# Patient Record
Sex: Female | Born: 1975 | Race: Black or African American | Hispanic: No | Marital: Single | Smoking: Never smoker
Health system: Southern US, Community
[De-identification: ages and names within clinical notes are randomized; demographics above are authoritative.]

## PROBLEM LIST (undated history)

## (undated) ENCOUNTER — Inpatient Hospital Stay (HOSPITAL_COMMUNITY): Payer: Self-pay

## (undated) DIAGNOSIS — D649 Anemia, unspecified: Secondary | ICD-10-CM

## (undated) DIAGNOSIS — R0600 Dyspnea, unspecified: Secondary | ICD-10-CM

## (undated) DIAGNOSIS — Z789 Other specified health status: Secondary | ICD-10-CM

## (undated) DIAGNOSIS — Z923 Personal history of irradiation: Secondary | ICD-10-CM

## (undated) DIAGNOSIS — Z803 Family history of malignant neoplasm of breast: Secondary | ICD-10-CM

## (undated) DIAGNOSIS — G629 Polyneuropathy, unspecified: Secondary | ICD-10-CM

## (undated) DIAGNOSIS — I10 Essential (primary) hypertension: Secondary | ICD-10-CM

## (undated) DIAGNOSIS — C801 Malignant (primary) neoplasm, unspecified: Secondary | ICD-10-CM

## (undated) HISTORY — DX: Personal history of irradiation: Z92.3

## (undated) HISTORY — DX: Family history of malignant neoplasm of breast: Z80.3

## (undated) HISTORY — PX: CHOLECYSTECTOMY: SHX55

---

## 1999-03-19 ENCOUNTER — Emergency Department (HOSPITAL_COMMUNITY): Admission: EM | Admit: 1999-03-19 | Discharge: 1999-03-19 | Payer: Self-pay | Admitting: Emergency Medicine

## 1999-04-15 ENCOUNTER — Encounter: Admission: RE | Admit: 1999-04-15 | Discharge: 1999-07-14 | Payer: Self-pay | Admitting: Family Medicine

## 1999-07-02 ENCOUNTER — Encounter: Payer: Self-pay | Admitting: Obstetrics & Gynecology

## 1999-07-02 ENCOUNTER — Ambulatory Visit (HOSPITAL_COMMUNITY): Admission: RE | Admit: 1999-07-02 | Discharge: 1999-07-02 | Payer: Self-pay | Admitting: Obstetrics & Gynecology

## 1999-09-18 ENCOUNTER — Ambulatory Visit (HOSPITAL_COMMUNITY): Admission: RE | Admit: 1999-09-18 | Discharge: 1999-09-18 | Payer: Self-pay | Admitting: Obstetrics and Gynecology

## 1999-09-18 ENCOUNTER — Encounter: Payer: Self-pay | Admitting: Obstetrics and Gynecology

## 1999-11-08 ENCOUNTER — Inpatient Hospital Stay (HOSPITAL_COMMUNITY): Admission: AD | Admit: 1999-11-08 | Discharge: 1999-11-08 | Payer: Self-pay | Admitting: Obstetrics and Gynecology

## 1999-11-18 ENCOUNTER — Inpatient Hospital Stay (HOSPITAL_COMMUNITY): Admission: AD | Admit: 1999-11-18 | Discharge: 1999-11-18 | Payer: Self-pay | Admitting: Obstetrics and Gynecology

## 1999-11-22 ENCOUNTER — Inpatient Hospital Stay (HOSPITAL_COMMUNITY): Admission: AD | Admit: 1999-11-22 | Discharge: 1999-11-24 | Payer: Self-pay | Admitting: Obstetrics and Gynecology

## 2000-07-19 ENCOUNTER — Emergency Department (HOSPITAL_COMMUNITY): Admission: EM | Admit: 2000-07-19 | Discharge: 2000-07-20 | Payer: Self-pay | Admitting: *Deleted

## 2000-07-20 ENCOUNTER — Encounter: Payer: Self-pay | Admitting: Emergency Medicine

## 2002-02-28 ENCOUNTER — Emergency Department (HOSPITAL_COMMUNITY): Admission: EM | Admit: 2002-02-28 | Discharge: 2002-02-28 | Payer: Self-pay | Admitting: *Deleted

## 2002-06-17 ENCOUNTER — Emergency Department (HOSPITAL_COMMUNITY): Admission: EM | Admit: 2002-06-17 | Discharge: 2002-06-18 | Payer: Self-pay | Admitting: Emergency Medicine

## 2002-06-17 ENCOUNTER — Encounter: Payer: Self-pay | Admitting: Emergency Medicine

## 2002-11-08 ENCOUNTER — Encounter: Admission: RE | Admit: 2002-11-08 | Discharge: 2002-11-08 | Payer: Self-pay | Admitting: Family Medicine

## 2002-11-08 ENCOUNTER — Encounter: Payer: Self-pay | Admitting: Family Medicine

## 2003-09-02 ENCOUNTER — Emergency Department (HOSPITAL_COMMUNITY): Admission: EM | Admit: 2003-09-02 | Discharge: 2003-09-02 | Payer: Self-pay | Admitting: Emergency Medicine

## 2003-09-05 ENCOUNTER — Emergency Department (HOSPITAL_COMMUNITY): Admission: EM | Admit: 2003-09-05 | Discharge: 2003-09-05 | Payer: Self-pay | Admitting: Emergency Medicine

## 2003-12-05 ENCOUNTER — Emergency Department (HOSPITAL_COMMUNITY): Admission: EM | Admit: 2003-12-05 | Discharge: 2003-12-06 | Payer: Self-pay | Admitting: Emergency Medicine

## 2003-12-08 ENCOUNTER — Emergency Department (HOSPITAL_COMMUNITY): Admission: EM | Admit: 2003-12-08 | Discharge: 2003-12-08 | Payer: Self-pay | Admitting: Emergency Medicine

## 2004-03-19 ENCOUNTER — Emergency Department (HOSPITAL_COMMUNITY): Admission: EM | Admit: 2004-03-19 | Discharge: 2004-03-19 | Payer: Self-pay | Admitting: Family Medicine

## 2004-03-19 ENCOUNTER — Inpatient Hospital Stay (HOSPITAL_COMMUNITY): Admission: AD | Admit: 2004-03-19 | Discharge: 2004-03-19 | Payer: Self-pay | Admitting: *Deleted

## 2004-06-14 ENCOUNTER — Inpatient Hospital Stay (HOSPITAL_COMMUNITY): Admission: EM | Admit: 2004-06-14 | Discharge: 2004-06-16 | Payer: Self-pay | Admitting: *Deleted

## 2004-06-15 ENCOUNTER — Ambulatory Visit: Payer: Self-pay | Admitting: Psychiatry

## 2004-06-16 ENCOUNTER — Emergency Department (HOSPITAL_COMMUNITY): Admission: EM | Admit: 2004-06-16 | Discharge: 2004-06-16 | Payer: Self-pay | Admitting: Emergency Medicine

## 2004-11-05 ENCOUNTER — Emergency Department (HOSPITAL_COMMUNITY): Admission: EM | Admit: 2004-11-05 | Discharge: 2004-11-06 | Payer: Self-pay | Admitting: *Deleted

## 2005-06-27 ENCOUNTER — Emergency Department (HOSPITAL_COMMUNITY): Admission: EM | Admit: 2005-06-27 | Discharge: 2005-06-28 | Payer: Self-pay | Admitting: Emergency Medicine

## 2005-07-13 ENCOUNTER — Emergency Department (HOSPITAL_COMMUNITY): Admission: EM | Admit: 2005-07-13 | Discharge: 2005-07-14 | Payer: Self-pay | Admitting: Emergency Medicine

## 2005-10-01 ENCOUNTER — Other Ambulatory Visit: Admission: RE | Admit: 2005-10-01 | Discharge: 2005-10-01 | Payer: Self-pay | Admitting: Obstetrics and Gynecology

## 2005-11-30 ENCOUNTER — Inpatient Hospital Stay (HOSPITAL_COMMUNITY): Admission: AD | Admit: 2005-11-30 | Discharge: 2005-11-30 | Payer: Self-pay | Admitting: Obstetrics and Gynecology

## 2006-05-17 ENCOUNTER — Emergency Department (HOSPITAL_COMMUNITY): Admission: EM | Admit: 2006-05-17 | Discharge: 2006-05-17 | Payer: Self-pay | Admitting: Emergency Medicine

## 2006-07-12 ENCOUNTER — Inpatient Hospital Stay (HOSPITAL_COMMUNITY): Admission: EM | Admit: 2006-07-12 | Discharge: 2006-07-17 | Payer: Self-pay | Admitting: Emergency Medicine

## 2006-07-14 ENCOUNTER — Encounter (INDEPENDENT_AMBULATORY_CARE_PROVIDER_SITE_OTHER): Payer: Self-pay | Admitting: Specialist

## 2006-08-10 ENCOUNTER — Emergency Department (HOSPITAL_COMMUNITY): Admission: EM | Admit: 2006-08-10 | Discharge: 2006-08-11 | Payer: Self-pay | Admitting: Emergency Medicine

## 2006-09-17 ENCOUNTER — Ambulatory Visit (HOSPITAL_COMMUNITY): Admission: RE | Admit: 2006-09-17 | Discharge: 2006-09-17 | Payer: Self-pay | Admitting: Gastroenterology

## 2007-05-23 ENCOUNTER — Encounter: Payer: Self-pay | Admitting: Emergency Medicine

## 2007-05-24 ENCOUNTER — Observation Stay (HOSPITAL_COMMUNITY): Admission: AD | Admit: 2007-05-24 | Discharge: 2007-05-24 | Payer: Self-pay | Admitting: Obstetrics and Gynecology

## 2007-06-16 ENCOUNTER — Inpatient Hospital Stay (HOSPITAL_COMMUNITY): Admission: AD | Admit: 2007-06-16 | Discharge: 2007-06-16 | Payer: Self-pay | Admitting: Obstetrics and Gynecology

## 2007-07-20 ENCOUNTER — Inpatient Hospital Stay (HOSPITAL_COMMUNITY): Admission: AD | Admit: 2007-07-20 | Discharge: 2007-07-20 | Payer: Self-pay | Admitting: Obstetrics and Gynecology

## 2007-08-08 ENCOUNTER — Inpatient Hospital Stay (HOSPITAL_COMMUNITY): Admission: AD | Admit: 2007-08-08 | Discharge: 2007-08-09 | Payer: Self-pay | Admitting: Obstetrics and Gynecology

## 2007-08-15 ENCOUNTER — Inpatient Hospital Stay (HOSPITAL_COMMUNITY): Admission: AD | Admit: 2007-08-15 | Discharge: 2007-08-16 | Payer: Self-pay | Admitting: Obstetrics and Gynecology

## 2008-09-12 ENCOUNTER — Emergency Department (HOSPITAL_COMMUNITY): Admission: EM | Admit: 2008-09-12 | Discharge: 2008-09-12 | Payer: Self-pay | Admitting: Emergency Medicine

## 2009-04-10 ENCOUNTER — Emergency Department (HOSPITAL_COMMUNITY): Admission: EM | Admit: 2009-04-10 | Discharge: 2009-04-11 | Payer: Self-pay | Admitting: Emergency Medicine

## 2009-11-07 ENCOUNTER — Inpatient Hospital Stay (HOSPITAL_COMMUNITY): Admission: AD | Admit: 2009-11-07 | Discharge: 2009-11-08 | Payer: Self-pay | Admitting: Obstetrics and Gynecology

## 2009-11-16 ENCOUNTER — Inpatient Hospital Stay (HOSPITAL_COMMUNITY): Admission: AD | Admit: 2009-11-16 | Discharge: 2009-11-16 | Payer: Self-pay | Admitting: Pediatrics

## 2009-12-01 ENCOUNTER — Inpatient Hospital Stay (HOSPITAL_COMMUNITY): Admission: AD | Admit: 2009-12-01 | Discharge: 2009-12-02 | Payer: Self-pay | Admitting: Obstetrics and Gynecology

## 2010-04-30 ENCOUNTER — Emergency Department (HOSPITAL_COMMUNITY)
Admission: EM | Admit: 2010-04-30 | Discharge: 2010-05-01 | Payer: Self-pay | Source: Home / Self Care | Admitting: Emergency Medicine

## 2010-06-20 ENCOUNTER — Inpatient Hospital Stay (HOSPITAL_COMMUNITY): Admission: AD | Admit: 2010-06-20 | Discharge: 2009-08-21 | Payer: Self-pay | Admitting: Obstetrics and Gynecology

## 2010-09-30 LAB — CBC
HCT: 33.8 % — ABNORMAL LOW (ref 36.0–46.0)
Hemoglobin: 11.4 g/dL — ABNORMAL LOW (ref 12.0–15.0)
MCHC: 33.9 g/dL (ref 30.0–36.0)
MCV: 91.7 fL (ref 78.0–100.0)
MCV: 92.5 fL (ref 78.0–100.0)
Platelets: 258 10*3/uL (ref 150–400)
RDW: 14.8 % (ref 11.5–15.5)
RDW: 15.1 % (ref 11.5–15.5)
WBC: 9.9 10*3/uL (ref 4.0–10.5)

## 2010-10-01 LAB — HERPES SIMPLEX VIRUS CULTURE

## 2010-10-01 LAB — WET PREP, GENITAL

## 2010-10-01 LAB — GC/CHLAMYDIA PROBE AMP, GENITAL: Chlamydia, DNA Probe: NEGATIVE

## 2010-10-18 LAB — URINE MICROSCOPIC-ADD ON

## 2010-10-18 LAB — LIPASE, BLOOD: Lipase: 30 U/L (ref 11–59)

## 2010-10-18 LAB — DIFFERENTIAL
Basophils Absolute: 0 10*3/uL (ref 0.0–0.1)
Eosinophils Relative: 1 % (ref 0–5)
Lymphocytes Relative: 45 % (ref 12–46)
Neutrophils Relative %: 48 % (ref 43–77)

## 2010-10-18 LAB — COMPREHENSIVE METABOLIC PANEL
AST: 19 U/L (ref 0–37)
Albumin: 3.3 g/dL — ABNORMAL LOW (ref 3.5–5.2)
CO2: 27 mEq/L (ref 19–32)
Chloride: 109 mEq/L (ref 96–112)
GFR calc Af Amer: >60 mL/min (ref >60)
Glucose, Bld: 98 mg/dL (ref 70–99)
Potassium: 3.8 mEq/L (ref 3.5–5.1)

## 2010-10-18 LAB — URINALYSIS, ROUTINE W REFLEX MICROSCOPIC
Bilirubin Urine: NEGATIVE
Ketones, ur: NEGATIVE mg/dL
Nitrite: NEGATIVE
Specific Gravity, Urine: 1.027 (ref 1.005–1.030)
pH: 5.5 (ref 5.0–8.0)

## 2010-10-18 LAB — CBC
HCT: 36.6 % (ref 36.0–46.0)
Hemoglobin: 12 g/dL (ref 12.0–15.0)
RDW: 13.2 % (ref 11.5–15.5)
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
## 2010-11-26 NOTE — Discharge Summary (Signed)
NAMEPARILEE, HALLY                 ACCOUNT NO.:  [id number]   MEDICAL RECORD NO.:  [id number]          PATIENT TYPE:  INP   LOCATION:  9173                          [facility]   PHYSICIAN:  Hal Morales, M.D.DATE OF BIRTH:  [birth year]   DATE OF ADMISSION:  08/08/2007  DATE OF DISCHARGE:  08/09/2007                               DISCHARGE SUMMARY   ADMISSION DIAGNOSES:  1. Intrauterine pregnancy at 37 weeks.  2. Questionable early labor.  3. Negative group B strep.   DISCHARGE DIAGNOSES:  1. Prodromal labor.  2. Lack cervical change.   PROCEDURES:  1. Pitocin augmentation.  2. Epidural anesthesia.   HOSPITAL COURSE:  Ms. Lars Pinks is a 35 year old gravida 4, para 2-0-1-2 at  51 weeks who presented on the evening of August 08, 2007 with uterine  contractions every 3 minutes.  She had lots of vomiting positive fetal  movement.  She denied any headache, visual symptoms, epigastric pain.  Her pregnancy had been remarkable for:  1. Elevated BMI.  2. History of questionable glucose instability before pregnancy but      normal glucose testing with pregnancy.  3. Group B strep negative.   On admission, cervix was 2, 70% vertex and -2 station with contractions  every 2 minutes.  The patient was very uncomfortable, heart rate was  reactive.  Decision was made to admit for labor care.  The patient did  request an epidural, at this time this was placed.  Her cervix was 2-3,  70% vertex and -2 station.  Epidural was placed for comfort.  Subsequently placing epidural, uterine contractions began to diminished  somewhat in quality.  Throughout the night, the patient was followed  closely.  Pitocin augmentation was continued through the night.  She did  not progress past the 2-3 cm, 70% vertex and -2 station, and the fetal  vertex was not well enough applied to allow for artificial rupture.  The  morning of January 26, cervix remained the same with vertex now at -3.  Fetal heart rate  was reactive.  Decision was made to continue Pitocin  and to try to rupture membranes when vertex is better applied.  The rest  of the morning, Pitocin had been up to a max of 18 mA of Pitocin.  The  recommendation was made for Dr. Pennie Rushing to discontinue the Pitocin  epidural secondary no change in labor progress, no evidence of labor and  no evidence of progression of labor in light of 37-1/7 weeks situation.  The patient was disappointed with this, she did understand that her two  previous labors did not require this much time.  The Foley was  discontinued, the epidural was stopped and Pitocin was also stopped.  By  the late afternoon, the patient was having very seldom contractions.  Cervix was still was same at 2-1/2, 60-70% vertex, -2 station.  Uterine  contractions every 3-7 minutes with some irritability, but mild.  Fetal  heart rate was reassuring.  Decision was made to discontinue the Foley  and discharge the patient home.  She ended up leaving  approximately 6:00  p.m.  She had no voided spontaneously at that time.  However, the  patient did prefer to go home.  She was given instructions to call if  she had no spontaneous voids by this evening.  Again, support was given  to the patient for her lack of progress, and the decision to discharge  the patient was reviewed again with her in light of the risks of trying  to force labor when the cervix was obviously not responding, and the  patient was at 37-1/7 weeks.   DISCHARGE INSTRUCTIONS:  The patient is to continue to monitor for signs  and symptoms of increasing labor, rupture of membranes, vaginal bleeding  or decreased fetal movement.   DISCHARGE MEDICATIONS:  Flexeril 10 mg p.o. q.8 h p.r.n. back pain.  Patient also had Ambien already fused.  Discharge follow-up will occur  of course on p.r.n. basis but the patient also has an appointment  scheduled this Thursday in the office at noon.      Renaldo Reel Emilee Hero,  C.N.M.      Hal Morales, M.D.  Electronically Signed    VLL/MEDQ  D:  08/09/2007  T:  08/10/2007  Job:  [id number]
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
## 2010-11-26 NOTE — H&P (Signed)
Amber White                 ACCOUNT NO.:  [id number]   MEDICAL RECORD NO.:  [id number]          PATIENT TYPE:  INP   LOCATION:  9113                          [facility]  [facility]   PHYSICIAN:  Hal Morales, M.D.DATE OF BIRTH:  12/28/1975   DATE OF ADMISSION:  08/15/2007  DATE OF DISCHARGE:                              HISTORY & PHYSICAL   [name] is a 35 year old, single, black female, gravida 4, para 2-0-1-  2 at 37-6/7th's weeks who presents with regular uterine contractions  every 3 minutes.  She reports a small amount of bloody show.  No  leaking.  No headache.   Her pregnancy has been followed by the [facility]  Certified Nurse Midwife Service and has been remarkable for:  1. Obesity.  2. Mother with breast cancer.  3. Group B strep negative.   PRENATAL LABORATORY:  Were collected on February 02, 2007.  Hemoglobin 11.6,  hematocrit 35.7, platelets 374,000.  Blood type O positive.  Antibody  negative.  Sickle cell trait negative.  RPR nonreactive.  Rubella  immune.  Hepatitis B surface antigen negative.  HIV nonreactive.  Gonorrhea negative.  Chlamydia negative.  One hour Glucola on March 11, 2007, was 147.  Three-hour glucose tolerance test from April 19, 2007,  was within normal limits.  One hour Glucola from May 25, 2007 was  157.  RPR at that time was nonreactive.  Three-hour glucose tolerance  test from June 19, 2007, was within normal limits.  Culture of the  vaginal tract for Group B strep, Gonorrhea, and Chlamydia from July 14, 2007 were all negative.   HISTORY OF PRESENT PREGNANCY:  The patient presented for care at [facility] on February 02, 2007 at 10-2/7th's weeks' gestation.  The patient  had a normal first trimester screening.  She had an elevated early one  hour Glucola and a normal three-hour glucose tolerance test to follow.  Anatomy ultrasound at 19 weeks' gestation shows growth consistent with  previous dating,  confirming an EDC of August 30, 2007.  The patient  was evaluated at 25-1/2 weeks for pressure and pain.  She had a negative  fetal fibronectin, was treated for bacterial vaginosis, and she also had  a urinary tract infection that she was treated for at that visit.  The  patient had a repeat one hour Glucola at 27 weeks that was elevated as  well, followed by a normal three-hour glucose tolerance test.  The rest  of her prenatal care has been unremarkable.   OBSTETRICAL HISTORY:  She is gravida 4, para 2-0-1-2:  1. In January 1996, she had a vaginal delivery of a female infant      weighing 7 pounds and 1 ounce at 40 weeks' gestation after greater      than 24 hours of labor.  She had an epidural for anesthesia.  There      were no complications with that pregnancy or birth.  The infant's      name is Amber White.  2. In May 2001, she had a vaginal delivery of  a female infant weighing      6 pounds and 14 ounces at 38 weeks' gestation after 12 hours of      labor.  She had an epidural for anesthesia.  The infant's name was      Amber White.  There were no complications with that pregnancy or birth.  3. In 2006, she had a spontaneous AB.   PAST MEDICAL HISTORY:  1. She has an allergy to PENICILLIN.  2. She experienced menarche at age 74 with 28-day cycles lasting 3      days.  3. She has used Seasonale and Femcon in the past for contraception.  4. She reports having had the usual childhood illnesses.  5. The patient was diagnosed with type 2 diabetes and takes Metformin      every day until the pregnancy.   PAST SURGICAL HISTORY:  Cholecystectomy in January 2008.   FAMILY MEDICAL HISTORY:  Maternal grandmother with varicose veins.  Maternal grandmother with anemia.  The patient's first cousin on  dialysis.  The patient's mother with breast cancer, currently in  remission.   GENETIC HISTORY:  Remarkable for the patient's mother with cystic  fibrosis.  Maternal aunt with mental  retardation.  Father of the baby's  brother with sickle cell trait.   SOCIAL HISTORY:  The patient is separated.  Father of the baby's name is  Amber White.  The patient has some college education and is employed full  time as a Science writer.  Father of the baby is high school educated and  employed full time as a Estate agent.  They deny any alcohol,  tobacco, or illicit drug use with the pregnancy.   OBJECTIVE DATA:  VITAL SIGNS:  Stable.  She is afebrile.  HEENT:  Grossly within normal limits.  CHEST:  Clear to auscultation.  HEART:  Regular rate and rhythm.  ABDOMEN:  Gravid in contour with fundal height extending approximately  38-cm above the pubic symphysis.  Fetal heart rate is reactive and  reassuring.  Contractions are every 2-4 minutes, lasting 40 seconds.  PELVIC:  Cervix has progressed from 4 to 5-cm, 80% effaced, vertex, -3  and vertex was confirmed by bedside ultrasound.  EXTREMITIES:  Within normal limits.   ASSESSMENT:  1. Intrauterine pregnancy at 37-6/7th's weeks.  2. Early active labor.   PLAN:  1. Admit to birthing suite.  2. Routine CNM orders.  3. The patient plans epidural for labor.  4. Anticipate spontaneous vaginal birth.      Amber White, C.N.M.      Hal Morales, M.D.  Electronically Signed    KS/MEDQ  D:  08/15/2007  T:  08/15/2007  Job:  [id number]

## 2010-11-29 NOTE — Op Note (Signed)
NAMEANSELMA, White                 ACCOUNT NO.:  [id number]   MEDICAL RECORD NO.:  [id number]          PATIENT TYPE:  INP   LOCATION:  1606                         FACILITY:  [facility]   PHYSICIAN:  Amber White. Amber White, M.D.DATE OF BIRTH:  [birth year]   DATE OF PROCEDURE:  07/14/2006  DATE OF DISCHARGE:                               OPERATIVE REPORT   PREOPERATIVE DIAGNOSIS:  Chronic cholecystitis with probable passage of  common duct stone.   POSTOPERATIVE DIAGNOSIS:  Subacute cholecystitis with many common duct  stones.   PROCEDURE:  Laparoscopic cholecystectomy with cholangiogram, removal of  many stones from the common bile duct, but the patient will need an ERCP  tomorrow.   SURGEON:  Amber White. Amber White, M.D.   ASSISTANT:  Amber White. Amber White, M.D.   HISTORY:  Amber White is a 35 year old overweight female who presented  to the emergency room last evening and was seen by Amber White,  M.D. after she had an ultrasound that showed stones within her  gallbladder that was thought not to be acutely inflamed and her liver  function tests were mildly abnormal.  This morning she felt better.  Her  SGOT and SGPT which were approximately 170 and 140 range yesterday, were  mildly improved.  Her bilirubin that was elevated to 1.7 was done.  Amber  C. Madilyn White, M.D. saw her this morning and questioned whether she needed a  preoperative ERCP or whether to proceed with a laparoscopic  cholecystectomy.  I saw her and she was completely asymptomatic, was  hungry.  They obtained a CT this morning and this showed gallstones with  a distended gallbladder and no specific evidence of cholecystitis and  intra and extrahepatic ductal dilatation without a definite cause.  They  could not see any definite stones in the dilated common bile duct, but  they thought the pancreas looked remarkable.  I recommended we proceed  on to the OR for laparoscopic cholecystectomy.  The patient was in  agreement.  She was not febrile and her white count was normal.  She was  given 400 mg of Cipro preoperatively.  The patient is extremely awake  and has type 2 diabetes, but her sugar was not elevated when she  presented to the ER.   DESCRIPTION OF PROCEDURE:  The patient was positioned on the OR table.  She does have PAS stockings.  We did an EKG preoperatively that was  unremarkable and she was kind of hanging over the OR table even though  she was on the bariatric table because of her size.  The abdomen, after  induction of general anesthesia, was prepped with Betadine solution and  draped in a sterile manner.  A small incision was made below the  umbilicus with sharp dissection down through the subcutaneous tissue  identifying the fascia.  This was then picked up between two Kochers and  a small opening made through the anterior thought midline fascia to the  underlying rectus muscle.  It appears that we were to the slight right  of midline and then the posterior rectus fascia was picked  up between  two Hemostats and a small opening made into the peritoneal cavity.  A  pursestring suture was placed.  I then actually put a stitch on the  peritoneum so we could pull it up at the completion for the fascia  closure.  Hasson cannula was introduced.  Carbon dioxide was inserted,  and then the scope was inserted.  We could see the gallbladder.  It was  very tense, but not acutely inflamed.  The upper 10 mm trocar was placed  under direct vision at the appropriate midline position after  anesthetizing the fascia and Dr. Janee White, the assistant, placed the two  lateral 5 mm ports at their appropriate positions.  She does have an  enlarged liver, but it does not look fatty.  We then grasped the  gallbladder and retracted it upward.  She has adhesions around it  proximally and we kind of carefully dissected these and the gallbladder  was so distended, it was kind of a variation of a __________  syndrome  because of the pressure that it is placing on the bile duct.  We  carefully opened up the peritoneum, identified a little peritoneal fold  anterior, opened this, and then I could get around the cystic duct.  I  was impressed on how wide the cystic duct was and was kind of tempted to  go ahead and do the cholangiogram, wondering if it could possibly  somehow common bile duct.  We continued freeing up the peritoneum under  direct vision and then could then pull it out and could see a big window  behind it and we were sure of our anatomy.  I then put two clips where  this cystic duct gallbladder joins.  A small opening was made just  proximal.  Bile which appeared to be under pressure did come forth.  Cook catheter was introduced and held in place with a clip and then the  catheter was injected.  There were many stones in the common bile duct  intrahepatic radicals, etc., And it looks like we probably about an inch  length of cystic duct.  I did a second injection, kind of visualized it  and it had to be better, I could see definitely there was flow and dye  going into the common bile duct.  Then we removed the catheter.  I kind  of opened the little opening a little larger and we probably got 25  stones out of the cystic duct that were just popping out because of the  pressure.  After these were removed, either the __________ sucker or the  pooper scooper.  We then placed two clips across the cystic duct.  The  cystic duct was wide  enough that I thought an Endoloop was needed and a  Vicryl Endoloop was placed just under the two clips and then snugged  down.  We divided the cystic duct before the Endoloop was placed and  then could see the definite cystic artery.  Two clips were placed  proximally and one distally and divided and then using the hook  electrocautery removed the inflamed gallbladder from its bed.  Good hemostasis was obtained.  We placed the gallbladder within an  EndoCatch  bag, switched the camera to the upper 10 mm port and pulled the neck of  the bag into the Hasson cannula.  We then kind of opened the bag, pulled  up the neck of the gallbladder into the opening, opened the gallbladder  and I am sure there were more than 500 stones in the gallbladder of  various sizes from about 2-3 mm to some 5-6 mm.  After the stones were  kind of removed we could bring the EndoCatch bag out without opening the  fascia.  I then closed the fascia with two figure-of-eight sutures of 0  Vicryl in addition to the pursestring we had placed.  We then  anesthetized the fascia.  Next the gallbladder area was irrigated and  there was no bile spillage.  We could not see any other stones floating.  They were all cholesterol stones.  Then we removed all of the irrigating  fluid.  The 5 mm ports were removed under direct vision.  We then  released the carbon dioxide and removed the upper 10 mm trocar under  direct vision.  The patient will need an ERCP tomorrow and a  sphincterotomy to let the remaining stones pass and I will contact Dr.  Madilyn White who is the [facility] who admitted her.           ______________________________  Amber White. Amber White, M.D.     WJW/MEDQ  D:  07/14/2006  T:  07/15/2006  Job:  [id number]   cc:   Everardo All. Madilyn White, M.D.  Fax: [phone]
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
## 2010-11-29 NOTE — Discharge Summary (Signed)
[name], Amber White                 ACCOUNT NO.:  [id number]   MEDICAL RECORD NO.:  [id number]          PATIENT TYPE:  INP   LOCATION:  1606                         [facility]ACILITY:  [facility]   PHYSICIAN:  James L. Malon Kindle., M.D.DATE OF BIRTH:  1976-05-04   DATE OF ADMISSION:  07/12/2006  DATE OF DISCHARGE:                               DISCHARGE SUMMARY   ADMISSION DIAGNOSES:  1. Abdominal pain.  2. Elevated liver tests.   FINAL DIAGNOSIS:  Cholecystitis with common duct stones.   PROCEDURES:  1. Laparoscopic cholecystectomy with cholangiogram with the finding of      multiple common bile duct stones on January 1 by Dr. [name].  2. ERCP with endoscopic sphincterotomy and stent placement by Dr. Dorena Cookey.  Common duct stones were not able to extracted for unclear      reasons.   HISTORY:  The patient is a 35 year old African-American female who  presented with right upper quadrant pain and slightly elevated liver  tests, the symptoms had been going on for several days prior to her  presenting to the emergency room.  Her evaluation after being admitted  included an ultrasound showing cholelithiasis with ductal dilatation and  hepatic steatosis.  This led to abdominal CT showing cholelithiasis with  suggestion of choledocholithiasis.  The patient went onto laparoscopic  cholecystectomy by Dr. Consuello Bossier which revealed a gallbladder  with stones and multiple common duct stones on intraoperative  cholangiogram.  She subsequently underwent an ERCP with sphincterotomy  by Dr. Dorena Cookey the following day, a large sphincterotomy was placed  but for unclear reasons the stones could not be removed.  The patient  had a stent placed.   The following morning she was still a bit nauseated, we watched her one  more day and advanced her diet.  On the day of discharge she was  markedly improved, tolerating a diet with minimal symptoms and was  afebrile.   DISPOSITION:  The patient  is discharged home on her home medications of  metformin 500 mg daily, omeprazole 40 mg daily, Reglan 10 mg q.i.d.  She  will also be given Ursodiol 300 mg b.i.d. in hopes of dissolving her  gallstones, or softening them up for future removal, and will be given  Tramadol for pain.  She will see Dr. Dorena Cookey in the office in two  weeks, see Dr. Consuello Bossier in the office in two weeks.   Her condition is much improved.  She has been instructed to call  immediately for fever, pain, or any signs of cholangitis.           ______________________________  Llana Aliment. Malon Kindle., M.D.     Waldron Session  D:  07/17/2006  T:  07/17/2006  Job:  [id number]   cc:   Renaye Rakers, M.D.  Fax: [phone]   [name]. [name], M.D.  [address]  [place]  [place] [address]

## 2010-11-29 NOTE — H&P (Signed)
NAMEDANIE, Amber White                 ACCOUNT NO.:  [id number]   MEDICAL RECORD NO.:  [id number]          PATIENT TYPE:  INP   LOCATION:  1606                         [facility]ACILITY[facility]  [facility]   PHYSICIAN:  Shirley Friar, MDDATE OF BIRTH:  08-24-1975   DATE OF ADMISSION:  07/12/2006  DATE OF DISCHARGE:                              HISTORY & PHYSICAL   REQUESTING PHYSICIAN:  Trudi Ida. Denton Lank, M.D.   ADMISSION DIAGNOSIS:  Biliary colic, abdominal pain.   HISTORY OF PRESENT ILLNESS:  A 35 year old black female presented to the  ER with a 1 month history of intermittent epigastric abdominal pain that  radiates to her back and is described as dull.  Initially her pain was  diagnosed as reflux and she was given a proton pump inhibitor without  any benefit.  Last night and today, she had worsened abdominal pain  along with nausea, vomiting and chills and came in for further  evaluation.  She denies any fevers.  She does acknowledge that she was  diagnosed with diabetes 3 months ago and has been treated with metformin  for that.  She had an ultrasound done in the ER which showed gallstones,  sludge and mild distension of the gallbladder with intra and hepatic  biliary dilatation, but no obvious stone in the common bile duct. Her  total bilirubin level was 1.7, ALP 150, AST 177, ALT 140.  She had  normal amylase and lipase at 78 and 38  respectively.  She is afebrile  on presentation with a normal white blood count.  While in the emergency  department, she was treated with IV fluids, pain medicines and  antiemetics but she is being admitted due to recurrent abdominal pain  and vomiting.   PAST MEDICAL HISTORY:  1. Diabetes mellitus type 2.  2. Morbid obesity.  3. History of GERD.   MEDICINES:  1. Metformin 500 mg daily.  2. Omeprazole 40 mg daily.  3. Reglan 10 mg q.i.d.   ALLERGIES:  PENICILLIN causes hives.   FAMILY HISTORY:  Positive for gallstones in her mother.   SOCIAL HISTORY:   She denies cigarettes or alcohol.   REVIEW OF SYSTEMS:  Negative except as stated above.   PHYSICAL EXAM:  VITAL SIGNS:  Temperature 98.4, pulse 92, blood pressure  132/94, O2 sat 97% on room air.  GENERAL:  Alert in no acute distress.  HEENT:  Nonicteric sclerae.  CHEST:  Clear to auscultation anteriorly.  CARDIOVASCULAR:  Regular rate and rhythm.  ABDOMEN:  Epigastric tenderness and right upper quadrant tenderness with  guarding, no rebound, otherwise nontender, soft, nondistended, positive  bowel sounds.  EXTREMITIES:  No edema.   IMPRESSION:  A 35 year old black female presenting with a 1 month  history of intermittent epigastric abdominal pain and recent onset of  nausea, vomiting and chills.  Her history is concerning for common bile  duct stone, versus biliary colic, versus Mirizzi's syndrome. No definite  evidence of acute cholecystitis on her ultrasound.  Will admit the  patient for pain control, antiemetic therapy and IV hydration.  Will  watch her LFTs  closely to decide whether she needs to have ERCP versus  further noninvasive imaging.  If her liver function tests continue to  rise or if she develops fever or leukocytosis then will need to do an  ERCP.  Otherwise will probably need additional imaging by MRCP or CT  scanning to look for a common bile duct stone.  We will give her Zofran  as needed for her nausea and vomiting and morphine as needed for pain.      Shirley Friar, MD  Electronically Signed     VCS/MEDQ  D:  07/12/2006  T:  07/13/2006  Job:  [id number]   cc:   Renaye Rakers, M.D.  Fax: [phone]

## 2010-11-29 NOTE — H&P (Signed)
[name], [name]                 ACCOUNT NO.:  [id number]   MEDICAL RECORD NO.:  [id number]          PATIENT TYPE:  INP   LOCATION:  1606                         [facility]ACILITY:  [facility]   PHYSICIAN:  [name]. Weatherly, M.D.DATE OF BIRTH:  [birth year]   DATE OF ADMISSION:  07/12/2006  DATE OF DISCHARGE:                              HISTORY & PHYSICAL   CHIEF COMPLAINT:  Abdominal pain right upper quadrant.   HISTORY OF PRESENT ILLNESS:  Amber White is a 35 year old overweight  black female who was admitted through the emergency room on the 30th  with recurrent episodes of abdominal pain, epigastric right upper  quadrant.  At this time she had had pain radiating to the chest and  back.  I think she was seen approximately 10 o'clock by Dr. Doy Mince, who was on call for gastroenterology who was consulted.  The  patient had had an ultrasound that showed stones.  She is a type 2  diabetic and overweight, is on oral medications and her liver function  studies in the emergency room showed normal amylase and lipase, her SGOT  and SGPT of 177 and 140.  Her bilirubin was mildly elevated at 1.7.  Her  white count was 6200 and a hematocrit of 37.  Her glucose in the  emergency room was 97 and because of mildly elevated liver function  studies, the gastroenterologists were consulted.  She was admitted by  Dr. Bosie Clos and this morning Dr. Madilyn Fireman saw her and he, with repeat  liver function studies which are now improved and also the patient's  pain appears to have subsided, questioned whether she needed the ERCP or  whether she just needed a laparoscopic cholecystectomy and then  cholangiogram and then an ERCP if there would be abnormalities or stones  in the common bile duct.  He contacted me and I was definitely in  agreement with him that this is most likely a small stones; probably she  has passed a small common duct stone but I would do a cholangiogram at  surgery and not schedule for an  ERCP tomorrow in this improving  condition and improving liver function studies.  The patient is  definitely in agreement that she would like to go ahead and proceed with  surgery.  The risk of ERCP and etc. have been explained to her and  unless it is absolutely necessary, she would prefer not having that  procedure.   On physical exam now she is not acutely tender in the right upper  quadrant.  She is overweight.  She has been n.p.o.  She has been  afebrile the past 24 hours and arrangements will be made to add her to  the OR schedule for this afternoon and we will do a cholangiogram.  She  is presently not on antibiotics.  She is allergic to penicillin, that  causes hives, and we will give her on Cipro on call to the operating  room.  She had a CT this morning since there was some question whether  there was prominence in the head the pancreas.  I think this was ruled  out with the CAT scan of the abdomen and she has now been n.p.o. after  the oral contrast for three or four hours.  The patient understands that  we are planning to do a laparoscopic cholecystectomy, about the  incisions.  We will do a cholangiogram and we will add her onto the OR  schedule for this afternoon and since she is n.p.o.   IMPRESSION:  Chronic cholecystitis with stones, possible passage of a  small common duct stone, morbid obesity and type 2 diabetes.  She also  has a history of esophageal reflux.           ______________________________  [name]. [name], M.D.     WJW/MEDQ  D:  07/14/2006  T:  07/14/2006  Job:  [id number]

## 2010-11-29 NOTE — Op Note (Signed)
NAMESUMMERLYN, FICKEL                 ACCOUNT NO.:  [id number]   MEDICAL RECORD NO.:  [id number]          PATIENT TYPE:  AMB   LOCATION:  ENDO                         FACILITY:  [facility]   PHYSICIAN:  [name] Madilyn Fireman, M.D.    DATE OF BIRTH:  10-12-1975   DATE OF PROCEDURE:  09/17/2006  DATE OF DISCHARGE:                               OPERATIVE REPORT   PROCEDURE:  Endoscopic retrograde cholangiopancreatography with stent  removal, sphincterotomy, and stone extraction.   INDICATIONS FOR PROCEDURE:  The patient with known multiple common bile  duct stones after cholecystectomy.  ERCP done post cholecystectomy three  months ago was unsuccessful in extracting the stones due to multiple  stones and fairly large size.  Stent was placed.  Procedure is to remove  the stent and attempt definitive removal of the common bile duct stones.   PROCEDURE:  The patient was placed in the prone position and placed on  the pulse monitor with continuous low-flow oxygen delivered by nasal  cannula.  She was sedated with 190 mcg of IV Fentanyl and 19 mg IV  Versed.  The Pentax side-viewing endoscope was advanced blindly into the  oropharynx, esophagus, and stomach.  The stomach was somewhat J-shaped,  and it was difficult passing the scope down into the interim and through  the pylorus, but this was eventually accomplished.  The stomach appeared  grossly normal with a moderate amount of bile seen in the stomach.  The  papilla of Vater was located on the medial duodenal wall.  There is the  previously placed plastic stent extending from it.  This was grasped  with a snare and pulled up through the scope.  A cholangiogram was  performed which showed a few mid common bile duct stones with a distal  duct poorly opacifying.  Her sphincterotomy was significantly enlarged,  and the adjustable balloon catheter was advanced first only a short  distance into the duct and inflated dragging out two or three smooth  gold-colored stones.  Two or three more passed spontaneously at that  point.  The balloon catheter was reintroduced multiple times going  slightly higher each time and continuing to remove multiple stones.  On  approximately 15 passes, it was estimated that probably 20 to 25 stones  were eventually removed.  Occlusion of cholangiogram, better  opacification of the distal duct was obtained.  We continued to inflate  the balloon and drag it through the ampulla until no further stones were  seen to pass on three consecutive attempts.  There were still some  questionable filling defects at that point, but it was felt at this  point they were most likely air bubbles.  The scope was then withdrawn,  and the patient returned to the recovery room in stable condition.  She  tolerated the procedure well, and there were no immediate complications.   IMPRESSION:  Multiple common bile duct stones removed after  sphincterotomy.           ______________________________  Everardo All Madilyn Fireman, M.D.     JCH/MEDQ  D:  09/17/2006  T:  09/18/2006  Job:  [id number]   cc:   [name]. [name], M.D.  Renaye Rakers, M.D.

## 2010-11-29 NOTE — Op Note (Signed)
Amber White, Amber White                 ACCOUNT NO.:  [id number]   MEDICAL RECORD NO.:  [id number]          PATIENT TYPE:  INP   LOCATION:  1606                         FACILITY:  [facility]   PHYSICIAN:  [name] Madilyn Fireman, M.D.    DATE OF BIRTH:  June 11, 1976   DATE OF PROCEDURE:  07/15/2006  DATE OF DISCHARGE:                               OPERATIVE REPORT   PROCEDURE:  Endoscopic retrograde cholangiopancreatography with  sphincterotomy and stent placement.   INDICATIONS FOR PROCEDURE:  Biliary colic with laparoscopic  cholecystectomy showing multiple gallstones, cystic duct and common bile  duct stones.   DESCRIPTION OF PROCEDURE:  The patient was placed in the left lateral  decubitus position and placed on the pulse monitor with continuous low-  flow oxygen delivered by nasal cannula.  She was sedated with 150 mcg IV  fentanyl and 15 mg IV Versed.  The Olympus video side-viewing endoscope  was advanced blindly into the oropharynx, esophagus, and stomach.  The  stomach was grossly normal with the pylorus traversing and the papilla  Vater located on the medial duodenal wall.  It had a somewhat generous  appearance; and was selectively cannulated with the Wilson-Cook  sphincterotome.  A cholangiogram was obtained.  This showed moderately  dilated, inter-and-extra hepatic ducts with multiple stones apparently  in the cystic duct as well.   A very large sphincterotomy was made.  After that the graded 9-15 mm  balloon catheter was advanced to various levels in the duct and  inflated; there seemed to be an area of resistance somewhat near the  cystic duct takeoff where I could not pull the balloon through the  common bile duct, even when only inflated to 9 mm.  This was persistent  over multiple attempts; and, thus, I could not pull any stones down from  the common hepatic duct or above.  I was able to inflate the balloon to  about 10-12 mm distal to the cystic duct takeoff; and there appeared to  be stones distal to that area too; however, they would not pass with the  balloon; and the balloon would not come through the duct unless deflated  to about 6 mm.  This was tired on multiple occasions; and on several  occasions the wire fell out of the duct; and I could not regain access  with the balloon catheter alone, requiring repeated reintroduction of  the sphincterotome to regain access.  Finally after multiple  unsuccessful attempts to deliver stones, I elected to place a 5-cm, 10-  [place] plastic catheter; and this was done with good positioning under  fluoroscopy.  The scope was then withdrawn and the patient returned to  the recovery room in stable condition.  She tolerated the procedure  well; and there were no immediate complications.   IMPRESSION:  1. 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  2. There may be some component of common bile duct stricturing,      limiting the success of today's procedure.   PLAN:  We  will keep stent in place for 2-4 months, consider a trial of  Actigall and repeat ERCP with attempted definitive removal of the  stones.           ______________________________  Everardo All Madilyn Fireman, M.D.     [facility]/MEDQ  D:  07/15/2006  T:  07/15/2006  Job:  [id number]   cc:   [name]. [name], M.D.  [address]  [place]  [place] [address]

## 2011-01-28 ENCOUNTER — Emergency Department (HOSPITAL_COMMUNITY)
Admission: EM | Admit: 2011-01-28 | Discharge: 2011-01-28 | Disposition: A | Discharge status: Home / Self Care | Payer: No Typology Code available for payment source | Attending: Emergency Medicine | Admitting: Emergency Medicine

## 2011-01-28 DIAGNOSIS — S335XXA Sprain of ligaments of lumbar spine, initial encounter: Secondary | ICD-10-CM | POA: Insufficient documentation

## 2011-01-28 DIAGNOSIS — M545 Low back pain, unspecified: Secondary | ICD-10-CM | POA: Insufficient documentation

## 2011-01-31 ENCOUNTER — Inpatient Hospital Stay (INDEPENDENT_AMBULATORY_CARE_PROVIDER_SITE_OTHER)
Admission: RE | Admit: 2011-01-31 | Discharge: 2011-01-31 | Disposition: A | Discharge status: Home / Self Care | Payer: No Typology Code available for payment source | Source: Ambulatory Visit | Attending: Family Medicine | Admitting: Family Medicine

## 2011-01-31 DIAGNOSIS — S139XXA Sprain of joints and ligaments of unspecified parts of neck, initial encounter: Secondary | ICD-10-CM

## 2011-01-31 DIAGNOSIS — S335XXA Sprain of ligaments of lumbar spine, initial encounter: Secondary | ICD-10-CM

## 2011-02-09 ENCOUNTER — Inpatient Hospital Stay (INDEPENDENT_AMBULATORY_CARE_PROVIDER_SITE_OTHER)
Admission: RE | Admit: 2011-02-09 | Discharge: 2011-02-09 | Disposition: A | Discharge status: Home / Self Care | Payer: Self-pay | Source: Ambulatory Visit | Attending: Family Medicine | Admitting: Family Medicine

## 2011-02-09 DIAGNOSIS — L089 Local infection of the skin and subcutaneous tissue, unspecified: Secondary | ICD-10-CM

## 2011-02-09 DIAGNOSIS — L732 Hidradenitis suppurativa: Secondary | ICD-10-CM

## 2011-02-12 LAB — CULTURE, ROUTINE-ABSCESS

## 2011-02-18 ENCOUNTER — Encounter (HOSPITAL_COMMUNITY): Payer: Self-pay | Admitting: *Deleted

## 2011-02-18 ENCOUNTER — Inpatient Hospital Stay (HOSPITAL_COMMUNITY)
Admission: AD | Admit: 2011-02-18 | Discharge: 2011-02-18 | Disposition: A | Discharge status: Home / Self Care | Payer: Self-pay | Source: Ambulatory Visit | Attending: Family Medicine | Admitting: Family Medicine

## 2011-02-18 DIAGNOSIS — N390 Urinary tract infection, site not specified: Secondary | ICD-10-CM | POA: Insufficient documentation

## 2011-02-18 DIAGNOSIS — A599 Trichomoniasis, unspecified: Secondary | ICD-10-CM

## 2011-02-18 DIAGNOSIS — R300 Dysuria: Secondary | ICD-10-CM | POA: Insufficient documentation

## 2011-02-18 DIAGNOSIS — A5901 Trichomonal vulvovaginitis: Secondary | ICD-10-CM | POA: Insufficient documentation

## 2011-02-18 HISTORY — DX: Other specified health status: Z78.9

## 2011-02-18 LAB — URINE MICROSCOPIC-ADD ON

## 2011-02-18 LAB — URINALYSIS, ROUTINE W REFLEX MICROSCOPIC
Glucose, UA: NEGATIVE mg/dL
Hgb urine dipstick: NEGATIVE
Protein, ur: NEGATIVE mg/dL
Specific Gravity, Urine: 1.025 (ref 1.005–1.030)

## 2011-02-18 LAB — POCT PREGNANCY, URINE: Preg Test, Ur: NEGATIVE

## 2011-02-18 MED ORDER — SULFAMETHOXAZOLE-TRIMETHOPRIM 800-160 MG PO TABS: 1.0000 | ORAL_TABLET | Freq: Two times a day (BID) | ORAL | Status: AC

## 2011-02-18 MED ORDER — METRONIDAZOLE 500 MG PO TABS: 500.0000 mg | ORAL_TABLET | Freq: Two times a day (BID) | ORAL | Status: AC

## 2011-02-18 NOTE — ED Provider Notes (Signed)
History     CSN: [id number] Arrival date & time: 02/18/2011 12:05 AM  Chief Complaint  Patient presents with  . Dysuria   HPI Amber White is a 35 y.o. AA who presents to MAU with burning with urination that started 2 days ago. Last sexual intercourse 3 days ago. Current sex partner x 7 years. She is not pregnant.     Past Medical History  Diagnosis Date  . No pertinent past medical history     Past Surgical History  Procedure Date  . Cholecystectomy     No family history on file.  History  Substance Use Topics  . Smoking status: Never Smoker   . Smokeless tobacco: Not on file  . Alcohol Use: Yes     occassional     OB History    Grav Para Term Preterm Abortions TAB SAB Ect Mult Living   5 4   1 1    4       Review of Systems  Constitutional: Negative for fever and chills.  Respiratory: Negative.   Genitourinary: Positive for dysuria, frequency and vaginal discharge. Negative for vaginal bleeding and pelvic pain.  Musculoskeletal: Negative for back pain.    Physical Exam  BP 133/76  Pulse 95  Resp 18  Ht 5\' 4"  (1.626 m)  Wt 292 lb 6.4 oz (132.632 kg)  BMI 50.19 kg/m2  LMP 01/28/2011  Physical Exam  Nursing note and vitals reviewed. Constitutional: She is oriented to person, place, and time. She appears well-developed and well-nourished.  HENT:  Head: Normocephalic.  Eyes: EOM are normal.  Neck: Neck supple.  Pulmonary/Chest: Effort normal.  Abdominal: Soft. There is no tenderness.  Genitourinary: Cervix exhibits no motion tenderness. Vaginal discharge found.       Frothy yellow malodorous  discharge. No adnexal tenderness.  Musculoskeletal: Normal range of motion.  Neurological: She is alert and oriented to person, place, and time. No cranial nerve deficit.  Skin: Skin is warm and dry.    ED Course  Procedures  UA = Mod Le, 7-11 WBC's, and trichomonas Wet prep =  Assessment: Trichomonas                        UTI  Plan: Flagyl 500 mg.  Po bid x 7 days          Bactrim DS bid x 3 days          Follow up with [facility]          Return here as needed MDM       Kerrie Buffalo, NP 02/18/11 [phone]

## 2011-02-18 NOTE — Progress Notes (Signed)
Pt having pain and burning with urination x 2 days.  LMP 01/28/2011.

## 2011-02-20 NOTE — ED Provider Notes (Signed)
Chart reviewed and agree with management and plan.  

## 2011-04-03 LAB — URINALYSIS, ROUTINE W REFLEX MICROSCOPIC
Bilirubin Urine: NEGATIVE
Glucose, UA: NEGATIVE
Ketones, ur: NEGATIVE
pH: 6

## 2011-04-03 LAB — URINE MICROSCOPIC-ADD ON

## 2011-04-04 LAB — CBC
HCT: 35.4 — ABNORMAL LOW
Hemoglobin: 10.7 — ABNORMAL LOW
Hemoglobin: 11.9 — ABNORMAL LOW
MCHC: 33.6
MCV: 90.9
Platelets: 294
RBC: 3.82 — ABNORMAL LOW
RBC: 3.5 — ABNORMAL LOW
WBC: 8
WBC: 8.8

## 2011-04-04 LAB — RPR: RPR Ser Ql: NONREACTIVE

## 2011-04-21 LAB — URINALYSIS, ROUTINE W REFLEX MICROSCOPIC
Glucose, UA: NEGATIVE
Hgb urine dipstick: NEGATIVE
Protein, ur: NEGATIVE
pH: 5.5

## 2011-04-22 LAB — FETAL FIBRONECTIN: Fetal Fibronectin: NEGATIVE

## 2011-08-17 ENCOUNTER — Encounter (HOSPITAL_COMMUNITY): Payer: Self-pay | Admitting: *Deleted

## 2011-08-17 ENCOUNTER — Emergency Department (HOSPITAL_COMMUNITY): Payer: Medicaid Other

## 2011-08-17 ENCOUNTER — Emergency Department (HOSPITAL_COMMUNITY)
Admission: EM | Admit: 2011-08-17 | Discharge: 2011-08-17 | Disposition: A | Discharge status: Home / Self Care | Payer: Medicaid Other | Attending: Emergency Medicine | Admitting: Emergency Medicine

## 2011-08-17 DIAGNOSIS — M25561 Pain in right knee: Secondary | ICD-10-CM

## 2011-08-17 DIAGNOSIS — M25569 Pain in unspecified knee: Secondary | ICD-10-CM | POA: Insufficient documentation

## 2011-08-17 DIAGNOSIS — W1809XA Striking against other object with subsequent fall, initial encounter: Secondary | ICD-10-CM | POA: Insufficient documentation

## 2011-08-17 MED ORDER — IBUPROFEN 800 MG PO TABS
800.0000 mg | ORAL_TABLET | Freq: Once | ORAL | Status: AC
  Administered 2011-08-17: 800 mg via ORAL
  Filled 2011-08-17: qty 1

## 2011-08-17 MED ORDER — IBUPROFEN 800 MG PO TABS: 800.0000 mg | ORAL_TABLET | Freq: Three times a day (TID) | ORAL | Status: AC

## 2011-08-17 NOTE — ED Provider Notes (Signed)
History     CSN: [id number]  Arrival date & time 08/17/11  0901   First MD Initiated Contact with Patient 08/17/11 [phone]      Chief Complaint  Patient presents with  . Fall  . Knee Pain    Right    HPI The patient p/w R knee pain.  She actually notes two distinct events that have contributed to her pain.  She had a mechanical fall ~2wk ago onto the knee.  And, last night she slipped and struck the same knee against the running board of her truck.  She has maintained ambulatory capacity, but notes pain w motion and weight bearing.  The pain (sharp/ halting sensation) is worse w flexed knee and minimally better w Vicodin and rest.  No distal n/w/t or any other focal complaints.  Past Medical History  Diagnosis Date  . No pertinent past medical history     Past Surgical History  Procedure Date  . Cholecystectomy     History reviewed. No pertinent family history.  History  Substance Use Topics  . Smoking status: Never Smoker   . Smokeless tobacco: Never Used  . Alcohol Use: Yes     occassional     OB History    Grav Para Term Preterm Abortions TAB SAB Ect Mult Living   5 4   1 1    4       Review of Systems  All other systems reviewed and are negative.    Allergies  Penicillins  Home Medications   Current Outpatient Rx  Name Route Sig Dispense Refill  . DOXYCYCLINE HYCLATE 100 MG PO CAPS Oral Take 100 mg by mouth 2 (two) times daily.      . IBUPROFEN 800 MG PO TABS Oral Take 800 mg by mouth every 8 (eight) hours as needed. For pain     . MEDROXYPROGESTERONE ACETATE 150 MG/ML IM SUSP Intramuscular Inject 150 mg into the muscle every 3 (three) months.      . METHOCARBAMOL 750 MG PO TABS Oral Take 750 mg by mouth every 6 (six) hours as needed. Muscle spasm       BP 128/73  Pulse 87  Temp(Src) 98.5 F (36.9 C) (Oral)  Resp 18  Ht 5\' 4"  (1.626 m)  Wt 278 lb (126.1 kg)  BMI 47.72 kg/m2  SpO2 100%  LMP 08/13/2011  Physical Exam  Nursing note and vitals  reviewed. Constitutional: She is oriented to person, place, and time. She appears well-developed and well-nourished. No distress.  HENT:  Head: Normocephalic and atraumatic.  Eyes: Conjunctivae and EOM are normal.  Cardiovascular: Normal rate and regular rhythm.   Pulmonary/Chest: Effort normal and breath sounds normal. No stridor. No respiratory distress. She has no wheezes.  Musculoskeletal:       Legs: Neurological: She is alert and oriented to person, place, and time. No cranial nerve deficit. She exhibits normal muscle tone. Coordination normal.  Skin: Skin is warm and dry. She is not diaphoretic.  Psychiatric: She has a normal mood and affect.    ED Course  Procedures (including critical care time)  Labs Reviewed - No data to display No results found.   No diagnosis found.  X-rays reviewed by me, no acute findings, mild suprapatellar effusion  MDM  This generally well, if obese female now presents with right knee pain that began following 2 separate traumatic events.  On exam the patient has a stable knee with mild midline tenderness.  The patient's pain is  likely due to contusions, though there is some concern for sprain and/or meniscal injury given her description of a halting sensation with flexion.  These results were discussed with the patient.  She is discharged in stable condition to follow up with orthopedics        Gerhard Munch, MD 08/17/11 [phone]

## 2011-08-17 NOTE — ED Notes (Signed)
MD at bedside. 

## 2011-08-17 NOTE — ED Notes (Signed)
Pt from home c/o right knee pain after slipping getting into her truck last night injuring knee over the "running board".

## 2011-11-04 ENCOUNTER — Inpatient Hospital Stay (HOSPITAL_COMMUNITY)
Admission: AD | Admit: 2011-11-04 | Discharge: 2011-11-04 | Disposition: A | Discharge status: Home / Self Care | Payer: Medicaid Other | Source: Ambulatory Visit | Attending: Obstetrics and Gynecology | Admitting: Obstetrics and Gynecology

## 2011-11-04 ENCOUNTER — Encounter (HOSPITAL_COMMUNITY): Payer: Self-pay | Admitting: *Deleted

## 2011-11-04 DIAGNOSIS — A5901 Trichomonal vulvovaginitis: Secondary | ICD-10-CM | POA: Insufficient documentation

## 2011-11-04 DIAGNOSIS — L293 Anogenital pruritus, unspecified: Secondary | ICD-10-CM | POA: Insufficient documentation

## 2011-11-04 LAB — URINALYSIS, ROUTINE W REFLEX MICROSCOPIC
Nitrite: NEGATIVE
Specific Gravity, Urine: 1.02 (ref 1.005–1.030)
Urobilinogen, UA: 1 mg/dL (ref 0.0–1.0)

## 2011-11-04 LAB — URINE MICROSCOPIC-ADD ON

## 2011-11-04 MED ORDER — METRONIDAZOLE 500 MG PO TABS: 500.0000 mg | ORAL_TABLET | Freq: Two times a day (BID) | ORAL | Status: AC

## 2011-11-04 NOTE — MAU Note (Signed)
PT  GETS  DEPO SHOT- RX FROM [facility]-  LAST SHOT  WAS IN [place].  NOTHING ELSE FOR BIRTH CONTROL

## 2011-11-04 NOTE — MAU Provider Note (Signed)
History     CSN: [id number]  Arrival date & time 11/04/11  0106   None     No chief complaint on file.  HPI Amber White is a 36 y.o. female who presents to MAU for vaginal itching and discharge.The symptoms started one week ago and the patient used over the counter yeast cream that helped some but now the symptoms have returned. Denies abdominal pain. Denies UTI symptoms. Current sex partner x 8 years. Last pap smear last year and was normal. Last Depo Provera was in January. The history was provided by the patient.  Past Medical History  Diagnosis Date  . No pertinent past medical history     Past Surgical History  Procedure Date  . Cholecystectomy     History reviewed. No pertinent family history.  History  Substance Use Topics  . Smoking status: Never Smoker   . Smokeless tobacco: Never Used  . Alcohol Use: Yes     occassional     OB History    Grav Para Term Preterm Abortions TAB SAB Ect Mult Living   5 4   1 1    4       Review of Systems  Constitutional: Negative for fever, chills, diaphoresis and fatigue.  HENT: Negative for ear pain, congestion, sore throat, facial swelling, neck pain, neck stiffness, dental problem and sinus pressure.   Eyes: Negative for photophobia, pain and discharge.  Respiratory: Negative for cough, chest tightness and wheezing.   Cardiovascular: Negative.   Gastrointestinal: Negative for nausea, vomiting, abdominal pain, diarrhea, constipation and abdominal distention.  Genitourinary: Positive for vaginal discharge. Negative for dysuria, urgency, frequency, flank pain, vaginal bleeding and difficulty urinating.  Musculoskeletal: Negative for myalgias, back pain and gait problem.  Skin: Negative for color change and rash.  Neurological: Negative for dizziness, speech difficulty, weakness, light-headedness, numbness and headaches.  Psychiatric/Behavioral: Negative for confusion and agitation. The patient is not nervous/anxious.      Allergies  Penicillins  Home Medications  No current outpatient prescriptions on file.  BP 126/80  Pulse 92  Temp(Src) 98 F (36.7 C) (Oral)  Resp 20  Ht 5\' 4"  (1.626 m)  Wt 298 lb 6 oz (135.342 kg)  BMI 51.22 kg/m2  LMP 10/26/2011  Physical Exam  Nursing note and vitals reviewed. Constitutional: She is oriented to person, place, and time. She appears well-developed and well-nourished. No distress.  HENT:  Head: Normocephalic.  Eyes: EOM are normal.  Neck: Neck supple.  Cardiovascular: Normal rate.   Pulmonary/Chest: Effort normal.  Abdominal: Soft. There is no tenderness.       Obese   Genitourinary:       External genitalia without lesions. Frothy yellow discharge vaginal vault, cervix with erythema, no CMT, no adnexal tenderness. Unable to palpate uterus due to patient habitus.  Musculoskeletal: Normal range of motion.  Neurological: She is alert and oriented to person, place, and time. No cranial nerve deficit.  Skin: Skin is warm and dry.  Psychiatric: She has a normal mood and affect. Her behavior is normal. Judgment and thought content normal.   Assessment: Trichomonas vaginosis  Plan:  Flagyl Rx   Discussed need for partner treatment   [facility]   Return here as needed.  ED Course  Procedures   MDM

## 2011-11-04 NOTE — MAU Note (Signed)
PT SAYS  THOUGHT SHE HAD YEAST INFECTION- HAD ITCHING AND WHITE D/C - BOUGHT  MED AT [facility]- FINISHED  ON Friday.  THEN ON   Sunday- ITCHING AGAIN.    CCOB  DELIVERED BABIES.  HAS NOT BEEN BACK SINCE 6 WEEK CHECKUP- WHICH WAS 2 YEARS AGO.

## 2011-11-04 NOTE — Discharge Instructions (Signed)
Trichomoniasis  Trichomoniasis is an infection, caused by the Trichomonas organism, that affects both women and men. In women, the outer female genitalia and the vagina are affected. In men, the penis is mainly affected, but the prostate and other reproductive organs can also be involved. Trichomoniasis is a sexually transmitted disease (STD) and is most often passed to another person through sexual contact. The majority of people who get trichomoniasis do so from a sexual encounter and are also at risk for other STDs.  CAUSES    Sexual intercourse with an infected partner.   It can be present in swimming pools or hot tubs.  SYMPTOMS    Abnormal gray-green frothy vaginal discharge in women.   Vaginal itching and irritation in women.   Itching and irritation of the area outside the vagina in women.   Penile discharge with or without pain in males.   Inflammation of the urethra (urethritis), causing painful urination.   Bleeding after sexual intercourse.  RELATED COMPLICATIONS   Pelvic inflammatory disease.   Infection of the uterus (endometritis).   Infertility.   Tubal (ectopic) pregnancy.   It can be associated with other STDs, including gonorrhea and chlamydia, hepatitis B, and HIV.  COMPLICATIONS DURING PREGNANCY   Early (premature) delivery.   Premature rupture of the membranes (PROM).   Low birth weight.  DIAGNOSIS    Visualization of Trichomonas under the microscope from the vagina discharge.   Ph of the vagina greater than 4.5, tested with a test tape.   Trich Rapid Test.   Culture of the organism, but this is not usually needed.   It may be found on a Pap test.   Having a "strawberry cervix,"which means the cervix looks very red like a strawberry.  TREATMENT    You may be given medication to fight the infection. Inform your caregiver if you could be or are pregnant. Some medications used to treat the infection should not be taken during pregnancy.   Over-the-counter medications or  creams to decrease itching or irritation may be recommended.   Your sexual partner will need to be treated if infected.  HOME CARE INSTRUCTIONS    Take all medication prescribed by your caregiver.   Take over-the-counter medication for itching or irritation as directed by your caregiver.   Do not have sexual intercourse while you have the infection.   Do not douche or wear tampons.   Discuss your infection with your partner, as your partner may have acquired the infection from you. Or, your partner may have been the person who transmitted the infection to you.   Have your sex partner examined and treated if necessary.   Practice safe, informed, and protected sex.   See your caregiver for other STD testing.  SEEK MEDICAL CARE IF:    You still have symptoms after you finish the medication.   You have an oral temperature above 102 F (38.9 C).   You develop belly (abdominal) pain.   You have pain when you urinate.   You have bleeding after sexual intercourse.   You develop a rash.   The medication makes you sick or makes you throw up (vomit).  Document Released: 12/24/2000 Document Revised: 06/19/2011 Document Reviewed: 01/19/2009  Exit[employer] Patient Information 2012 [employer].

## 2011-11-04 NOTE — MAU Provider Note (Signed)
Agree with above note.  Ziad Maye 11/04/2011 6:49 AM

## 2011-11-05 LAB — GC/CHLAMYDIA PROBE AMP, GENITAL
Chlamydia, DNA Probe: NEGATIVE
GC Probe Amp, Genital: NEGATIVE

## 2012-04-26 ENCOUNTER — Encounter (HOSPITAL_COMMUNITY): Payer: Self-pay | Admitting: *Deleted

## 2012-04-26 ENCOUNTER — Inpatient Hospital Stay (HOSPITAL_COMMUNITY)
Admission: AD | Admit: 2012-04-26 | Discharge: 2012-04-26 | Disposition: A | Discharge status: Home / Self Care | Payer: Medicaid Other | Source: Ambulatory Visit | Attending: Obstetrics and Gynecology | Admitting: Obstetrics and Gynecology

## 2012-04-26 DIAGNOSIS — A5901 Trichomonal vulvovaginitis: Secondary | ICD-10-CM

## 2012-04-26 DIAGNOSIS — L293 Anogenital pruritus, unspecified: Secondary | ICD-10-CM | POA: Insufficient documentation

## 2012-04-26 DIAGNOSIS — N949 Unspecified condition associated with female genital organs and menstrual cycle: Secondary | ICD-10-CM | POA: Insufficient documentation

## 2012-04-26 LAB — WET PREP, GENITAL: Clue Cells Wet Prep HPF POC: NONE SEEN

## 2012-04-26 MED ORDER — METRONIDAZOLE 500 MG PO TABS: 500.0000 mg | ORAL_TABLET | Freq: Two times a day (BID) | ORAL | Status: DC

## 2012-04-26 NOTE — MAU Note (Signed)
Started vaginal itching Thursday night, into Friday am. Bought OTC monistat without relief and itching has worsened over the last several hours. Noticed clear discharge yesterday and worsening today.

## 2012-04-26 NOTE — MAU Provider Note (Signed)
History     CSN: [id number]  Arrival date and time: 04/26/12 2141   None     Chief Complaint  Patient presents with  . Vaginal Itching  . Vaginal Discharge   HPI Amber White is a 36 y.o. female who presents to MAU with vaginal discharge and itching. The discharge started 4 days ago. She describes the discharge as watery, blood tinged with bad odor. She used OTC yeast cream 4 days ago and symptoms got worse. Sexually active, unprotected sex with partner of 8 years. Has used Depo Provera for birth control but did not get her shot when due this month. Last sexual intercourse one week ago. Hx of trichomonas less than one year ago. Both patient and partner treated. The history was provided by the patient.  OB History    Grav Para Term Preterm Abortions TAB SAB Ect Mult Living   5 4   1 1    4       Past Medical History  Diagnosis Date  . No pertinent past medical history     Past Surgical History  Procedure Date  . Cholecystectomy     Family History  Problem Relation Age of Onset  . Cancer Mother   . Diabetes Maternal Uncle   . Diabetes Maternal Grandmother   . Heart disease Maternal Grandmother     History  Substance Use Topics  . Smoking status: Never Smoker   . Smokeless tobacco: Never Used  . Alcohol Use: Yes     occassional     Allergies:  Allergies  Allergen Reactions  . Penicillins Hives    Prescriptions prior to admission  Medication Sig Dispense Refill  . doxycycline (VIBRAMYCIN) 100 MG capsule Take 100 mg by mouth 2 (two) times daily.        Marland Kitchen ibuprofen (ADVIL,MOTRIN) 800 MG tablet Take 800 mg by mouth as needed. Used for joint pain.      . naproxen sodium (ANAPROX) 220 MG tablet Take 220 mg by mouth 2 (two) times daily with a meal. headache      . DISCONTD: medroxyPROGESTERone (DEPO-PROVERA) 150 MG/ML injection Inject 150 mg into the muscle every 3 (three) months. Last injection was given in January 2013.      Marland Kitchen DISCONTD: methocarbamol  (ROBAXIN) 750 MG tablet Take 750 mg by mouth every 6 (six) hours as needed. Muscle spasm         Review of Systems  Constitutional: Negative for fever, chills and weight loss.  HENT: Negative for ear pain, nosebleeds, congestion, sore throat and neck pain.   Eyes: Negative for blurred vision, double vision, photophobia and pain.  Respiratory: Negative for cough, shortness of breath and wheezing.   Cardiovascular: Negative for chest pain, palpitations and leg swelling.  Gastrointestinal: Negative for heartburn, nausea, vomiting, abdominal pain, diarrhea and constipation.  Genitourinary: Negative for dysuria, urgency and frequency.       Vaginal discharge  Musculoskeletal: Negative for myalgias and back pain.  Skin: Negative for itching and rash.  Neurological: Negative for dizziness, sensory change, speech change, seizures, weakness and headaches.  Endo/Heme/Allergies: Does not bruise/bleed easily.  Psychiatric/Behavioral: Negative for depression. The patient is not nervous/anxious.    Physical Exam   Blood pressure 137/76, pulse 114, temperature 98.7 F (37.1 C), temperature source Oral, resp. rate 20, last menstrual period 04/10/2012.  Physical Exam  Nursing note and vitals reviewed. Constitutional: She is oriented to person, place, and time. She appears well-developed and well-nourished. No distress.  HENT:  Head: Normocephalic and atraumatic.  Eyes: EOM are normal.  Neck: Neck supple.  Cardiovascular: Normal rate.        Tachycardia   Respiratory: Effort normal and breath sounds normal.  GI: Soft. There is no tenderness.  Genitourinary:       External genitalia small infected follicle, left mons. Non tender. Frothy blood tinged discharge vaginal vault. No CMT, no adnexal tenderness. Unable to palpate uterus due to patient habitus.  Musculoskeletal: Normal range of motion.  Neurological: She is alert and oriented to person, place, and time.  Skin: Skin is warm and dry.    Psychiatric: She has a normal mood and affect. Her behavior is normal. Judgment and thought content normal.   Results for orders placed during the hospital encounter of 04/26/12 (from the past 24 hour(s))  WET PREP, GENITAL     Status: Abnormal   Collection Time   04/26/12 10:35 PM      Component Value Range   Yeast Wet Prep HPF POC NONE SEEN  NONE SEEN   Trich, Wet Prep FEW (*) NONE SEEN   Clue Cells Wet Prep HPF POC NONE SEEN  NONE SEEN   WBC, Wet Prep HPF POC TOO NUMEROUS TO COUNT (*) NONE SEEN   Assessment: 36 y.o. female with vaginal discharge   Trichomonas vaginosis  Plan:  Flagyl   Discussed need for partner treatment   Follow up with [facility] Discussed with the patient and all questioned fully answered. She will return if any problems arise.   Medication List     As of 04/26/2012 11:11 PM    START taking these medications         metroNIDAZOLE 500 MG tablet   Commonly known as: FLAGYL   Take 1 tablet (500 mg total) by mouth 2 (two) times daily.      CONTINUE taking these medications         doxycycline 100 MG capsule   Commonly known as: VIBRAMYCIN      ibuprofen 800 MG tablet   Commonly known as: ADVIL,MOTRIN      naproxen sodium 220 MG tablet   Commonly known as: ANAPROX      STOP taking these medications         medroxyPROGESTERone 150 MG/ML injection   Commonly known as: DEPO-PROVERA      methocarbamol 750 MG tablet   Commonly known as: ROBAXIN          Where to get your medications    These are the prescriptions that you need to pick up.   You may get these medications from any pharmacy.         metroNIDAZOLE 500 MG tablet           MAU Course  Procedures  NEESE,HOPE, RN, FNP, [facility] 04/26/2012, 10:23 PM

## 2012-04-27 LAB — GC/CHLAMYDIA PROBE AMP, GENITAL: GC Probe Amp, Genital: NEGATIVE

## 2012-04-27 NOTE — MAU Provider Note (Signed)
Attestation of Attending Supervision of Advanced Practitioner (CNM/NP): Evaluation and management procedures were performed by the Advanced Practitioner under my supervision and collaboration.  I have reviewed the Advanced Practitioner's note and chart, and I agree with the management and plan.  Maitland Muhlbauer 04/27/2012 5:22 AM

## 2013-02-07 ENCOUNTER — Inpatient Hospital Stay (HOSPITAL_COMMUNITY)
Admission: AD | Admit: 2013-02-07 | Discharge: 2013-02-08 | Disposition: A | Discharge status: Home / Self Care | Payer: Self-pay | Source: Ambulatory Visit | Attending: Family Medicine | Admitting: Family Medicine

## 2013-02-07 DIAGNOSIS — N949 Unspecified condition associated with female genital organs and menstrual cycle: Secondary | ICD-10-CM | POA: Insufficient documentation

## 2013-02-07 DIAGNOSIS — A599 Trichomoniasis, unspecified: Secondary | ICD-10-CM

## 2013-02-07 DIAGNOSIS — L293 Anogenital pruritus, unspecified: Secondary | ICD-10-CM | POA: Insufficient documentation

## 2013-02-07 DIAGNOSIS — A5901 Trichomonal vulvovaginitis: Secondary | ICD-10-CM | POA: Insufficient documentation

## 2013-02-08 ENCOUNTER — Encounter (HOSPITAL_COMMUNITY): Payer: Self-pay | Admitting: *Deleted

## 2013-02-08 DIAGNOSIS — A599 Trichomoniasis, unspecified: Secondary | ICD-10-CM

## 2013-02-08 LAB — WET PREP, GENITAL: Clue Cells Wet Prep HPF POC: NONE SEEN

## 2013-02-08 LAB — URINALYSIS, ROUTINE W REFLEX MICROSCOPIC
Bilirubin Urine: NEGATIVE
Ketones, ur: 15 mg/dL — AB
Nitrite: NEGATIVE
pH: 6 (ref 5.0–8.0)

## 2013-02-08 LAB — URINE MICROSCOPIC-ADD ON

## 2013-02-08 LAB — GC/CHLAMYDIA PROBE AMP: GC Probe RNA: NEGATIVE

## 2013-02-08 MED ORDER — METRONIDAZOLE 500 MG PO TABS
2000.0000 mg | ORAL_TABLET | Freq: Once | ORAL | Status: AC
  Administered 2013-02-08: 2000 mg via ORAL
  Filled 2013-02-08: qty 4

## 2013-02-08 NOTE — MAU Provider Note (Signed)
History     CSN: [id number]  Arrival date and time: 02/07/13 2353   First Provider Initiated Contact with Patient 02/08/13 0025      Chief Complaint  Patient presents with  . Vaginal Itching  . Vaginal Discharge   HPI  Amber White is a 37 y.o. [phone] who is here today because she is certain that she has trichomoniasis. She has vaginal discharge with odor and she  "just knows that is what it is".   Past Medical History  Diagnosis Date  . No pertinent past medical history     Past Surgical History  Procedure Laterality Date  . Cholecystectomy      Family History  Problem Relation Age of Onset  . Cancer Mother   . Diabetes Maternal Uncle   . Diabetes Maternal Grandmother   . Heart disease Maternal Grandmother     History  Substance Use Topics  . Smoking status: Never Smoker   . Smokeless tobacco: Never Used  . Alcohol Use: Yes     Comment: occassional     Allergies:  Allergies  Allergen Reactions  . Penicillins Hives    Prescriptions prior to admission  Medication Sig Dispense Refill  . doxycycline (VIBRAMYCIN) 100 MG capsule Take 100 mg by mouth 2 (two) times daily.        Marland Kitchen ibuprofen (ADVIL,MOTRIN) 800 MG tablet Take 800 mg by mouth as needed. Used for joint pain.      . metroNIDAZOLE (FLAGYL) 500 MG tablet Take 1 tablet (500 mg total) by mouth 2 (two) times daily.  14 tablet  0  . naproxen sodium (ANAPROX) 220 MG tablet Take 220 mg by mouth 2 (two) times daily with a meal. headache        ROS Physical Exam   Blood pressure 122/73, pulse 96, temperature 99.8 F (37.7 C), temperature source Oral, resp. rate 18, height 5\' 4"  (1.626 m), weight 144.244 kg (318 lb), last menstrual period 01/19/2013.  Physical Exam  Nursing note and vitals reviewed. Constitutional: She is oriented to person, place, and time. She appears well-developed and well-nourished. No distress.  Cardiovascular: Normal rate.   Respiratory: Effort normal.  GI: Soft. There is no  tenderness.  Genitourinary:   External: no lesion Vagina: small amount of yellow discharge Cervix: pink, smooth, no CMT Uterus: NSSC Adnexa: NT   Neurological: She is alert and oriented to person, place, and time.  Skin: Skin is warm and dry.  Psychiatric: She has a normal mood and affect.    MAU Course  Procedures  Results for orders placed during the hospital encounter of 02/07/13 (from the past 24 hour(s))  URINALYSIS, ROUTINE W REFLEX MICROSCOPIC     Status: Abnormal   Collection Time    02/07/13 11:58 PM      Result Value Range   Color, Urine YELLOW  YELLOW   APPearance CLEAR  CLEAR   Specific Gravity, Urine >1.030 (*) 1.005 - 1.030   pH 6.0  5.0 - 8.0   Glucose, UA NEGATIVE  NEGATIVE mg/dL   Hgb urine dipstick NEGATIVE  NEGATIVE   Bilirubin Urine NEGATIVE  NEGATIVE   Ketones, ur 15 (*) NEGATIVE mg/dL   Protein, ur NEGATIVE  NEGATIVE mg/dL   Urobilinogen, UA 0.2  0.0 - 1.0 mg/dL   Nitrite NEGATIVE  NEGATIVE   Leukocytes, UA MODERATE (*) NEGATIVE  URINE MICROSCOPIC-ADD ON     Status: Abnormal   Collection Time    02/07/13 11:58 PM  Result Value Range   Squamous Epithelial / LPF FEW (*) RARE   WBC, UA 11-20  <3 WBC/hpf   Bacteria, UA FEW (*) RARE   Urine-Other MUCOUS PRESENT    POCT PREGNANCY, URINE     Status: None   Collection Time    02/08/13 12:06 AM      Result Value Range   Preg Test, Ur NEGATIVE  NEGATIVE  WET PREP, GENITAL     Status: Abnormal   Collection Time    02/08/13 12:37 AM      Result Value Range   Yeast Wet Prep HPF POC NONE SEEN  NONE SEEN   Trich, Wet Prep FEW (*) NONE SEEN   Clue Cells Wet Prep HPF POC NONE SEEN  NONE SEEN   WBC, Wet Prep HPF POC MODERATE (*) NONE SEEN     Assessment and Plan   1. Trichomonas    Treated here in MAU Discussed partner treatment FU as needed   Tawnya Crook 02/08/2013, 12:29 AM

## 2013-02-08 NOTE — MAU Provider Note (Signed)
Chart reviewed and agree with management and plan.  

## 2013-02-08 NOTE — MAU Note (Signed)
Patient states she knows she has Trichomoniasis. She has had it before and she is having the same symptoms.

## 2013-02-09 LAB — URINE CULTURE

## 2013-08-17 ENCOUNTER — Inpatient Hospital Stay (HOSPITAL_COMMUNITY)
Admission: AD | Admit: 2013-08-17 | Discharge: 2013-08-17 | Disposition: A | Discharge status: Home / Self Care | Payer: Medicaid Other | Source: Ambulatory Visit | Attending: Obstetrics and Gynecology | Admitting: Obstetrics and Gynecology

## 2013-08-17 ENCOUNTER — Encounter (HOSPITAL_COMMUNITY): Payer: Self-pay | Admitting: *Deleted

## 2013-08-17 DIAGNOSIS — L293 Anogenital pruritus, unspecified: Secondary | ICD-10-CM | POA: Insufficient documentation

## 2013-08-17 DIAGNOSIS — N898 Other specified noninflammatory disorders of vagina: Secondary | ICD-10-CM | POA: Insufficient documentation

## 2013-08-17 DIAGNOSIS — N9089 Other specified noninflammatory disorders of vulva and perineum: Secondary | ICD-10-CM

## 2013-08-17 DIAGNOSIS — N909 Noninflammatory disorder of vulva and perineum, unspecified: Secondary | ICD-10-CM | POA: Insufficient documentation

## 2013-08-17 LAB — URINALYSIS, ROUTINE W REFLEX MICROSCOPIC
Bilirubin Urine: NEGATIVE
GLUCOSE, UA: NEGATIVE mg/dL
Hgb urine dipstick: NEGATIVE
KETONES UR: NEGATIVE mg/dL
NITRITE: NEGATIVE
PROTEIN: NEGATIVE mg/dL
Specific Gravity, Urine: 1.01 (ref 1.005–1.030)
UROBILINOGEN UA: 1 mg/dL (ref 0.0–1.0)
pH: 7 (ref 5.0–8.0)

## 2013-08-17 LAB — WET PREP, GENITAL
Clue Cells Wet Prep HPF POC: NONE SEEN
Trich, Wet Prep: NONE SEEN
Yeast Wet Prep HPF POC: NONE SEEN

## 2013-08-17 LAB — URINE MICROSCOPIC-ADD ON

## 2013-08-17 LAB — POCT PREGNANCY, URINE: PREG TEST UR: NEGATIVE

## 2013-08-17 MED ORDER — NYSTATIN-TRIAMCINOLONE 100000-0.1 UNIT/GM-% EX OINT: 1.0000 "application " | TOPICAL_OINTMENT | Freq: Two times a day (BID) | CUTANEOUS | Status: DC

## 2013-08-17 NOTE — MAU Provider Note (Signed)
Attestation of Attending Supervision of Advanced Practitioner (CNM/NP): Evaluation and management procedures were performed by the Advanced Practitioner under my supervision and collaboration.  I have reviewed the Advanced Practitioner's note and chart, and I agree with the management and plan.  Sumayya Muha 08/17/2013 2:42 PM

## 2013-08-17 NOTE — Discharge Instructions (Signed)
Disorders of the Vulva It is important to know the outside (external) area of the female genitalia to properly examine yourself. The vulva or labia, sometimes also called the lips around the vagina, covers the pubic bone and surrounds the vagina. The larger or outer labia is called the labia majora. The inner or smaller labia is called the labia minora. The clitoris is at the top of the vulva. It is covered by a small area of tissue called the hood. Below the clitoris is the opening of the tube from the bladder, which is the urethral opening. Just below the urethral opening is the opening of the vagina. This area is called the vestibule. Inside the vestibule are bartholin and skene glands that lubricate the outside of the vagina during sexual intercourse. The perineum is the area that lies between the vagina and anus. You should examine your external genitalia once a month just as you do your self breast examination. SIGNS AND SYMPTOMS TO LOOK FOR DURING YOUR EXAMINATION:  Swelling.  Redness.  Bumps.  Blisters.  Black or white areas.  Itching.  Bleeding.  Burning. TYPES OF VULVAR PROBLEMS  Infections.  Yeast (fungus) is the most common infection that causes redness, swelling, itching and a thick white vaginal discharge. Women with diabetes, taking medicines that kill germs (antibiotics) or on birth control pills are at risk for yeast infection. This infection is treated with vaginal creams, suppositories and anti-itch cream for the vulva.  Genital warts (condyloma) are caused by the human papilloma virus. They are raised bumps that can itch, bleed, cause discomfort and sometimes appear in groups like cauliflower. This is a sexually transmitted disease (STD) caused by sexual contact. This can be treated with topical medication, freezing, electrocautery, laser or surgical removal.  Genital herpes is a virus that causes redness, swelling, blisters and ulcers that can cause itching and are  very painful. This is also a STD. There are medications to control the symptoms of herpes, but there is no cure. Once you have it, you keep getting it because the virus stays in your body.  Contact dermatitis. Contact dermatitis is an irritation of the vulva that can cause redness, swelling and itching. It can be caused by:  Perfumed toilet paper.  Deodorants.  Talcum powder.  Hygiene sprays.  Tampons.  Soaps and fabric softeners.  Wearing wet underwear and bathing suits too long.  Spermicide.  Condoms.  Diaphragms.  Poison ivy. Treatment will depend on eliminating the cause and treating the symptoms.  Vulvodynia.  Vulvodynia is "painful vulva." It includes burning, itching, irritation and a feeling of rawness or bruising of the vulva. Treating this problem depends on the cause and diagnosis. Treatment can take a long time.  Vulvar Dystrophy.  Vulvar Dystrophy is a disorder of the skin of the vulva. The skin can be too thick with hard raised patches or too thin skin showing wrinkles. Vulvar dystrophy can cause redness or white patches, itching and burning sensation. A biopsy may be needed to diagnose the problem. Treatment with creams or ointments depends on the diagnosis.  Vulvar Cancer.  Vulvar cancer is usually a form of squamus skin cancer. Other types of vulvar cancer like melenoma (a dark or black, irregular shaped mole that can bleed easily) and adenocarcinoma (red, itchy, scaly area that looks like eczema) are rare. Treatment is usually surgery to remove the cancerous area, the vulva and the lymph nodes in the groin. This can be done with or without radiation and chemotherapy. HOME   CARE INSTRUCTIONS   Look at your vulva and external genital area every month.  Follow and finish all treatment given to you by your caregiver.  Do not use perfumed or scented soaps, detergents, hygiene spray, talcum powder, tampons, douches or toilet paper.  Remove your tampon every 6  hours. Do not leave tampons in overnight.  Wear loose-fitting clothing.  Wear underwear that is cotton.  Avoid spermicidal creams or foams that irritate you. SEEK MEDICAL CARE IF:   You notice changes on your vulva such as redness, swelling, itching, color changes, bleeding, small bumps, blisters or any discomfort.  You develop an abnormal vaginal discharge.  You have painful sexual intercourse.  You notice swelling of the lymph nodes in your groin. Document Released: 12/17/2007 Document Revised: 09/22/2011 Document Reviewed: 09/30/2010 [employer] Patient Information 2014 [employer].  

## 2013-08-17 NOTE — MAU Provider Note (Signed)
History     CSN: [id number]  Arrival date and time: 08/17/13 7846   First Provider Initiated Contact with Patient 08/17/13 1020      Chief Complaint  Patient presents with  . Vaginal Itching   Vaginal Itching    Amber White is a 38 y.o. [phone] who presents today with vaginal odor and irritation. She states that her symptoms have been present for about 4 days. She denies any abnormal uterine bleeding. She denies any pain at this time.   Past Medical History  Diagnosis Date  . No pertinent past medical history     Past Surgical History  Procedure Laterality Date  . Cholecystectomy      Family History  Problem Relation Age of Onset  . Cancer Mother   . Diabetes Maternal Uncle   . Diabetes Maternal Grandmother   . Heart disease Maternal Grandmother     History  Substance Use Topics  . Smoking status: Never Smoker   . Smokeless tobacco: Never Used  . Alcohol Use: Yes     Comment: occassional     Allergies:  Allergies  Allergen Reactions  . Penicillins Hives    No prescriptions prior to admission    ROS Physical Exam   Blood pressure 131/82, pulse 83, temperature 98.7 F (37.1 C), resp. rate 18, last menstrual period 08/03/2013.  Physical Exam  Nursing note and vitals reviewed. Constitutional: She is oriented to person, place, and time. She appears well-developed and well-nourished. No distress.  Cardiovascular: Normal rate.   Respiratory: Effort normal.  GI: Soft. There is no tenderness.  Genitourinary:   External: no lesion Vagina: small amount of white discharge Cervix: pink, smooth, no CMT Uterus: NSSC Adnexa: NT   Neurological: She is alert and oriented to person, place, and time.  Skin: Skin is warm and dry.  Psychiatric: She has a normal mood and affect.    MAU Course  Procedures  Results for orders placed during the hospital encounter of 08/17/13 (from the past 24 hour(s))  URINALYSIS, ROUTINE W REFLEX MICROSCOPIC     Status:  Abnormal   Collection Time    08/17/13  9:25 AM      Result Value Range   Color, Urine YELLOW  YELLOW   APPearance HAZY (*) CLEAR   Specific Gravity, Urine 1.010  1.005 - 1.030   pH 7.0  5.0 - 8.0   Glucose, UA NEGATIVE  NEGATIVE mg/dL   Hgb urine dipstick NEGATIVE  NEGATIVE   Bilirubin Urine NEGATIVE  NEGATIVE   Ketones, ur NEGATIVE  NEGATIVE mg/dL   Protein, ur NEGATIVE  NEGATIVE mg/dL   Urobilinogen, UA 1.0  0.0 - 1.0 mg/dL   Nitrite NEGATIVE  NEGATIVE   Leukocytes, UA TRACE (*) NEGATIVE  URINE MICROSCOPIC-ADD ON     Status: Abnormal   Collection Time    08/17/13  9:25 AM      Result Value Range   Squamous Epithelial / LPF MANY (*) RARE   WBC, UA 0-2  <3 WBC/hpf   Bacteria, UA RARE  RARE  POCT PREGNANCY, URINE     Status: None   Collection Time    08/17/13  9:32 AM      Result Value Range   Preg Test, Ur NEGATIVE  NEGATIVE  WET PREP, GENITAL     Status: Abnormal   Collection Time    08/17/13 10:25 AM      Result Value Range   Yeast Wet Prep HPF POC NONE SEEN  NONE SEEN   Trich, Wet Prep NONE SEEN  NONE SEEN   Clue Cells Wet Prep HPF POC NONE SEEN  NONE SEEN   WBC, Wet Prep HPF POC MANY (*) NONE SEEN     Assessment and Plan   1. Vulvar irritation      Medication List         nystatin-triamcinolone ointment  Commonly known as:  MYCOLOG  Apply 1 application topically 2 (two) times daily.       Follow-up Information   Follow up with [facility]. (As needed)    Contact information:   [id number] [place] [address] [phone]      Mathis Bud 08/17/2013, 10:29 AM

## 2013-08-17 NOTE — MAU Note (Signed)
Pt presents with complaints of vaginal itching and a clear vaginal discharge that started on Sunday. Denies any vaginal bleeding

## 2013-08-18 LAB — GC/CHLAMYDIA PROBE AMP
CT Probe RNA: NEGATIVE
GC PROBE AMP APTIMA: NEGATIVE

## 2014-02-19 ENCOUNTER — Inpatient Hospital Stay (HOSPITAL_COMMUNITY): Payer: Medicaid Other

## 2014-02-19 ENCOUNTER — Inpatient Hospital Stay (HOSPITAL_COMMUNITY)
Admission: AD | Admit: 2014-02-19 | Discharge: 2014-02-19 | Disposition: A | Discharge status: Home / Self Care | Payer: Medicaid Other | Source: Ambulatory Visit | Attending: Obstetrics & Gynecology | Admitting: Obstetrics & Gynecology

## 2014-02-19 ENCOUNTER — Encounter (HOSPITAL_COMMUNITY): Payer: Self-pay | Admitting: *Deleted

## 2014-02-19 DIAGNOSIS — R51 Headache: Secondary | ICD-10-CM | POA: Insufficient documentation

## 2014-02-19 DIAGNOSIS — O99019 Anemia complicating pregnancy, unspecified trimester: Secondary | ICD-10-CM

## 2014-02-19 DIAGNOSIS — O219 Vomiting of pregnancy, unspecified: Secondary | ICD-10-CM

## 2014-02-19 DIAGNOSIS — D271 Benign neoplasm of left ovary: Secondary | ICD-10-CM

## 2014-02-19 DIAGNOSIS — Z3490 Encounter for supervision of normal pregnancy, unspecified, unspecified trimester: Secondary | ICD-10-CM

## 2014-02-19 DIAGNOSIS — O99891 Other specified diseases and conditions complicating pregnancy: Secondary | ICD-10-CM | POA: Insufficient documentation

## 2014-02-19 DIAGNOSIS — D649 Anemia, unspecified: Secondary | ICD-10-CM | POA: Insufficient documentation

## 2014-02-19 DIAGNOSIS — O9989 Other specified diseases and conditions complicating pregnancy, childbirth and the puerperium: Principal | ICD-10-CM

## 2014-02-19 LAB — URINALYSIS, ROUTINE W REFLEX MICROSCOPIC
Bilirubin Urine: NEGATIVE
Glucose, UA: NEGATIVE mg/dL
HGB URINE DIPSTICK: NEGATIVE
Ketones, ur: NEGATIVE mg/dL
Nitrite: NEGATIVE
Protein, ur: NEGATIVE mg/dL
SPECIFIC GRAVITY, URINE: 1.02 (ref 1.005–1.030)
UROBILINOGEN UA: 4 mg/dL — AB (ref 0.0–1.0)
pH: 6 (ref 5.0–8.0)

## 2014-02-19 LAB — COMPREHENSIVE METABOLIC PANEL
ALT: 14 U/L (ref 0–35)
AST: 15 U/L (ref 0–37)
Albumin: 3.3 g/dL — ABNORMAL LOW (ref 3.5–5.2)
Alkaline Phosphatase: 56 U/L (ref 39–117)
Anion gap: 12 (ref 5–15)
BUN: 7 mg/dL (ref 6–23)
CALCIUM: 9.1 mg/dL (ref 8.4–10.5)
CHLORIDE: 105 meq/L (ref 96–112)
CO2: 23 meq/L (ref 19–32)
Creatinine, Ser: 0.71 mg/dL (ref 0.50–1.10)
GFR calc Af Amer: >90 mL/min (ref >90)
GLUCOSE: 99 mg/dL (ref 70–99)
Potassium: 4.2 mEq/L (ref 3.7–5.3)
SODIUM: 140 meq/L (ref 137–147)
Total Bilirubin: 0.4 mg/dL (ref 0.3–1.2)
Total Protein: 7.6 g/dL (ref 6.0–8.3)

## 2014-02-19 LAB — WET PREP, GENITAL
Clue Cells Wet Prep HPF POC: NONE SEEN
Trich, Wet Prep: NONE SEEN
YEAST WET PREP: NONE SEEN

## 2014-02-19 LAB — CBC
HCT: 33.9 % — ABNORMAL LOW (ref 36.0–46.0)
HEMOGLOBIN: 11.3 g/dL — AB (ref 12.0–15.0)
MCH: 29.3 pg (ref 26.0–34.0)
MCHC: 33.3 g/dL (ref 30.0–36.0)
MCV: 87.8 fL (ref 78.0–100.0)
PLATELETS: 335 10*3/uL (ref 150–400)
RBC: 3.86 MIL/uL — AB (ref 3.87–5.11)
RDW: 14.3 % (ref 11.5–15.5)
WBC: 8.2 10*3/uL (ref 4.0–10.5)

## 2014-02-19 LAB — HIV ANTIBODY (ROUTINE TESTING W REFLEX): HIV 1&2 Ab, 4th Generation: NONREACTIVE

## 2014-02-19 LAB — ABO/RH: ABO/RH(D): O POS

## 2014-02-19 LAB — URINE MICROSCOPIC-ADD ON

## 2014-02-19 LAB — HCG, QUANTITATIVE, PREGNANCY: hCG, Beta Chain, Quant, S: 7443 m[IU]/mL — ABNORMAL HIGH (ref <5)

## 2014-02-19 LAB — POCT PREGNANCY, URINE: Preg Test, Ur: POSITIVE — AB

## 2014-02-19 MED ORDER — METOCLOPRAMIDE HCL 10 MG PO TABS
10.0000 mg | ORAL_TABLET | Freq: Once | ORAL | Status: AC
  Administered 2014-02-19: 10 mg via ORAL
  Filled 2014-02-19: qty 1

## 2014-02-19 MED ORDER — ACETAMINOPHEN 325 MG PO TABS
650.0000 mg | ORAL_TABLET | Freq: Once | ORAL | Status: AC
  Administered 2014-02-19: 650 mg via ORAL
  Filled 2014-02-19: qty 2

## 2014-02-19 MED ORDER — METOCLOPRAMIDE HCL 10 MG PO TABS: 10.0000 mg | ORAL_TABLET | Freq: Four times a day (QID) | ORAL | Status: DC

## 2014-02-19 NOTE — MAU Provider Note (Signed)
History     CSN: [id number]  Arrival date and time: 02/19/14 0945   First Provider Initiated Contact with Patient 02/19/14 1023      Chief Complaint  Patient presents with  . Headache  . Late Period   HPI Comments: Amber White 38 y.o. [id number] presents to MAU with headache and late menses. She has an unsure LMP  Around June 29th making her 5 weeks and 6 days. She states she "feels something moving in there". This is unplanned pregnancy. She denies any pain. She had her last child with [facility] four years ago. She denies hypertension and a review of her past blood pressures show no elevations.    Headache  Associated symptoms include nausea.      Past Medical History  Diagnosis Date  . No pertinent past medical history     Past Surgical History  Procedure Laterality Date  . Cholecystectomy      Family History  Problem Relation Age of Onset  . Cancer Mother   . Diabetes Maternal Uncle   . Diabetes Maternal Grandmother   . Heart disease Maternal Grandmother     History  Substance Use Topics  . Smoking status: Never Smoker   . Smokeless tobacco: Never Used  . Alcohol Use: Yes     Comment: occassional     Allergies:  Allergies  Allergen Reactions  . Penicillins Hives    Prescriptions prior to admission  Medication Sig Dispense Refill  . acetaminophen (TYLENOL) 500 MG tablet Take 1,000 mg by mouth every 6 (six) hours as needed.        Review of Systems  Constitutional: Negative.   Eyes: Negative.   Respiratory: Negative.   Cardiovascular: Negative.   Gastrointestinal: Positive for nausea.  Genitourinary: Negative.        Irregular vaginal menses  Musculoskeletal: Negative.   Skin: Negative.   Neurological: Negative.   Psychiatric/Behavioral: Negative.    Physical Exam   Blood pressure 146/89, pulse 100, temperature 98 F (36.7 C), temperature source Oral, resp. rate 18, height 5' 4.5" (1.638 m), weight 136.261 kg (300 lb 6.4 oz), last menstrual  period 08/03/2013.  Physical Exam  Constitutional: She is oriented to person, place, and time. She appears well-developed and well-nourished. No distress.  Morbid obesity  HENT:  Head: Normocephalic and atraumatic.  Eyes: Pupils are equal, round, and reactive to light.  Cardiovascular: Normal rate, regular rhythm and normal heart sounds.   Respiratory: Effort normal and breath sounds normal.  GI: Soft. Bowel sounds are normal.  Genitourinary:  Genital:External negative Vaginal:small amount white discharge Cervix:closed/ thick Bimanual:uterus does not feel enlarged but difficult due to size   Musculoskeletal: Normal range of motion.  Neurological: She is alert and oriented to person, place, and time.  Skin: Skin is warm and dry.  Psychiatric: She has a normal mood and affect. Her behavior is normal. Judgment and thought content normal.   Results for orders placed during the hospital encounter of 02/19/14 (from the past 24 hour(s))  URINALYSIS, ROUTINE W REFLEX MICROSCOPIC     Status: Abnormal   Collection Time    02/19/14 10:04 AM      Result Value Ref Range   Color, Urine YELLOW  YELLOW   APPearance CLEAR  CLEAR   Specific Gravity, Urine 1.020  1.005 - 1.030   pH 6.0  5.0 - 8.0   Glucose, UA NEGATIVE  NEGATIVE mg/dL   Hgb urine dipstick NEGATIVE  NEGATIVE   Bilirubin Urine NEGATIVE  NEGATIVE   Ketones, ur NEGATIVE  NEGATIVE mg/dL   Protein, ur NEGATIVE  NEGATIVE mg/dL   Urobilinogen, UA 4.0 (*) 0.0 - 1.0 mg/dL   Nitrite NEGATIVE  NEGATIVE   Leukocytes, UA TRACE (*) NEGATIVE  URINE MICROSCOPIC-ADD ON     Status: Abnormal   Collection Time    02/19/14 10:04 AM      Result Value Ref Range   Squamous Epithelial / LPF FEW (*) RARE   WBC, UA 3-6  <3 WBC/hpf   RBC / HPF 0-2  <3 RBC/hpf   Bacteria, UA MANY (*) RARE  POCT PREGNANCY, URINE     Status: Abnormal   Collection Time    02/19/14 10:11 AM      Result Value Ref Range   Preg Test, Ur POSITIVE (*) NEGATIVE  WET PREP,  GENITAL     Status: Abnormal   Collection Time    02/19/14 10:42 AM      Result Value Ref Range   Yeast Wet Prep HPF POC NONE SEEN  NONE SEEN   Trich, Wet Prep NONE SEEN  NONE SEEN   Clue Cells Wet Prep HPF POC NONE SEEN  NONE SEEN   WBC, Wet Prep HPF POC MODERATE (*) NONE SEEN  CBC     Status: Abnormal   Collection Time    02/19/14 11:00 AM      Result Value Ref Range   WBC 8.2  4.0 - 10.5 K/uL   RBC 3.86 (*) 3.87 - 5.11 MIL/uL   Hemoglobin 11.3 (*) 12.0 - 15.0 g/dL   HCT 33.9 (*) 36.0 - 46.0 %   MCV 87.8  78.0 - 100.0 fL   MCH 29.3  26.0 - 34.0 pg   MCHC 33.3  30.0 - 36.0 g/dL   RDW 14.3  11.5 - 15.5 %   Platelets 335  150 - 400 K/uL  COMPREHENSIVE METABOLIC PANEL     Status: Abnormal   Collection Time    02/19/14 11:00 AM      Result Value Ref Range   Sodium 140  137 - 147 mEq/L   Potassium 4.2  3.7 - 5.3 mEq/L   Chloride 105  96 - 112 mEq/L   CO2 23  19 - 32 mEq/L   Glucose, Bld 99  70 - 99 mg/dL   BUN 7  6 - 23 mg/dL   Creatinine, Ser 0.71  0.50 - 1.10 mg/dL   Calcium 9.1  8.4 - 10.5 mg/dL   Total Protein 7.6  6.0 - 8.3 g/dL   Albumin 3.3 (*) 3.5 - 5.2 g/dL   AST 15  0 - 37 U/L   ALT 14  0 - 35 U/L   Alkaline Phosphatase 56  39 - 117 U/L   Total Bilirubin 0.4  0.3 - 1.2 mg/dL   GFR calc non Af Amer >90  >90 mL/min   GFR calc Af Amer >90  >90 mL/min   Anion gap 12  5 - 15  HCG, QUANTITATIVE, PREGNANCY     Status: Abnormal   Collection Time    02/19/14 11:00 AM      Result Value Ref Range   hCG, Beta Chain, Quant, S 7443 (*) <5 mIU/mL  ABO/RH     Status: None   Collection Time    02/19/14 11:00 AM      Result Value Ref Range   ABO/RH(D) O POS     US Ob Comp Less 14 Wks  02/19/2014   CLINICAL  DATA:  Irregular cycles. Quantitative beta HCG is 7,443. Unknown LMP.  EXAM: OBSTETRIC <14 WK [place] AND TRANSVAGINAL OB US  TECHNIQUE: Both transabdominal and transvaginal ultrasound examinations were performed for complete evaluation of the gestation as well as the maternal  uterus, adnexal regions, and pelvic cul-de-sac. Transvaginal technique was performed to assess early pregnancy.  COMPARISON:  None.  FINDINGS: Intrauterine gestational sac: Visualized/normal in shape.  Yolk sac:  Present  Embryo:  Present  Cardiac Activity: Present  Heart Rate:  117 bpm  CRL:   3.7  mm   6 w 1 d                  [place] EDC: 10/14/2014  Maternal uterus/adnexae: No subchorionic hemorrhage identified. Right corpus luteum cyst is present. Echogenic structure in the left ovary is 0.8 x 1.1 x 0.6 cm, consistent with small after dermoid. No free pelvic fluid.  IMPRESSION: 1. Single living intrauterine embryo at 6 weeks 1 day by today's ultrasound. 2. Ultrasound EDC is 10/14/2014. 3. Small left ovarian dermoid 1.1 cm.   Electronically Signed   By: Shon Hale M.D.   On: 02/19/2014 11:59   US Ob Transvaginal  02/19/2014   CLINICAL DATA:  Irregular cycles. Quantitative beta HCG is 7,443. Unknown LMP.  EXAM: OBSTETRIC <14 WK [place] AND TRANSVAGINAL OB US  TECHNIQUE: Both transabdominal and transvaginal ultrasound examinations were performed for complete evaluation of the gestation as well as the maternal uterus, adnexal regions, and pelvic cul-de-sac. Transvaginal technique was performed to assess early pregnancy.  COMPARISON:  None.  FINDINGS: Intrauterine gestational sac: Visualized/normal in shape.  Yolk sac:  Present  Embryo:  Present  Cardiac Activity: Present  Heart Rate:  117 bpm  CRL:   3.7  mm   6 w 1 d                  [place] EDC: 10/14/2014  Maternal uterus/adnexae: No subchorionic hemorrhage identified. Right corpus luteum cyst is present. Echogenic structure in the left ovary is 0.8 x 1.1 x 0.6 cm, consistent with small after dermoid. No free pelvic fluid.  IMPRESSION: 1. Single living intrauterine embryo at 6 weeks 1 day by today's ultrasound. 2. Ultrasound EDC is 10/14/2014. 3. Small left ovarian dermoid 1.1 cm.   Electronically Signed   By: Shon Hale M.D.   On: 02/19/2014 11:59    MAU Course   Procedures  MDM Wet prep, GC, Chlamydia, CBC, UA, U/S, ABORh, Quant, HIV, CMET Tylenol and Reglan for headache and slight nausea Reviewed labs and ultrasound with patient Culture urine/ no sx UTI Assessment and Plan   A: Pregnancy Headache Nausea Anemia  P: Above testing done Advised to start PNV and prenatal care Pregnancy Verification Reglan 10 mg po q6 hrs Return to MAU as needed      Georgia Duff 02/19/2014, 10:50 AM

## 2014-02-19 NOTE — Discharge Instructions (Signed)
Anemia, Nonspecific Anemia is a condition in which the concentration of red blood cells or hemoglobin in the blood is below normal. Hemoglobin is a substance in red blood cells that carries oxygen to the tissues of the body. Anemia results in not enough oxygen reaching these tissues.  CAUSES  Common causes of anemia include:   Excessive bleeding. Bleeding may be internal or external. This includes excessive bleeding from periods (in women) or from the intestine.   Poor nutrition.   Chronic kidney, thyroid, and liver disease.  Bone marrow disorders that decrease red blood cell production.  Cancer and treatments for cancer.  HIV, AIDS, and their treatments.  Spleen problems that increase red blood cell destruction.  Blood disorders.  Excess destruction of red blood cells due to infection, medicines, and autoimmune disorders. SIGNS AND SYMPTOMS   Minor weakness.   Dizziness.   Headache.  Palpitations.   Shortness of breath, especially with exercise.   Paleness.  Cold sensitivity.  Indigestion.  Nausea.  Difficulty sleeping.  Difficulty concentrating. Symptoms may occur suddenly or they may develop slowly.  DIAGNOSIS  Additional blood tests are often needed. These help your health care provider determine the best treatment. Your health care provider will check your stool for blood and look for other causes of blood loss.  TREATMENT  Treatment varies depending on the cause of the anemia. Treatment can include:   Supplements of iron, vitamin W29, or folic acid.   Hormone medicines.   A blood transfusion. This may be needed if blood loss is severe.   Hospitalization. This may be needed if there is significant continual blood loss.   Dietary changes.  Spleen removal. HOME CARE INSTRUCTIONS Keep all follow-up appointments. It often takes many weeks to correct anemia, and having your health care provider check on your condition and your response to  treatment is very important. SEEK IMMEDIATE MEDICAL CARE IF:   You develop extreme weakness, shortness of breath, or chest pain.   You become dizzy or have trouble concentrating.  You develop heavy vaginal bleeding.   You develop a rash.   You have bloody or black, tarry stools.   You faint.   You vomit up blood.   You vomit repeatedly.   You have abdominal pain.  You have a fever or persistent symptoms for more than 2-3 days.   You have a fever and your symptoms suddenly get worse.   You are dehydrated.  MAKE SURE YOU:  Understand these instructions.  Will watch your condition.  Will get help right away if you are not doing well or get worse. Document Released: 08/07/2004 Document Revised: 03/02/2013 Document Reviewed: 12/24/2012 [facility] Patient Information 2015 [place], [place]. This information is not intended to replace advice given to you by your health care provider. Make sure you discuss any questions you have with your health care provider. Nausea and Vomiting Nausea is a sick feeling that often comes before throwing up (vomiting). Vomiting is a reflex where stomach contents come out of your mouth. Vomiting can cause severe loss of body fluids (dehydration). Children and elderly adults can become dehydrated quickly, especially if they also have diarrhea. Nausea and vomiting are symptoms of a condition or disease. It is important to find the cause of your symptoms. CAUSES   Direct irritation of the stomach lining. This irritation can result from increased acid production (gastroesophageal reflux disease), infection, food poisoning, taking certain medicines (such as nonsteroidal anti-inflammatory drugs), alcohol use, or tobacco use.  Signals from  the brain.These signals could be caused by a headache, heat exposure, an inner ear disturbance, increased pressure in the brain from injury, infection, a tumor, or a concussion, pain, emotional stimulus, or metabolic  problems.  An obstruction in the gastrointestinal tract (bowel obstruction).  Illnesses such as diabetes, hepatitis, gallbladder problems, appendicitis, kidney problems, cancer, sepsis, atypical symptoms of a heart attack, or eating disorders.  Medical treatments such as chemotherapy and radiation.  Receiving medicine that makes you sleep (general anesthetic) during surgery. DIAGNOSIS Your caregiver may ask for tests to be done if the problems do not improve after a few days. Tests may also be done if symptoms are severe or if the reason for the nausea and vomiting is not clear. Tests may include:  Urine tests.  Blood tests.  Stool tests.  Cultures (to look for evidence of infection).  X-rays or other imaging studies. Test results can help your caregiver make decisions about treatment or the need for additional tests. TREATMENT You need to stay well hydrated. Drink frequently but in small amounts.You may wish to drink water, sports drinks, clear broth, or eat frozen ice pops or gelatin dessert to help stay hydrated.When you eat, eating slowly may help prevent nausea.There are also some antinausea medicines that may help prevent nausea. HOME CARE INSTRUCTIONS   Take all medicine as directed by your caregiver.  If you do not have an appetite, do not force yourself to eat. However, you must continue to drink fluids.  If you have an appetite, eat a normal diet unless your caregiver tells you differently.  Eat a variety of complex carbohydrates (rice, wheat, potatoes, bread), lean meats, yogurt, fruits, and vegetables.  Avoid high-fat foods because they are more difficult to digest.  Drink enough water and fluids to keep your urine clear or pale yellow.  If you are dehydrated, ask your caregiver for specific rehydration instructions. Signs of dehydration may include:  Severe thirst.  Dry lips and mouth.  Dizziness.  Dark urine.  Decreasing urine frequency and  amount.  Confusion.  Rapid breathing or pulse. SEEK IMMEDIATE MEDICAL CARE IF:   You have blood or brown flecks (like coffee grounds) in your vomit.  You have black or bloody stools.  You have a severe headache or stiff neck.  You are confused.  You have severe abdominal pain.  You have chest pain or trouble breathing.  You do not urinate at least once every 8 hours.  You develop cold or clammy skin.  You continue to vomit for longer than 24 to 48 hours.  You have a fever. MAKE SURE YOU:   Understand these instructions.  Will watch your condition.  Will get help right away if you are not doing well or get worse. Document Released: 06/30/2005 Document Revised: 09/22/2011 Document Reviewed: 11/27/2010 [facility] Patient Information 2015 [place], [place]. This information is not intended to replace advice given to you by your health care provider. Make sure you discuss any questions you have with your health care provider. First Trimester of Pregnancy The first trimester of pregnancy is from week 1 until the end of week 12 (months 1 through 3). During this time, your baby will begin to develop inside you. At 6-8 weeks, the eyes and face are formed, and the heartbeat can be seen on ultrasound. At the end of 12 weeks, all the baby's organs are formed. Prenatal care is all the medical care you receive before the birth of your baby. Make sure you get good prenatal  care and follow all of your doctor's instructions. HOME CARE  Medicines  Take medicine only as told by your doctor. Some medicines are safe and some are not during pregnancy.  Take your prenatal vitamins as told by your doctor.  Take medicine that helps you poop (stool softener) as needed if your doctor says it is okay. Diet  Eat regular, healthy meals.  Your doctor will tell you the amount of weight gain that is right for you.  Avoid raw meat and uncooked cheese.  If you feel sick to your stomach (nauseous) or  throw up (vomit):  Eat 4 or 5 small meals a day instead of 3 large meals.  Try eating a few soda crackers.  Drink liquids between meals instead of during meals.  If you have a hard time pooping (constipation):  Eat high-fiber foods like fresh vegetables, fruit, and whole grains.  Drink enough fluids to keep your pee (urine) clear or pale yellow. Activity and Exercise  Exercise only as told by your doctor. Stop exercising if you have cramps or pain in your lower belly (abdomen) or low back.  Try to avoid standing for long periods of time. Move your legs often if you must stand in one place for a long time.  Avoid heavy lifting.  Wear low-heeled shoes. Sit and stand up straight.  You can have sex unless your doctor tells you not to. Relief of Pain or Discomfort  Wear a good support bra if your breasts are sore.  Take warm water baths (sitz baths) to soothe pain or discomfort caused by hemorrhoids. Use hemorrhoid cream if your doctor says it is okay.  Rest with your legs raised if you have leg cramps or low back pain.  Wear support hose if you have puffy, bulging veins (varicose veins) in your legs. Raise (elevate) your feet for 15 minutes, 3-4 times a day. Limit salt in your diet. Prenatal Care  Schedule your prenatal visits by the twelfth week of pregnancy.  Write down your questions. Take them to your prenatal visits.  Keep all your prenatal visits as told by your doctor. Safety  Wear your seat belt at all times when driving.  Make a list of emergency phone numbers. The list should include numbers for family, friends, the hospital, and police and fire departments. General Tips  Ask your doctor for a referral to a local prenatal class. Begin classes no later than at the start of month 6 of your pregnancy.  Ask for help if you need counseling or help with nutrition. Your doctor can give you advice or tell you where to go for help.  Do not use hot tubs, steam rooms, or  saunas.  Do not douche or use tampons or scented sanitary pads.  Do not cross your legs for long periods of time.  Avoid litter boxes and soil used by cats.  Avoid all smoking, herbs, and alcohol. Avoid drugs not approved by your doctor.  Visit your dentist. At home, brush your teeth with a soft toothbrush. Be gentle when you floss. GET HELP IF:  You are dizzy.  You have mild cramps or pressure in your lower belly.  You have a nagging pain in your belly area.  You continue to feel sick to your stomach, throw up, or have watery poop (diarrhea).  You have a bad smelling fluid coming from your vagina.  You have pain with peeing (urination).  You have increased puffiness (swelling) in your face, hands, legs, or  ankles. GET HELP RIGHT AWAY IF:   You have a fever.  You are leaking fluid from your vagina.  You have spotting or bleeding from your vagina.  You have very bad belly cramping or pain.  You gain or lose weight rapidly.  You throw up blood. It may look like coffee grounds.  You are around people who have [place] measles, fifth disease, or chickenpox.  You have a very bad headache.  You have shortness of breath.  You have any kind of trauma, such as from a fall or a car accident. Document Released: 12/17/2007 Document Revised: 11/14/2013 Document Reviewed: 05/10/2013 [facility] Patient Information 2015 [place], [place]. This information is not intended to replace advice given to you by your health care provider. Make sure you discuss any questions you have with your health care provider.

## 2014-02-20 LAB — CULTURE, OB URINE
Colony Count: 5000
Special Requests: NORMAL

## 2014-02-20 NOTE — MAU Provider Note (Signed)
Attestation of Attending Supervision of Advanced Practitioner (CNM/NP): Evaluation and management procedures were performed by the Advanced Practitioner under my supervision and collaboration. I have reviewed the Advanced Practitioner's note and chart, and I agree with the management and plan.  Carmina Walle H. 6:29 AM

## 2014-02-21 LAB — GC/CHLAMYDIA PROBE AMP
CT PROBE, AMP APTIMA: NEGATIVE
GC Probe RNA: NEGATIVE

## 2014-03-15 ENCOUNTER — Emergency Department (HOSPITAL_COMMUNITY)
Admission: EM | Admit: 2014-03-15 | Discharge: 2014-03-15 | Disposition: A | Discharge status: Home / Self Care | Payer: Medicaid Other | Attending: Emergency Medicine | Admitting: Emergency Medicine

## 2014-03-15 ENCOUNTER — Encounter (HOSPITAL_COMMUNITY): Payer: Self-pay | Admitting: Emergency Medicine

## 2014-03-15 DIAGNOSIS — R21 Rash and other nonspecific skin eruption: Secondary | ICD-10-CM | POA: Insufficient documentation

## 2014-03-15 DIAGNOSIS — Z880 Allergy status to penicillin: Secondary | ICD-10-CM | POA: Insufficient documentation

## 2014-03-15 MED ORDER — PERMETHRIN 5 % EX CREA: TOPICAL_CREAM | CUTANEOUS | Status: DC

## 2014-03-15 MED ORDER — TRIAMCINOLONE ACETONIDE 0.1 % EX CREA: 1.0000 "application " | TOPICAL_CREAM | Freq: Two times a day (BID) | CUTANEOUS | Status: DC

## 2014-03-15 NOTE — ED Notes (Signed)
PA at bedside.

## 2014-03-15 NOTE — ED Notes (Signed)
PT ambulated with baseline gait; VSS; A&Ox3; no signs of distress; respirations even and unlabored; skin warm and dry; no questions upon discharge.  

## 2014-03-15 NOTE — Discharge Instructions (Signed)

## 2014-03-15 NOTE — ED Notes (Signed)
Pt in c/o rash since yesterday, first noted to inside of arms and today noted on torso and thighs, no distress

## 2014-03-15 NOTE — ED Provider Notes (Signed)
CSN: [id number]     Arrival date & time 03/15/14  2102 History  This chart was scribed for Charlann Lange, PA-C working with Francine Graven, DO by Randa Evens, ED Scribe. This patient was seen in room TR08C/TR08C and the patient's care was started at 9:15 PM.    Chief Complaint  Patient presents with  . Rash   Patient is a 38 y.o. female presenting with rash. The history is provided by the patient. No language interpreter was used.  Rash Associated symptoms: no fever    HPI Comments: Amber White is a 38 y.o. female who presents to the Emergency Department complaining of gradually worsening rash onset 3 days prior. She states the rash is on her arms, thighs and torso. She describes the rash as itchy. She states she has been applying hydrocortisone cream with no relief. She state she has been taking benadryl with no relief. She denies fever. She denies any recent contacts with anyone with similar symptoms.    Past Medical History  Diagnosis Date  . No pertinent past medical history    Past Surgical History  Procedure Laterality Date  . Cholecystectomy     Family History  Problem Relation Age of Onset  . Cancer Mother   . Diabetes Maternal Uncle   . Diabetes Maternal Grandmother   . Heart disease Maternal Grandmother    History  Substance Use Topics  . Smoking status: Never Smoker   . Smokeless tobacco: Never Used  . Alcohol Use: Yes     Comment: occassional    OB History   Grav Para Term Preterm Abortions TAB SAB Ect Mult Living   6 4   1 1    4      Review of Systems  Constitutional: Negative for fever.  Skin: Positive for rash.   Allergies  Penicillins  Home Medications   Prior to Admission medications   Medication Sig Start Date End Date Taking? Authorizing Provider  acetaminophen (TYLENOL) 500 MG tablet Take 1,000 mg by mouth every 6 (six) hours as needed.    Historical Provider, MD  metoCLOPramide (REGLAN) 10 MG tablet Take 1 tablet (10 mg total) by mouth  every 6 (six) hours. 02/19/14   Olegario Messier, NP   Triage Vitals: BP 129/74  Pulse 83  Temp(Src) 98.5 F (36.9 C) (Oral)  Resp 18  SpO2 98%  LMP 08/03/2013  Physical Exam  Nursing note and vitals reviewed. Constitutional: She is oriented to person, place, and time. She appears well-developed and well-nourished. No distress.  HENT:  Head: Normocephalic and atraumatic.  Eyes: Conjunctivae and EOM are normal.  Neck: Neck supple. No tracheal deviation present.  Cardiovascular: Normal rate.   Pulmonary/Chest: Effort normal. No respiratory distress.  Musculoskeletal: Normal range of motion.  Neurological: She is alert and oriented to person, place, and time.  Skin: Skin is warm and dry. Rash noted. Rash is maculopapular.  maculopapular that is sparse and located on anticubital upper extremities anterior chest and lower extremities bilateral, no blistering, lesions are pinpoint size and slightly red.  Psychiatric: She has a normal mood and affect. Her behavior is normal.    ED Course  Procedures (including critical care time) DIAGNOSTIC STUDIES: Oxygen Saturation is 98% on RA, normal by my interpretation.    Labs Review Labs Reviewed - No data to display  Imaging Review No results found.   EKG Interpretation None      MDM   Final diagnoses:  None   1. Nonspecific  rash  She has used topical steroids, benadryl without change in symptoms. Differential includes scabies. Rx for Elimte provided but patient cannot afford. Will Rx stronger steroid cream for symptomatic treatment and dermatology referral.    I personally performed the services described in this documentation, which was scribed in my presence. The recorded information has been reviewed and is accurate.       Dewaine Oats, PA-C 03/19/14 [phone]

## 2014-03-19 NOTE — ED Provider Notes (Signed)
Medical screening examination/treatment/procedure(s) were performed by non-physician practitioner and as supervising physician I was immediately available for consultation/collaboration.   EKG Interpretation None        Francine Graven, DO 03/19/14 1625

## 2014-04-09 ENCOUNTER — Inpatient Hospital Stay (HOSPITAL_COMMUNITY)
Admission: AD | Admit: 2014-04-09 | Discharge: 2014-04-09 | Disposition: A | Discharge status: Home / Self Care | Payer: Medicaid Other | Source: Ambulatory Visit | Attending: Obstetrics and Gynecology | Admitting: Obstetrics and Gynecology

## 2014-04-09 ENCOUNTER — Encounter (HOSPITAL_COMMUNITY): Payer: Self-pay

## 2014-04-09 DIAGNOSIS — Z880 Allergy status to penicillin: Secondary | ICD-10-CM | POA: Insufficient documentation

## 2014-04-09 DIAGNOSIS — B3731 Acute candidiasis of vulva and vagina: Secondary | ICD-10-CM | POA: Insufficient documentation

## 2014-04-09 DIAGNOSIS — B373 Candidiasis of vulva and vagina: Secondary | ICD-10-CM

## 2014-04-09 LAB — URINALYSIS, ROUTINE W REFLEX MICROSCOPIC
BILIRUBIN URINE: NEGATIVE
Glucose, UA: NEGATIVE mg/dL
Ketones, ur: 15 mg/dL — AB
NITRITE: NEGATIVE
PH: 5.5 (ref 5.0–8.0)
PROTEIN: NEGATIVE mg/dL
Specific Gravity, Urine: 1.025 (ref 1.005–1.030)
Urobilinogen, UA: 0.2 mg/dL (ref 0.0–1.0)

## 2014-04-09 LAB — WET PREP, GENITAL: TRICH WET PREP: NONE SEEN

## 2014-04-09 LAB — URINE MICROSCOPIC-ADD ON

## 2014-04-09 NOTE — MAU Provider Note (Signed)
History     CSN: [id number]  Arrival date and time: 04/09/14 2224   First Provider Initiated Contact with Patient 04/09/14 2307      Chief Complaint  Patient presents with  . Vaginal Discharge  . Vaginal Itching   Vaginal Discharge The patient's primary symptoms include a vaginal discharge.  Vaginal Itching The patient's primary symptoms include a vaginal discharge.   Amber White is a 38 y.o. [id number] who presents today with vaginal discharge. She states that the discharge started on Friday. She states that it is thick, and very irritating.   Past Medical History  Diagnosis Date  . No pertinent past medical history     Past Surgical History  Procedure Laterality Date  . Cholecystectomy      Family History  Problem Relation Age of Onset  . Cancer Mother   . Diabetes Maternal Uncle   . Diabetes Maternal Grandmother   . Heart disease Maternal Grandmother     History  Substance Use Topics  . Smoking status: Never Smoker   . Smokeless tobacco: Never Used  . Alcohol Use: Yes     Comment: occassional     Allergies:  Allergies  Allergen Reactions  . Penicillins Hives    Prescriptions prior to admission  Medication Sig Dispense Refill  . ciprofloxacin (CIPRO) 500 MG tablet Take 500 mg by mouth 2 (two) times daily.      Marland Kitchen triamcinolone cream (KENALOG) 0.1 % Apply 1 application topically 2 (two) times daily.  30 g  0  . acetaminophen (TYLENOL) 500 MG tablet Take 1,000 mg by mouth every 6 (six) hours as needed.      . metoCLOPramide (REGLAN) 10 MG tablet Take 1 tablet (10 mg total) by mouth every 6 (six) hours.  30 tablet  0  . permethrin (ELIMITE) 5 % cream Apply from the neck down at night and wash off in the morning. May repeat in one week if symptoms persist.  60 g  0    Review of Systems  Genitourinary: Positive for vaginal discharge.   Physical Exam   Blood pressure 116/78, pulse 89, temperature 98.5 F (36.9 C), temperature source Oral, resp. rate 18,  height 5\' 4"  (1.626 m), weight 134.718 kg (297 lb), last menstrual period 03/28/2014, unknown if currently breastfeeding.  Physical Exam  Nursing note and vitals reviewed. Constitutional: She is oriented to person, place, and time. She appears well-developed and well-nourished. No distress.  Cardiovascular: Normal rate.   Respiratory: Effort normal.  GI: Soft. There is no tenderness. There is no rebound.  Genitourinary:   External: no lesion Vagina: large amount of thick, white, adherent discharge Cervix: pink, smooth, no CMT Uterus: NSSC Adnexa: NT   Neurological: She is alert and oriented to person, place, and time.  Skin: Skin is warm and dry.  Psychiatric: She has a normal mood and affect.    MAU Course  Procedures  Results for orders placed during the hospital encounter of 04/09/14 (from the past 24 hour(s))  URINALYSIS, ROUTINE W REFLEX MICROSCOPIC     Status: Abnormal   Collection Time    04/09/14 10:30 PM      Result Value Ref Range   Color, Urine YELLOW  YELLOW   APPearance CLOUDY (*) CLEAR   Specific Gravity, Urine 1.025  1.005 - 1.030   pH 5.5  5.0 - 8.0   Glucose, UA NEGATIVE  NEGATIVE mg/dL   Hgb urine dipstick MODERATE (*) NEGATIVE   Bilirubin Urine NEGATIVE  NEGATIVE   Ketones, ur 15 (*) NEGATIVE mg/dL   Protein, ur NEGATIVE  NEGATIVE mg/dL   Urobilinogen, UA 0.2  0.0 - 1.0 mg/dL   Nitrite NEGATIVE  NEGATIVE   Leukocytes, UA MODERATE (*) NEGATIVE  URINE MICROSCOPIC-ADD ON     Status: Abnormal   Collection Time    04/09/14 10:30 PM      Result Value Ref Range   Squamous Epithelial / LPF FEW (*) RARE   WBC, UA 11-20  <3 WBC/hpf   RBC / HPF 7-10  <3 RBC/hpf   Bacteria, UA FEW (*) RARE   Urine-Other YEAST    WET PREP, GENITAL     Status: Abnormal   Collection Time    04/09/14 11:10 PM      Result Value Ref Range   Yeast Wet Prep HPF POC MODERATE (*) NONE SEEN   Trich, Wet Prep NONE SEEN  NONE SEEN   Clue Cells Wet Prep HPF POC FEW (*) NONE SEEN    WBC, Wet Prep HPF POC MODERATE (*) NONE SEEN    Assessment and Plan   1. Yeast infection involving the vagina and surrounding area    7 ay OTC yeast infection treatment Return to MAU as needed  Follow-up Information   Follow up with [facility]. (As needed)    Specialty:  Internal Medicine   Contact information:   [place] [address] [phone]        Mathis Bud 04/09/2014, 11:14 PM

## 2014-04-09 NOTE — Discharge Instructions (Signed)
USE 7 DAY OVER THE COUNTER YEAST INFECTION TREATMENT. MONISTAT 7   Candidal Vulvovaginitis Candidal vulvovaginitis is an infection of the vagina and vulva. The vulva is the skin around the opening of the vagina. This may cause itching and discomfort in and around the vagina.  HOME CARE  Only take medicine as told by your doctor.  Do not have sex (intercourse) until the infection is healed or as told by your doctor.  Practice safe sex.  Tell your sex partner about your infection.  Do not douche or use tampons.  Wear cotton underwear. Do not wear tight pants or panty hose.  Eat yogurt. This may help treat and prevent yeast infections. GET HELP RIGHT AWAY IF:   You have a fever.  Your problems get worse during treatment or do not get better in 3 days.  You have discomfort, irritation, or itching in your vagina or vulva area.  You have pain after sex.  You start to get belly (abdominal) pain. MAKE SURE YOU:  Understand these instructions.  Will watch your condition.  Will get help right away if you are not doing well or get worse. Document Released: 09/26/2008 Document Revised: 07/05/2013 Document Reviewed: 09/26/2008 [facility] Patient Information 2015 [place], [place]. This information is not intended to replace advice given to you by your health care provider. Make sure you discuss any questions you have with your health care provider.

## 2014-04-09 NOTE — MAU Note (Signed)
Pt c/o greenish discharge that she noticed on Friday. States it itches really bad and has a slight odor. Has some pain with urination and frequency. Denies fever or chills.

## 2014-04-10 NOTE — MAU Provider Note (Signed)
Attestation of Attending Supervision of Advanced Practitioner: Evaluation and management procedures were performed by the PA/NP/CNM/OB Fellow under my supervision/collaboration. Chart reviewed and agree with management and plan.  Alizea Pell V 04/10/2014 6:00 PM

## 2014-05-15 ENCOUNTER — Encounter (HOSPITAL_COMMUNITY): Payer: Self-pay

## 2017-12-09 ENCOUNTER — Emergency Department (HOSPITAL_BASED_OUTPATIENT_CLINIC_OR_DEPARTMENT_OTHER)
Admission: EM | Admit: 2017-12-09 | Discharge: 2017-12-10 | Disposition: A | Discharge status: Home / Self Care | Payer: Self-pay | Attending: Emergency Medicine | Admitting: Emergency Medicine

## 2017-12-09 ENCOUNTER — Encounter (HOSPITAL_BASED_OUTPATIENT_CLINIC_OR_DEPARTMENT_OTHER): Payer: Self-pay | Admitting: Emergency Medicine

## 2017-12-09 ENCOUNTER — Other Ambulatory Visit: Payer: Self-pay

## 2017-12-09 DIAGNOSIS — N898 Other specified noninflammatory disorders of vagina: Secondary | ICD-10-CM | POA: Insufficient documentation

## 2017-12-09 DIAGNOSIS — N760 Acute vaginitis: Secondary | ICD-10-CM | POA: Insufficient documentation

## 2017-12-09 DIAGNOSIS — B9689 Other specified bacterial agents as the cause of diseases classified elsewhere: Secondary | ICD-10-CM | POA: Insufficient documentation

## 2017-12-09 LAB — URINALYSIS, ROUTINE W REFLEX MICROSCOPIC
Bilirubin Urine: NEGATIVE
Glucose, UA: NEGATIVE mg/dL
Hgb urine dipstick: NEGATIVE
Ketones, ur: NEGATIVE mg/dL
Leukocytes, UA: NEGATIVE
NITRITE: NEGATIVE
PH: 5.5 (ref 5.0–8.0)
Protein, ur: NEGATIVE mg/dL

## 2017-12-09 LAB — PREGNANCY, URINE: Preg Test, Ur: NEGATIVE

## 2017-12-09 NOTE — ED Triage Notes (Signed)
Pt reports vaginal itching over the weekend and used Monistat OTC and now has heavy discharge.

## 2017-12-09 NOTE — ED Notes (Signed)
Denies abd pain.

## 2017-12-10 LAB — WET PREP, GENITAL
SPERM: NONE SEEN
TRICH WET PREP: NONE SEEN
Yeast Wet Prep HPF POC: NONE SEEN

## 2017-12-10 LAB — GC/CHLAMYDIA PROBE AMP (~~LOC~~) NOT AT ARMC
Chlamydia: NEGATIVE
Neisseria Gonorrhea: NEGATIVE

## 2017-12-10 MED ORDER — METRONIDAZOLE 500 MG PO TABS
500.0000 mg | ORAL_TABLET | Freq: Once | ORAL | Status: AC
  Administered 2017-12-10: 500 mg via ORAL
  Filled 2017-12-10: qty 1

## 2017-12-10 MED ORDER — METRONIDAZOLE 500 MG PO TABS: 500.0000 mg | ORAL_TABLET | Freq: Two times a day (BID) | ORAL | 0 refills | Status: DC

## 2017-12-10 MED ORDER — FLUCONAZOLE 50 MG PO TABS
150.0000 mg | ORAL_TABLET | Freq: Once | ORAL | Status: AC
  Administered 2017-12-10: 150 mg via ORAL
  Filled 2017-12-10: qty 1

## 2017-12-10 NOTE — ED Provider Notes (Signed)
[facility] Provider Note: Amber Spurling, MD, FACEP  CSN: [id number] MRN: [id number] ARRIVAL: 12/09/17 at [place]: [place]  Vaginal Discharge   HISTORY OF PRESENT ILLNESS  12/10/17 12:14 AM Amber White is a 42 y.o. female with a one-week history of vaginal itching and a white discharge.  She states the itching is severe.  She is tried Monistat 1 dose over-the-counter without relief.  She is not having vaginal bleeding.  She is not having abdominal pain.    Past Medical History:  Diagnosis Date  . No pertinent past medical history     Past Surgical History:  Procedure Laterality Date  . CHOLECYSTECTOMY      Family History  Problem Relation Age of Onset  . Cancer Mother   . Diabetes Maternal Uncle   . Diabetes Maternal Grandmother   . Heart disease Maternal Grandmother     Social History   Tobacco Use  . Smoking status: Never Smoker  . Smokeless tobacco: Never Used  Substance Use Topics  . Alcohol use: Yes    Comment: occassional   . Drug use: No    Prior to Admission medications   Medication Sig Start Date End Date Taking? Authorizing Provider  acetaminophen (TYLENOL) 500 MG tablet Take 1,000 mg by mouth every 6 (six) hours as needed.    [provider]  ciprofloxacin (CIPRO) 500 MG tablet Take 500 mg by mouth 2 (two) times daily.    [provider]  metoCLOPramide (REGLAN) 10 MG tablet Take 1 tablet (10 mg total) by mouth every 6 (six) hours. 02/19/14   Barefoot, Connye Burkitt, NP  permethrin (ELIMITE) 5 % cream Apply from the neck down at night and wash off in the morning. May repeat in one week if symptoms persist. 03/15/14   Charlann Lange, PA-C  triamcinolone cream (KENALOG) 0.1 % Apply 1 application topically 2 (two) times daily. 03/15/14   Charlann Lange, PA-C    Allergies Penicillins   REVIEW OF SYSTEMS  Negative except as noted here or in the History of Present Illness.   PHYSICAL EXAMINATION  Initial  Vital Signs Blood pressure 127/88, pulse 90, temperature 98 F (36.7 C), temperature source Oral, resp. rate 16, height 5\' 4"  (1.626 m), weight 131.5 kg (290 lb), last menstrual period 11/29/2017, SpO2 100 %, unknown if currently breastfeeding.  Examination General: Well-developed, well-nourished female in no acute distress; appearance consistent with age of record HENT: normocephalic; atraumatic Eyes: pupils equal, round and reactive to light; extraocular muscles intact Neck: supple Heart: regular rate and rhythm Lungs: clear to auscultation bilaterally Abdomen: soft; nondistended; nontender; bowel sounds present GU: No CVA tenderness; white vaginal discharge; no vaginal bleeding; no cervical motion tenderness; no adnexal tenderness Extremities: No deformity; full range of motion; pulses normal Neurologic: Awake, alert and oriented; motor function intact in all extremities and symmetric; no facial droop Skin: Warm and dry Psychiatric: Normal mood and affect   RESULTS  Summary of this visit's results, reviewed by myself:   EKG Interpretation  Date/Time:    Ventricular Rate:    PR Interval:    QRS Duration:   QT Interval:    QTC Calculation:   R Axis:     Text Interpretation:        Laboratory Studies: Results for orders placed or performed during the hospital encounter of 12/09/17 (from the past 24 hour(s))  Urinalysis, Routine w reflex microscopic     Status: Abnormal   Collection Time:  12/09/17 11:12 PM  Result Value Ref Range   Color, Urine YELLOW YELLOW   APPearance CLEAR CLEAR   Specific Gravity, Urine >1.030 (H) 1.005 - 1.030   pH 5.5 5.0 - 8.0   Glucose, UA NEGATIVE NEGATIVE mg/dL   Hgb urine dipstick NEGATIVE NEGATIVE   Bilirubin Urine NEGATIVE NEGATIVE   Ketones, ur NEGATIVE NEGATIVE mg/dL   Protein, ur NEGATIVE NEGATIVE mg/dL   Nitrite NEGATIVE NEGATIVE   Leukocytes, UA NEGATIVE NEGATIVE  Pregnancy, urine     Status: None   Collection Time: 12/09/17 11:12  PM  Result Value Ref Range   Preg Test, Ur NEGATIVE NEGATIVE  Wet prep, genital     Status: Abnormal   Collection Time: 12/10/17 12:15 AM  Result Value Ref Range   Yeast Wet Prep HPF POC NONE SEEN NONE SEEN   Trich, Wet Prep NONE SEEN NONE SEEN   Clue Cells Wet Prep HPF POC PRESENT (A) NONE SEEN   WBC, Wet Prep HPF POC MODERATE (A) NONE SEEN   Sperm NONE SEEN    Imaging Studies: No results found.  ED COURSE and MDM  Nursing notes and initial vitals signs, including pulse oximetry, reviewed.  Vitals:   12/09/17 2310  BP: 127/88  Pulse: 90  Resp: 16  Temp: 98 F (36.7 C)  TempSrc: Oral  SpO2: 100%  Weight: 131.5 kg (290 lb)  Height: 5\' 4"  (1.626 m)   12:38 AM Patient symptoms are more consistent with yeast infection.  We will treat with Diflucan.  She has clue cells so we will also treat for bacterial vaginosis.  PROCEDURES    ED DIAGNOSES     ICD-10-CM   1. Vaginal itching N89.8   2. Bacterial vaginosis N76.0    B96.89        Dorie Ohms, Jenny Reichmann, MD 12/10/17 [phone]

## 2018-03-13 ENCOUNTER — Other Ambulatory Visit: Payer: Self-pay

## 2018-03-13 ENCOUNTER — Encounter (HOSPITAL_BASED_OUTPATIENT_CLINIC_OR_DEPARTMENT_OTHER): Payer: Self-pay | Admitting: *Deleted

## 2018-03-13 ENCOUNTER — Emergency Department (HOSPITAL_BASED_OUTPATIENT_CLINIC_OR_DEPARTMENT_OTHER)
Admission: EM | Admit: 2018-03-13 | Discharge: 2018-03-13 | Disposition: A | Discharge status: Home / Self Care | Payer: Self-pay | Attending: Emergency Medicine | Admitting: Emergency Medicine

## 2018-03-13 DIAGNOSIS — L739 Follicular disorder, unspecified: Secondary | ICD-10-CM | POA: Insufficient documentation

## 2018-03-13 MED ORDER — DOXYCYCLINE HYCLATE 100 MG PO CAPS
100.0000 mg | ORAL_CAPSULE | Freq: Two times a day (BID) | ORAL | 0 refills | Status: DC
Start: 2018-03-13 — End: 2019-02-25

## 2018-03-13 NOTE — Discharge Instructions (Signed)
Take the antibiotics as prescribed, you can also try applying over the counter antibacterial ointment, warm soaks to help keep the area clean, follow up with a primary care doctor next week if it is not improving/resolved

## 2018-03-13 NOTE — ED Triage Notes (Signed)
Pt c/o rash to buttocks x 3 days 

## 2018-03-13 NOTE — ED Provider Notes (Signed)
[facility] EMERGENCY DEPARTMENT Provider Note   CSN: [id number] Arrival date & time: 03/13/18  3536     History   Chief Complaint Chief Complaint  Patient presents with  . Rash    HPI Amber White is a 42 y.o. female.  HPI Patient presents to the emergency room for evaluation of a painful rash on her buttocks.  Patient states she noticed it about 3 days ago.  The rash is on the lower aspect primarily on the left buttock.  Patient states she is not really able to see it but it is sore and tender to the touch.  She noticed some blood on her underwear this morning so she came into be evaluated.  Patient states she does have a history of getting painful bumps in her axillary region and this feels somewhat similar.  She denies any fevers or chills.  No vomiting or diarrhea.  No numbness or weakness. Past Medical History:  Diagnosis Date  . No pertinent past medical history     Patient Active Problem List   Diagnosis Date Noted  . Pregnancy 02/19/2014  . Morbid obesity ([place]) 02/19/2014  . Dermoid cyst of left ovary 02/19/2014    Past Surgical History:  Procedure Laterality Date  . CHOLECYSTECTOMY       OB History    Gravida  6   Para  4   Term      Preterm      AB  1   Living  4     SAB      TAB  1   Ectopic      Multiple      Live Births               Home Medications    Prior to Admission medications   Medication Sig Start Date End Date Taking? Authorizing Provider  doxycycline (VIBRAMYCIN) 100 MG capsule Take 1 capsule (100 mg total) by mouth 2 (two) times daily. 03/13/18   Dorie Rank, MD    Family History Family History  Problem Relation Age of Onset  . Cancer Mother   . Diabetes Maternal Uncle   . Diabetes Maternal Grandmother   . Heart disease Maternal Grandmother     Social History Social History   Tobacco Use  . Smoking status: Never Smoker  . Smokeless tobacco: Never Used  Substance Use Topics  . Alcohol use: Yes     Comment: occassional   . Drug use: No     Allergies   Penicillins   Review of Systems Review of Systems  All other systems reviewed and are negative.    Physical Exam Updated Vital Signs BP 131/72   Pulse 88   Temp 98.3 F (36.8 C)   Resp 16   Ht 1.626 m (5\' 4" )   Wt 125.6 kg   LMP 02/26/2018   SpO2 100%   BMI 47.55 kg/m   Physical Exam  Constitutional: She appears well-developed and well-nourished. No distress.  HENT:  Head: Normocephalic and atraumatic.  Right Ear: External ear normal.  Left Ear: External ear normal.  Eyes: Conjunctivae are normal. Right eye exhibits no discharge. Left eye exhibits no discharge. No scleral icterus.  Neck: Neck supple. No tracheal deviation present.  Cardiovascular: Normal rate.  Pulmonary/Chest: Effort normal. No stridor. No respiratory distress.  Abdominal: She exhibits no distension.  Musculoskeletal: She exhibits no edema.  Neurological: She is alert. Cranial nerve deficit: no gross deficits.  Skin: Skin is  warm and dry. Rash noted.  Inferior aspect of left buttock in the skin fold there is a small less than 1 cm area of inflamed skin follicle and superficial ulceration of the skin, evidence of prior scarring in several follicles  Psychiatric: She has a normal mood and affect.  Nursing note and vitals reviewed.    ED Treatments / Results    Procedures Procedures (including critical care time)  Medications Ordered in ED Medications - No data to display   Initial Impression / Assessment and Plan / ED Course  I have reviewed the triage vital signs and the nursing notes.  Pertinent labs & imaging results that were available during my care of the patient were reviewed by me and considered in my medical decision making (see chart for details).     Symptoms are consistent with a small folliculitis.  Recommended warm compresses and topical antibiotics.  I will give her a short course of doxycycline.  Final Clinical  Impressions(s) / ED Diagnoses   Final diagnoses:  Folliculitis    ED Discharge Orders         Ordered    doxycycline (VIBRAMYCIN) 100 MG capsule  2 times daily     03/13/18 9977           Dorie Rank, MD 03/13/18 [phone]

## 2018-03-13 NOTE — ED Notes (Signed)
ED Provider at bedside. 

## 2018-07-30 ENCOUNTER — Other Ambulatory Visit: Payer: Self-pay

## 2018-07-30 ENCOUNTER — Encounter (HOSPITAL_BASED_OUTPATIENT_CLINIC_OR_DEPARTMENT_OTHER): Payer: Self-pay

## 2018-07-30 ENCOUNTER — Emergency Department (HOSPITAL_BASED_OUTPATIENT_CLINIC_OR_DEPARTMENT_OTHER)
Admission: EM | Admit: 2018-07-30 | Discharge: 2018-07-30 | Disposition: A | Discharge status: Home / Self Care | Payer: 59 | Attending: Emergency Medicine | Admitting: Emergency Medicine

## 2018-07-30 DIAGNOSIS — J3489 Other specified disorders of nose and nasal sinuses: Secondary | ICD-10-CM | POA: Diagnosis not present

## 2018-07-30 DIAGNOSIS — H5789 Other specified disorders of eye and adnexa: Secondary | ICD-10-CM | POA: Diagnosis present

## 2018-07-30 DIAGNOSIS — R0981 Nasal congestion: Secondary | ICD-10-CM | POA: Diagnosis not present

## 2018-07-30 DIAGNOSIS — R067 Sneezing: Secondary | ICD-10-CM

## 2018-07-30 DIAGNOSIS — H109 Unspecified conjunctivitis: Secondary | ICD-10-CM | POA: Insufficient documentation

## 2018-07-30 MED ORDER — FLUORESCEIN SODIUM 1 MG OP STRP
1.0000 | ORAL_STRIP | Freq: Once | OPHTHALMIC | Status: AC
  Administered 2018-07-30: 1 via OPHTHALMIC
  Filled 2018-07-30: qty 1

## 2018-07-30 MED ORDER — POLYMYXIN B-TRIMETHOPRIM 10000-0.1 UNIT/ML-% OP SOLN: 2.0000 [drp] | Freq: Four times a day (QID) | OPHTHALMIC | 0 refills | Status: AC

## 2018-07-30 MED ORDER — CETIRIZINE-PSEUDOEPHEDRINE ER 5-120 MG PO TB12: 1.0000 | ORAL_TABLET | Freq: Two times a day (BID) | ORAL | 0 refills | Status: DC

## 2018-07-30 NOTE — Discharge Instructions (Signed)
Use 1 to 2 drops of Polytrim every 6 hours for 7 days.  Take Zyrtec 1-2 times daily for nasal congestion and sneezing.  Please follow-up with the ophthalmologist if your eye symptoms are not improving by Monday.  Please return the emergency department you develop any new or worsening symptoms.

## 2018-07-30 NOTE — ED Triage Notes (Signed)
C/o redness, irritation to right eye x today-NAD-steady gait

## 2018-07-30 NOTE — ED Notes (Signed)
Signature pad in room not working. Pt provided verbal understanding of discharge instructions with teach back.  

## 2018-07-31 NOTE — ED Provider Notes (Signed)
[facility] EMERGENCY DEPARTMENT Provider Note   CSN: [id number] Arrival date & time: 07/30/18  2125     History   Chief Complaint Chief Complaint  Patient presents with  . Conjunctivitis    HPI Amber White is a 43 y.o. female who presents with a 1 day history of right eye redness, nasal congestion and rhinorrhea.  Patient reports she woke up with the eye draining clear fluid.  She denies any vision changes.  She has had some irritation, but no significant pain.  She denies wearing contacts.  She does wear glasses, but does not have them with her.  She denies any trauma to the eye.  She woke up with it red like this.  Later in the day, she developed the URI symptoms with associated sneezing.  She did not take any medications over-the-counter for symptoms.  HPI  Past Medical History:  Diagnosis Date  . No pertinent past medical history     Patient Active Problem List   Diagnosis Date Noted  . Pregnancy 02/19/2014  . Morbid obesity ([place]) 02/19/2014  . Dermoid cyst of left ovary 02/19/2014    Past Surgical History:  Procedure Laterality Date  . CHOLECYSTECTOMY       OB History    Gravida  6   Para  4   Term      Preterm      AB  1   Living  4     SAB      TAB  1   Ectopic      Multiple      Live Births               Home Medications    Prior to Admission medications   Medication Sig Start Date End Date Taking? Authorizing Provider  cetirizine-pseudoephedrine (ZYRTEC-D) 5-120 MG tablet Take 1 tablet by mouth 2 (two) times daily. 07/30/18   Caiden Monsivais, Bea Graff, PA-C  doxycycline (VIBRAMYCIN) 100 MG capsule Take 1 capsule (100 mg total) by mouth 2 (two) times daily. 03/13/18   Dorie Rank, MD  trimethoprim-polymyxin b (POLYTRIM) ophthalmic solution Place 2 drops into the right eye every 6 (six) hours for 7 days. 07/30/18 08/06/18  Frederica Kuster, PA-C    Family History Family History  Problem Relation Age of Onset  . Cancer Mother   .  Diabetes Maternal Uncle   . Diabetes Maternal Grandmother   . Heart disease Maternal Grandmother     Social History Social History   Tobacco Use  . Smoking status: Never Smoker  . Smokeless tobacco: Never Used  Substance Use Topics  . Alcohol use: Yes    Comment: rare  . Drug use: No     Allergies   Penicillins   Review of Systems Review of Systems  Constitutional: Negative for chills and fever.  HENT: Positive for congestion, rhinorrhea and sneezing.   Eyes: Positive for discharge and redness. Negative for photophobia and visual disturbance.  Respiratory: Negative for cough.      Physical Exam Updated Vital Signs BP 124/86 (BP Location: Left Arm)   Pulse 80   Temp 98.3 F (36.8 C) (Oral)   Resp 20   Ht 5\' 4"  (1.626 m)   Wt (!) 136.5 kg   LMP 07/14/2018   SpO2 100%   BMI 51.65 kg/m   Physical Exam Vitals signs and nursing note reviewed.  Constitutional:      General: She is not in acute distress.    Appearance:  She is well-developed. She is not diaphoretic.  HENT:     Head: Normocephalic and atraumatic.     Right Ear: Tympanic membrane normal.     Left Ear: Tympanic membrane normal.     Mouth/Throat:     Pharynx: No oropharyngeal exudate.  Eyes:     General: No scleral icterus.       Right eye: No discharge.        Left eye: No discharge.     Conjunctiva/sclera: Conjunctivae normal.     Pupils: Pupils are equal, round, and reactive to light.     Comments: Right eye with mild conjunctival injection, clear drainage, no photophobia or consensual photophobia, EOMs intact bilaterally, no uptake with fluorescein staining over the cornea  Visual Acuity (patient forgot glasses)  Right Eye Distance: 20/50(uncorrected) Left Eye Distance: 20/50(uncorrected) Bilateral Distance: 20/40(uncorrected)     Neck:     Musculoskeletal: Normal range of motion and neck supple.     Thyroid: No thyromegaly.  Cardiovascular:     Rate and Rhythm: Normal rate and regular  rhythm.     Heart sounds: Normal heart sounds. No murmur. No friction rub. No gallop.   Pulmonary:     Effort: Pulmonary effort is normal. No respiratory distress.     Breath sounds: Normal breath sounds. No stridor. No wheezing or rales.  Abdominal:     Palpations: Abdomen is soft.  Lymphadenopathy:     Cervical: No cervical adenopathy.  Skin:    General: Skin is warm and dry.     Coloration: Skin is not pale.     Findings: No rash.  Neurological:     Mental Status: She is alert.     Coordination: Coordination normal.      ED Treatments / Results  Labs (all labs ordered are listed, but only abnormal results are displayed) Labs Reviewed - No data to display  EKG None  Radiology No results found.  Procedures Procedures (including critical care time)  Medications Ordered in ED Medications  fluorescein ophthalmic strip 1 strip (1 strip Right Eye Given by Other 07/30/18 2220)     Initial Impression / Assessment and Plan / ED Course  I have reviewed the triage vital signs and the nursing notes.  Pertinent labs & imaging results that were available during my care of the patient were reviewed by me and considered in my medical decision making (see chart for details).     Patient with probable viral conjunctivitis considering the URI symptoms, however considering unilaterality, will cover with Polytrim.  Will also treat with Zyrtec-D.  Patient to follow-up to ophthalmology if symptoms do not resolving, however no signs of corneal abrasion or uveitis or iritis at this time.  Will discharge home with return precautions.  Patient understands and agrees with plan.  Patient vital stable throughout ED course and discharged in satisfactory condition.  Final Clinical Impressions(s) / ED Diagnoses   Final diagnoses:  Conjunctivitis of right eye, unspecified conjunctivitis type  Nasal congestion  Sneezing    ED Discharge Orders         Ordered    trimethoprim-polymyxin b  (POLYTRIM) ophthalmic solution  Every 6 hours     07/30/18 2230    cetirizine-pseudoephedrine (ZYRTEC-D) 5-120 MG tablet  2 times daily     07/30/18 2230           Frederica Kuster, [place] 07/31/18 1547    Maudie Flakes, MD 07/31/18 2311

## 2019-02-25 ENCOUNTER — Encounter: Payer: Self-pay | Admitting: Emergency Medicine

## 2019-02-25 ENCOUNTER — Other Ambulatory Visit: Payer: Self-pay

## 2019-02-25 ENCOUNTER — Ambulatory Visit
Admission: EM | Admit: 2019-02-25 | Discharge: 2019-02-25 | Disposition: A | Discharge status: Home / Self Care | Payer: 59 | Attending: Emergency Medicine | Admitting: Emergency Medicine

## 2019-02-25 DIAGNOSIS — Z20828 Contact with and (suspected) exposure to other viral communicable diseases: Secondary | ICD-10-CM

## 2019-02-25 DIAGNOSIS — Z20822 Contact with and (suspected) exposure to covid-19: Secondary | ICD-10-CM

## 2019-02-25 DIAGNOSIS — R030 Elevated blood-pressure reading, without diagnosis of hypertension: Secondary | ICD-10-CM

## 2019-02-25 NOTE — Discharge Instructions (Addendum)
Your COVID test is pending: You are okay to go to work as you are afebrile and asymptomatic, that you need to go home and isolate should he develop any symptoms, fever.  Should you develop symptoms, you may take OTC Tylenol for fever and myalgias.  It is important to drink plenty of water throughout the day to stay hydrated. If you test positive and later develop severe fever, cough, or shortness of breath, it is recommended that you go to the ER for further evaluation.

## 2019-02-25 NOTE — ED Triage Notes (Signed)
Pt presents to [facility] for assessment after a exposure on Tuesday to coworker who tested postive for COVID.  Denies any symptoms at this time.

## 2019-02-25 NOTE — ED Provider Notes (Signed)
[facility]    CSN: [id number] Arrival date & time: 02/25/19  1150     History   Chief Complaint Chief Complaint  Patient presents with  . COVID Exposure    HPI Amber White is a 43 y.o. female with history of morbid obesity presenting for cover testing.  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  Patient states her employer is requiring all employees who worked closely with this person to be tested.  Patient is adamant that she has not had any elevated temperatures, fevers, symptoms.    Past Medical History:  Diagnosis Date  . No pertinent past medical history     Patient Active Problem List   Diagnosis Date Noted  . Pregnancy 02/19/2014  . Morbid obesity ([place]) 02/19/2014  . Dermoid cyst of left ovary 02/19/2014    Past Surgical History:  Procedure Laterality Date  . CHOLECYSTECTOMY      OB History    Gravida  6   Para  4   Term      Preterm      AB  1   Living  4     SAB      TAB  1   Ectopic      Multiple      Live Births               Home Medications    Prior to Admission medications   Medication Sig Start Date End Date Taking? Authorizing Provider  cetirizine-pseudoephedrine (ZYRTEC-D) 5-120 MG tablet Take 1 tablet by mouth 2 (two) times daily. 07/30/18   Frederica Kuster, PA-C    Family History Family History  Problem Relation Age of Onset  . Cancer Mother   . Diabetes Maternal Uncle   . Diabetes Maternal Grandmother   . Heart disease Maternal Grandmother     Social History Social History   Tobacco Use  . Smoking status: Never Smoker  . Smokeless tobacco: Never Used  Substance Use Topics  . Alcohol use: Yes    Comment: rare  . Drug use: No     Allergies   Penicillins   Review of Systems Review of Systems  Constitutional: Negative for fatigue and fever.  HENT: Negative for ear  pain, sinus pain, sore throat and voice change.   Eyes: Negative for pain, redness and visual disturbance.  Respiratory: Negative for cough and shortness of breath.   Cardiovascular: Negative for chest pain and palpitations.  Gastrointestinal: Negative for abdominal pain, diarrhea and vomiting.  Musculoskeletal: Negative for arthralgias and myalgias.  Skin: Negative for rash and wound.  Neurological: Negative for syncope and headaches.     Physical Exam Triage Vital Signs ED Triage Vitals  Enc Vitals Group     BP      Pulse      Resp      Temp      Temp src      SpO2      Weight      Height      Head Circumference      Peak Flow      Pain Score      Pain Loc      Pain Edu?      Excl. in [place]?    No data found.  Updated Vital Signs BP 131/84 (BP Location: Left Arm)   Pulse 87  Temp 98.4 F (36.9 C) (Oral)   Resp 16   SpO2 98%   Visual Acuity Right Eye Distance:   Left Eye Distance:   Bilateral Distance:    Right Eye Near:   Left Eye Near:    Bilateral Near:     Physical Exam Constitutional:      General: She is not in acute distress.    Appearance: She is obese.  HENT:     Head: Normocephalic and atraumatic.  Eyes:     General: No scleral icterus.    Pupils: Pupils are equal, round, and reactive to light.  Cardiovascular:     Rate and Rhythm: Normal rate.  Pulmonary:     Effort: Pulmonary effort is normal.  Skin:    Coloration: Skin is not jaundiced or pale.  Neurological:     Mental Status: She is alert and oriented to person, place, and time.      UC Treatments / Results  Labs (all labs ordered are listed, but only abnormal results are displayed) Labs Reviewed  NOVEL CORONAVIRUS, NAA    EKG   Radiology No results found.  Procedures Procedures (including critical care time)  Medications Ordered in UC Medications - No data to display  Initial Impression / Assessment and Plan / UC Course  I have reviewed the triage vital signs and  the nursing notes.  Pertinent labs & imaging results that were available during my care of the patient were reviewed by me and considered in my medical decision making (see chart for details).     1.  Exposure to COVID-19 virus Cover test pending, work note provided: Patient able to go back as she is afebrile, asymptomatic with the caveat that she needs to immediately leave should she develop symptoms, or test positive.  Patient has facemask, appropriate PPE at work.  2.  Elevated blood pressure without diagnosis of hypertension Patient to follow-up with PCP regarding this: Asymptomatic in office today, not currently taking antihypertensives. Final Clinical Impressions(s) / UC Diagnoses   Final diagnoses:  Exposure to Covid-19 Virus  Elevated blood pressure reading without diagnosis of hypertension     Discharge Instructions     Your COVID test is pending: You are okay to go to work as you are afebrile and asymptomatic, that you need to go home and isolate should he develop any symptoms, fever.  Should you develop symptoms, you may take OTC Tylenol for fever and myalgias.  It is important to drink plenty of water throughout the day to stay hydrated. If you test positive and later develop severe fever, cough, or shortness of breath, it is recommended that you go to the ER for further evaluation.    ED Prescriptions    None     Controlled Substance Prescriptions [place] Controlled Substance Registry consulted? Not Applicable   Quincy Sheehan, [place] 02/25/19 1221

## 2019-02-25 NOTE — ED Notes (Signed)
Patient able to ambulate independently  

## 2019-02-26 LAB — NOVEL CORONAVIRUS, NAA: SARS-CoV-2, NAA: NOT DETECTED

## 2019-07-21 ENCOUNTER — Other Ambulatory Visit: Payer: Self-pay

## 2019-07-21 ENCOUNTER — Ambulatory Visit
Admission: EM | Admit: 2019-07-21 | Discharge: 2019-07-21 | Disposition: A | Discharge status: Home / Self Care | Payer: 59 | Attending: Emergency Medicine | Admitting: Emergency Medicine

## 2019-07-21 ENCOUNTER — Encounter: Payer: Self-pay | Admitting: Emergency Medicine

## 2019-07-21 DIAGNOSIS — Z20822 Contact with and (suspected) exposure to covid-19: Secondary | ICD-10-CM | POA: Diagnosis not present

## 2019-07-21 DIAGNOSIS — R0981 Nasal congestion: Secondary | ICD-10-CM

## 2019-07-21 MED ORDER — BENZONATATE 100 MG PO CAPS: 100.0000 mg | ORAL_CAPSULE | Freq: Three times a day (TID) | ORAL | 0 refills | Status: DC

## 2019-07-21 NOTE — ED Provider Notes (Signed)
[facility]    CSN: [id number] Arrival date & time: 07/21/19  0934      History   Chief Complaint Chief Complaint  Patient presents with  . URI    HPI Amber White is a 44 y.o. female w/ h/o morbid obesity  Presenting for Covid testing: Exposure: multiple coworkers have tested positive over the last week Date of exposure: routine/weekly, last suspected contact was last Friday Any fever, symptoms since exposure: mild, dry cough and nasal congestion/pressure x Monday.  Tried dayquil w/o significant relief.  Delsum helped w/ cough.   Past Medical History:  Diagnosis Date  . No pertinent past medical history     Patient Active Problem List   Diagnosis Date Noted  . Pregnancy 02/19/2014  . Morbid obesity ([place]) 02/19/2014  . Dermoid cyst of left ovary 02/19/2014    Past Surgical History:  Procedure Laterality Date  . CHOLECYSTECTOMY      OB History    Gravida  6   Para  4   Term      Preterm      AB  1   Living  4     SAB      TAB  1   Ectopic      Multiple      Live Births               Home Medications    Prior to Admission medications   Medication Sig Start Date End Date Taking? Authorizing Provider  benzonatate (TESSALON) 100 MG capsule Take 1 capsule (100 mg total) by mouth every 8 (eight) hours. 07/21/19   Hall-Potvin, [place], PA-C    Family History Family History  Problem Relation Age of Onset  . Cancer Mother   . Diabetes Maternal Uncle   . Diabetes Maternal Grandmother   . Heart disease Maternal Grandmother     Social History Social History   Tobacco Use  . Smoking status: Never Smoker  . Smokeless tobacco: Never Used  Substance Use Topics  . Alcohol use: Yes    Comment: rare  . Drug use: No     Allergies   Penicillins   Review of Systems Review of Systems  Constitutional: Negative for activity change, appetite change, fatigue and fever.  HENT: Positive for congestion, rhinorrhea and sinus  pressure. Negative for dental problem, ear pain, facial swelling, hearing loss, postnasal drip, sinus pain, sore throat, trouble swallowing and voice change.   Eyes: Negative for photophobia, pain and visual disturbance.  Respiratory: Positive for cough. Negative for apnea, choking, chest tightness, shortness of breath, wheezing and stridor.   Cardiovascular: Negative for chest pain and palpitations.  Gastrointestinal: Negative for abdominal pain, blood in stool, diarrhea, nausea and vomiting.  Musculoskeletal: Negative for arthralgias and myalgias.  Neurological: Negative for dizziness and headaches.     Physical Exam Triage Vital Signs ED Triage Vitals  Enc Vitals Group     BP      Pulse      Resp      Temp      Temp src      SpO2      Weight      Height      Head Circumference      Peak Flow      Pain Score      Pain Loc      Pain Edu?      Excl. in [place]?    No data found.  Updated Vital  Signs BP 134/85 (BP Location: Left Arm)   Pulse 87   Temp 97.6 F (36.4 C) (Temporal)   Resp 16   LMP 06/24/2019   SpO2 100%   Visual Acuity Right Eye Distance:   Left Eye Distance:   Bilateral Distance:    Right Eye Near:   Left Eye Near:    Bilateral Near:     Physical Exam Constitutional:      General: She is not in acute distress.    Appearance: She is obese. She is not ill-appearing.  HENT:     Head: Normocephalic and atraumatic.     Right Ear: Tympanic membrane, ear canal and external ear normal.     Left Ear: Tympanic membrane, ear canal and external ear normal.     Nose: Nose normal.     Comments: Bilateral turbinate edema    Mouth/Throat:     Mouth: Mucous membranes are moist.     Pharynx: Oropharynx is clear. No oropharyngeal exudate or posterior oropharyngeal erythema.  Eyes:     General: No scleral icterus.    Conjunctiva/sclera: Conjunctivae normal.     Pupils: Pupils are equal, round, and reactive to light.  Cardiovascular:     Rate and Rhythm: Normal  rate and regular rhythm.  Pulmonary:     Effort: Pulmonary effort is normal. No respiratory distress.     Breath sounds: No wheezing.  Musculoskeletal:     Cervical back: Neck supple. No tenderness.  Lymphadenopathy:     Cervical: No cervical adenopathy.  Skin:    Capillary Refill: Capillary refill takes less than 2 seconds.     Coloration: Skin is not jaundiced or pale.  Neurological:     Mental Status: She is alert and oriented to person, place, and time.      UC Treatments / Results  Labs (all labs ordered are listed, but only abnormal results are displayed) Labs Reviewed  NOVEL CORONAVIRUS, NAA    EKG   Radiology No results found.  Procedures Procedures (including critical care time)  Medications Ordered in UC Medications - No data to display  Initial Impression / Assessment and Plan / UC Course  I have reviewed the triage vital signs and the nursing notes.  Pertinent labs & imaging results that were available during my care of the patient were reviewed by me and considered in my medical decision making (see chart for details).     Patient afebrile, nontoxic, with SpO2 100%.  Covid PCR pending.  Patient to quarantine until results are back.  We will continue supportive management, sent benzonatate should patient develop worsening cough and like to trial this medication.  Return precautions discussed, patient verbalized understanding and is agreeable to plan. Final Clinical Impressions(s) / UC Diagnoses   Final diagnoses:  Nasal congestion  Exposure to COVID-19 virus     Discharge Instructions     Flonase, allergy pill daily, cough   Your COVID test is pending - it is important to quarantine / isolate at home until your results are back. If you test positive and would like further evaluation for persistent or worsening symptoms, you may schedule an E-visit or virtual (video) visit throughout the [facility] app or website.  PLEASE NOTE: If you  develop severe chest pain or shortness of breath please go to the ER or call 9-1-1 for further evaluation --> DO NOT schedule electronic or virtual visits for this. Please call our office for further guidance / recommendations as needed.  ED Prescriptions    Medication Sig Dispense Auth. Provider   benzonatate (TESSALON) 100 MG capsule Take 1 capsule (100 mg total) by mouth every 8 (eight) hours. 21 capsule Hall-Potvin, [place], PA-C     PDMP not reviewed this encounter.   Hall-Potvin, [place], [place] 07/21/19 1056

## 2019-07-21 NOTE — Discharge Instructions (Addendum)
Flonase, allergy pill daily, cough   Your COVID test is pending - it is important to quarantine / isolate at home until your results are back. If you test positive and would like further evaluation for persistent or worsening symptoms, you may schedule an E-visit or virtual (video) visit throughout the [facility] app or website.  PLEASE NOTE: If you develop severe chest pain or shortness of breath please go to the ER or call 9-1-1 for further evaluation --> DO NOT schedule electronic or virtual visits for this. Please call our office for further guidance / recommendations as needed.

## 2019-07-21 NOTE — ED Triage Notes (Signed)
Pt presents to [facility] for nasal congestion, mild cough since Monday.  Several coworkers have tested positive for [place], and her job wants her to be tested.

## 2019-07-23 LAB — NOVEL CORONAVIRUS, NAA: SARS-CoV-2, NAA: DETECTED — AB

## 2019-07-25 ENCOUNTER — Encounter (HOSPITAL_COMMUNITY): Payer: Self-pay

## 2019-07-25 ENCOUNTER — Telehealth (HOSPITAL_COMMUNITY): Payer: Self-pay | Admitting: Emergency Medicine

## 2019-07-25 NOTE — Telephone Encounter (Signed)
Your test for COVID-19 was positive, meaning that you were infected with the novel coronavirus and could give the germ to others.  Please continue isolation at home for at least 10 days since the start of your symptoms. If you do not have symptoms, please isolate at home for 10 days from the day you were tested. Once you complete your 10 day quarantine, you may return to normal activities as long as you've not had a fever for over 24 hours(without taking fever reducing medicine) and your symptoms are improving. Please continue good preventive care measures, including:  frequent hand-washing, avoid touching your face, cover coughs/sneezes, stay out of crowds and keep a 6 foot distance from others.  Go to the nearest hospital emergency room if fever/cough/breathlessness are severe or illness seems like a threat to life.  Attempted to reach patient. No answer at this time. Phone rang continuously  If you have any questions, you may call me at [phone]

## 2019-09-13 ENCOUNTER — Ambulatory Visit
Admission: EM | Admit: 2019-09-13 | Discharge: 2019-09-13 | Disposition: A | Discharge status: Home / Self Care | Payer: 59 | Attending: Physician Assistant | Admitting: Physician Assistant

## 2019-09-13 DIAGNOSIS — H6983 Other specified disorders of Eustachian tube, bilateral: Secondary | ICD-10-CM

## 2019-09-13 DIAGNOSIS — J3489 Other specified disorders of nose and nasal sinuses: Secondary | ICD-10-CM

## 2019-09-13 DIAGNOSIS — Z8616 Personal history of COVID-19: Secondary | ICD-10-CM | POA: Diagnosis not present

## 2019-09-13 MED ORDER — AZELASTINE HCL 0.1 % NA SOLN: 2.0000 | Freq: Two times a day (BID) | NASAL | 0 refills | Status: DC

## 2019-09-13 MED ORDER — FLUTICASONE PROPIONATE 50 MCG/ACT NA SUSP: 2.0000 | Freq: Every day | NASAL | 0 refills | Status: DC

## 2019-09-13 MED ORDER — PREDNISONE 50 MG PO TABS: 50.0000 mg | ORAL_TABLET | Freq: Every day | ORAL | 0 refills | Status: DC

## 2019-09-13 NOTE — Discharge Instructions (Signed)
Start prednisone as directed. Flonase, azelastine nasal spray as directed for 2 weeks. Can also start allergy medicine such as zyrtec. Keep hydrated, urine should be clear to pale yellow in color. Follow up with PCP if symptoms not improving.

## 2019-09-13 NOTE — ED Provider Notes (Signed)
[facility]    CSN: [id number] Arrival date & time: 09/13/19  [id number]      History   Chief Complaint Chief Complaint  Patient presents with  . Headache    HPI Amber White is a 44 y.o. female.   44 year old female comes in for 1 week history of headaches around both eyes. Feels "nagging" to the frontal sinus and pressure behind the eyes. Denies head injury, loss of consciousness. Denies photophobia, phonophobi.  Denies cough, congestion, sore throat. Denies fever, chills, body aches. Denies abdominal pain, nausea, vomiting, diarrhea. Denies shortness of breath, loss of taste/smell. COVID positive 07/21/2019, symptoms completely resolved prior to current symptom onset. excedrin with temporary relief.      Past Medical History:  Diagnosis Date  . No pertinent past medical history     Patient Active Problem List   Diagnosis Date Noted  . Pregnancy 02/19/2014  . Morbid obesity ([place]) 02/19/2014  . Dermoid cyst of left ovary 02/19/2014    Past Surgical History:  Procedure Laterality Date  . CHOLECYSTECTOMY      OB History    Gravida  6   Para  4   Term      Preterm      AB  1   Living  4     SAB      TAB  1   Ectopic      Multiple      Live Births               Home Medications    Prior to Admission medications   Medication Sig Start Date End Date Taking? Authorizing Provider  azelastine (ASTELIN) 0.1 % nasal spray Place 2 sprays into both nostrils 2 (two) times daily. 09/13/19   Tasia Catchings, Amy V, PA-C  fluticasone (FLONASE) 50 MCG/ACT nasal spray Place 2 sprays into both nostrils daily. 09/13/19   Ok Edwards, PA-C  predniSONE (DELTASONE) 50 MG tablet Take 1 tablet (50 mg total) by mouth daily with breakfast. 09/13/19   Ok Edwards, PA-C    Family History Family History  Problem Relation Age of Onset  . Cancer Mother   . Diabetes Maternal Uncle   . Diabetes Maternal Grandmother   . Heart disease Maternal Grandmother     Social History Social  History   Tobacco Use  . Smoking status: Never Smoker  . Smokeless tobacco: Never Used  Substance Use Topics  . Alcohol use: Yes    Comment: rare  . Drug use: No     Allergies   Penicillins   Review of Systems Review of Systems  Reason unable to perform ROS: See HPI as above.     Physical Exam Triage Vital Signs ED Triage Vitals [09/13/19 0848]  Enc Vitals Group     BP      Pulse Rate 88     Resp      Temp 98.3 F (36.8 C)     Temp Source Oral     SpO2      Weight      Height      Head Circumference      Peak Flow      Pain Score      Pain Loc      Pain Edu?      Excl. in [place]?    No data found.  Updated Vital Signs BP (!) 130/92 (BP Location: Left Arm)   Pulse 88   Temp 98.3 F (  36.8 C) (Oral)   Resp 18   LMP 08/28/2019   SpO2 99%   Breastfeeding No   Visual Acuity Right Eye Distance:   Left Eye Distance:   Bilateral Distance:    Right Eye Near:   Left Eye Near:    Bilateral Near:     Physical Exam Constitutional:      General: She is not in acute distress.    Appearance: Normal appearance. She is not ill-appearing, toxic-appearing or diaphoretic.  HENT:     Head: Normocephalic and atraumatic.     Right Ear: Ear canal and external ear normal. Tympanic membrane is not erythematous or bulging.     Left Ear: Ear canal and external ear normal. Tympanic membrane is not erythematous or bulging.     Ears:     Comments: Bilateral TM opaque, mid ear effusion.     Nose:     Right Sinus: Maxillary sinus tenderness and frontal sinus tenderness present.     Left Sinus: Maxillary sinus tenderness and frontal sinus tenderness present.     Mouth/Throat:     Mouth: Mucous membranes are moist.     Pharynx: Oropharynx is clear. Uvula midline.  Cardiovascular:     Rate and Rhythm: Normal rate and regular rhythm.     Heart sounds: Normal heart sounds. No murmur. No friction rub. No gallop.   Pulmonary:     Effort: Pulmonary effort is normal. No accessory  muscle usage, prolonged expiration, respiratory distress or retractions.     Comments: Lungs clear to auscultation without adventitious lung sounds. Musculoskeletal:     Cervical back: Normal range of motion and neck supple.  Neurological:     General: No focal deficit present.     Mental Status: She is alert and oriented to person, place, and time.      UC Treatments / Results  Labs (all labs ordered are listed, but only abnormal results are displayed) Labs Reviewed - No data to display  EKG   Radiology No results found.  Procedures Procedures (including critical care time)  Medications Ordered in UC Medications - No data to display  Initial Impression / Assessment and Plan / UC Course  I have reviewed the triage vital signs and the nursing notes.  Pertinent labs & imaging results that were available during my care of the patient were reviewed by me and considered in my medical decision making (see chart for details).    Prednisone as directed. Other symptomatic treatment discussed. Push fluids. Return precautions given.  Final Clinical Impressions(s) / UC Diagnoses   Final diagnoses:  Sinus pressure  Dysfunction of both eustachian tubes   ED Prescriptions    Medication Sig Dispense Auth. Provider   predniSONE (DELTASONE) 50 MG tablet Take 1 tablet (50 mg total) by mouth daily with breakfast. 5 tablet Yu, Amy V, PA-C   fluticasone (FLONASE) 50 MCG/ACT nasal spray Place 2 sprays into both nostrils daily. 1 g Yu, Amy V, PA-C   azelastine (ASTELIN) 0.1 % nasal spray Place 2 sprays into both nostrils 2 (two) times daily. 30 mL Ok Edwards, PA-C     PDMP not reviewed this encounter.   Ok Edwards, PA-C 09/13/19 [phone]

## 2019-09-13 NOTE — ED Triage Notes (Signed)
Pt c/o headaches around both eyes x1wk. Denies any other sx's. States had covid last month with just cold sx's

## 2020-01-20 ENCOUNTER — Ambulatory Visit: Payer: Self-pay

## 2020-08-07 ENCOUNTER — Other Ambulatory Visit: Payer: Self-pay | Admitting: Internal Medicine

## 2020-08-07 DIAGNOSIS — Z0000 Encounter for general adult medical examination without abnormal findings: Secondary | ICD-10-CM

## 2020-09-20 ENCOUNTER — Other Ambulatory Visit: Payer: Self-pay

## 2020-09-20 ENCOUNTER — Other Ambulatory Visit: Payer: Self-pay | Admitting: Internal Medicine

## 2020-09-20 ENCOUNTER — Ambulatory Visit
Admission: RE | Admit: 2020-09-20 | Discharge: 2020-09-20 | Disposition: A | Discharge status: Home / Self Care | Payer: No Typology Code available for payment source | Source: Ambulatory Visit | Attending: Internal Medicine | Admitting: Internal Medicine

## 2020-09-20 DIAGNOSIS — Z0000 Encounter for general adult medical examination without abnormal findings: Secondary | ICD-10-CM

## 2020-09-20 DIAGNOSIS — N6001 Solitary cyst of right breast: Secondary | ICD-10-CM

## 2020-10-25 ENCOUNTER — Other Ambulatory Visit: Payer: Self-pay | Admitting: Internal Medicine

## 2020-10-25 ENCOUNTER — Ambulatory Visit
Admission: RE | Admit: 2020-10-25 | Discharge: 2020-10-25 | Disposition: A | Discharge status: Home / Self Care | Payer: No Typology Code available for payment source | Source: Ambulatory Visit | Attending: Internal Medicine | Admitting: Internal Medicine

## 2020-10-25 ENCOUNTER — Other Ambulatory Visit: Payer: Self-pay

## 2020-10-25 DIAGNOSIS — N6001 Solitary cyst of right breast: Secondary | ICD-10-CM

## 2020-10-25 DIAGNOSIS — R928 Other abnormal and inconclusive findings on diagnostic imaging of breast: Secondary | ICD-10-CM

## 2020-10-31 ENCOUNTER — Ambulatory Visit
Admission: RE | Admit: 2020-10-31 | Discharge: 2020-10-31 | Disposition: A | Discharge status: Home / Self Care | Payer: No Typology Code available for payment source | Source: Ambulatory Visit | Attending: Internal Medicine | Admitting: Internal Medicine

## 2020-10-31 ENCOUNTER — Other Ambulatory Visit: Payer: Self-pay

## 2020-10-31 DIAGNOSIS — N6001 Solitary cyst of right breast: Secondary | ICD-10-CM

## 2020-11-01 ENCOUNTER — Encounter: Payer: Self-pay | Admitting: *Deleted

## 2020-11-02 ENCOUNTER — Other Ambulatory Visit: Payer: Self-pay | Admitting: Internal Medicine

## 2020-11-02 ENCOUNTER — Telehealth: Payer: Self-pay | Admitting: Hematology and Oncology

## 2020-11-02 DIAGNOSIS — C50911 Malignant neoplasm of unspecified site of right female breast: Secondary | ICD-10-CM

## 2020-11-02 NOTE — Telephone Encounter (Signed)
Spoke to patient to confirm afternoon [facility] appointment for 4/27, packet emailed to patient

## 2020-11-05 ENCOUNTER — Other Ambulatory Visit: Payer: No Typology Code available for payment source

## 2020-11-06 ENCOUNTER — Other Ambulatory Visit: Payer: Self-pay | Admitting: *Deleted

## 2020-11-06 DIAGNOSIS — C50411 Malignant neoplasm of upper-outer quadrant of right female breast: Secondary | ICD-10-CM

## 2020-11-06 DIAGNOSIS — Z171 Estrogen receptor negative status [ER-]: Secondary | ICD-10-CM | POA: Insufficient documentation

## 2020-11-06 NOTE — Progress Notes (Signed)
[place] CONSULT NOTE  Patient Care Team: Patient, No Pcp Per (Inactive) as PCP - General (General Practice) Rockwell Germany, RN as Oncology Nurse Navigator Mauro Kaufmann, RN as Oncology Nurse Navigator Donnie Mesa, MD as Consulting Physician (General Surgery) Nicholas Lose, MD as Consulting Physician (Hematology and Oncology) Gery Pray, MD as Consulting Physician (Radiation Oncology)  CHIEF COMPLAINTS/PURPOSE OF CONSULTATION:  Newly diagnosed breast cancer  HISTORY OF PRESENTING ILLNESS:  Amber White 45 y.o. female is here because of recent diagnosis of invasive ductal carcinoma of the right breast. Patient palpated a right breast mass for 1 month. Diagnostic mammogram and [place] on 10/25/20 showed an abnormality and calcifications in the right breast spanning 8.2cm with associated skin thickening, right axillary adenopathy, and no left breast malignancy. 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 She presents to the clinic today for initial evaluation and discussion of treatment options.   I reviewed her records extensively and collaborated the history with the patient.  SUMMARY OF ONCOLOGIC HISTORY: Oncology History  Malignant neoplasm of upper-outer quadrant of right breast in female, estrogen receptor negative ([place])  10/31/2020 Initial Diagnosis   Patient palpated a right breast mass x9mo Diagnostic mammogram and [place] abnormality and calcifications in the right breast, 8.2cm, with skin thickening and right axillary adenopathy. 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   11/07/2020 Cancer Staging   Staging form: Breast, AJCC 8th Edition - Clinical: Stage IIIC (cT4, cN1, cM0, G3, ER-, PR-, HER2: Equivocal) - Signed by GNicholas Lose MD on  11/07/2020 Histologic grading system: 3 grade system     MEDICAL HISTORY:  Past Medical History:  Diagnosis Date  . Family history of breast cancer   . No pertinent past medical history     SURGICAL HISTORY: Past Surgical History:  Procedure Laterality Date  . CHOLECYSTECTOMY      SOCIAL HISTORY: Social History   Socioeconomic History  . Marital status: Single    Spouse name: Not on file  . Number of children: Not on file  . Years of education: Not on file  . Highest education level: Not on file  Occupational History  . Not on file  Tobacco Use  . Smoking status: Never Smoker  . Smokeless tobacco: Never Used  Vaping Use  . Vaping Use: Never used  Substance and Sexual Activity  . Alcohol use: Yes    Comment: rare  . Drug use: No  . Sexual activity: Yes    Birth control/protection: None  Other Topics Concern  . Not on file  Social History Narrative  . Not on file   Social Determinants of Health   Financial Resource Strain: Not on file  Food Insecurity: Not on file  Transportation Needs: Not on file  Physical Activity: Not on file  Stress: Not on file  Social Connections: Not on file  Intimate Partner Violence: Not on file    FAMILY HISTORY: Family History  Problem Relation Age of Onset  . Breast cancer Mother   . Diabetes Maternal Uncle   . Diabetes Maternal Grandmother   . Heart disease Maternal Grandmother     ALLERGIES:  is allergic to penicillins.  MEDICATIONS:  No current outpatient medications on file.   No current facility-administered medications for this visit.  REVIEW OF SYSTEMS:     Breast: Palpable right breast mass All other systems were reviewed with the patient and are negative.  PHYSICAL EXAMINATION: ECOG PERFORMANCE STATUS: 1 - Symptomatic but completely ambulatory  Vitals:   11/07/20 1255  BP: (!) 133/94  Pulse: (!) 103  Resp: 18  Temp: 97.8 F (36.6 C)  SpO2: 100%   Filed Weights   11/07/20 1255  Weight: 281  lb 12.8 oz (127.8 kg)    GENERAL:alert, no distress and comfortable SKIN: skin color, texture, turgor are normal, no rashes or significant lesions EYES: normal, conjunctiva are pink and non-injected, sclera clear OROPHARYNX:no exudate, no erythema and lips, buccal mucosa, and tongue normal  NECK: supple, thyroid normal size, non-tender, without nodularity LYMPH:  no palpable lymphadenopathy in the cervical, axillary or inguinal LUNGS: clear to auscultation and percussion with normal breathing effort HEART: regular rate & rhythm and no murmurs and no lower extremity edema ABDOMEN:abdomen soft, non-tender and normal bowel sounds Musculoskeletal:no cyanosis of digits and no clubbing  PSYCH: alert & oriented x 3 with fluent speech NEURO: no focal motor/sensory deficits BREAST: Large palpable mass in the right breast with palpable axillary lymphadenopathy and cutaneous nodules suggestive of skin infiltration by breast cancer no ulceration (exam performed in the presence of a chaperone)   LABORATORY DATA:  I have reviewed the data as listed Lab Results  Component Value Date   WBC 5.9 11/07/2020   HGB 11.1 (L) 11/07/2020   HCT 33.8 (L) 11/07/2020   MCV 90.9 11/07/2020   PLT 331 11/07/2020   Lab Results  Component Value Date   NA 141 11/07/2020   K 3.9 11/07/2020   CL 107 11/07/2020   CO2 24 11/07/2020    RADIOGRAPHIC STUDIES: I have personally reviewed the radiological reports and agreed with the findings in the report.  ASSESSMENT AND PLAN:  Malignant neoplasm of upper-outer quadrant of right breast in female, estrogen receptor negative ([place]) 10/31/20: Patient palpated a right breast mass x25mo Diagnostic mammogram and [place] an abnormality and calcifications in the right breast, 8.2cm, with skin thickening and right axillary adenopathy. 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  Pathology and radiology counseling: Discussed with the patient, the details of pathology including the type of breast cancer,the clinical staging, the significance of ER, PR and HER-2/neu receptors and the implications for treatment. After reviewing the pathology in detail, we proceeded to discuss the different treatment options between surgery, radiation, chemotherapy, antiestrogen therapies.  Recommendation based on multidisciplinary tumor board: 1. Neoadjuvant chemotherapy with Adriamycin and Cytoxan Keytruda 4 followed by Taxol weekly 12 with carboplatin and Keytruda every 3 weeks x4 followed by 1 year of torted Keytruda maintenance 2. Followed by mastectomy and axillary lymph node dissection  3. Followed by adjuvant radiation therapy  Chemotherapy Counseling: I discussed the risks and benefits of chemotherapy including the risks of nausea/ vomiting, risk of infection from low WBC count, fatigue due to chemo or anemia, bruising or bleeding due to low platelets, mouth sores, loss/ change in taste and decreased appetite. Liver and kidney function will be monitored through out chemotherapy as abnormalities in liver and kidney function may be a side effect of treatment.  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 Risk of permanent bone marrow dysfunction due to chemo were also discussed.  We also discussed the risks and benefits  of immunotherapy.  Plan: 1. Port placement  2. Echocardiogram 3. Chemotherapy class 4. Breast MRI 5. CT chest abdomen pelvis and bone scan for staging Genetic counseling will also be arranged  URCC 16070: Treatment of refractory nausea.  After first cycle of chemo if patient experience chemo induced nausea and vomiting the randomized from cycle 2 to Aloxi plus Dex plus olanzapine or placebo plus Compazine or placebo plus placebo prior to chemo and take home medications for day 2 today for of Dex plus  olanzapine or placebo and Compazine or placebo every 8 hours.  If patient does not have nausea after cycle 1, then the trial is complete.  Return to clinic  to start chemotherapy.    All questions were answered. The patient knows to call the clinic with any problems, questions or concerns.   Rulon Eisenmenger, MD, MPH 11/07/2020    I, Molly Dorshimer, am acting as scribe for Nicholas Lose, MD.  I have reviewed the above documentation for accuracy and completeness, and I agree with the above.

## 2020-11-07 ENCOUNTER — Ambulatory Visit: Payer: No Typology Code available for payment source | Admitting: Licensed Clinical Social Worker

## 2020-11-07 ENCOUNTER — Ambulatory Visit: Payer: Self-pay | Admitting: Surgery

## 2020-11-07 ENCOUNTER — Encounter: Payer: Self-pay | Admitting: *Deleted

## 2020-11-07 ENCOUNTER — Other Ambulatory Visit: Payer: Self-pay | Admitting: Pharmacist

## 2020-11-07 ENCOUNTER — Ambulatory Visit
Admission: RE | Admit: 2020-11-07 | Discharge: 2020-11-07 | Disposition: A | Discharge status: Home / Self Care | Payer: No Typology Code available for payment source | Source: Ambulatory Visit | Attending: Radiation Oncology | Admitting: Radiation Oncology

## 2020-11-07 ENCOUNTER — Inpatient Hospital Stay (HOSPITAL_BASED_OUTPATIENT_CLINIC_OR_DEPARTMENT_OTHER): Payer: No Typology Code available for payment source | Admitting: Hematology and Oncology

## 2020-11-07 ENCOUNTER — Encounter: Payer: Self-pay | Admitting: Physical Therapy

## 2020-11-07 ENCOUNTER — Inpatient Hospital Stay: Payer: No Typology Code available for payment source

## 2020-11-07 ENCOUNTER — Other Ambulatory Visit: Payer: Self-pay | Admitting: *Deleted

## 2020-11-07 ENCOUNTER — Other Ambulatory Visit: Payer: Self-pay

## 2020-11-07 ENCOUNTER — Encounter: Payer: Self-pay | Admitting: Licensed Clinical Social Worker

## 2020-11-07 ENCOUNTER — Ambulatory Visit: Payer: No Typology Code available for payment source | Attending: Surgery | Admitting: Physical Therapy

## 2020-11-07 VITALS — BP 133/94 | HR 103 | Temp 97.8°F | Resp 18 | Ht 64.0 in | Wt 281.8 lb

## 2020-11-07 DIAGNOSIS — C50411 Malignant neoplasm of upper-outer quadrant of right female breast: Secondary | ICD-10-CM

## 2020-11-07 DIAGNOSIS — Z171 Estrogen receptor negative status [ER-]: Secondary | ICD-10-CM

## 2020-11-07 DIAGNOSIS — Z803 Family history of malignant neoplasm of breast: Secondary | ICD-10-CM | POA: Insufficient documentation

## 2020-11-07 DIAGNOSIS — R293 Abnormal posture: Secondary | ICD-10-CM | POA: Insufficient documentation

## 2020-11-07 DIAGNOSIS — C773 Secondary and unspecified malignant neoplasm of axilla and upper limb lymph nodes: Secondary | ICD-10-CM

## 2020-11-07 LAB — CMP (CANCER CENTER ONLY)
ALT: 14 U/L (ref 0–44)
AST: 17 U/L (ref 15–41)
Albumin: 3.5 g/dL (ref 3.5–5.0)
Alkaline Phosphatase: 55 U/L (ref 38–126)
Anion gap: 10 (ref 5–15)
BUN: 9 mg/dL (ref 6–20)
CO2: 24 mmol/L (ref 22–32)
Calcium: 8.9 mg/dL (ref 8.9–10.3)
Chloride: 107 mmol/L (ref 98–111)
Creatinine: 0.73 mg/dL (ref 0.44–1.00)
GFR, Estimated: >60 mL/min (ref >60)
Glucose, Bld: 82 mg/dL (ref 70–99)
Potassium: 3.9 mmol/L (ref 3.5–5.1)
Sodium: 141 mmol/L (ref 135–145)
Total Bilirubin: 0.3 mg/dL (ref 0.3–1.2)
Total Protein: 7.7 g/dL (ref 6.5–8.1)

## 2020-11-07 LAB — CBC WITH DIFFERENTIAL (CANCER CENTER ONLY)
Abs Immature Granulocytes: 0.01 10*3/uL (ref 0.00–0.07)
Basophils Absolute: 0 10*3/uL (ref 0.0–0.1)
Basophils Relative: 0 %
Eosinophils Absolute: 0.1 10*3/uL (ref 0.0–0.5)
Eosinophils Relative: 2 %
HCT: 33.8 % — ABNORMAL LOW (ref 36.0–46.0)
Hemoglobin: 11.1 g/dL — ABNORMAL LOW (ref 12.0–15.0)
Immature Granulocytes: 0 %
Lymphocytes Relative: 39 %
Lymphs Abs: 2.3 10*3/uL (ref 0.7–4.0)
MCH: 29.8 pg (ref 26.0–34.0)
MCHC: 32.8 g/dL (ref 30.0–36.0)
MCV: 90.9 fL (ref 80.0–100.0)
Monocytes Absolute: 0.5 10*3/uL (ref 0.1–1.0)
Monocytes Relative: 8 %
Neutro Abs: 3 10*3/uL (ref 1.7–7.7)
Neutrophils Relative %: 51 %
Platelet Count: 331 10*3/uL (ref 150–400)
RBC: 3.72 MIL/uL — ABNORMAL LOW (ref 3.87–5.11)
RDW: 13.7 % (ref 11.5–15.5)
WBC Count: 5.9 10*3/uL (ref 4.0–10.5)
nRBC: 0 % (ref 0.0–0.2)

## 2020-11-07 LAB — GENETIC SCREENING ORDER

## 2020-11-07 MED ORDER — PROCHLORPERAZINE MALEATE 10 MG PO TABS: 10.0000 mg | ORAL_TABLET | Freq: Four times a day (QID) | ORAL | 1 refills | Status: DC | PRN

## 2020-11-07 MED ORDER — LIDOCAINE-PRILOCAINE 2.5-2.5 % EX CREA: TOPICAL_CREAM | CUTANEOUS | 3 refills | Status: DC

## 2020-11-07 MED ORDER — DEXAMETHASONE 4 MG PO TABS: ORAL_TABLET | ORAL | 0 refills | Status: DC

## 2020-11-07 MED ORDER — ONDANSETRON HCL 8 MG PO TABS: 8.0000 mg | ORAL_TABLET | Freq: Two times a day (BID) | ORAL | 1 refills | Status: DC | PRN

## 2020-11-07 NOTE — Assessment & Plan Note (Addendum)
10/31/20: Patient palpated a right breast mass x36mo Diagnostic mammogram and [place] an abnormality and calcifications in the right breast, 8.2cm, with skin thickening and right axillary adenopathy. 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  Pathology and radiology counseling: Discussed with the patient, the details of pathology including the type of breast cancer,the clinical staging, the significance of ER, PR and HER-2/neu receptors and the implications for treatment. After reviewing the pathology in detail, we proceeded to discuss the different treatment options between surgery, radiation, chemotherapy, antiestrogen therapies.  Recommendation based on multidisciplinary tumor board: 1. Neoadjuvant chemotherapy with Adriamycin and Cytoxan Keytruda 4 followed by Taxol weekly 12 with carboplatin and Keytruda every 3 weeks x4 followed by 1 year of torted Keytruda maintenance 2. Followed by mastectomy and axillary lymph node dissection  3. Followed by adjuvant radiation therapy  Chemotherapy Counseling: I discussed the risks and benefits of chemotherapy including the risks of nausea/ vomiting, risk of infection from low WBC count, fatigue due to chemo or anemia, bruising or bleeding due to low platelets, mouth sores, loss/ change in taste and decreased appetite. Liver and kidney function will be monitored through out chemotherapy as abnormalities in liver and kidney function may be a side effect of treatment.  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 Risk of permanent bone marrow dysfunction due to chemo were also discussed.  We also discussed the risks and benefits of immunotherapy.  Plan: 1. Port placement  2. Echocardiogram 3. Chemotherapy class 4. Breast MRI 5. CT chest abdomen pelvis and bone scan for staging Genetic counseling will also  be arranged  URCC 16070: Treatment of refractory nausea.  After first cycle of chemo if patient experience chemo induced nausea and vomiting the randomized from cycle 2 to Aloxi plus Dex plus olanzapine or placebo plus Compazine or placebo plus placebo prior to chemo and take home medications for day 2 today for of Dex plus olanzapine or placebo and Compazine or placebo every 8 hours.  If patient does not have nausea after cycle 1, then the trial is complete.  Return to clinic  to start chemotherapy.

## 2020-11-07 NOTE — H&P (Signed)
History of Present Illness Amber White. Amber Sommerfield MD; 11/07/2020 3:35 PM) The patient is a 45 year old female who presents with breast cancer. Breast MDC 11/07/20 [name]  PCP - Dr. London Pepper ?  This is a 45 year old female with a long history of axillary hidradenitis who presents with 2 months of enlarging firmness in the right upper outer quadrant of her breast with some associated skin thickening. She initially thought that this was part of her hidradenitis. She finally sought attention for the breast mass. Imaging showed an 8.2 cm mass in the right upper outer quadrant with overlying skin thickening. She had bulky right axillary lymphadenopathy. 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 One of the lymph nodes was also biopsied and revealed metastatic carcinoma.   Her mother had breast cancer about 20 years ago. She has a younger sister who is also here today.   Past Surgical History Conni Slipper, RN; 11/07/2020 8:04 AM) Gallbladder Surgery - Laparoscopic  Diagnostic Studies History Conni Slipper, RN; 11/07/2020 8:04 AM) Colonoscopy never  Allergies Rodman Key K. Caileb Rhue, MD; 11/07/2020 3:35 PM) Penicillins Hives.  Medication History Amber White. Tinamarie Przybylski, MD; 11/07/2020 3:35 PM) Medications Reconciled Azelastine HCl (0.1% Solution, Nasal) Active. Flonase Allergy Relief (50MCG/ACT Suspension, Nasal) Active.  Social History Conni Slipper, RN; 11/07/2020 8:04 AM) Alcohol use Occasional alcohol use. Caffeine use Coffee. No drug use Tobacco use Never smoker.  Family History Conni Slipper, RN; 11/07/2020 8:04 AM) Breast Cancer Mother. Diabetes Mellitus Family Members In General. Hypertension Mother.  Pregnancy / Birth History Conni Slipper, RN; 11/07/2020 8:04 AM) Age at menarche 5 years. Gravida 5 Maternal age 96-20 Para 4 Regular periods  Other Problems Conni Slipper, RN;  11/07/2020 8:04 AM) Cholelithiasis     Review of Systems Conni Slipper RN; 11/07/2020 8:04 AM) General Not Present- Appetite Loss, Chills, Fatigue, Fever, Night Sweats, Weight Gain and Weight Loss. Skin Not Present- Change in Wart/Mole, Dryness, Hives, Jaundice, New Lesions, Non-Healing Wounds, Rash and Ulcer. HEENT Not Present- Earache, Hearing Loss, Hoarseness, Nose Bleed, Oral Ulcers, Ringing in the Ears, Seasonal Allergies, Sinus Pain, Sore Throat, Visual Disturbances, Wears glasses/contact lenses and Yellow Eyes. Respiratory Not Present- Bloody sputum, Chronic Cough, Difficulty Breathing, Snoring and Wheezing. Gastrointestinal Not Present- Abdominal Pain, Bloating, Bloody Stool, Change in Bowel Habits, Chronic diarrhea, Constipation, Difficulty Swallowing, Excessive gas, Gets full quickly at meals, Hemorrhoids, Indigestion, Nausea, Rectal Pain and Vomiting. Female Genitourinary Not Present- Frequency, Nocturia, Painful Urination, Pelvic Pain and Urgency. Musculoskeletal Not Present- Back Pain, Joint Pain, Joint Stiffness, Muscle Pain, Muscle Weakness and Swelling of Extremities. Neurological Not Present- Decreased Memory, Fainting, Headaches, Numbness, Seizures, Tingling, Tremor, Trouble walking and Weakness. Psychiatric Not Present- Anxiety, Bipolar, Change in Sleep Pattern, Depression, Fearful and Frequent crying. Endocrine Not Present- Cold Intolerance, Excessive Hunger, Hair Changes, Heat Intolerance, Hot flashes and New Diabetes.   Physical Exam Rodman Key K. Briceida Rasberry MD; 11/07/2020 3:37 PM)  The physical exam findings are as follows: Note:Constitutional: WDWN in NAD, conversant, no obvious deformities; resting comfortably Eyes: Pupils equal, round; sclera anicteric; moist conjunctiva; no lid lag HENT: Oral mucosa moist; good dentition Neck: No masses palpated, trachea midline; no thyromegaly Lungs: CTA bilaterally; normal respiratory effort Breasts: left breast shows no palpable  masses, no nipple changes or discharge; no axillary lymphadenopathy; axillary hidradenitis Right breast shows a large area of firmness with cutaneous nodularity in the upper outer quadrant; palpable lymph nodes, no nipple changes  or discharge CV: Regular rate and rhythm; no murmurs; extremities well-perfused with no edema Abd: +bowel sounds, obese, soft, non-tender, no palpable organomegaly; no palpable hernias Musc: Normal gait; no apparent clubbing or cyanosis in extremities Lymphatic: No palpable cervical or axillary lymphadenopathy Skin: Warm, dry; no sign of jaundice Psychiatric - alert and oriented x 4; calm mood and affect    Assessment & Plan Rodman Key K. 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 The surgical procedure has been discussed with the patient. Potential risks, benefits, alternative treatments, and expected outcomes have been explained. All of the patient's questions at this time have been answered. The likelihood of reaching the patient's treatment goal is good. The patient understand the proposed surgical procedure and wishes to proceed. Note:The patient has been referred for Genetics and MRI to determine extent of disease. I spent approximately 1 hour in discussion with the patient and her family; with other providers, and reviewing all of her studies.   Our plan is to begin neoadjuvant chemotherapy. Even if she has a good response to chemo, she will still likely need mastectomy and ALND considering the extent of the clinical axillary disease and the size of the original tumor with cutaneous involvement. We will place the port as soon as possible and then reevaluate her towards the end of her course of treatment.  Amber White. Georgette Dover, MD, [facility]  General/ Trauma Surgery   11/07/2020 3:41 PM

## 2020-11-07 NOTE — Progress Notes (Signed)
START ON PATHWAY REGIMEN - Breast     Cycles 1 through 4: A cycle is every 21 days:     Pembrolizumab      Paclitaxel      Carboplatin      Filgrastim-xxxx    Cycles 5 through 8: A cycle is every 21 days:     Pembrolizumab      Doxorubicin      Cyclophosphamide      Pegfilgrastim-xxxx   **Always confirm dose/schedule in your pharmacy ordering system**  Patient Characteristics: Preoperative or Nonsurgical Candidate (Clinical Staging), Neoadjuvant Therapy followed by Surgery, Invasive Disease, Chemotherapy, HER2 Negative/Unknown/Equivocal, ER Negative/Unknown, Platinum Therapy Indicated, High-Risk Disease Present Therapeutic Status: Preoperative or Nonsurgical Candidate (Clinical Staging) AJCC M Category: cM0 AJCC Grade: G3 Breast Surgical Plan: Neoadjuvant Therapy followed by Surgery ER Status: Negative (-) AJCC 8 Stage Grouping: IIIC HER2 Status: Negative (-) AJCC T Category: cT4 AJCC N Category: cN1 PR Status: Negative (-) Type of Therapy: Platinum Therapy Indicated Intent of Therapy: Curative Intent, Discussed with Patient

## 2020-11-07 NOTE — Therapy (Signed)
[place], [place], [address] Phone: [phone]   Fax:  [phone]  Physical Therapy Evaluation  Patient Details  Name: Amber White MRN: [id number] Date of Birth: November 05, 1975 Referring Provider (PT): Dr. Donnie Mesa   Encounter Date: 11/07/2020   PT End of Session - 11/07/20 1724    Visit Number 1    Number of Visits 2    Date for PT Re-Evaluation 05/09/21    PT Start Time 1440    PT Stop Time 1505    PT Time Calculation (min) 25 min    Activity Tolerance Patient tolerated treatment well    Behavior During Therapy [facility] for tasks assessed/performed           Past Medical History:  Diagnosis Date  . Family history of breast cancer   . No pertinent past medical history     Past Surgical History:  Procedure Laterality Date  . CHOLECYSTECTOMY      There were no vitals filed for this visit.    Subjective Assessment - 11/07/20 1700    Subjective Patient reports she is here today to be seen by her medical team for her newly diagnosed right breast cancer.    Patient is accompained by: Family member    Pertinent History Patient was diagnosed on 10/25/2020 with right grade III invasive ductal carcinoma breast cancer. It measures 8.2 cm total including calcification and is located in the upper outer quadrant. It is triple negative with a Ki67 of 60%. She has multiple abnormal appearing axillary lymph nodes and 1 biopsied was positive for carcinoma.    Patient Stated Goals Reduce lymphedema risk and learn post op shoulder ROM HEP    Currently in Pain? No/denies              [facility] PT Assessment - 11/07/20 0001      Assessment   Medical Diagnosis right breast cancer    Referring Provider (PT) Dr. Donnie Mesa    Onset Date/Surgical Date 10/25/20    Hand Dominance Right    Prior Therapy none      Precautions   Precautions Other (comment)    Precaution Comments active cancer       Restrictions   Weight Bearing Restrictions No      Balance Screen   Has the patient fallen in the past 6 months No    Has the patient had a decrease in activity level because of a fear of falling?  No    Is the patient reluctant to leave their home because of a fear of falling?  No      Home Environment   Living Environment Private residence    Living Arrangements Children   60 and 42 y.o. daughters   Available Help at Discharge Family      Prior Function   Level of Independence Independent    Vocation Full time employment    Vocation Requirements Works at [employer] in [place]    Leisure She walks 20 minutes each day walking her dog      Cognition   Overall Cognitive Status Within Functional Limits for tasks assessed      Functional Tests   Functional tests Sit to Stand      Sit to Stand   Comments 30 seconds sit to stand = 12 reps      Posture/Postural Control   Posture/Postural Control Postural limitations    Postural Limitations Rounded Shoulders;Forward head  ROM / Strength   AROM / PROM / Strength AROM;Strength      AROM   Overall AROM Comments Cervical AROM is WNL    AROM Assessment Site Shoulder    Right/Left Shoulder Right;Left    Right Shoulder Extension 58 Degrees    Right Shoulder Flexion 152 Degrees    Right Shoulder ABduction 163 Degrees    Right Shoulder Internal Rotation 47 Degrees    Right Shoulder External Rotation 72 Degrees    Left Shoulder Extension 59 Degrees    Left Shoulder Flexion 147 Degrees    Left Shoulder ABduction 166 Degrees    Left Shoulder Internal Rotation 53 Degrees    Left Shoulder External Rotation 88 Degrees      Strength   Overall Strength Within functional limits for tasks performed             LYMPHEDEMA/ONCOLOGY QUESTIONNAIRE - 11/07/20 0001      Type   Cancer Type Right breast cancer      Lymphedema Assessments   Lymphedema Assessments Upper extremities      Right Upper Extremity  Lymphedema   10 cm Proximal to Olecranon Process 42.8 cm    Olecranon Process 32 cm    10 cm Proximal to Ulnar Styloid Process 27.8 cm    Just Proximal to Ulnar Styloid Process 20.3 cm    Across Hand at [employer] 20.8 cm    At [place] of 2nd Digit 7.2 cm      Left Upper Extremity Lymphedema   10 cm Proximal to Olecranon Process 40.8 cm    Olecranon Process 31.2 cm    10 cm Proximal to Ulnar Styloid Process 25.9 cm    Just Proximal to Ulnar Styloid Process 20.3 cm    Across Hand at [employer] 20.2 cm    At [place] of 2nd Digit 6.8 cm           L-DEX FLOWSHEETS - 11/07/20 1700      L-DEX LYMPHEDEMA SCREENING   Measurement Type Unilateral    L-DEX MEASUREMENT EXTREMITY Upper Extremity    POSITION  Standing    DOMINANT SIDE Right    At Risk Side Right    BASELINE SCORE (UNILATERAL) 7.4           The patient was assessed using the L-Dex machine today to produce a lymphedema index baseline score. The patient will be reassessed on a regular basis (typically every 3 months) to obtain new L-Dex scores. If the score is > 6.5 points away from his/her baseline score indicating onset of subclinical lymphedema, it will be recommended to wear a compression garment for 4 weeks, 12 hours per day and then be reassessed. If the score continues to be > 6.5 points from baseline at reassessment, we will initiate lymphedema treatment. Assessing in this manner has a 95% rate of preventing clinically significant lymphedema.      Katina Dung - 11/07/20 0001    Open a tight or new jar No difficulty    Do heavy household chores (wash walls, wash floors) No difficulty    Carry a shopping bag or briefcase No difficulty    Wash your back No difficulty    Use a knife to cut food No difficulty    Recreational activities in which you take some force or impact through your arm, shoulder, or hand (golf, hammering, tennis) No difficulty    During the past week, to what extent has your arm, shoulder or  hand problem interfered with your normal social activities with family, friends, neighbors, or groups? Not at all    During the past week, to what extent has your arm, shoulder or hand problem limited your work or other regular daily activities Not at all    Arm, shoulder, or hand pain. None    Tingling (pins and needles) in your arm, shoulder, or hand None    Difficulty Sleeping No difficulty    DASH Score 0 %            Objective measurements completed on examination: See above findings.        Patient was instructed today in a home exercise program today for post op shoulder range of motion. These included active assist shoulder flexion in sitting, scapular retraction, wall walking with shoulder abduction, and hands behind head external rotation.  She was encouraged to do these twice a day, holding 3 seconds and repeating 5 times when permitted by her physician.           PT Education - 11/07/20 1721    Education Details Lymphedema risk reduction and post op shoulder ROM HEP    Person(s) Educated Patient    Methods Explanation;Demonstration;Handout    Comprehension Returned demonstration;Verbalized understanding               PT Long Term Goals - 11/07/20 1728      PT LONG TERM GOAL #1   Title Patient will demonstrate she has regained full shoulder ROM and function post operatively compared to baselines.    Time 6    Period Months    Status New    Target Date 05/09/21           Breast Clinic Goals - 11/07/20 1728      Patient will be able to verbalize understanding of pertinent lymphedema risk reduction practices relevant to her diagnosis specifically related to skin care.   Time 1    Period Days    Status Achieved      Patient will be able to return demonstrate and/or verbalize understanding of the post-op home exercise program related to regaining shoulder range of motion.   Time 1    Period Days    Status Achieved      Patient will be able to  verbalize understanding of the importance of attending the postoperative After Breast Cancer Class for further lymphedema risk reduction education and therapeutic exercise.   Time 1    Period Days    Status Achieved                 Plan - 11/07/20 1725    Clinical Impression Statement Patient was diagnosed on 10/25/2020 with right grade III invasive ductal carcinoma breast cancer. It measures 8.2 cm total including calcification and is located in the upper outer quadrant. It is triple negative with a Ki67 of 60%. She has multiple abnormal appearing axillary lymph nodes and 1 biopsied was positive for carcinoma. Her multidisciplinary medical team met prior to her assessments to determine a recommended treatment plan. She is planning to have neoadjuvant chemotherapy followed by a lumpectomy or mastectomy and axillary lymph node dissection and radiation. She will benefit from a post op PT reassessment to determine needs and from L-Dex screens every 3 months for 2 years to detect subclinical lymphedema.    Stability/Clinical Decision Making Stable/Uncomplicated    Clinical Decision Making Low    Rehab Potential Excellent    PT Frequency --  Eval and 1 f/u visit   PT Treatment/Interventions ADLs/Self Care Home Management;Therapeutic exercise;Patient/family education    PT Next Visit Plan Will reassess 3-4 weeks post op    PT Home Exercise Plan Post op ROM HEP    Consulted and Agree with Plan of Care Patient;Family member/caregiver    Family Member Consulted Mother and sister           Patient will benefit from skilled therapeutic intervention in order to improve the following deficits and impairments:  Postural dysfunction,Decreased range of motion,Decreased knowledge of precautions,Impaired UE functional use,Pain  Visit Diagnosis: Malignant neoplasm of upper-outer quadrant of right breast in female, estrogen receptor negative ([place]) - Plan: PT plan of care cert/re-cert  Abnormal posture  - Plan: PT plan of care cert/re-cert   Patient will follow up at outpatient cancer rehab 3-4 weeks following surgery.  If the patient requires physical therapy at that time, a specific plan will be dictated and sent to the referring physician for approval. The patient was educated today on appropriate basic range of motion exercises to begin post operatively and the importance of attending the After Breast Cancer class following surgery.  Patient was educated today on lymphedema risk reduction practices as it pertains to recommendations that will benefit the patient immediately following surgery.  She verbalized good understanding.      Problem List Patient Active Problem List   Diagnosis Date Noted  . Family history of breast cancer   . Malignant neoplasm of upper-outer quadrant of right breast in female, estrogen receptor negative ([place]) 11/06/2020  . Pregnancy 02/19/2014  . Morbid obesity ([place]) 02/19/2014  . Dermoid cyst of left ovary 02/19/2014   Annia Friendly, PT 11/07/20 5:32 PM  [place], [place], [address] Phone: [phone]   Fax:  [phone]  Name: Drue Camera MRN: [id number] Date of Birth: March 26, 1976

## 2020-11-07 NOTE — H&P (View-Only) (Signed)
History of Present Illness Amber White. Amber Golonka MD; 11/07/2020 3:35 PM) The patient is a 45 year old female who presents with breast cancer. Breast MDC 11/07/20 Amber White  PCP - Dr. London Pepper ?  This is a 45 year old female with a long history of axillary hidradenitis who presents with 2 months of enlarging firmness in the right upper outer quadrant of her breast with some associated skin thickening. She initially thought that this was part of her hidradenitis. She finally sought attention for the breast mass. Imaging showed an 8.2 cm mass in the right upper outer quadrant with overlying skin thickening. She had bulky right axillary lymphadenopathy. 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 One of the lymph nodes was also biopsied and revealed metastatic carcinoma.   Her mother had breast cancer about 20 years ago. She has a younger sister who is also here today.   Past Surgical History Conni Slipper, RN; 11/07/2020 8:04 AM) Gallbladder Surgery - Laparoscopic  Diagnostic Studies History Conni Slipper, RN; 11/07/2020 8:04 AM) Colonoscopy never  Allergies Rodman Key K. Robert Sperl, MD; 11/07/2020 3:35 PM) Penicillins Hives.  Medication History Amber White. Madaline Lefeber, MD; 11/07/2020 3:35 PM) Medications Reconciled Azelastine HCl (0.1% Solution, Nasal) Active. Flonase Allergy Relief (50MCG/ACT Suspension, Nasal) Active.  Social History Conni Slipper, RN; 11/07/2020 8:04 AM) Alcohol use Occasional alcohol use. Caffeine use Coffee. No drug use Tobacco use Never smoker.  Family History Conni Slipper, RN; 11/07/2020 8:04 AM) Breast Cancer Mother. Diabetes Mellitus Family Members In General. Hypertension Mother.  Pregnancy / Birth History Conni Slipper, RN; 11/07/2020 8:04 AM) Age at menarche 5 years. Gravida 5 Maternal age 96-20 Para 4 Regular periods  Other Problems Conni Slipper, RN;  11/07/2020 8:04 AM) Cholelithiasis     Review of Systems Conni Slipper RN; 11/07/2020 8:04 AM) General Not Present- Appetite Loss, Chills, Fatigue, Fever, Night Sweats, Weight Gain and Weight Loss. Skin Not Present- Change in Wart/Mole, Dryness, Hives, Jaundice, New Lesions, Non-Healing Wounds, Rash and Ulcer. HEENT Not Present- Earache, Hearing Loss, Hoarseness, Nose Bleed, Oral Ulcers, Ringing in the Ears, Seasonal Allergies, Sinus Pain, Sore Throat, Visual Disturbances, Wears glasses/contact lenses and Yellow Eyes. Respiratory Not Present- Bloody sputum, Chronic Cough, Difficulty Breathing, Snoring and Wheezing. Gastrointestinal Not Present- Abdominal Pain, Bloating, Bloody Stool, Change in Bowel Habits, Chronic diarrhea, Constipation, Difficulty Swallowing, Excessive gas, Gets full quickly at meals, Hemorrhoids, Indigestion, Nausea, Rectal Pain and Vomiting. Female Genitourinary Not Present- Frequency, Nocturia, Painful Urination, Pelvic Pain and Urgency. Musculoskeletal Not Present- Back Pain, Joint Pain, Joint Stiffness, Muscle Pain, Muscle Weakness and Swelling of Extremities. Neurological Not Present- Decreased Memory, Fainting, Headaches, Numbness, Seizures, Tingling, Tremor, Trouble walking and Weakness. Psychiatric Not Present- Anxiety, Bipolar, Change in Sleep Pattern, Depression, Fearful and Frequent crying. Endocrine Not Present- Cold Intolerance, Excessive Hunger, Hair Changes, Heat Intolerance, Hot flashes and New Diabetes.   Physical Exam Rodman Key K. Hodari Chuba MD; 11/07/2020 3:37 PM)  The physical exam findings are as follows: Note:Constitutional: WDWN in NAD, conversant, no obvious deformities; resting comfortably Eyes: Pupils equal, round; sclera anicteric; moist conjunctiva; no lid lag HENT: Oral mucosa moist; good dentition Neck: No masses palpated, trachea midline; no thyromegaly Lungs: CTA bilaterally; normal respiratory effort Breasts: left breast shows no palpable  masses, no nipple changes or discharge; no axillary lymphadenopathy; axillary hidradenitis Right breast shows a large area of firmness with cutaneous nodularity in the upper outer quadrant; palpable lymph nodes, no nipple changes  or discharge CV: Regular rate and rhythm; no murmurs; extremities well-perfused with no edema Abd: +bowel sounds, obese, soft, non-tender, no palpable organomegaly; no palpable hernias Musc: Normal gait; no apparent clubbing or cyanosis in extremities Lymphatic: No palpable cervical or axillary lymphadenopathy Skin: Warm, dry; no sign of jaundice Psychiatric - alert and oriented x 4; calm mood and affect    Assessment & Plan Rodman Key K. 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 The surgical procedure has been discussed with the patient. Potential risks, benefits, alternative treatments, and expected outcomes have been explained. All of the patient's questions at this time have been answered. The likelihood of reaching the patient's treatment goal is good. The patient understand the proposed surgical procedure and wishes to proceed. Note:The patient has been referred for Genetics and MRI to determine extent of disease. I spent approximately 1 hour in discussion with the patient and her family; with other providers, and reviewing all of her studies.   Our plan is to begin neoadjuvant chemotherapy. Even if she has a good response to chemo, she will still likely need mastectomy and ALND considering the extent of the clinical axillary disease and the size of the original tumor with cutaneous involvement. We will place the port as soon as possible and then reevaluate her towards the end of her course of treatment.  Amber White. Georgette Dover, MD, [facility]  General/ Trauma Surgery   11/07/2020 3:41 PM

## 2020-11-07 NOTE — Progress Notes (Signed)
Radiation Oncology         [phone] ________________________________  Multidisciplinary Breast Oncology Clinic [facility]) Initial Outpatient Consultation  Name: Amber White MRN: [id number]  Date: 11/07/2020  DOB: 1975/09/21  HY:[id number], No Pcp Per (Inactive)  Donnie Mesa, MD   REFERRING PHYSICIAN: Donnie Mesa, MD  DIAGNOSIS: The encounter diagnosis was Malignant neoplasm of upper-outer quadrant of right breast in female, estrogen receptor negative ([place]).  Stage T4, N1 Right Breast UOQ, Invasive Ductal Carcinoma, ER- / PR- / Her2-, Grade 3    ICD-10-CM   1. Malignant neoplasm of upper-outer quadrant of right breast in female, estrogen receptor negative ([place])  C50.411    Z17.1     HISTORY OF PRESENT ILLNESS::Amber White is a 45 y.o. female who is presenting to the office today for evaluation of her newly diagnosed breast cancer. She is accompanied by her sister and mother. She is doing well overall.   She presented with a palpable right breast lump for approximately 1 month. She underwent bilateral diagnostic mammography with tomography and bilateral breast ultrasonography at [facility] on 10/25/20 showing: at the palpable site of concern in the right breast there is highly suspicious extensive masslike abnormality and associated calcifications overall measuring up to 8.2 cm; there is associated skin thickening and physical exam findings concerning for inflammatory breast cancer; bulky right axillary adenopathy; no evidence of left breast malignancy or left axillary adenopathy.  Biopsy on 10/31/20 showed: invasive ductal carcinoma, grade 3; DCIS, high grade with necrosis. The biopsied lymph node was positive for metastatic carcinoma. Prognostic indicators significant for: estrogen receptor, 0% negative and progesterone receptor, 0% negative. Proliferation marker Ki67 at 60%. HER2 negative by FISH.  The patient was referred today for presentation in the multidisciplinary  conference.  Radiology studies and pathology slides were presented there for review and discussion of treatment options.  A consensus was discussed regarding potential next steps.  PREVIOUS RADIATION THERAPY: No  PAST MEDICAL HISTORY:  Past Medical History:  Diagnosis Date  . Family history of breast cancer   . No pertinent past medical history     PAST SURGICAL HISTORY: Past Surgical History:  Procedure Laterality Date  . CHOLECYSTECTOMY      FAMILY HISTORY:  Family History  Problem Relation Age of Onset  . Breast cancer Mother   . Diabetes Maternal Uncle   . Diabetes Maternal Grandmother   . Heart disease Maternal Grandmother     SOCIAL HISTORY:  Social History   Socioeconomic History  . Marital status: Single    Spouse name: Not on file  . Number of children: Not on file  . Years of education: Not on file  . Highest education level: Not on file  Occupational History  . Not on file  Tobacco Use  . Smoking status: Never Smoker  . Smokeless tobacco: Never Used  Vaping Use  . Vaping Use: Never used  Substance and Sexual Activity  . Alcohol use: Yes    Comment: rare  . Drug use: No  . Sexual activity: Yes    Birth control/protection: None  Other Topics Concern  . Not on file  Social History Narrative  . Not on file   Social Determinants of Health   Financial Resource Strain: Not on file  Food Insecurity: Not on file  Transportation Needs: Not on file  Physical Activity: Not on file  Stress: Not on file  Social Connections: Not on file    ALLERGIES:  Allergies  Allergen Reactions  .  Penicillins Hives    MEDICATIONS:  Current Outpatient Medications  Medication Sig Dispense Refill  . dexamethasone (DECADRON) 4 MG tablet Take 1 tablet day before Adriamycin and Cytoxan and 1 tablet day after with food 10 tablet 0  . lidocaine-prilocaine (EMLA) cream Apply to affected area once 30 g 3  . ondansetron (ZOFRAN) 8 MG tablet Take 1 tablet (8 mg total) by  mouth 2 (two) times daily as needed. Start on the third day after carboplatin and AC chemotherapy. 30 tablet 1  . prochlorperazine (COMPAZINE) 10 MG tablet Take 1 tablet (10 mg total) by mouth every 6 (six) hours as needed (Nausea or vomiting). 30 tablet 1   No current facility-administered medications for this encounter.    REVIEW OF SYSTEMS: A 10+ POINT REVIEW OF SYSTEMS WAS OBTAINED including neurology, dermatology, psychiatry, cardiac, respiratory, lymph, extremities, GI, GU, musculoskeletal, constitutional, reproductive, HEENT. On the provided questionnaire, she reports wearing glasses and palpable breast lump. She denies any other symptoms.   PHYSICAL EXAM: Vitals with BMI 11/07/2020  Height '5\' 4"'   Weight 281 lbs 13 oz  BMI 19.50  Systolic 932  Diastolic 94  Pulse 671  Lungs are clear to auscultation bilaterally. Heart has regular rate and rhythm. No palpable cervical, supraclavicular, or left axillary adenopathy. Abdomen soft, non-tender, normal bowel sounds. Breast: left breast with no palpable mass, nipple discharge, or bleeding; she has a scar in the lower inner quadrant from prior cyst removal and has developed a keloid in this area. Right breast with skin involvement with erythema and nodularity immediately over a large palpable mass which occupies most of the lateral aspect of the breast, estimated to be 8 x 8 cm in size; erythema and possible skin involvement in the lower-inner quadrant; no nipple discharge or bleeding. Patient has several palpable lymph nodes in the right axilla.  KPS = 90  100 - Normal; no complaints; no evidence of disease. 90   - Able to carry on normal activity; minor signs or symptoms of disease. 80   - Normal activity with effort; some signs or symptoms of disease. 8   - Cares for self; unable to carry on normal activity or to do active work. 60   - Requires occasional assistance, but is able to care for most of his personal needs. 50   - Requires  considerable assistance and frequent medical care. 37   - Disabled; requires special care and assistance. 14   - Severely disabled; hospital admission is indicated although death not imminent. 65   - Very sick; hospital admission necessary; active supportive treatment necessary. 10   - Moribund; fatal processes progressing rapidly. 0     - Dead  Karnofsky DA, Abelmann [place], Craver LS and Burchenal [facility] [phone]) The use of the nitrogen mustards in the palliative treatment of carcinoma: with particular reference to bronchogenic carcinoma Cancer 1 634-56  LABORATORY DATA:  Lab Results  Component Value Date   WBC 5.9 11/07/2020   HGB 11.1 (L) 11/07/2020   HCT 33.8 (L) 11/07/2020   MCV 90.9 11/07/2020   PLT 331 11/07/2020   Lab Results  Component Value Date   NA 141 11/07/2020   K 3.9 11/07/2020   CL 107 11/07/2020   CO2 24 11/07/2020   Lab Results  Component Value Date   ALT 14 11/07/2020   AST 17 11/07/2020   ALKPHOS 55 11/07/2020   BILITOT 0.3 11/07/2020    PULMONARY FUNCTION TEST:   Recent Review Flowsheet Data  There is no flowsheet data to display.     RADIOGRAPHY: US BREAST LTD UNI LEFT INC AXILLA  Result Date: 10/25/2020 CLINICAL DATA:  45 year old female presenting with a new lump in the right breast for approximately 1 month. EXAM: DIGITAL DIAGNOSTIC BILATERAL MAMMOGRAM WITH TOMOSYNTHESIS AND CAD; ULTRASOUND LEFT BREAST LIMITED; ULTRASOUND RIGHT BREAST LIMITED TECHNIQUE: Bilateral digital diagnostic mammography and breast tomosynthesis was performed. The images were evaluated with computer-aided detection.; Targeted ultrasound examination of the left breast was performed; Targeted ultrasound examination of the right breast was performed COMPARISON:  None. ACR Breast Density Category c: The breast tissue is heterogeneously dense, which may obscure small masses. FINDINGS: Mammogram: Right breast: A skin BB marks the palpable site of concern reported by the patient in the upper  outer right breast. Spot compression tomosynthesis views of this area performed in addition to standard views. There is a large ill-defined masslike area throughout the upper-outer quadrant of the right breast which may span up to 8.2 cm. There are X so seated extensive suspicious pleomorphic calcifications spanning at least 6 cm. There is overlying skin thickening. There are multiple abnormal bulky right axillary lymph nodes. Left breast: No suspicious mass, distortion, or microcalcifications are identified to suggest the presence of malignancy. On physical exam of the right breast I feel a large mass extending throughout the upper-outer quadrant of the right breast. There is associated skin thickening with red discoloration. In the bilateral axillary regions there are skin lesions consistent with hidradenitis. Ultrasound: Targeted ultrasound performed throughout the upper-outer quadrant of the right breast at the palpable area demonstrates a large ill-defined hypoechoic area measuring at least 6.4 x 2.8 x 4.5 cm though difficult to measure due to ill-defined margins. There is internal vascularity. There is overlying skin thickening. Several skin lesions are identified in the right axilla consistent with hidradenitis. Targeted ultrasound of the right axilla demonstrates numerous enlarged abnormal lymph nodes with cortical thickening measuring up to 1.3 cm. Targeted ultrasound of the left axilla and axillary tail demonstrates multiple normal lymph nodes. There are numerous skin lesions in the left axilla consistent with hidradenitis. IMPRESSION: 1. At the palpable site of concern in the right breast there is highly suspicious extensive masslike abnormality and associated calcifications overall measuring up to 8.2 cm. There is associated skin thickening and physical exam findings concerning for inflammatory breast cancer. 2.  Bulky right axillary adenopathy. 3. No evidence of left breast malignancy or left axillary  adenopathy. 4.  Hidradenitis suppurativa. RECOMMENDATION: 1. Ultrasound-guided core needle biopsy x2 of the right breast targeting the most inferior and superior aspects of the palpable mass in the upper outer right breast. 2. Ultrasound-guided core needle biopsy of a right axillary lymph node. 3.  Breast MRI following biopsy for extent of disease. I have discussed the findings and recommendations with the patient who agrees to proceed with biopsy. The patient will be scheduled for the biopsies prior to leaving our office today. BI-RADS CATEGORY  5: Highly suggestive of malignancy. Electronically Signed   By: Audie Pinto M.D.   On: 10/25/2020 12:55   US BREAST LTD UNI RIGHT INC AXILLA  Result Date: 10/25/2020 CLINICAL DATA:  45 year old female presenting with a new lump in the right breast for approximately 1 month. EXAM: DIGITAL DIAGNOSTIC BILATERAL MAMMOGRAM WITH TOMOSYNTHESIS AND CAD; ULTRASOUND LEFT BREAST LIMITED; ULTRASOUND RIGHT BREAST LIMITED TECHNIQUE: Bilateral digital diagnostic mammography and breast tomosynthesis was performed. The images were evaluated with computer-aided detection.; Targeted ultrasound examination of the left breast was  performed; Targeted ultrasound examination of the right breast was performed COMPARISON:  None. ACR Breast Density Category c: The breast tissue is heterogeneously dense, which may obscure small masses. FINDINGS: Mammogram: Right breast: A skin BB marks the palpable site of concern reported by the patient in the upper outer right breast. Spot compression tomosynthesis views of this area performed in addition to standard views. There is a large ill-defined masslike area throughout the upper-outer quadrant of the right breast which may span up to 8.2 cm. There are X so seated extensive suspicious pleomorphic calcifications spanning at least 6 cm. There is overlying skin thickening. There are multiple abnormal bulky right axillary lymph nodes. Left breast: No  suspicious mass, distortion, or microcalcifications are identified to suggest the presence of malignancy. On physical exam of the right breast I feel a large mass extending throughout the upper-outer quadrant of the right breast. There is associated skin thickening with red discoloration. In the bilateral axillary regions there are skin lesions consistent with hidradenitis. Ultrasound: Targeted ultrasound performed throughout the upper-outer quadrant of the right breast at the palpable area demonstrates a large ill-defined hypoechoic area measuring at least 6.4 x 2.8 x 4.5 cm though difficult to measure due to ill-defined margins. There is internal vascularity. There is overlying skin thickening. Several skin lesions are identified in the right axilla consistent with hidradenitis. Targeted ultrasound of the right axilla demonstrates numerous enlarged abnormal lymph nodes with cortical thickening measuring up to 1.3 cm. Targeted ultrasound of the left axilla and axillary tail demonstrates multiple normal lymph nodes. There are numerous skin lesions in the left axilla consistent with hidradenitis. IMPRESSION: 1. At the palpable site of concern in the right breast there is highly suspicious extensive masslike abnormality and associated calcifications overall measuring up to 8.2 cm. There is associated skin thickening and physical exam findings concerning for inflammatory breast cancer. 2.  Bulky right axillary adenopathy. 3. No evidence of left breast malignancy or left axillary adenopathy. 4.  Hidradenitis suppurativa. RECOMMENDATION: 1. Ultrasound-guided core needle biopsy x2 of the right breast targeting the most inferior and superior aspects of the palpable mass in the upper outer right breast. 2. Ultrasound-guided core needle biopsy of a right axillary lymph node. 3.  Breast MRI following biopsy for extent of disease. I have discussed the findings and recommendations with the patient who agrees to proceed with  biopsy. The patient will be scheduled for the biopsies prior to leaving our office today. BI-RADS CATEGORY  5: Highly suggestive of malignancy. Electronically Signed   By: Audie Pinto M.D.   On: 10/25/2020 12:55   MM DIAG BREAST TOMO BILATERAL  Result Date: 10/25/2020 CLINICAL DATA:  45 year old female presenting with a new lump in the right breast for approximately 1 month. EXAM: DIGITAL DIAGNOSTIC BILATERAL MAMMOGRAM WITH TOMOSYNTHESIS AND CAD; ULTRASOUND LEFT BREAST LIMITED; ULTRASOUND RIGHT BREAST LIMITED TECHNIQUE: Bilateral digital diagnostic mammography and breast tomosynthesis was performed. The images were evaluated with computer-aided detection.; Targeted ultrasound examination of the left breast was performed; Targeted ultrasound examination of the right breast was performed COMPARISON:  None. ACR Breast Density Category c: The breast tissue is heterogeneously dense, which may obscure small masses. FINDINGS: Mammogram: Right breast: A skin BB marks the palpable site of concern reported by the patient in the upper outer right breast. Spot compression tomosynthesis views of this area performed in addition to standard views. There is a large ill-defined masslike area throughout the upper-outer quadrant of the right breast which may span up to  8.2 cm. There are X so seated extensive suspicious pleomorphic calcifications spanning at least 6 cm. There is overlying skin thickening. There are multiple abnormal bulky right axillary lymph nodes. Left breast: No suspicious mass, distortion, or microcalcifications are identified to suggest the presence of malignancy. On physical exam of the right breast I feel a large mass extending throughout the upper-outer quadrant of the right breast. There is associated skin thickening with red discoloration. In the bilateral axillary regions there are skin lesions consistent with hidradenitis. Ultrasound: Targeted ultrasound performed throughout the upper-outer  quadrant of the right breast at the palpable area demonstrates a large ill-defined hypoechoic area measuring at least 6.4 x 2.8 x 4.5 cm though difficult to measure due to ill-defined margins. There is internal vascularity. There is overlying skin thickening. Several skin lesions are identified in the right axilla consistent with hidradenitis. Targeted ultrasound of the right axilla demonstrates numerous enlarged abnormal lymph nodes with cortical thickening measuring up to 1.3 cm. Targeted ultrasound of the left axilla and axillary tail demonstrates multiple normal lymph nodes. There are numerous skin lesions in the left axilla consistent with hidradenitis. IMPRESSION: 1. At the palpable site of concern in the right breast there is highly suspicious extensive masslike abnormality and associated calcifications overall measuring up to 8.2 cm. There is associated skin thickening and physical exam findings concerning for inflammatory breast cancer. 2.  Bulky right axillary adenopathy. 3. No evidence of left breast malignancy or left axillary adenopathy. 4.  Hidradenitis suppurativa. RECOMMENDATION: 1. Ultrasound-guided core needle biopsy x2 of the right breast targeting the most inferior and superior aspects of the palpable mass in the upper outer right breast. 2. Ultrasound-guided core needle biopsy of a right axillary lymph node. 3.  Breast MRI following biopsy for extent of disease. I have discussed the findings and recommendations with the patient who agrees to proceed with biopsy. The patient will be scheduled for the biopsies prior to leaving our office today. BI-RADS CATEGORY  5: Highly suggestive of malignancy. Electronically Signed   By: Audie Pinto M.D.   On: 10/25/2020 12:55   [place] AXILLARY NODE CORE BIOPSY RIGHT  Addendum Date: 11/01/2020   ADDENDUM REPORT: 11/01/2020 13:25 ADDENDUM: Pathology revealed GRADE III INVASIVE DUCTAL CARCINOMA, DUCTAL CARCINOMA IN SITU, HIGH GRADE WITH NECROSIS of the  RIGHT breast, 10 o'clock. This was found to be concordant by Dr. Peggye Fothergill. Pathology revealed GRADE III INVASIVE DUCTAL CARCINOMA, DUCTAL CARCINOMA IN SITU, HIGH GRADE WITH NECROSIS, SCANT FRAGMENT OF SKIN--NEGATIVE FOR CARCINOMA of the RIGHT breast, 12 o'clock. This was found to be concordant by Dr. Peggye Fothergill. 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 This was found to be concordant by Dr. Peggye Fothergill. Pathology results were discussed with the patient by telephone. The patient reported doing well after the biopsies with tenderness at the sites. Post biopsy instructions and care were reviewed and questions were answered. The patient was encouraged to call [facility] for any additional concerns. The patient was referred to [facility] on November 07, 2020. Recommendation: Breast MRI to define the extent of disease as it is unclear on mammography and ultrasound. Pathology results reported by Stacie Acres RN on 11/01/2020. Electronically Signed   By: Evangeline Dakin M.D.   On: 11/01/2020 13:25   Addendum Date: 11/01/2020   ADDENDUM REPORT: 11/01/2020 09:17 ADDENDUM: In the findings section, in the # 3) RIGHT  axillary lymph node paragraph, it should read that a RIGHT axilla was biopsied. Electronically Signed   By: Evangeline Dakin M.D.   On: 11/01/2020 09:17   Result Date: 11/01/2020 CLINICAL DATA:  45 year old who presented with a palpable RIGHT breast lump approximately 1 month, shown to have a large 6+ cm heterogeneous mass involving the UPPER OUTER QUADRANT of the RIGHT breast associated with suspicious calcifications and associated with skin thickening and erythema, raising the question of inflammatory breast cancer. She also has multiple enlarged RIGHT axillary lymph nodes (at least 13 identified on mammography). Biopsy of the INFERIOR and SUPERIOR  margins of the large RIGHT breast mass and biopsy of one of the abnormal RIGHT axillary lymph nodes is performed. EXAM: ULTRASOUND-GUIDED CORE NEEDLE BIOPSY OF THE RIGHT BREAST X 2 ULTRASOUND-GUIDED CORE NEEDLE BIOPSY OF A RIGHT AXILLARY NODE COMPARISON:  Previous exam(s). FINDINGS: I met with the patient and we discussed the procedure of ultrasound-guided biopsy, including benefits and alternatives. We discussed the high likelihood of a successful procedure. We discussed the risks of the procedure, including infection, bleeding, tissue injury, clip migration, and inadequate sampling. Informed written consent was given. The usual time-out protocol was performed immediately prior to the procedure. # 1) RIGHT breast mass, inferior, labeled 10 o'clock for pathology, lesion quadrant: UPPER OUTER QUADRANT. Initially, using sterile technique with chlorhexidine as skin antisepsis, 1% lidocaine and 1% lidocaine with epinephrine as local anesthetic, under direct ultrasound visualization, a 12 gauge Bard Marquee core needle device placed through an 11 gauge introducer needle was used to perform biopsy of the large vague mass in the UPPER OUTER QUADRANT at its 10 o'clock location using a lateral approach. At the conclusion of the procedure, a ribbon shaped tissue marker clip was deployed into the biopsy cavity. # 2) RIGHT breast mass, superior, labeled 12 o'clock for pathology, lesion quadrant: UPPER OUTER QUADRANT. Next, using sterile technique with chlorhexidine as skin antisepsis, 1% lidocaine and 1% lidocaine with epinephrine as local anesthetic, under direct ultrasound visualization, a 12 gauge Bard Marquee core needle device placed through an 11 gauge introducer needle was used perform biopsy of the large vague mass in the UPPER OUTER QUADRANT at its near 12 o'clock location using a lateral approach. At the conclusion of the procedure, a coil shaped tissue marker clip was deployed into the biopsy cavity. # 3) RIGHT  axillary lymph node: Using sterile technique with chlorhexidine as skin antisepsis, 1% lidocaine 1% lidocaine with epinephrine as local anesthetic, under direct ultrasound visualization, a 14 gauge Bard Marquee core needle device placed through a 13 gauge introducer needle was used perform biopsy of a pathologic low LEFT axillary lymph node demonstrating marked cortical thickening. At the conclusion of the procedure, a Tribell tissue marker clip was deployed into the biopsy cavity. Follow up 2 view mammogram was performed and dictated separately. IMPRESSION: Ultrasound guided biopsy of the peripheral margins of a large mass involving the UPPER OUTER QUADRANT of the RIGHT breast (x 2) and biopsy of a pathologic RIGHT axillary lymph node. No apparent complications. Electronically Signed: By: Evangeline Dakin M.D. On: 10/31/2020 14:24   MM CLIP PLACEMENT RIGHT  Result Date: 10/31/2020 CLINICAL DATA:  Confirmation of clip placement after ultrasound-guided core needle biopsies of the RIGHT breast (x 2) and a RIGHT axillary lymph node. EXAM: 2D AND 3D DIAGNOSTIC RIGHT MAMMOGRAM POST ULTRASOUND BIOPSY COMPARISON:  Previous exam(s). FINDINGS: Tomosynthesis and synthesized full field CC and MLO images were obtained following ultrasound guided biopsies of the RIGHT breast  x 2 and a RIGHT axillary lymph node. The ribbon shaped biopsy marking clip is appropriately positioned at the site of the biopsy in the [place] at the near 10 o'clock position. This is at the INFERIOR margin of the large mass. The coil shaped biopsy marking clip is appropriately positioned at the site of the biopsy in the [place] at the near 11 o'clock position. This is at the SUPERIOR margin of the large mass. The Tribell biopsy marker clip is appropriately positioned within the biopsied lymph node in the low axilla. The ribbon clip and coil clip are approximately 2.7 cm apart. The calcifications extend approximately 1.8 cm  anterior to coil clip and approximately 1.9 cm posterior to the ribbon clip on the CC image. Expected post biopsy changes are present at each site without evidence of hematoma IMPRESSION: 1. Appropriate positioning of the ribbon shaped biopsy marking clip at the site of biopsy in the UPPER OUTER QUADRANT of the RIGHT breast. The ribbon clip is at the INFERIOR margin of the large palpable mass. 2. Appropriate positioning of the coil shaped biopsy marking clip at the site of biopsy in the UPPER OUTER QUADRANT of the RIGHT breast. The coil clip is at the SUPERIOR margin of the large palpable mass. 3. Appropriate positioning of the Tribell clip within the biopsied lymph node in the low RIGHT axilla. 4. The ribbon clip and the coil clip approximately 2.7 cm apart. 5. Calcifications extend approximately 2 cm anterior to the coil clip and approximately 2 cm posterior to the ribbon clip on the CC image. 1. Final Assessment: Post Procedure Mammograms for Marker Placement Electronically Signed   By: Evangeline Dakin M.D.   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 This was found to be concordant by Dr. Peggye Fothergill. Pathology revealed GRADE III INVASIVE DUCTAL CARCINOMA, DUCTAL CARCINOMA IN SITU, HIGH GRADE WITH NECROSIS, SCANT FRAGMENT OF SKIN--NEGATIVE FOR CARCINOMA of the RIGHT breast, 12 o'clock. This was found to be concordant by Dr. Peggye Fothergill. 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 This was found to be concordant by Dr. Peggye Fothergill. Pathology results were discussed with the patient by telephone. The patient reported doing well after the biopsies with tenderness at the sites. Post biopsy instructions and care  were reviewed and questions were answered. The patient was encouraged to call [facility] for any additional concerns. The patient was referred to [facility] on November 07, 2020. Recommendation: Breast MRI to define the extent of disease as it is unclear on mammography and ultrasound. Pathology results reported by Stacie Acres RN on 11/01/2020. Electronically Signed   By: Evangeline Dakin M.D.   On: 11/01/2020 13:25   Addendum Date: 11/01/2020   ADDENDUM REPORT: 11/01/2020 09:17 ADDENDUM: In the findings section, in the # 3) RIGHT axillary lymph node paragraph, it should read that a RIGHT axilla was biopsied. Electronically Signed   By: Evangeline Dakin M.D.   On: 11/01/2020 09:17   Result Date: 11/01/2020 CLINICAL DATA:  45 year old who presented with a palpable RIGHT breast lump approximately 1 month, shown to have a large 6+ cm  heterogeneous mass involving the UPPER OUTER QUADRANT of the RIGHT breast associated with suspicious calcifications and associated with skin thickening and erythema, raising the question of inflammatory breast cancer. She also has multiple enlarged RIGHT axillary lymph nodes (at least 13 identified on mammography). Biopsy of the INFERIOR and SUPERIOR margins of the large RIGHT breast mass and biopsy of one of the abnormal RIGHT axillary lymph nodes is performed. EXAM: ULTRASOUND-GUIDED CORE NEEDLE BIOPSY OF THE RIGHT BREAST X 2 ULTRASOUND-GUIDED CORE NEEDLE BIOPSY OF A RIGHT AXILLARY NODE COMPARISON:  Previous exam(s). FINDINGS: I met with the patient and we discussed the procedure of ultrasound-guided biopsy, including benefits and alternatives. We discussed the high likelihood of a successful procedure. We discussed the risks of the procedure, including infection, bleeding, tissue injury, clip migration, and inadequate sampling. Informed written consent was given. The usual time-out  protocol was performed immediately prior to the procedure. # 1) RIGHT breast mass, inferior, labeled 10 o'clock for pathology, lesion quadrant: UPPER OUTER QUADRANT. Initially, using sterile technique with chlorhexidine as skin antisepsis, 1% lidocaine and 1% lidocaine with epinephrine as local anesthetic, under direct ultrasound visualization, a 12 gauge Bard Marquee core needle device placed through an 11 gauge introducer needle was used to perform biopsy of the large vague mass in the UPPER OUTER QUADRANT at its 10 o'clock location using a lateral approach. At the conclusion of the procedure, a ribbon shaped tissue marker clip was deployed into the biopsy cavity. # 2) RIGHT breast mass, superior, labeled 12 o'clock for pathology, lesion quadrant: UPPER OUTER QUADRANT. Next, using sterile technique with chlorhexidine as skin antisepsis, 1% lidocaine and 1% lidocaine with epinephrine as local anesthetic, under direct ultrasound visualization, a 12 gauge Bard Marquee core needle device placed through an 11 gauge introducer needle was used perform biopsy of the large vague mass in the UPPER OUTER QUADRANT at its near 12 o'clock location using a lateral approach. At the conclusion of the procedure, a coil shaped tissue marker clip was deployed into the biopsy cavity. # 3) RIGHT axillary lymph node: Using sterile technique with chlorhexidine as skin antisepsis, 1% lidocaine 1% lidocaine with epinephrine as local anesthetic, under direct ultrasound visualization, a 14 gauge Bard Marquee core needle device placed through a 13 gauge introducer needle was used perform biopsy of a pathologic low LEFT axillary lymph node demonstrating marked cortical thickening. At the conclusion of the procedure, a Tribell tissue marker clip was deployed into the biopsy cavity. Follow up 2 view mammogram was performed and dictated separately. IMPRESSION: Ultrasound guided biopsy of the peripheral margins of a large mass involving the UPPER  OUTER QUADRANT of the RIGHT breast (x 2) and biopsy of a pathologic RIGHT axillary lymph node. No apparent complications. Electronically Signed: By: Evangeline Dakin M.D. 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 This was found to be concordant by Dr. Peggye Fothergill. Pathology revealed GRADE III INVASIVE DUCTAL CARCINOMA, DUCTAL CARCINOMA IN SITU, HIGH GRADE WITH NECROSIS, SCANT FRAGMENT OF SKIN--NEGATIVE FOR CARCINOMA of the RIGHT breast, 12 o'clock. This was found to be concordant by Dr. Peggye Fothergill. 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 This was found to be concordant by Dr. Peggye Fothergill. Pathology results were  discussed with the patient by telephone. The patient reported doing well after the biopsies with tenderness at the sites. Post biopsy instructions and care were reviewed and questions were answered. The patient was encouraged to call [facility] for any additional concerns. The patient was referred to [facility] on November 07, 2020. Recommendation: Breast MRI to define the extent of disease as it is unclear on mammography and ultrasound. Pathology results reported by Stacie Acres RN on 11/01/2020. Electronically Signed   By: Evangeline Dakin M.D.   On: 11/01/2020 13:25   Addendum Date: 11/01/2020   ADDENDUM REPORT: 11/01/2020 09:17 ADDENDUM: In the findings section, in the # 3) RIGHT axillary lymph node paragraph, it should read that a RIGHT axilla was biopsied. Electronically Signed   By: Evangeline Dakin M.D.   On: 11/01/2020 09:17   Result Date: 11/01/2020 CLINICAL DATA:  45 year old who  presented with a palpable RIGHT breast lump approximately 1 month, shown to have a large 6+ cm heterogeneous mass involving the UPPER OUTER QUADRANT of the RIGHT breast associated with suspicious calcifications and associated with skin thickening and erythema, raising the question of inflammatory breast cancer. She also has multiple enlarged RIGHT axillary lymph nodes (at least 13 identified on mammography). Biopsy of the INFERIOR and SUPERIOR margins of the large RIGHT breast mass and biopsy of one of the abnormal RIGHT axillary lymph nodes is performed. EXAM: ULTRASOUND-GUIDED CORE NEEDLE BIOPSY OF THE RIGHT BREAST X 2 ULTRASOUND-GUIDED CORE NEEDLE BIOPSY OF A RIGHT AXILLARY NODE COMPARISON:  Previous exam(s). FINDINGS: I met with the patient and we discussed the procedure of ultrasound-guided biopsy, including benefits and alternatives. We discussed the high likelihood of a successful procedure. We discussed the risks of the procedure, including infection, bleeding, tissue injury, clip migration, and inadequate sampling. Informed written consent was given. The usual time-out protocol was performed immediately prior to the procedure. # 1) RIGHT breast mass, inferior, labeled 10 o'clock for pathology, lesion quadrant: UPPER OUTER QUADRANT. Initially, using sterile technique with chlorhexidine as skin antisepsis, 1% lidocaine and 1% lidocaine with epinephrine as local anesthetic, under direct ultrasound visualization, a 12 gauge Bard Marquee core needle device placed through an 11 gauge introducer needle was used to perform biopsy of the large vague mass in the UPPER OUTER QUADRANT at its 10 o'clock location using a lateral approach. At the conclusion of the procedure, a ribbon shaped tissue marker clip was deployed into the biopsy cavity. # 2) RIGHT breast mass, superior, labeled 12 o'clock for pathology, lesion quadrant: UPPER OUTER QUADRANT. Next, using sterile technique with chlorhexidine as skin antisepsis, 1%  lidocaine and 1% lidocaine with epinephrine as local anesthetic, under direct ultrasound visualization, a 12 gauge Bard Marquee core needle device placed through an 11 gauge introducer needle was used perform biopsy of the large vague mass in the UPPER OUTER QUADRANT at its near 12 o'clock location using a lateral approach. At the conclusion of the procedure, a coil shaped tissue marker clip was deployed into the biopsy cavity. # 3) RIGHT axillary lymph node: Using sterile technique with chlorhexidine as skin antisepsis, 1% lidocaine 1% lidocaine with epinephrine as local anesthetic, under direct ultrasound visualization, a 14 gauge Bard Marquee core needle device placed through a 13 gauge introducer needle was used perform biopsy of a pathologic low LEFT axillary lymph node demonstrating marked cortical thickening. At the conclusion of the procedure, a Tribell tissue marker clip was  deployed into the biopsy cavity. Follow up 2 view mammogram was performed and dictated separately. IMPRESSION: Ultrasound guided biopsy of the peripheral margins of a large mass involving the UPPER OUTER QUADRANT of the RIGHT breast (x 2) and biopsy of a pathologic RIGHT axillary lymph node. No apparent complications. Electronically Signed: By: Evangeline Dakin M.D. On: 10/31/2020 14:24      IMPRESSION: Stage T4, N1 Right Breast UOQ, Invasive Ductal Carcinoma, ER- / PR- / Her2-, Grade 3  Patient will likely require mastectomy given the clinical findings today. Her exam is very concerning for skin involvement over the large breast mass, as well as some of the LIQ of the breast. Patient would benefit from radiation therapy either as breast conserving treatment or postmastectomy given her clinical presentation with large tumor and multiple suspicious lymph nodes. We discussed the general course of radiation, potential side effects, and toxicities with radiation and the patient is interested in this approach.    PLAN:  1. MRI,  Genetics, CT/bone scan 2. Neoadjuvant chemotherapy 3. Surgery to be determined, with ALND 4. Adjuvant radiation therapy    ------------------------------------------------  Blair Promise, PhD, MD   This document serves as a record of services personally performed by Gery Pray, MD. It was created on his behalf by Wilburn Mylar, a trained medical scribe. The creation of this record is based on the scribe's personal observations and the provider's statements to them. This document has been checked and approved by the attending provider.

## 2020-11-07 NOTE — Research (Signed)
Trial:  [id number] - TREATMENT OF REFRACTORY NAUSEA Patient Amber White was identified by Dr. Lindi Adie as a potential candidate for the above listed study.  This Clinical Research Nurse met with Amber White, [id number], on 11/07/20 in a manner and location that ensures patient privacy to discuss participation in the above listed research study.  Patient is Accompanied by her mother and sister.  A copy of the informed consent document and separate HIPAA Authorization was provided to the patient.  Patient reads, speaks, and understands [place].  Patient was informed of the voluntary nature of participating in this research study.  Patient was provided with the business card of this Nurse and encouraged to contact the research team with any questions.  Approximately 5 minutes were spent with the patient reviewing the informed consent documents.  Patient was provided the option of taking informed consent documents home to review and was encouraged to review at their convenience with their support network, including other care providers. Patient took the consent documents home to review.  Patient agreed this research nurse could contact her next week to follow up on her interest in this study.  Thanked patient for her time today.  Foye Spurling, BSN, RN Clinical Research Nurse 11/07/2020 3:57 PM

## 2020-11-07 NOTE — Patient Instructions (Signed)

## 2020-11-07 NOTE — Progress Notes (Addendum)
REFERRING PROVIDER: Nicholas Lose, MD [place],  [place] [id number]  PRIMARY PROVIDER:  Patient, No Pcp Per (Inactive)  PRIMARY REASON FOR VISIT:  1. Malignant neoplasm of upper-outer quadrant of right breast in female, estrogen receptor negative ([place])   2. Family history of breast cancer    I connected with Amber White on 11/07/2020 at 3:10 PM  EDT by [facility] video conference and verified that I am speaking with the correct person using two identifiers.    Patient location: [place] Provider location: [place]:   Amber White, a 45 y.o. female, was seen for a [place] cancer genetics consultation at the request of Dr. Lindi Adie due to a personal and family history of cancer.  Amber White presents to clinic today to discuss the possibility of a hereditary predisposition to cancer, genetic testing, and to further clarify her future cancer risks, as well as potential cancer risks for family members.   In 2022, at the age of 81, Amber White was diagnosed with invasive ductal carcinoma with DCIS of the right breast, triple negative. The treatment plan currently includes neoadjuvant chemotherapy followed by surgery and adjuvant radiation. Amber White does not feel genetic test results would change her surgical plan.   CANCER HISTORY:  Oncology History  Malignant neoplasm of upper-outer quadrant of right breast in female, estrogen receptor negative ([place])  10/31/2020 Initial Diagnosis   Patient palpated a right breast mass x19mo. Diagnostic mammogram and US showed an abnormality and calcifications in the right breast, 8.2cm, with skin thickening and right axillary adenopathy. 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   11/07/2020 Cancer Staging   Staging form: Breast, AJCC 8th Edition - Clinical: Stage IIIC (cT4, cN1, cM0, G3, ER-, PR-, HER2:  Equivocal) - Signed by Nicholas Lose, MD on 11/07/2020 Histologic grading system: 3 grade system      RISK FACTORS:  Menarche was at age 68.  Ovaries intact: yes.  Hysterectomy: no.  Mammogram within the last year: yes.  Past Medical History:  Diagnosis Date  . Family history of breast cancer   . No pertinent past medical history     Past Surgical History:  Procedure Laterality Date  . CHOLECYSTECTOMY      Social History   Socioeconomic History  . Marital status: Single    Spouse name: Not on file  . Number of children: Not on file  . Years of education: Not on file  . Highest education level: Not on file  Occupational History  . Not on file  Tobacco Use  . Smoking status: Never Smoker  . Smokeless tobacco: Never Used  Vaping Use  . Vaping Use: Never used  Substance and Sexual Activity  . Alcohol use: Yes    Comment: rare  . Drug use: No  . Sexual activity: Yes    Birth control/protection: None  Other Topics Concern  . Not on file  Social History Narrative  . Not on file   Social Determinants of Health   Financial Resource Strain: Not on file  Food Insecurity: Not on file  Transportation Needs: Not on file  Physical Activity: Not on file  Stress: Not on file  Social Connections: Not on file     FAMILY HISTORY:  We obtained a detailed, 4-generation family history.  Significant diagnoses are listed below: Family History  Problem Relation Age  of Onset  . Breast cancer Mother   . Diabetes Maternal Uncle   . Diabetes Maternal Grandmother   . Heart disease Maternal Grandmother    Amber White has 4 daughters. She has 1 maternal half brother, 1 maternal half sister.  Amber White mother is living at 54 and had breast cancer at 58. Patient has 5 maternal uncles, 2 maternal aunts, no cancers. Maternal grandmother passed at 41, unknown age of death for grandfather.  Amber White has no information about her paternal side.   Amber White is unaware of previous  family history of genetic testing for hereditary cancer risks.  There is no reported Ashkenazi Jewish ancestry. There is no known consanguinity.    GENETIC COUNSELING ASSESSMENT: Ms. Mangen is a 45 y.o. female with a personal and family history of breast cancer which is somewhat suggestive of a hereditary cancer syndrome and predisposition to cancer. We, therefore, discussed and recommended the following at today's visit.   DISCUSSION: We discussed that approximately 5-10% of breast cancer is hereditary  Most cases of hereditary breast cancer are associated with BRCA1/BRCA2 genes, although there are other genes associated with hereditary breast cancer as well .  We discussed that testing is beneficial for several reasons including surgical decision-making for breast cancer, knowing about other cancer risks, identifying potential screening and risk-reduction options that may be appropriate, and to understand if other family members could be at risk for cancer and allow them to undergo genetic testing.   We reviewed the characteristics, features and inheritance patterns of hereditary cancer syndromes. We also discussed genetic testing, including the appropriate family members to test, the process of testing, insurance coverage and turn-around-time for results. We discussed the implications of a negative, positive and/or variant of uncertain significant result. We recommended Amber White pursue genetic testing for the [facility] CancerNext-Expanded+RNA gene panel.   The CancerNext-Expanded + RNAinsight gene panel offered by [employer] and includes sequencing and rearrangement analysis for the following 77 genes: IP, ALK, APC*, ATM*, AXIN2, BAP1, BARD1, BLM, BMPR1A, BRCA1*, BRCA2*, BRIP1*, CDC73, CDH1*,CDK4, CDKN1B, CDKN2A, CHEK2*, CTNNA1, DICER1, FANCC, FH, FLCN, GALNT12, KIF1B, LZTR1, MAX, MEN1, MET, MLH1*, MSH2*, MSH3, MSH6*, MUTYH*, NBN, NF1*, NF2, NTHL1, PALB2*, PHOX2B, PMS2*, POT1, PRKAR1A, PTCH1, PTEN*,  RAD51C*, RAD51D*,RB1, RECQL, RET, SDHA, SDHAF2, SDHB, SDHC, SDHD, SMAD4, SMARCA4, SMARCB1, SMARCE1, STK11, SUFU, TMEM127, TP53*,TSC1, TSC2, VHL and XRCC2 (sequencing and deletion/duplication); EGFR, EGLN1, HOXB13, KIT, MITF, PDGFRA, POLD1 and POLE (sequencing only); EPCAM and GREM1 (deletion/duplication only).  Based on Amber White personal and family history of cancer, she meets medical criteria for genetic testing. Despite that she meets criteria, she may still have an out of pocket cost. We discussed that if her out of pocket cost for testing is over $100, the laboratory will call and confirm whether she wants to proceed with testing.  If the out of pocket cost of testing is less than $100 she will be billed by the genetic testing laboratory.   PLAN: After considering the risks, benefits, and limitations, Amber White provided informed consent to pursue genetic testing and the blood sample was sent to [facility] for analysis of the CancerNext-Expanded+RNA panel.. Results should be available within approximately 2-3 weeks' time, at which point they will be disclosed by telephone to Amber White, as will any additional recommendations warranted by these results. Amber White will receive a summary of her genetic counseling visit and a copy of her results once available. This information will also be available in Epic.   Ms.  White's questions were answered to her satisfaction today. Our contact information was provided should additional questions or concerns arise. Thank you for the referral and allowing [place] to share in the care of your patient.   Faith Rogue, MS, [facility] Genetic Counselor [email or web address] Phone: [phone]  The patient was seen for a total of 15 minutes in face-to-face genetic counseling. Patient's mother and sister were also present. Dr. Grayland Ormond was available for discussion regarding this case.    _______________________________________________________________________ For Office Staff:  Number of people involved in session: 3 Was an Intern/ student involved with case: no

## 2020-11-08 NOTE — Progress Notes (Signed)
[place] Psychosocial Distress Screening Counseling Intern  Counseling intern was referred by distress screening protocol.  The patient scored a 1 on the Psychosocial Distress Thermometer which indicates mild distress. Counseling intern met with patient in exam room" to assess for distress and other psychosocial needs. The intern met with patient, her mother, and her sister. They all described that the patient was fine and doing strong. The patient did not want to share much at this time.   ONCBCN DISTRESS SCREENING 11/08/2020  Screening Type Initial Screening  Distress experienced in past week (1-10) 0  Referral to support programs Yes    Follow up needed: No.  Gaylyn Rong Counseling Intern

## 2020-11-11 ENCOUNTER — Ambulatory Visit
Admission: RE | Admit: 2020-11-11 | Discharge: 2020-11-11 | Disposition: A | Discharge status: Home / Self Care | Payer: No Typology Code available for payment source | Source: Ambulatory Visit | Attending: Internal Medicine | Admitting: Internal Medicine

## 2020-11-11 ENCOUNTER — Other Ambulatory Visit: Payer: Self-pay

## 2020-11-11 DIAGNOSIS — C50911 Malignant neoplasm of unspecified site of right female breast: Secondary | ICD-10-CM

## 2020-11-11 MED ORDER — GADOBENATE DIMEGLUMINE 529 MG/ML IV SOLN
20.0000 mL | Freq: Once | INTRAVENOUS | Status: AC | PRN
  Administered 2020-11-11: 20 mL via INTRAVENOUS

## 2020-11-12 ENCOUNTER — Encounter: Payer: Self-pay | Admitting: *Deleted

## 2020-11-12 ENCOUNTER — Telehealth: Payer: Self-pay | Admitting: *Deleted

## 2020-11-12 NOTE — Telephone Encounter (Signed)
Spoke with patient to follow up from [facility] 4/27 and assess navigation needs.  She was somewhat upset because she did not receive a phone call with her appointments for chemo as she is trying to work and figure out her schedule for that.  Expressed apologies and Informed her I would give her concerns to our practice administrator for follow up. Reviewed appointments with her and she verbalized understanding.  Encouraged her to call should she have any other questions or concerns.

## 2020-11-13 ENCOUNTER — Other Ambulatory Visit: Payer: Self-pay | Admitting: *Deleted

## 2020-11-13 ENCOUNTER — Telehealth: Payer: Self-pay | Admitting: Hematology and Oncology

## 2020-11-13 DIAGNOSIS — Z171 Estrogen receptor negative status [ER-]: Secondary | ICD-10-CM

## 2020-11-13 DIAGNOSIS — C50411 Malignant neoplasm of upper-outer quadrant of right female breast: Secondary | ICD-10-CM

## 2020-11-13 NOTE — Telephone Encounter (Signed)
Scheduled appts per 4/29 sch msg. Called pt to go over appts today. Pt said she had already spoke to [facility] about appointments. Pt did say she was upset the appointments were scheduled without her being called right away. I explained to her that with out very limited availability in infusion we have to schedule what slots are available. I told her we do try to work with everyone's schedule as much as possible. She still seemed very upset. I passed her information along to the Engineer, building services.

## 2020-11-14 ENCOUNTER — Ambulatory Visit (HOSPITAL_COMMUNITY)
Admission: RE | Admit: 2020-11-14 | Discharge: 2020-11-14 | Disposition: A | Discharge status: Home / Self Care | Payer: No Typology Code available for payment source | Source: Ambulatory Visit | Attending: Hematology and Oncology | Admitting: Hematology and Oncology

## 2020-11-14 ENCOUNTER — Other Ambulatory Visit: Payer: Self-pay

## 2020-11-14 ENCOUNTER — Encounter (HOSPITAL_COMMUNITY): Payer: Self-pay

## 2020-11-14 DIAGNOSIS — Z171 Estrogen receptor negative status [ER-]: Secondary | ICD-10-CM | POA: Diagnosis present

## 2020-11-14 DIAGNOSIS — C50411 Malignant neoplasm of upper-outer quadrant of right female breast: Secondary | ICD-10-CM | POA: Diagnosis present

## 2020-11-14 HISTORY — DX: Malignant (primary) neoplasm, unspecified: C80.1

## 2020-11-14 MED ORDER — TECHNETIUM TC 99M MEDRONATE IV KIT
22.0000 | PACK | Freq: Once | INTRAVENOUS | Status: AC | PRN
  Administered 2020-11-14: 22 via INTRAVENOUS

## 2020-11-14 MED ORDER — IOHEXOL 300 MG/ML  SOLN
100.0000 mL | Freq: Once | INTRAMUSCULAR | Status: AC | PRN
  Administered 2020-11-14: 100 mL via INTRAVENOUS

## 2020-11-14 MED ORDER — SODIUM CHLORIDE (PF) 0.9 % IJ SOLN
INTRAMUSCULAR | Status: AC
  Filled 2020-11-14: qty 50

## 2020-11-15 ENCOUNTER — Encounter (HOSPITAL_COMMUNITY): Payer: No Typology Code available for payment source

## 2020-11-15 ENCOUNTER — Encounter: Payer: Self-pay | Admitting: *Deleted

## 2020-11-15 NOTE — Progress Notes (Signed)
The following biosimilar Udenyca ([id number]) has been selected for use in this patient per patients insurance.  Henreitta Leber, PharmD 11/15/20 @ 1330

## 2020-11-16 ENCOUNTER — Encounter: Payer: Self-pay | Admitting: *Deleted

## 2020-11-16 ENCOUNTER — Telehealth: Payer: Self-pay | Admitting: *Deleted

## 2020-11-16 NOTE — Telephone Encounter (Addendum)
[facility] Nausea Study; Called patient to follow up on her interest in this study. There is no answer and no VM. Will try again later.  Foye Spurling, BSN, RN Clinical Research Nurse 11/16/2020 10:38 AM   Called again and no answer. Will try to reach patient on Monday.  Foye Spurling, BSN, RN Clinical Research Nurse 11/16/2020 4:28 PM

## 2020-11-19 ENCOUNTER — Encounter (HOSPITAL_BASED_OUTPATIENT_CLINIC_OR_DEPARTMENT_OTHER): Payer: Self-pay | Admitting: Surgery

## 2020-11-19 ENCOUNTER — Other Ambulatory Visit (HOSPITAL_COMMUNITY): Payer: No Typology Code available for payment source

## 2020-11-19 ENCOUNTER — Ambulatory Visit (HOSPITAL_COMMUNITY)
Admission: RE | Admit: 2020-11-19 | Discharge: 2020-11-19 | Disposition: A | Discharge status: Home / Self Care | Payer: No Typology Code available for payment source | Source: Ambulatory Visit | Attending: Hematology and Oncology | Admitting: Hematology and Oncology

## 2020-11-19 ENCOUNTER — Other Ambulatory Visit: Payer: Self-pay

## 2020-11-19 ENCOUNTER — Encounter: Payer: Self-pay | Admitting: *Deleted

## 2020-11-19 ENCOUNTER — Inpatient Hospital Stay: Payer: No Typology Code available for payment source | Attending: Hematology and Oncology

## 2020-11-19 ENCOUNTER — Encounter: Payer: Self-pay | Admitting: Hematology and Oncology

## 2020-11-19 DIAGNOSIS — C50411 Malignant neoplasm of upper-outer quadrant of right female breast: Secondary | ICD-10-CM | POA: Insufficient documentation

## 2020-11-19 DIAGNOSIS — Z171 Estrogen receptor negative status [ER-]: Secondary | ICD-10-CM | POA: Insufficient documentation

## 2020-11-19 DIAGNOSIS — Z5181 Encounter for therapeutic drug level monitoring: Secondary | ICD-10-CM | POA: Insufficient documentation

## 2020-11-19 DIAGNOSIS — Z79899 Other long term (current) drug therapy: Secondary | ICD-10-CM | POA: Insufficient documentation

## 2020-11-19 DIAGNOSIS — Z5112 Encounter for antineoplastic immunotherapy: Secondary | ICD-10-CM | POA: Insufficient documentation

## 2020-11-19 DIAGNOSIS — Z5189 Encounter for other specified aftercare: Secondary | ICD-10-CM | POA: Insufficient documentation

## 2020-11-19 DIAGNOSIS — Z0189 Encounter for other specified special examinations: Secondary | ICD-10-CM

## 2020-11-19 DIAGNOSIS — C773 Secondary and unspecified malignant neoplasm of axilla and upper limb lymph nodes: Secondary | ICD-10-CM | POA: Insufficient documentation

## 2020-11-19 DIAGNOSIS — Z5111 Encounter for antineoplastic chemotherapy: Secondary | ICD-10-CM | POA: Insufficient documentation

## 2020-11-19 LAB — ECHOCARDIOGRAM COMPLETE
AR max vel: 3.07 cm2
AV Area VTI: 2.83 cm2
AV Area mean vel: 2.8 cm2
AV Mean grad: 5 mmHg
AV Peak grad: 9.1 mmHg
Ao pk vel: 1.51 m/s
Area-P 1/2: 3.27 cm2
Calc EF: 55.8 %
Height: 64 in
S' Lateral: 2.5 cm
Single Plane A2C EF: 61.9 %
Single Plane A4C EF: 49.7 %
Weight: 4507.97 oz

## 2020-11-19 NOTE — Research (Signed)
Trial:  [id number] - TREATMENT OF REFRACTORY NAUSEA This Clinical Research Nurse met with patient after her chemotherapy education class to follow up on her interest in this study. Patient initially stated she wanted to enroll in the study but she did not have time today to review the consent forms with research nurse. Briefly discussed the study activities timeline to inform patient of study required activities to be completed prior to starting chemotherapy. These activities include reviewing/signing ICF, having a pregnancy test, completing baseline questionnaires and having research samples drawn (during her lab appointment prior to chemotherapy). Informed patient of possibility of receiving additional anti nausea drugs at cycle 2 if she experiences at least moderate amount of nausea after cycle 1. Informed patient of the double blinded nature of the study and possibility of taking placebo or study drug. Provided patient with another copy of the ICF and asked patient if research nurse could call her tomorrow to discuss further and she agreed. Also gave patient my business card and asked her to call with any questions she might have before I call. Patient verbalized understanding.  Thanked patient for her time today.  Foye Spurling, BSN, RN Clinical Research Nurse 11/19/2020 11:55 AM

## 2020-11-19 NOTE — Progress Notes (Signed)
*  PRELIMINARY RESULTS* Echocardiogram 2D Echocardiogram has been performed.  Luisa Hart RDCS 11/19/2020, 10:09 AM

## 2020-11-19 NOTE — Progress Notes (Signed)
The following biosimilar Ziextenzo ([id number]-bmez) has been selected for use in this patient is the insurance preferred.  They recalled the Udenyca and approved the Ziextenzo.  Henreitta Leber, PharmD 11/19/20 @ 1100

## 2020-11-19 NOTE — Progress Notes (Signed)
Pre op call done with pt. Pt has full day of testing and classes  scheduled today and tomorrow at [facility] [place].Marland Kitchen Chart reviewed with Dr Linna Caprice and pt will not need to come in for an anesthesia consult prior to the day of surgery.

## 2020-11-19 NOTE — Progress Notes (Signed)
Met with patient at registration to introduce myself as Arboriculturist and to offer available resources.  Discussed one-time $1000 Radio broadcast assistant to assist with personal expenses while going through treatment. Also, discussed available copay assistance for specific treatment drugs if she has not met her ded/OOP. She states she has not met OOP. Advised I can apply on her behalf if she would like, and she agreed.  She has my card for any additional financial questions or concerns.

## 2020-11-20 ENCOUNTER — Other Ambulatory Visit (HOSPITAL_COMMUNITY)
Admission: RE | Admit: 2020-11-20 | Discharge: 2020-11-20 | Disposition: A | Discharge status: Home / Self Care | Payer: No Typology Code available for payment source | Source: Ambulatory Visit | Attending: Surgery | Admitting: Surgery

## 2020-11-20 ENCOUNTER — Telehealth: Payer: Self-pay | Admitting: *Deleted

## 2020-11-20 ENCOUNTER — Encounter: Payer: Self-pay | Admitting: Hematology and Oncology

## 2020-11-20 DIAGNOSIS — Z01812 Encounter for preprocedural laboratory examination: Secondary | ICD-10-CM | POA: Insufficient documentation

## 2020-11-20 DIAGNOSIS — Z20822 Contact with and (suspected) exposure to covid-19: Secondary | ICD-10-CM | POA: Insufficient documentation

## 2020-11-20 LAB — SARS CORONAVIRUS 2 (TAT 6-24 HRS): SARS Coronavirus 2: NEGATIVE

## 2020-11-20 NOTE — Progress Notes (Signed)
Sent message reminding pt to go for covid test today. 

## 2020-11-20 NOTE — Progress Notes (Signed)
Patient called to inquire about [employer]. Advised what is needed to apply.She will bring on 11/22/20 to complete process.  She has my card for any additional financial questions or concerns.

## 2020-11-20 NOTE — Telephone Encounter (Signed)
[facility] Nausea Study; Called patient to follow up on this study. Offered to go through consent form with patient on the phone and patient declined. She stated she did have a chance to review the consent form and made the decision to decline. She does not feel comfortable taking medications if she will not know what she is taking and is also concerned about potential side effects.  Thanked patient for considering this study and taking the time to speak with research nurse. Dr. Lindi Adie notified.   DCP-001; Introduced this study to patient informing her of the purpose, possible risks and benefits. Informed patient research nurse could complete with patient during one of her chemotherapy appointments. Patient declined stating she would rather not participate.  Thanked patient again for taking her time to discuss with research nurse.   Foye Spurling, BSN, RN Clinical Research Nurse 11/20/2020 11:20 AM

## 2020-11-21 ENCOUNTER — Ambulatory Visit (HOSPITAL_COMMUNITY): Payer: No Typology Code available for payment source

## 2020-11-21 ENCOUNTER — Other Ambulatory Visit: Payer: No Typology Code available for payment source

## 2020-11-21 ENCOUNTER — Ambulatory Visit (HOSPITAL_BASED_OUTPATIENT_CLINIC_OR_DEPARTMENT_OTHER): Payer: No Typology Code available for payment source | Admitting: Certified Registered"

## 2020-11-21 ENCOUNTER — Ambulatory Visit (HOSPITAL_BASED_OUTPATIENT_CLINIC_OR_DEPARTMENT_OTHER)
Admission: RE | Admit: 2020-11-21 | Discharge: 2020-11-21 | Disposition: A | Discharge status: Home / Self Care | Payer: No Typology Code available for payment source | Source: Ambulatory Visit | Attending: Surgery | Admitting: Surgery

## 2020-11-21 ENCOUNTER — Encounter (HOSPITAL_BASED_OUTPATIENT_CLINIC_OR_DEPARTMENT_OTHER): Payer: Self-pay | Admitting: Surgery

## 2020-11-21 ENCOUNTER — Encounter (HOSPITAL_BASED_OUTPATIENT_CLINIC_OR_DEPARTMENT_OTHER)
Admission: RE | Disposition: A | Discharge status: Home / Self Care | Payer: Self-pay | Source: Ambulatory Visit | Attending: Surgery

## 2020-11-21 ENCOUNTER — Other Ambulatory Visit: Payer: Self-pay

## 2020-11-21 DIAGNOSIS — Z171 Estrogen receptor negative status [ER-]: Secondary | ICD-10-CM | POA: Insufficient documentation

## 2020-11-21 DIAGNOSIS — C50911 Malignant neoplasm of unspecified site of right female breast: Secondary | ICD-10-CM | POA: Diagnosis not present

## 2020-11-21 DIAGNOSIS — Z880 Allergy status to penicillin: Secondary | ICD-10-CM | POA: Insufficient documentation

## 2020-11-21 DIAGNOSIS — Z803 Family history of malignant neoplasm of breast: Secondary | ICD-10-CM | POA: Insufficient documentation

## 2020-11-21 DIAGNOSIS — Z419 Encounter for procedure for purposes other than remedying health state, unspecified: Secondary | ICD-10-CM

## 2020-11-21 DIAGNOSIS — Z452 Encounter for adjustment and management of vascular access device: Secondary | ICD-10-CM

## 2020-11-21 HISTORY — PX: PORTACATH PLACEMENT: SHX2246

## 2020-11-21 LAB — POCT PREGNANCY, URINE: Preg Test, Ur: NEGATIVE

## 2020-11-21 SURGERY — INSERTION, TUNNELED CENTRAL VENOUS DEVICE, WITH PORT
Anesthesia: General | Site: Breast

## 2020-11-21 MED ORDER — DEXAMETHASONE SODIUM PHOSPHATE 10 MG/ML IJ SOLN
INTRAMUSCULAR | Status: AC
  Filled 2020-11-21: qty 1

## 2020-11-21 MED ORDER — PROPOFOL 10 MG/ML IV BOLUS
INTRAVENOUS | Status: DC | PRN
  Administered 2020-11-21: 200 mg via INTRAVENOUS

## 2020-11-21 MED ORDER — SODIUM BICARBONATE 4.2 % IV SOLN
INTRAVENOUS | Status: AC
  Filled 2020-11-21: qty 10

## 2020-11-21 MED ORDER — MIDAZOLAM HCL 5 MG/5ML IJ SOLN
INTRAMUSCULAR | Status: DC | PRN
  Administered 2020-11-21: 2 mg via INTRAVENOUS

## 2020-11-21 MED ORDER — HEPARIN SOD (PORK) LOCK FLUSH 100 UNIT/ML IV SOLN
INTRAVENOUS | Status: AC
  Filled 2020-11-21: qty 5

## 2020-11-21 MED ORDER — LIDOCAINE 2% (20 MG/ML) 5 ML SYRINGE
INTRAMUSCULAR | Status: AC
  Filled 2020-11-21: qty 5

## 2020-11-21 MED ORDER — HEPARIN (PORCINE) IN NACL 1000-0.9 UT/500ML-% IV SOLN
INTRAVENOUS | Status: AC
  Filled 2020-11-21: qty 500

## 2020-11-21 MED ORDER — ONDANSETRON HCL 4 MG/2ML IJ SOLN
INTRAMUSCULAR | Status: AC
  Filled 2020-11-21: qty 2

## 2020-11-21 MED ORDER — PROMETHAZINE HCL 25 MG/ML IJ SOLN: 6.2500 mg | INTRAMUSCULAR | Status: DC | PRN

## 2020-11-21 MED ORDER — ONDANSETRON HCL 4 MG/2ML IJ SOLN
INTRAMUSCULAR | Status: DC | PRN
  Administered 2020-11-21: 4 mg via INTRAVENOUS

## 2020-11-21 MED ORDER — MIDAZOLAM HCL 2 MG/2ML IJ SOLN
INTRAMUSCULAR | Status: AC
  Filled 2020-11-21: qty 2

## 2020-11-21 MED ORDER — DEXAMETHASONE SODIUM PHOSPHATE 10 MG/ML IJ SOLN
INTRAMUSCULAR | Status: DC | PRN
  Administered 2020-11-21: 5 mg via INTRAVENOUS

## 2020-11-21 MED ORDER — HYDROMORPHONE HCL 1 MG/ML IJ SOLN: 0.2500 mg | INTRAMUSCULAR | Status: DC | PRN

## 2020-11-21 MED ORDER — LACTATED RINGERS IV SOLN: INTRAVENOUS | Status: DC

## 2020-11-21 MED ORDER — PROPOFOL 10 MG/ML IV BOLUS
INTRAVENOUS | Status: AC
  Filled 2020-11-21: qty 20

## 2020-11-21 MED ORDER — LIDOCAINE HCL (CARDIAC) PF 100 MG/5ML IV SOSY
PREFILLED_SYRINGE | INTRAVENOUS | Status: DC | PRN
  Administered 2020-11-21: 60 mg via INTRAVENOUS

## 2020-11-21 MED ORDER — HEPARIN SOD (PORK) LOCK FLUSH 100 UNIT/ML IV SOLN
INTRAVENOUS | Status: DC | PRN
  Administered 2020-11-21: 500 [IU] via INTRAVENOUS

## 2020-11-21 MED ORDER — CEFAZOLIN IN SODIUM CHLORIDE 3-0.9 GM/100ML-% IV SOLN
INTRAVENOUS | Status: AC
  Filled 2020-11-21: qty 100

## 2020-11-21 MED ORDER — HYDROCODONE-ACETAMINOPHEN 5-325 MG PO TABS
1.0000 | ORAL_TABLET | Freq: Four times a day (QID) | ORAL | 0 refills | Status: DC | PRN
Start: 2020-11-21 — End: 2020-12-20

## 2020-11-21 MED ORDER — CEFAZOLIN IN SODIUM CHLORIDE 3-0.9 GM/100ML-% IV SOLN
3.0000 g | INTRAVENOUS | Status: AC
  Administered 2020-11-21: 3 g via INTRAVENOUS

## 2020-11-21 MED ORDER — OXYCODONE HCL 5 MG/5ML PO SOLN: 5.0000 mg | Freq: Once | ORAL | Status: DC | PRN

## 2020-11-21 MED ORDER — PHENYLEPHRINE HCL (PRESSORS) 10 MG/ML IV SOLN
INTRAVENOUS | Status: DC | PRN
  Administered 2020-11-21 (×3): 80 ug via INTRAVENOUS
  Administered 2020-11-21: 120 ug via INTRAVENOUS

## 2020-11-21 MED ORDER — AMISULPRIDE (ANTIEMETIC) 5 MG/2ML IV SOLN: 10.0000 mg | Freq: Once | INTRAVENOUS | Status: DC | PRN

## 2020-11-21 MED ORDER — FENTANYL CITRATE (PF) 100 MCG/2ML IJ SOLN
INTRAMUSCULAR | Status: AC
  Filled 2020-11-21: qty 2

## 2020-11-21 MED ORDER — HEPARIN (PORCINE) IN NACL 2-0.9 UNITS/ML
INTRAMUSCULAR | Status: AC | PRN
  Administered 2020-11-21: 500 mL via INTRAVENOUS

## 2020-11-21 MED ORDER — BUPIVACAINE-EPINEPHRINE 0.25% -1:200000 IJ SOLN
INTRAMUSCULAR | Status: DC | PRN
  Administered 2020-11-21: 10 mL

## 2020-11-21 MED ORDER — ACETAMINOPHEN 500 MG PO TABS
ORAL_TABLET | ORAL | Status: AC
  Filled 2020-11-21: qty 2

## 2020-11-21 MED ORDER — FENTANYL CITRATE (PF) 100 MCG/2ML IJ SOLN
INTRAMUSCULAR | Status: DC | PRN
  Administered 2020-11-21 (×2): 25 ug via INTRAVENOUS

## 2020-11-21 MED ORDER — ACETAMINOPHEN 500 MG PO TABS
1000.0000 mg | ORAL_TABLET | ORAL | Status: AC
  Administered 2020-11-21: 1000 mg via ORAL

## 2020-11-21 MED ORDER — PHENYLEPHRINE 40 MCG/ML (10ML) SYRINGE FOR IV PUSH (FOR BLOOD PRESSURE SUPPORT)
PREFILLED_SYRINGE | INTRAVENOUS | Status: AC
  Filled 2020-11-21: qty 10

## 2020-11-21 MED ORDER — CHLORHEXIDINE GLUCONATE CLOTH 2 % EX PADS: 6.0000 | MEDICATED_PAD | Freq: Once | CUTANEOUS | Status: DC

## 2020-11-21 MED ORDER — MEPERIDINE HCL 25 MG/ML IJ SOLN: 6.2500 mg | INTRAMUSCULAR | Status: DC | PRN

## 2020-11-21 MED ORDER — OXYCODONE HCL 5 MG PO TABS: 5.0000 mg | ORAL_TABLET | Freq: Once | ORAL | Status: DC | PRN

## 2020-11-21 SURGICAL SUPPLY — 50 items
APL PRP STRL LF DISP 70% ISPRP (MISCELLANEOUS) ×1
APL SKNCLS STERI-STRIP NONHPOA (GAUZE/BANDAGES/DRESSINGS) ×1
BAG DECANTER FOR FLEXI CONT (MISCELLANEOUS) ×2 IMPLANT
BENZOIN TINCTURE PRP APPL 2/3 (GAUZE/BANDAGES/DRESSINGS) ×2 IMPLANT
BLADE SURG 11 STRL SS (BLADE) ×2 IMPLANT
BLADE SURG 15 STRL LF DISP TIS (BLADE) ×1 IMPLANT
BLADE SURG 15 STRL SS (BLADE) ×2
CANISTER SUCT 1200ML W/VALVE (MISCELLANEOUS) IMPLANT
CHLORAPREP W/TINT 26 (MISCELLANEOUS) ×2 IMPLANT
CLEANER CAUTERY TIP 5X5 PAD (MISCELLANEOUS) ×1 IMPLANT
COVER BACK TABLE 60X90IN (DRAPES) ×2 IMPLANT
COVER MAYO STAND STRL (DRAPES) ×2 IMPLANT
COVER PROBE 5X48 (MISCELLANEOUS)
COVER WAND RF STERILE (DRAPES) IMPLANT
DECANTER SPIKE VIAL GLASS SM (MISCELLANEOUS) ×2 IMPLANT
DRAPE C-ARM 42X72 X-RAY (DRAPES) ×2 IMPLANT
DRAPE LAPAROTOMY TRNSV 102X78 (DRAPES) ×2 IMPLANT
DRAPE UTILITY XL STRL (DRAPES) ×2 IMPLANT
DRSG TEGADERM 4X4.75 (GAUZE/BANDAGES/DRESSINGS) ×2 IMPLANT
ELECT REM PT RETURN 9FT ADLT (ELECTROSURGICAL) ×2
ELECTRODE REM PT RTRN 9FT ADLT (ELECTROSURGICAL) ×1 IMPLANT
GAUZE SPONGE 4X4 12PLY STRL LF (GAUZE/BANDAGES/DRESSINGS) IMPLANT
GLOVE SURG ENC MOIS LTX SZ7 (GLOVE) ×2 IMPLANT
GLOVE SURG UNDER POLY LF SZ7.5 (GLOVE) ×2 IMPLANT
GOWN STRL REUS W/ TWL LRG LVL3 (GOWN DISPOSABLE) ×1 IMPLANT
GOWN STRL REUS W/TWL LRG LVL3 (GOWN DISPOSABLE) ×2
IV KIT MINILOC 20X1 SAFETY (NEEDLE) IMPLANT
KIT CVR 48X5XPRB PLUP LF (MISCELLANEOUS) IMPLANT
KIT PORT POWER 8FR ISP CVUE (Port) ×1 IMPLANT
NDL HYPO 25X1 1.5 SAFETY (NEEDLE) ×1 IMPLANT
NDL SAFETY ECLIPSE 18X1.5 (NEEDLE) IMPLANT
NDL SPNL 22GX3.5 QUINCKE BK (NEEDLE) IMPLANT
NEEDLE HYPO 18GX1.5 SHARP (NEEDLE)
NEEDLE HYPO 25X1 1.5 SAFETY (NEEDLE) ×2 IMPLANT
NEEDLE SPNL 22GX3.5 QUINCKE BK (NEEDLE) IMPLANT
PACK BASIN DAY SURGERY FS (CUSTOM PROCEDURE TRAY) ×2 IMPLANT
PAD CLEANER CAUTERY TIP 5X5 (MISCELLANEOUS) ×1
PENCIL SMOKE EVACUATOR (MISCELLANEOUS) ×2 IMPLANT
SLEEVE SCD COMPRESS KNEE MED (STOCKING) ×2 IMPLANT
SPONGE GAUZE 2X2 8PLY STRL LF (GAUZE/BANDAGES/DRESSINGS) ×2 IMPLANT
STRIP CLOSURE SKIN 1/2X4 (GAUZE/BANDAGES/DRESSINGS) ×2 IMPLANT
SUT MON AB 4-0 PC3 18 (SUTURE) ×2 IMPLANT
SUT PROLENE 2 0 CT2 30 (SUTURE) ×2 IMPLANT
SUT VIC AB 3-0 SH 27 (SUTURE) ×2
SUT VIC AB 3-0 SH 27X BRD (SUTURE) ×1 IMPLANT
SYR 5ML LUER SLIP (SYRINGE) ×2 IMPLANT
SYR CONTROL 10ML LL (SYRINGE) ×2 IMPLANT
TOWEL GREEN STERILE FF (TOWEL DISPOSABLE) ×2 IMPLANT
TUBE CONNECTING 20X1/4 (TUBING) IMPLANT
YANKAUER SUCT BULB TIP NO VENT (SUCTIONS) IMPLANT

## 2020-11-21 NOTE — Interval H&P Note (Signed)
History and Physical Interval Note:  11/21/2020 9:35 AM  Amber White  has presented today for surgery, with the diagnosis of RIGHT BREAST CANCER.  The various methods of treatment have been discussed with the patient and family. After consideration of risks, benefits and other options for treatment, the patient has consented to  Procedure(s): INSERTION PORT-A-CATH (N/A) as a surgical intervention.  The patient's history has been reviewed, patient examined, no change in status, stable for surgery.  I have reviewed the patient's chart and labs.  Questions were answered to the patient's satisfaction.     Maia Petties

## 2020-11-21 NOTE — Anesthesia Procedure Notes (Signed)
Procedure Name: LMA Insertion Date/Time: 11/21/2020 10:37 AM Performed by: Lavonia Dana, CRNA Pre-anesthesia Checklist: Patient identified, Emergency Drugs available, Suction available and Patient being monitored Patient Re-evaluated:Patient Re-evaluated prior to induction Oxygen Delivery Method: Circle system utilized Preoxygenation: Pre-oxygenation with 100% oxygen Induction Type: IV induction Ventilation: Mask ventilation without difficulty LMA: LMA inserted LMA Size: 4.0 Number of attempts: 1 Airway Equipment and Method: Bite block Placement Confirmation: positive ETCO2 Tube secured with: Tape Dental Injury: Teeth and Oropharynx as per pre-operative assessment

## 2020-11-21 NOTE — Discharge Instructions (Signed)
PORT-A-CATH: POST OP INSTRUCTIONS  Always review your discharge instruction sheet given to you by the facility where your surgery was performed.   1. A prescription for pain medication may be given to you upon discharge. Take your pain medication as prescribed, if needed. If narcotic pain medicine is not needed, then you make take acetaminophen (Tylenol) or ibuprofen (Advil) as needed.  2. Take your usually prescribed medications unless otherwise directed. 3. If you need a refill on your pain medication, please contact our office. All narcotic pain medicine now requires a paper prescription.  Phoned in and fax refills are no longer allowed by law.  Prescriptions will not be filled after 5 pm or on weekends.  4. You should follow a light diet for the remainder of the day after your procedure. 5. Most patients will experience some mild swelling and/or bruising in the area of the incision. It may take several days to resolve. 6. It is common to experience some constipation if taking pain medication after surgery. Increasing fluid intake and taking a stool softener (such as Colace) will usually help or prevent this problem from occurring. A mild laxative (Milk of Magnesia or Miralax) should be taken according to package directions if there are no bowel movements after 48 hours.  7. Unless discharge instructions indicate otherwise, you may remove your bandages 48 hours after surgery, and you may shower at that time. You may have steri-strips (small white skin tapes) in place directly over the incision.  These strips should be left on the skin for 7-10 days.  If your surgeon used Dermabond (skin glue) on the incision, you may shower in 24 hours.  The glue will flake off over the next 2-3 weeks.  8. If your port is left accessed at the end of surgery (needle left in port), the dressing cannot get wet and should only by changed by a healthcare professional. When the port is no longer accessed (when the  needle has been removed), follow step 7.   9. ACTIVITIES:  Limit activity involving your arms for the next 72 hours. Do no strenuous exercise or activity for 1 week. You may drive when you are no longer taking prescription pain medication, you can comfortably wear a seatbelt, and you can maneuver your car. 10.You may need to see your doctor in the office for a follow-up appointment.  Please       check with your doctor.  11.When you receive a new Port-a-Cath, you will get a product guide and        ID card.  Please keep them in case you need them.  WHEN TO CALL YOUR DOCTOR [phone]): 1. Fever over 101.0 2. Chills 3. Continued bleeding from incision 4. Increased redness and tenderness at the site 5. Shortness of breath, difficulty breathing   The clinic staff is available to answer your questions during regular business hours. Please don't hesitate to call and ask to speak to one of the nurses or medical assistants for clinical concerns. If you have a medical emergency, go to the nearest emergency room or call 911.  A surgeon from [facility] is always on call at the hospital.     For further information, please visit [email or web address]     No Tylenol until 3:41 pm   Post Anesthesia Home Care Instructions  Activity: Get plenty of rest for the remainder of the day. A responsible individual must stay with you for 24 hours following the procedure.  For  the next 24 hours, DO NOT: -Drive a car -Paediatric nurse -Drink alcoholic beverages -Take any medication unless instructed by your physician -Make any legal decisions or sign important papers.  Meals: Start with liquid foods such as gelatin or soup. Progress to regular foods as tolerated. Avoid greasy, spicy, heavy foods. If nausea and/or vomiting occur, drink only clear liquids until the nausea and/or vomiting subsides. Call your physician if vomiting continues.  Special Instructions/Symptoms: Your  throat may feel dry or sore from the anesthesia or the breathing tube placed in your throat during surgery. If this causes discomfort, gargle with warm salt water. The discomfort should disappear within 24 hours.  If you had a scopolamine patch placed behind your ear for the management of post- operative nausea and/or vomiting:  1. The medication in the patch is effective for 72 hours, after which it should be removed.  Wrap patch in a tissue and discard in the trash. Wash hands thoroughly with soap and water. 2. You may remove the patch earlier than 72 hours if you experience unpleasant side effects which may include dry mouth, dizziness or visual disturbances. 3. Avoid touching the patch. Wash your hands with soap and water after contact with the patch.

## 2020-11-21 NOTE — Anesthesia Postprocedure Evaluation (Signed)
Anesthesia Post Note  Patient: Amber White  Procedure(s) Performed: INSERTION PORT-A-CATH (N/A Breast)     Patient location during evaluation: PACU Anesthesia Type: General Level of consciousness: awake and alert Pain management: pain level controlled Vital Signs Assessment: post-procedure vital signs reviewed and stable Respiratory status: spontaneous breathing, nonlabored ventilation and respiratory function stable Cardiovascular status: blood pressure returned to baseline and stable Postop Assessment: no apparent nausea or vomiting Anesthetic complications: no   No complications documented.  Last Vitals:  Vitals:   11/21/20 1200 11/21/20 1255  BP: 111/67 116/78  Pulse: 86 84  Resp: 15 20  Temp:  36.5 C  SpO2: 100% 98%    Last Pain:  Vitals:   11/21/20 1200  TempSrc:   PainSc: 0-No pain                 Lynda Rainwater

## 2020-11-21 NOTE — Progress Notes (Signed)
Patient Care Team: Hague, Rosalyn Charters, MD as PCP - General (Internal Medicine) Rockwell Germany, RN as Oncology Nurse Navigator Mauro Kaufmann, RN as Oncology Nurse Navigator Donnie Mesa, MD as Consulting Physician (General Surgery) Nicholas Lose, MD as Consulting Physician (Hematology and Oncology) Gery Pray, MD as Consulting Physician (Radiation Oncology)  DIAGNOSIS:    ICD-10-CM   1. Malignant neoplasm of upper-outer quadrant of right breast in female, estrogen receptor negative ([place])  C50.411    Z17.1     SUMMARY OF ONCOLOGIC HISTORY: Oncology History  Malignant neoplasm of upper-outer quadrant of right breast in female, estrogen receptor negative ([place])  10/31/2020 Initial Diagnosis   Patient palpated a right breast mass x8mo Diagnostic mammogram and UKoreashowed an abnormality and calcifications in the right breast, 8.2cm, with skin thickening and right axillary adenopathy. 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   11/07/2020 Cancer Staging   Staging form: Breast, AJCC 8th Edition - Clinical: Stage IIIC (cT4, cN1, cM0, G3, ER-, PR-, HER2: Equivocal) - Signed by GNicholas Lose MD on 11/07/2020 Histologic grading system: 3 grade system   02/14/2021 -  Chemotherapy    Patient is on Treatment Plan: BREAST PEMBROLIZUMAB + AC Q21D X 4 CYCLES FOLLOWED BY PEMBROLIZUMAB + CARBOPLATIN D1 + PACLITAXEL D1,8,15 Q21D X 4 CYCLES        CHIEF COMPLIANT: Cycle 1 Adriamycin and Cytoxan Keytruda  INTERVAL HISTORY: EAriany Kesselmanis a 45y.o. with above-mentioned history of right breast cancer currently on neoadjuvant chemotherapy with Adriamycin and Cytoxan Keytruda. Echo on 11/19/20 showed an ejection fraction of 70-75%. CT CAP on 11/14/20 showed the known right breast and axilla malignancies, and prominent left axillary lymph nodes suggested for UK[place] with no evidence of metastatic disease. Bone scan on  11/14/20 showed no evidence of bony metastatic disease. Her port was placed by Dr. TGeorgette Doveron 11/21/20. She presents to the clinic today for cycle 1.   ALLERGIES:  is allergic to penicillins.  MEDICATIONS:  Current Outpatient Medications  Medication Sig Dispense Refill  . dexamethasone (DECADRON) 4 MG tablet Take 1 tablet day before Adriamycin and Cytoxan and 1 tablet day after with food 10 tablet 0  . HYDROcodone-acetaminophen (NORCO/VICODIN) 5-325 MG tablet Take 1 tablet by mouth every 6 (six) hours as needed for moderate pain. 15 tablet 0  . lidocaine-prilocaine (EMLA) cream Apply to affected area once 30 g 3  . ondansetron (ZOFRAN) 8 MG tablet Take 1 tablet (8 mg total) by mouth 2 (two) times daily as needed. Start on the third day after carboplatin and AC chemotherapy. 30 tablet 1  . prochlorperazine (COMPAZINE) 10 MG tablet Take 1 tablet (10 mg total) by mouth every 6 (six) hours as needed (Nausea or vomiting). 30 tablet 1   No current facility-administered medications for this visit.    PHYSICAL EXAMINATION: ECOG PERFORMANCE STATUS: 1 - Symptomatic but completely ambulatory  Vitals:   11/22/20 1141  BP: (!) 144/79  Resp: 16  Temp: 97.7 F (36.5 C)   Filed Weights   11/22/20 1141  Weight: 278 lb 12.8 oz (126.5 kg)    LABORATORY DATA:  I have reviewed the data as listed CMP Latest Ref Rng & Units 11/07/2020 02/19/2014 12/01/2009  Glucose 70 - 99 mg/dL 82 99 93  BUN 6 - 20 mg/dL 9 7 -  Creatinine 0.44 - 1.00 mg/dL 0.73 0.71 -  Sodium 135 - 145  mmol/L 141 140 -  Potassium 3.5 - 5.1 mmol/L 3.9 4.2 -  Chloride 98 - 111 mmol/L 107 105 -  CO2 22 - 32 mmol/L 24 23 -  Calcium 8.9 - 10.3 mg/dL 8.9 9.1 -  Total Protein 6.5 - 8.1 g/dL 7.7 7.6 -  Total Bilirubin 0.3 - 1.2 mg/dL 0.3 0.4 -  Alkaline Phos 38 - 126 U/L 55 56 -  AST 15 - 41 U/L 17 15 -  ALT 0 - 44 U/L 14 14 -    Lab Results  Component Value Date   WBC 9.2 11/22/2020   HGB 10.8 (L) 11/22/2020   HCT 33.4 (L) 11/22/2020    MCV 92.5 11/22/2020   PLT 317 11/22/2020   NEUTROABS 5.9 11/22/2020    ASSESSMENT & PLAN:  Malignant neoplasm of upper-outer quadrant of right breast in female, estrogen receptor negative ([place]) 10/31/20: Patient palpated a right breast mass x47mo Diagnostic mammogram and UKoreashowed an abnormality and calcifications in the right breast, 8.2cm, with skin thickening and right axillary adenopathy. 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  Treatment plan: 1. Neoadjuvant chemotherapy with Adriamycin and Cytoxan Keytruda 4 followed by Taxol weekly 12 with carboplatin and Keytruda every 3 weeks x4 followed by 1 year of torted Keytruda maintenance 2. Followed by mastectomy and axillary lymph node dissection  3. Followed by adjuvant radiation therapy UR CC nausea study CT CAP 11/14/2020: Enlarged right axillary lymph nodes, prominent left axillary lymph node.  (Requires an ultrasound) no evidence of metastatic disease in the abdomen or pelvis Bone scan 11/16/2020: No evidence of bone metastases --------------------------------------------------------------------------------------------------------------------------------------------------------- Current treatment: Cycle 1 day 1 Adriamycin Cytoxan and Keytruda Antiemetics were reviewed Chemotherapy consent obtained Chemotherapy education completed Echocardiogram 11/19/2020: EF 70-75 %  Closely monitoring for chemotherapy toxicities. Return to clinic in one week for toxicity check  No orders of the defined types were placed in this encounter.  The patient has a good understanding of the overall plan. she agrees with it. she will call with any problems that may develop before the next visit here.  Total time spent: 30 mins including face to face time and time spent for planning, charting and coordination of care  VRulon Eisenmenger MD, MPH 11/22/2020  I,  Molly Dorshimer, am acting as scribe for Dr. VNicholas Lose  I have reviewed the above documentation for accuracy and completeness, and I agree with the above.

## 2020-11-21 NOTE — Transfer of Care (Signed)
Immediate Anesthesia Transfer of Care Note  Patient: Amber White  Procedure(s) Performed: INSERTION PORT-A-CATH (N/A Breast)  Patient Location: PACU  Anesthesia Type:General  Level of Consciousness: sedated  Airway & Oxygen Therapy: Patient Spontanous Breathing and Patient connected to face mask oxygen  Post-op Assessment: Report given to RN and Post -op Vital signs reviewed and stable  Post vital signs: Reviewed and stable  Last Vitals:  Vitals Value Taken Time  BP 107/64 11/21/20 1131  Temp    Pulse 90 11/21/20 1132  Resp 9 11/21/20 1132  SpO2 100 % 11/21/20 1132  Vitals shown include unvalidated device data.  Last Pain:  Vitals:   11/21/20 0925  TempSrc: Oral  PainSc: 0-No pain      Patients Stated Pain Goal: 6 (28/20/60 1561)  Complications: No complications documented.

## 2020-11-21 NOTE — Anesthesia Preprocedure Evaluation (Signed)
Anesthesia Evaluation  Patient identified by MRN, date of birth, ID band Patient awake    Reviewed: Allergy & Precautions, NPO status , Patient's Chart, lab work & pertinent test results  Airway Mallampati: II  TM Distance: >3 FB Neck ROM: Full    Dental no notable dental hx.    Pulmonary neg pulmonary ROS,    Pulmonary exam normal breath sounds clear to auscultation       Cardiovascular negative cardio ROS Normal cardiovascular exam Rhythm:Regular Rate:Normal     Neuro/Psych negative neurological ROS  negative psych ROS   GI/Hepatic negative GI ROS, Neg liver ROS,   Endo/Other  Morbid obesity  Renal/GU negative Renal ROS  negative genitourinary   Musculoskeletal negative musculoskeletal ROS (+)   Abdominal (+) + obese,   Peds negative pediatric ROS (+)  Hematology negative hematology ROS (+)   Anesthesia Other Findings Breast Cancer  Reproductive/Obstetrics negative OB ROS                             Anesthesia Physical Anesthesia Plan  ASA: III  Anesthesia Plan: General   Post-op Pain Management:    Induction: Intravenous  PONV Risk Score and Plan: 3 and Ondansetron, Dexamethasone, Midazolam and Treatment may vary due to age or medical condition  Airway Management Planned: LMA  Additional Equipment:   Intra-op Plan:   Post-operative Plan: Extubation in OR  Informed Consent: I have reviewed the patients History and Physical, chart, labs and discussed the procedure including the risks, benefits and alternatives for the proposed anesthesia with the patient or authorized representative who has indicated his/her understanding and acceptance.     Dental advisory given  Plan Discussed with: CRNA  Anesthesia Plan Comments:         Anesthesia Quick Evaluation

## 2020-11-21 NOTE — Op Note (Signed)
Preop diagnosis: Right breast cancer Postop diagnosis: Same Procedure performed: Ultrasound guided right internal jugular vein port placement Surgeon:Synai Prettyman K Lodema Parma Anesthesia: General via LMA Indications: This is a 45 year old female with a long history of axillary hidradenitis who presents with 2 months of enlarging firmness in the right upper outer quadrant of her breast with some associated skin thickening. She initially thought that this was part of her hidradenitis. She finally sought attention for the breast mass. Imaging showed an 8.2 cm mass in the right upper outer quadrant with overlying skin thickening. She had bulky right axillary lymphadenopathy. 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 One of the lymph nodes was also biopsied and revealed metastatic carcinoma.  She is scheduled to begin neoadjuvant chemotherapy tomorrow.   Description of procedure: The patient is brought to the operating room placed in the supine position on the operating table.  After an adequate level of general anesthesia was obtained, the patient right arm was tucked at her side.  Her right chest and neck were prepped with ChloraPrep and draped sterile fashion.  A timeout was taken to ensure the proper patient and proper procedure.  She was placed in Trendelenburg position.  We interrogated her neck with the ultrasound.  The jugular vein is easily identified.  Using ultrasound guidance we directly cannulated the internal jugular vein with good blood return.  The wire passed easily.  Fluoroscopy confirmed that the wire headed down the right side of the mediastinum.  The needle was removed.  We created a subcutaneous pocket below the right clavicle.  We first anesthetized with local anesthetic.  We created a subcutaneous tunnel from the subcutaneous pocket to the insertion site on the neck.  An 8 French [place] port was  assembled and was tunneled from the subcutaneous pocket to the insertion site.  The catheter was cut to the appropriate length using fluoroscopic guidance.  Using fluoroscopic guidance, we passed the dilator and breakaway sheath over the wire.  The wire and dilator were removed.  The catheter was then advanced through the sheath which was removed.  Fluoroscopy confirmed that there were no kinks along the length of the catheter.  We are able to aspirate blood easily through the port and were able to flush easily.  The port was secured with two interrupted 2-0 Prolene sutures.  3-0 Vicryl was used to close the subcutaneous tissue and 4-0 Monocryl was used to close the skin at both sites.  Benzoin and Steri-Strips were applied.  The port was accessed and was instilled with concentrated heparin solution.  An occlusive dressing was placed over the needle and IV tubing.  The patient was then extubated and brought to the recovery room in stable condition.  All sponge, instrument, and needle counts are correct.  Post-op chest x-ray is pending.   Amber White. Amber Dover, MD, [facility]  General/ Trauma Surgery   11/21/2020 11:31 AM

## 2020-11-22 ENCOUNTER — Encounter (HOSPITAL_BASED_OUTPATIENT_CLINIC_OR_DEPARTMENT_OTHER): Payer: Self-pay | Admitting: Surgery

## 2020-11-22 ENCOUNTER — Inpatient Hospital Stay (HOSPITAL_BASED_OUTPATIENT_CLINIC_OR_DEPARTMENT_OTHER): Payer: No Typology Code available for payment source | Admitting: Hematology and Oncology

## 2020-11-22 ENCOUNTER — Encounter: Payer: Self-pay | Admitting: *Deleted

## 2020-11-22 ENCOUNTER — Encounter: Payer: Self-pay | Admitting: Hematology and Oncology

## 2020-11-22 ENCOUNTER — Telehealth: Payer: Self-pay | Admitting: Hematology and Oncology

## 2020-11-22 ENCOUNTER — Inpatient Hospital Stay: Payer: No Typology Code available for payment source

## 2020-11-22 DIAGNOSIS — Z5189 Encounter for other specified aftercare: Secondary | ICD-10-CM | POA: Diagnosis not present

## 2020-11-22 DIAGNOSIS — Z95828 Presence of other vascular implants and grafts: Secondary | ICD-10-CM

## 2020-11-22 DIAGNOSIS — C50411 Malignant neoplasm of upper-outer quadrant of right female breast: Secondary | ICD-10-CM

## 2020-11-22 DIAGNOSIS — Z5111 Encounter for antineoplastic chemotherapy: Secondary | ICD-10-CM | POA: Diagnosis present

## 2020-11-22 DIAGNOSIS — C773 Secondary and unspecified malignant neoplasm of axilla and upper limb lymph nodes: Secondary | ICD-10-CM | POA: Diagnosis not present

## 2020-11-22 DIAGNOSIS — Z171 Estrogen receptor negative status [ER-]: Secondary | ICD-10-CM

## 2020-11-22 DIAGNOSIS — Z5112 Encounter for antineoplastic immunotherapy: Secondary | ICD-10-CM | POA: Diagnosis present

## 2020-11-22 DIAGNOSIS — Z79899 Other long term (current) drug therapy: Secondary | ICD-10-CM | POA: Diagnosis not present

## 2020-11-22 HISTORY — DX: Presence of other vascular implants and grafts: Z95.828

## 2020-11-22 LAB — COMPREHENSIVE METABOLIC PANEL
ALT: 11 U/L (ref 0–44)
AST: 13 U/L — ABNORMAL LOW (ref 15–41)
Albumin: 3.3 g/dL — ABNORMAL LOW (ref 3.5–5.0)
Alkaline Phosphatase: 67 U/L (ref 38–126)
Anion gap: 8 (ref 5–15)
BUN: 11 mg/dL (ref 6–20)
CO2: 23 mmol/L (ref 22–32)
Calcium: 9 mg/dL (ref 8.9–10.3)
Chloride: 110 mmol/L (ref 98–111)
Creatinine, Ser: 0.83 mg/dL (ref 0.44–1.00)
GFR, Estimated: >60 mL/min (ref >60)
Glucose, Bld: 103 mg/dL — ABNORMAL HIGH (ref 70–99)
Potassium: 3.3 mmol/L — ABNORMAL LOW (ref 3.5–5.1)
Sodium: 141 mmol/L (ref 135–145)
Total Bilirubin: 0.2 mg/dL — ABNORMAL LOW (ref 0.3–1.2)
Total Protein: 7.6 g/dL (ref 6.5–8.1)

## 2020-11-22 LAB — CBC WITH DIFFERENTIAL/PLATELET
Abs Immature Granulocytes: 0.03 10*3/uL (ref 0.00–0.07)
Basophils Absolute: 0 10*3/uL (ref 0.0–0.1)
Basophils Relative: 0 %
Eosinophils Absolute: 0 10*3/uL (ref 0.0–0.5)
Eosinophils Relative: 0 %
HCT: 33.4 % — ABNORMAL LOW (ref 36.0–46.0)
Hemoglobin: 10.8 g/dL — ABNORMAL LOW (ref 12.0–15.0)
Immature Granulocytes: 0 %
Lymphocytes Relative: 30 %
Lymphs Abs: 2.7 10*3/uL (ref 0.7–4.0)
MCH: 29.9 pg (ref 26.0–34.0)
MCHC: 32.3 g/dL (ref 30.0–36.0)
MCV: 92.5 fL (ref 80.0–100.0)
Monocytes Absolute: 0.6 10*3/uL (ref 0.1–1.0)
Monocytes Relative: 6 %
Neutro Abs: 5.9 10*3/uL (ref 1.7–7.7)
Neutrophils Relative %: 64 %
Platelets: 317 10*3/uL (ref 150–400)
RBC: 3.61 MIL/uL — ABNORMAL LOW (ref 3.87–5.11)
RDW: 13.6 % (ref 11.5–15.5)
WBC: 9.2 10*3/uL (ref 4.0–10.5)
nRBC: 0 % (ref 0.0–0.2)

## 2020-11-22 LAB — TSH: TSH: 0.417 u[IU]/mL (ref 0.308–3.960)

## 2020-11-22 MED ORDER — SODIUM CHLORIDE 0.9 % IV SOLN
10.0000 mg | Freq: Once | INTRAVENOUS | Status: AC
  Administered 2020-11-22: 10 mg via INTRAVENOUS
  Filled 2020-11-22: qty 10

## 2020-11-22 MED ORDER — SODIUM CHLORIDE 0.9% FLUSH
10.0000 mL | INTRAVENOUS | Status: DC | PRN
  Administered 2020-11-22: 10 mL
  Filled 2020-11-22: qty 10

## 2020-11-22 MED ORDER — DOXORUBICIN HCL CHEMO IV INJECTION 2 MG/ML
60.0000 mg/m2 | Freq: Once | INTRAVENOUS | Status: AC
  Administered 2020-11-22: 144 mg via INTRAVENOUS
  Filled 2020-11-22: qty 72

## 2020-11-22 MED ORDER — HEPARIN SOD (PORK) LOCK FLUSH 100 UNIT/ML IV SOLN
500.0000 [IU] | Freq: Once | INTRAVENOUS | Status: AC | PRN
  Administered 2020-11-22: 500 [IU]
  Filled 2020-11-22: qty 5

## 2020-11-22 MED ORDER — SODIUM CHLORIDE 0.9 % IV SOLN
600.0000 mg/m2 | Freq: Once | INTRAVENOUS | Status: AC
  Administered 2020-11-22: 1440 mg via INTRAVENOUS
  Filled 2020-11-22: qty 72

## 2020-11-22 MED ORDER — PALONOSETRON HCL INJECTION 0.25 MG/5ML
0.2500 mg | Freq: Once | INTRAVENOUS | Status: AC
  Administered 2020-11-22: 0.25 mg via INTRAVENOUS

## 2020-11-22 MED ORDER — SODIUM CHLORIDE 0.9 % IV SOLN
Freq: Once | INTRAVENOUS | Status: AC
Start: 2020-11-22 — End: 2020-11-22
  Filled 2020-11-22: qty 250

## 2020-11-22 MED ORDER — SODIUM CHLORIDE 0.9 % IV SOLN
150.0000 mg | Freq: Once | INTRAVENOUS | Status: AC
  Administered 2020-11-22: 150 mg via INTRAVENOUS
  Filled 2020-11-22: qty 150

## 2020-11-22 MED ORDER — PALONOSETRON HCL INJECTION 0.25 MG/5ML
INTRAVENOUS | Status: AC
  Filled 2020-11-22: qty 5

## 2020-11-22 MED ORDER — SODIUM CHLORIDE 0.9% FLUSH
10.0000 mL | Freq: Once | INTRAVENOUS | Status: AC
  Administered 2020-11-22: 10 mL
  Filled 2020-11-22: qty 10

## 2020-11-22 MED ORDER — SODIUM CHLORIDE 0.9 % IV SOLN
200.0000 mg | Freq: Once | INTRAVENOUS | Status: AC
  Administered 2020-11-22: 200 mg via INTRAVENOUS
  Filled 2020-11-22: qty 8

## 2020-11-22 NOTE — Addendum Note (Signed)
Addended by: Nicholas Lose on: 11/22/2020 01:08 PM   Modules accepted: Orders

## 2020-11-22 NOTE — Patient Instructions (Signed)
[facility]  Discharge Instructions: Thank you for choosing [facility] to provide your oncology and hematology care.   If you have a lab appointment with the [facility], please go directly to the [facility] and check in at the registration area.   Wear comfortable clothing and clothing appropriate for easy access to any Portacath or PICC line.   We strive to give you quality time with your provider. You may need to reschedule your appointment if you arrive late (15 or more minutes).  Arriving late affects you and other patients whose appointments are after yours.  Also, if you miss three or more appointments without notifying the office, you may be dismissed from the clinic at the provider's discretion.      For prescription refill requests, have your pharmacy contact our office and allow 72 hours for refills to be completed.    Today you received the following chemotherapy and/or immunotherapy agents: keytruda, doxorubicin, cyclophosphamide      To help prevent nausea and vomiting after your treatment, we encourage you to take your nausea medication as directed.  BELOW ARE SYMPTOMS THAT SHOULD BE REPORTED IMMEDIATELY: . *FEVER GREATER THAN 100.4 F (38 C) OR HIGHER . *CHILLS OR SWEATING . *NAUSEA AND VOMITING THAT IS NOT CONTROLLED WITH YOUR NAUSEA MEDICATION . *UNUSUAL SHORTNESS OF BREATH . *UNUSUAL BRUISING OR BLEEDING . *URINARY PROBLEMS (pain or burning when urinating, or frequent urination) . *BOWEL PROBLEMS (unusual diarrhea, constipation, pain near the anus) . TENDERNESS IN MOUTH AND THROAT WITH OR WITHOUT PRESENCE OF ULCERS (sore throat, sores in mouth, or a toothache) . UNUSUAL RASH, SWELLING OR PAIN  . UNUSUAL VAGINAL DISCHARGE OR ITCHING   Items with * indicate a potential emergency and should be followed up as soon as possible or go to the Emergency Department if any problems should occur.  Please show the CHEMOTHERAPY  ALERT CARD or IMMUNOTHERAPY ALERT CARD at check-in to the Emergency Department and triage nurse.  Should you have questions after your visit or need to cancel or reschedule your appointment, please contact [facility]  Dept: [phone]  and follow the prompts.  Office hours are 8:00 a.m. to 4:30 p.m. Monday - Friday. Please note that voicemails left after 4:00 p.m. may not be returned until the following business day.  We are closed weekends and major holidays. You have access to a nurse at all times for urgent questions. Please call the main number to the clinic Dept: [phone] and follow the prompts.   For any non-urgent questions, you may also contact your provider using MyChart. We now offer e-Visits for anyone 9 and older to request care online for non-urgent symptoms. For details visit [email or web address].   Also download the MyChart app! Go to the app store, search "MyChart", open the app, select [facility], and log in with your MyChart username and password.  Due to Covid, a mask is required upon entering the hospital/clinic. If you do not have a mask, one will be given to you upon arrival. For doctor visits, patients may have 1 support person aged 37 or older with them. For treatment visits, patients cannot have anyone with them due to current Covid guidelines and our immunocompromised population.   Pembrolizumab injection What is this medicine? PEMBROLIZUMAB (pem broe liz ue mab) is a monoclonal antibody. It is used to treat certain types of cancer. This medicine may be used for other purposes; ask your health care  provider or pharmacist if you have questions. COMMON BRAND NAME(S): Keytruda What should I tell my health care provider before I take this medicine? They need to know if you have any of these conditions:  autoimmune diseases like Crohn's disease, ulcerative colitis, or lupus  have had or planning to have an allogeneic stem cell  transplant (uses someone else's stem cells)  history of organ transplant  history of chest radiation  nervous system problems like myasthenia gravis or Guillain-Barre syndrome  an unusual or allergic reaction to pembrolizumab, other medicines, foods, dyes, or preservatives  pregnant or trying to get pregnant  breast-feeding How should I use this medicine? This medicine is for infusion into a vein. It is given by a health care professional in a hospital or clinic setting. A special MedGuide will be given to you before each treatment. Be sure to read this information carefully each time. Talk to your pediatrician regarding the use of this medicine in children. While this drug may be prescribed for children as young as 6 months for selected conditions, precautions do apply. Overdosage: If you think you have taken too much of this medicine contact a poison control center or emergency room at once. NOTE: This medicine is only for you. Do not share this medicine with others. What if I miss a dose? It is important not to miss your dose. Call your doctor or health care professional if you are unable to keep an appointment. What may interact with this medicine? Interactions have not been studied. This list may not describe all possible interactions. Give your health care provider a list of all the medicines, herbs, non-prescription drugs, or dietary supplements you use. Also tell them if you smoke, drink alcohol, or use illegal drugs. Some items may interact with your medicine. What should I watch for while using this medicine? Your condition will be monitored carefully while you are receiving this medicine. You may need blood work done while you are taking this medicine. Do not become pregnant while taking this medicine or for 4 months after stopping it. Women should inform their doctor if they wish to become pregnant or think they might be pregnant. There is a potential for serious side effects  to an unborn child. Talk to your health care professional or pharmacist for more information. Do not breast-feed an infant while taking this medicine or for 4 months after the last dose. What side effects may I notice from receiving this medicine? Side effects that you should report to your doctor or health care professional as soon as possible:  allergic reactions like skin rash, itching or hives, swelling of the face, lips, or tongue  bloody or black, tarry  breathing problems  changes in vision  chest pain  chills  confusion  constipation  cough  diarrhea  dizziness or feeling faint or lightheaded  fast or irregular heartbeat  fever  flushing  joint pain  low blood counts - this medicine may decrease the number of white blood cells, red blood cells and platelets. You may be at increased risk for infections and bleeding.  muscle pain  muscle weakness  pain, tingling, numbness in the hands or feet  persistent headache  redness, blistering, peeling or loosening of the skin, including inside the mouth  signs and symptoms of high blood sugar such as dizziness; dry mouth; dry skin; fruity breath; nausea; stomach pain; increased hunger or thirst; increased urination  signs and symptoms of kidney injury like trouble passing urine or change  in the amount of urine  signs and symptoms of liver injury like dark urine, light-colored stools, loss of appetite, nausea, right upper belly pain, yellowing of the eyes or skin  sweating  swollen lymph nodes  weight loss Side effects that usually do not require medical attention (report to your doctor or health care professional if they continue or are bothersome):  decreased appetite  hair loss  tiredness This list may not describe all possible side effects. Call your doctor for medical advice about side effects. You may report side effects to FDA at 1-800-FDA-1088. Where should I keep my medicine? This drug is given  in a hospital or clinic and will not be stored at home. NOTE: This sheet is a summary. It may not cover all possible information. If you have questions about this medicine, talk to your doctor, pharmacist, or health care provider.  2021 Elsevier/Gold Standard (2019-06-01 21:44:53)  Doxorubicin injection What is this medicine? DOXORUBICIN (dox oh ROO bi sin) is a chemotherapy drug. It is used to treat many kinds of cancer like leukemia, lymphoma, neuroblastoma, sarcoma, and Wilms' tumor. It is also used to treat bladder cancer, breast cancer, lung cancer, ovarian cancer, stomach cancer, and thyroid cancer. This medicine may be used for other purposes; ask your health care provider or pharmacist if you have questions. COMMON BRAND NAME(S): Adriamycin, Adriamycin PFS, Adriamycin RDF, Rubex What should I tell my health care provider before I take this medicine? They need to know if you have any of these conditions:  heart disease  history of low blood counts caused by a medicine  liver disease  recent or ongoing radiation therapy  an unusual or allergic reaction to doxorubicin, other chemotherapy agents, other medicines, foods, dyes, or preservatives  pregnant or trying to get pregnant  breast-feeding How should I use this medicine? This drug is given as an infusion into a vein. It is administered in a hospital or clinic by a specially trained health care professional. If you have pain, swelling, burning or any unusual feeling around the site of your injection, tell your health care professional right away. Talk to your pediatrician regarding the use of this medicine in children. Special care may be needed. Overdosage: If you think you have taken too much of this medicine contact a poison control center or emergency room at once. NOTE: This medicine is only for you. Do not share this medicine with others. What if I miss a dose? It is important not to miss your dose. Call your doctor or  health care professional if you are unable to keep an appointment. What may interact with this medicine? This medicine may interact with the following medications:  6-mercaptopurine  paclitaxel  phenytoin  St. John's Wort  trastuzumab  verapamil This list may not describe all possible interactions. Give your health care provider a list of all the medicines, herbs, non-prescription drugs, or dietary supplements you use. Also tell them if you smoke, drink alcohol, or use illegal drugs. Some items may interact with your medicine. What should I watch for while using this medicine? This drug may make you feel generally unwell. This is not uncommon, as chemotherapy can affect healthy cells as well as cancer cells. Report any side effects. Continue your course of treatment even though you feel ill unless your doctor tells you to stop. There is a maximum amount of this medicine you should receive throughout your life. The amount depends on the medical condition being treated and your overall health.  Your doctor will watch how much of this medicine you receive in your lifetime. Tell your doctor if you have taken this medicine before. You may need blood work done while you are taking this medicine. Your urine may turn red for a few days after your dose. This is not blood. If your urine is dark or brown, call your doctor. In some cases, you may be given additional medicines to help with side effects. Follow all directions for their use. Call your doctor or health care professional for advice if you get a fever, chills or sore throat, or other symptoms of a cold or flu. Do not treat yourself. This drug decreases your body's ability to fight infections. Try to avoid being around people who are sick. This medicine may increase your risk to bruise or bleed. Call your doctor or health care professional if you notice any unusual bleeding. Talk to your doctor about your risk of cancer. You may be more at risk  for certain types of cancers if you take this medicine. Do not become pregnant while taking this medicine or for 6 months after stopping it. Women should inform their doctor if they wish to become pregnant or think they might be pregnant. Men should not father a child while taking this medicine and for 6 months after stopping it. There is a potential for serious side effects to an unborn child. Talk to your health care professional or pharmacist for more information. Do not breast-feed an infant while taking this medicine. This medicine has caused ovarian failure in some women and reduced sperm counts in some men This medicine may interfere with the ability to have a child. Talk with your doctor or health care professional if you are concerned about your fertility. This medicine may cause a decrease in Co-Enzyme Q-10. You should make sure that you get enough Co-Enzyme Q-10 while you are taking this medicine. Discuss the foods you eat and the vitamins you take with your health care professional. What side effects may I notice from receiving this medicine? Side effects that you should report to your doctor or health care professional as soon as possible:  allergic reactions like skin rash, itching or hives, swelling of the face, lips, or tongue  breathing problems  chest pain  fast or irregular heartbeat  low blood counts - this medicine may decrease the number of white blood cells, red blood cells and platelets. You may be at increased risk for infections and bleeding.  pain, redness, or irritation at site where injected  signs of infection - fever or chills, cough, sore throat, pain or difficulty passing urine  signs of decreased platelets or bleeding - bruising, pinpoint red spots on the skin, black, tarry stools, blood in the urine  swelling of the ankles, feet, hands  tiredness  weakness Side effects that usually do not require medical attention (report to your doctor or health care  professional if they continue or are bothersome):  diarrhea  hair loss  mouth sores  nail discoloration or damage  nausea  red colored urine  vomiting This list may not describe all possible side effects. Call your doctor for medical advice about side effects. You may report side effects to FDA at 1-800-FDA-1088. Where should I keep my medicine? This drug is given in a hospital or clinic and will not be stored at home. NOTE: This sheet is a summary. It may not cover all possible information. If you have questions about this medicine, talk to  your doctor, pharmacist, or health care provider.  2021 Elsevier/Gold Standard (2017-02-11 11:01:26)  Cyclophosphamide Injection What is this medicine? CYCLOPHOSPHAMIDE (sye kloe FOSS fa mide) is a chemotherapy drug. It slows the growth of cancer cells. This medicine is used to treat many types of cancer like lymphoma, myeloma, leukemia, breast cancer, and ovarian cancer, to name a few. This medicine may be used for other purposes; ask your health care provider or pharmacist if you have questions. COMMON BRAND NAME(S): Cytoxan, Neosar What should I tell my health care provider before I take this medicine? They need to know if you have any of these conditions:  heart disease  history of irregular heartbeat  infection  kidney disease  liver disease  low blood counts, like white cells, platelets, or red blood cells  on hemodialysis  recent or ongoing radiation therapy  scarring or thickening of the lungs  trouble passing urine  an unusual or allergic reaction to cyclophosphamide, other medicines, foods, dyes, or preservatives  pregnant or trying to get pregnant  breast-feeding How should I use this medicine? This drug is usually given as an injection into a vein or muscle or by infusion into a vein. It is administered in a hospital or clinic by a specially trained health care professional. Talk to your pediatrician regarding  the use of this medicine in children. Special care may be needed. Overdosage: If you think you have taken too much of this medicine contact a poison control center or emergency room at once. NOTE: This medicine is only for you. Do not share this medicine with others. What if I miss a dose? It is important not to miss your dose. Call your doctor or health care professional if you are unable to keep an appointment. What may interact with this medicine?  amphotericin B  azathioprine  certain antivirals for HIV or hepatitis  certain medicines for blood pressure, heart disease, irregular heart beat  certain medicines that treat or prevent blood clots like warfarin  certain other medicines for cancer  cyclosporine  etanercept  indomethacin  medicines that relax muscles for surgery  medicines to increase blood counts  metronidazole This list may not describe all possible interactions. Give your health care provider a list of all the medicines, herbs, non-prescription drugs, or dietary supplements you use. Also tell them if you smoke, drink alcohol, or use illegal drugs. Some items may interact with your medicine. What should I watch for while using this medicine? Your condition will be monitored carefully while you are receiving this medicine. You may need blood work done while you are taking this medicine. Drink water or other fluids as directed. Urinate often, even at night. Some products may contain alcohol. Ask your health care professional if this medicine contains alcohol. Be sure to tell all health care professionals you are taking this medicine. Certain medicines, like metronidazole and disulfiram, can cause an unpleasant reaction when taken with alcohol. The reaction includes flushing, headache, nausea, vomiting, sweating, and increased thirst. The reaction can last from 30 minutes to several hours. Do not become pregnant while taking this medicine or for 1 year after stopping  it. Women should inform their health care professional if they wish to become pregnant or think they might be pregnant. Men should not father a child while taking this medicine and for 4 months after stopping it. There is potential for serious side effects to an unborn child. Talk to your health care professional for more information. Do not breast-feed an  infant while taking this medicine or for 1 week after stopping it. This medicine has caused ovarian failure in some women. This medicine may make it more difficult to get pregnant. Talk to your health care professional if you are concerned about your fertility. This medicine has caused decreased sperm counts in some men. This may make it more difficult to father a child. Talk to your health care professional if you are concerned about your fertility. Call your health care professional for advice if you get a fever, chills, or sore throat, or other symptoms of a cold or flu. Do not treat yourself. This medicine decreases your body's ability to fight infections. Try to avoid being around people who are sick. Avoid taking medicines that contain aspirin, acetaminophen, ibuprofen, naproxen, or ketoprofen unless instructed by your health care professional. These medicines may hide a fever. Talk to your health care professional about your risk of cancer. You may be more at risk for certain types of cancer if you take this medicine. If you are going to need surgery or other procedure, tell your health care professional that you are using this medicine. Be careful brushing or flossing your teeth or using a toothpick because you may get an infection or bleed more easily. If you have any dental work done, tell your dentist you are receiving this medicine. What side effects may I notice from receiving this medicine? Side effects that you should report to your doctor or health care professional as soon as possible:  allergic reactions like skin rash, itching or  hives, swelling of the face, lips, or tongue  breathing problems  nausea, vomiting  signs and symptoms of bleeding such as bloody or black, tarry stools; red or dark brown urine; spitting up blood or brown material that looks like coffee grounds; red spots on the skin; unusual bruising or bleeding from the eyes, gums, or nose  signs and symptoms of heart failure like fast, irregular heartbeat, sudden weight gain; swelling of the ankles, feet, hands  signs and symptoms of infection like fever; chills; cough; sore throat; pain or trouble passing urine  signs and symptoms of kidney injury like trouble passing urine or change in the amount of urine  signs and symptoms of liver injury like dark yellow or brown urine; general ill feeling or flu-like symptoms; light-colored stools; loss of appetite; nausea; right upper belly pain; unusually weak or tired; yellowing of the eyes or skin Side effects that usually do not require medical attention (report to your doctor or health care professional if they continue or are bothersome):  confusion  decreased hearing  diarrhea  facial flushing  hair loss  headache  loss of appetite  missed menstrual periods  signs and symptoms of low red blood cells or anemia such as unusually weak or tired; feeling faint or lightheaded; falls  skin discoloration This list may not describe all possible side effects. Call your doctor for medical advice about side effects. You may report side effects to FDA at 1-800-FDA-1088. Where should I keep my medicine? This drug is given in a hospital or clinic and will not be stored at home. NOTE: This sheet is a summary. It may not cover all possible information. If you have questions about this medicine, talk to your doctor, pharmacist, or health care provider.  2021 Elsevier/Gold Standard (2019-04-04 09:53:29)

## 2020-11-22 NOTE — Progress Notes (Signed)
Met with patient at registration to complete grant process.  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 Discussed in detail expenses and how they are covered. She has a copy of the approval letter and expense sheet along with the Outpatient pharmacy information. She received a gift card today from her grant.  She has my card for any additional financial questions or concerns.

## 2020-11-22 NOTE — Telephone Encounter (Signed)
Rescheduled upcoming appointments per patient's request. Patient is aware of changes. 

## 2020-11-22 NOTE — Assessment & Plan Note (Signed)
10/31/20: Patient palpated a right breast mass x64mo Diagnostic mammogram and [place] [place] abnormality and calcifications in the right breast, 8.2cm, with skin thickening and right axillary adenopathy. 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  Treatment plan: 1. Neoadjuvant chemotherapy with Adriamycin and Cytoxan Keytruda 4 followed by Taxol weekly 12 with carboplatin and Keytruda every 3 weeks x4 followed by 1 year of torted Keytruda maintenance 2. Followed by mastectomy and axillary lymph node dissection  3. Followed by adjuvant radiation therapy [facility] CC nausea study --------------------------------------------------------------------------------------------------------------------------------------------------------- Current treatment: Cycle 1 day 1 Adriamycin Cytoxan and Keytruda Antiemetics were reviewed Chemotherapy consent obtained Chemotherapy education completed Echocardiogram 11/19/2020: EF 70-75 %  Closely monitoring for chemotherapy toxicities. Return to clinic in one week for toxicity check

## 2020-11-23 ENCOUNTER — Other Ambulatory Visit: Payer: No Typology Code available for payment source

## 2020-11-23 ENCOUNTER — Telehealth: Payer: Self-pay | Admitting: *Deleted

## 2020-11-23 ENCOUNTER — Other Ambulatory Visit: Payer: Self-pay | Admitting: Surgery

## 2020-11-23 DIAGNOSIS — Z171 Estrogen receptor negative status [ER-]: Secondary | ICD-10-CM

## 2020-11-23 DIAGNOSIS — C50411 Malignant neoplasm of upper-outer quadrant of right female breast: Secondary | ICD-10-CM

## 2020-11-23 LAB — T4: T4, Total: 8.1 ug/dL (ref 4.5–12.0)

## 2020-11-23 NOTE — Telephone Encounter (Signed)
Received message fro [facility] that pt does not wish to have left axillary [place] and possible bx done at this time and will call back when she's ready to schedule.  Dr. Lindi Adie notified.

## 2020-11-24 ENCOUNTER — Other Ambulatory Visit: Payer: Self-pay

## 2020-11-24 ENCOUNTER — Inpatient Hospital Stay: Payer: No Typology Code available for payment source

## 2020-11-24 VITALS — BP 133/82 | HR 96 | Temp 97.2°F | Resp 22

## 2020-11-24 DIAGNOSIS — Z5112 Encounter for antineoplastic immunotherapy: Secondary | ICD-10-CM | POA: Diagnosis not present

## 2020-11-24 DIAGNOSIS — C50411 Malignant neoplasm of upper-outer quadrant of right female breast: Secondary | ICD-10-CM

## 2020-11-24 DIAGNOSIS — Z171 Estrogen receptor negative status [ER-]: Secondary | ICD-10-CM

## 2020-11-24 MED ORDER — PEGFILGRASTIM-BMEZ 6 MG/0.6ML ~~LOC~~ SOSY
6.0000 mg | PREFILLED_SYRINGE | Freq: Once | SUBCUTANEOUS | Status: AC
  Administered 2020-11-24: 6 mg via SUBCUTANEOUS

## 2020-11-24 NOTE — Patient Instructions (Signed)
Pegfilgrastim injection What is this medicine? PEGFILGRASTIM (PEG fil gra stim) is a long-acting granulocyte colony-stimulating factor that stimulates the growth of neutrophils, a type of white blood cell important in the body's fight against infection. It is used to reduce the incidence of fever and infection in patients with certain types of cancer who are receiving chemotherapy that affects the bone marrow, and to increase survival after being exposed to high doses of radiation. This medicine may be used for other purposes; ask your health care provider or pharmacist if you have questions. COMMON BRAND NAME(S): Fulphila, Neulasta, Nyvepria, UDENYCA, Ziextenzo What should I tell my health care provider before I take this medicine? They need to know if you have any of these conditions:  kidney disease  latex allergy  ongoing radiation therapy  sickle cell disease  skin reactions to acrylic adhesives (On-Body Injector only)  an unusual or allergic reaction to pegfilgrastim, filgrastim, other medicines, foods, dyes, or preservatives  pregnant or trying to get pregnant  breast-feeding How should I use this medicine? This medicine is for injection under the skin. If you get this medicine at home, you will be taught how to prepare and give the pre-filled syringe or how to use the On-body Injector. Refer to the patient Instructions for Use for detailed instructions. Use exactly as directed. Tell your healthcare provider immediately if you suspect that the On-body Injector may not have performed as intended or if you suspect the use of the On-body Injector resulted in a missed or partial dose. It is important that you put your used needles and syringes in a special sharps container. Do not put them in a trash can. If you do not have a sharps container, call your pharmacist or healthcare provider to get one. Talk to your pediatrician regarding the use of this medicine in children. While this drug  may be prescribed for selected conditions, precautions do apply. Overdosage: If you think you have taken too much of this medicine contact a poison control center or emergency room at once. NOTE: This medicine is only for you. Do not share this medicine with others. What if I miss a dose? It is important not to miss your dose. Call your doctor or health care professional if you miss your dose. If you miss a dose due to an On-body Injector failure or leakage, a new dose should be administered as soon as possible using a single prefilled syringe for manual use. What may interact with this medicine? Interactions have not been studied. This list may not describe all possible interactions. Give your health care provider a list of all the medicines, herbs, non-prescription drugs, or dietary supplements you use. Also tell them if you smoke, drink alcohol, or use illegal drugs. Some items may interact with your medicine. What should I watch for while using this medicine? Your condition will be monitored carefully while you are receiving this medicine. You may need blood work done while you are taking this medicine. Talk to your health care provider about your risk of cancer. You may be more at risk for certain types of cancer if you take this medicine. If you are going to need a MRI, CT scan, or other procedure, tell your doctor that you are using this medicine (On-Body Injector only). What side effects may I notice from receiving this medicine? Side effects that you should report to your doctor or health care professional as soon as possible:  allergic reactions (skin rash, itching or hives, swelling of   the face, lips, or tongue)  back pain  dizziness  fever  pain, redness, or irritation at site where injected  pinpoint red spots on the skin  red or dark-brown urine  shortness of breath or breathing problems  stomach or side pain, or pain at the shoulder  swelling  tiredness  trouble  passing urine or change in the amount of urine  unusual bruising or bleeding Side effects that usually do not require medical attention (report to your doctor or health care professional if they continue or are bothersome):  bone pain  muscle pain This list may not describe all possible side effects. Call your doctor for medical advice about side effects. You may report side effects to FDA at 1-800-FDA-1088. Where should I keep my medicine? Keep out of the reach of children. If you are using this medicine at home, you will be instructed on how to store it. Throw away any unused medicine after the expiration date on the label. NOTE: This sheet is a summary. It may not cover all possible information. If you have questions about this medicine, talk to your doctor, pharmacist, or health care provider.  2021 Elsevier/Gold Standard (2019-07-22 13:20:51)  

## 2020-11-26 ENCOUNTER — Encounter: Payer: Self-pay | Admitting: *Deleted

## 2020-11-26 ENCOUNTER — Telehealth: Payer: Self-pay | Admitting: *Deleted

## 2020-11-26 NOTE — Telephone Encounter (Signed)
Attempted to reach pt to discuss how she did with her treatment last week.  Message received that VM not set up yet.

## 2020-11-26 NOTE — Telephone Encounter (Signed)
-----   Message from Manuella Ghazi, RN sent at 11/22/2020  4:58 PM EDT ----- Regarding: Amber White- First time follow up Please follow up with patient. She received keytruda/AC for first time 11/22/20

## 2020-11-27 ENCOUNTER — Encounter (HOSPITAL_BASED_OUTPATIENT_CLINIC_OR_DEPARTMENT_OTHER): Payer: Self-pay | Admitting: Surgery

## 2020-11-28 ENCOUNTER — Encounter: Payer: Self-pay | Admitting: Licensed Clinical Social Worker

## 2020-11-28 ENCOUNTER — Telehealth: Payer: Self-pay | Admitting: Licensed Clinical Social Worker

## 2020-11-28 ENCOUNTER — Ambulatory Visit: Payer: Self-pay | Admitting: Licensed Clinical Social Worker

## 2020-11-28 DIAGNOSIS — Z1379 Encounter for other screening for genetic and chromosomal anomalies: Secondary | ICD-10-CM | POA: Insufficient documentation

## 2020-11-28 DIAGNOSIS — Z171 Estrogen receptor negative status [ER-]: Secondary | ICD-10-CM

## 2020-11-28 DIAGNOSIS — Z803 Family history of malignant neoplasm of breast: Secondary | ICD-10-CM

## 2020-11-28 DIAGNOSIS — C50411 Malignant neoplasm of upper-outer quadrant of right female breast: Secondary | ICD-10-CM

## 2020-11-28 NOTE — Progress Notes (Signed)
HPI:  Ms. Blackstock was previously seen in the [facility] due to a personal and family history of breast cancer and concerns regarding a hereditary predisposition to cancer. Please refer to our prior cancer genetics clinic note for more information regarding our discussion, assessment and recommendations, at the time. Ms. Cullen recent genetic test results were disclosed to her, as were recommendations warranted by these results. These results and recommendations are discussed in more detail below.  CANCER HISTORY:  Oncology History  Malignant neoplasm of upper-outer quadrant of right breast in female, estrogen receptor negative ([place])  10/31/2020 Initial Diagnosis   Patient palpated a right breast mass x42mo Diagnostic mammogram and UKoreashowed an abnormality and calcifications in the right breast, 8.2cm, with skin thickening and right axillary adenopathy. 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   11/07/2020 Cancer Staging   Staging form: Breast, AJCC 8th Edition - Clinical: Stage IIIC (cT4, cN1, cM0, G3, ER-, PR-, HER2: Equivocal) - Signed by GNicholas Lose MD on 11/07/2020 Histologic grading system: 3 grade system   02/14/2021 -  Chemotherapy    Patient is on Treatment Plan: BREAST PEMBROLIZUMAB + AC Q21D X 4 CYCLES FOLLOWED BY PEMBROLIZUMAB + CARBOPLATIN D1 + PACLITAXEL D1,8,15 Q21D X 4 CYCLES       Genetic Testing   Negative genetic testing. No pathogenic variants identified on the [facility]. The report date is 11/28/2020.  The CancerNext-Expanded + RNAinsight gene panel offered by [employer] includes sequencing and rearrangement analysis for the following 77 genes: IP, ALK, APC*, ATM*, AXIN2, BAP1, BARD1, BLM, BMPR1A, BRCA1*, BRCA2*, BRIP1*, CDC73, CDH1*,CDK4, CDKN1B, CDKN2A, CHEK2*, CTNNA1, DICER1, FANCC, FH, FLCN, GALNT12, KIF1B, LZTR1, MAX,  MEN1, MET, MLH1*, MSH2*, MSH3, MSH6*, MUTYH*, NBN, NF1*, NF2, NTHL1, PALB2*, PHOX2B, PMS2*, POT1, PRKAR1A, PTCH1, PTEN*, RAD51C*, RAD51D*,RB1, RECQL, RET, SDHA, SDHAF2, SDHB, SDHC, SDHD, SMAD4, SMARCA4, SMARCB1, SMARCE1, STK11, SUFU, TMEM127, TP53*,TSC1, TSC2, VHL and XRCC2 (sequencing and deletion/duplication); EGFR, EGLN1, HOXB13, KIT, MITF, PDGFRA, POLD1 and POLE (sequencing only); EPCAM and GREM1 (deletion/duplication only).     FAMILY HISTORY:  We obtained a detailed, 4-generation family history.  Significant diagnoses are listed below: Family History  Problem Relation Age of Onset  . Breast cancer Mother   . Diabetes Maternal Uncle   . Diabetes Maternal Grandmother   . Heart disease Maternal Grandmother      Ms. JHarahas 4 daughters. She has 1 maternal half brother, 1 maternal half sister.  Ms. JHatterymother is living at 65and had breast cancer at 89 Patient has 5 maternal uncles, 2 maternal aunts, no cancers. Maternal grandmother passed at 786 unknown age of death for grandfather.  Ms. JWysshas no information about her paternal side.   Ms. JFlaneryis unaware of previous family history of genetic testing for hereditary cancer risks.  There is no reported Ashkenazi Jewish ancestry. There is no known consanguinity.    GENETIC TEST RESULTS: Genetic testing reported out on 11/28/2020 through the [facility] CancerNext-Expanded+RNA cancer panel found no pathogenic mutations.   The CancerNext-Expanded + RNAinsight gene panel offered by [employer] includes sequencing and rearrangement analysis for the following 77 genes: IP, ALK, APC*, ATM*, AXIN2, BAP1, BARD1, BLM, BMPR1A, BRCA1*, BRCA2*, BRIP1*, CDC73, CDH1*,CDK4, CDKN1B, CDKN2A, CHEK2*, CTNNA1, DICER1, FANCC, FH, FLCN, GALNT12, KIF1B, LZTR1, MAX, MEN1, MET, MLH1*, MSH2*, MSH3, MSH6*, MUTYH*, NBN, NF1*, NF2, NTHL1, PALB2*, PHOX2B, PMS2*, POT1, PRKAR1A,  PTCH1, PTEN*, RAD51C*, RAD51D*,RB1, RECQL, RET, SDHA, SDHAF2, SDHB, SDHC,  SDHD, SMAD4, SMARCA4, SMARCB1, SMARCE1, STK11, SUFU, TMEM127, TP53*,TSC1, TSC2, VHL and XRCC2 (sequencing and deletion/duplication); EGFR, EGLN1, HOXB13, KIT, MITF, PDGFRA, POLD1 and POLE (sequencing only); EPCAM and GREM1 (deletion/duplication only).   The test report has been scanned into EPIC and is located under the Molecular Pathology section of the Results Review tab.  A portion of the result report is included below for reference.     We discussed with Ms. Masek that because current genetic testing is not perfect, it is possible there may be a gene mutation in one of these genes that current testing cannot detect, but that chance is small.  We also discussed, that there could be another gene that has not yet been discovered, or that we have not yet tested, that is responsible for the cancer diagnoses in the family. It is also possible there is a hereditary cause for the cancer in the family that Ms. Standard did not inherit and therefore was not identified in her testing.  Therefore, it is important to remain in touch with cancer genetics in the future so that we can continue to offer Ms. Jaskolski the most up to date genetic testing.   ADDITIONAL GENETIC TESTING: We discussed with Ms. Linam that her genetic testing was fairly extensive.  If there are genes identified to increase cancer risk that can be analyzed in the future, we would be happy to discuss and coordinate this testing at that time.    CANCER SCREENING RECOMMENDATIONS: Ms. Mcbeth test result is considered negative (normal).  This means that we have not identified a hereditary cause for her  personal and family history of cancer at this time. Most cancers happen by chance and this negative test suggests that her cancer may fall into this category.    While reassuring, this does not definitively rule out a hereditary predisposition to cancer. It is still possible that there could be genetic mutations that are undetectable by current  technology. There could be genetic mutations in genes that have not been tested or identified to increase cancer risk.  Therefore, it is recommended she continue to follow the cancer management and screening guidelines provided by her oncology and primary healthcare provider.   An individual's cancer risk and medical management are not determined by genetic test results alone. Overall cancer risk assessment incorporates additional factors, including personal medical history, family history, and any available genetic information that may result in a personalized plan for cancer prevention and surveillance.  RECOMMENDATIONS FOR FAMILY MEMBERS:  Relatives in this family might be at some increased risk of developing cancer, over the general population risk, simply due to the family history of cancer.  We recommended female relatives in this family have a yearly mammogram beginning at age 36, or 108 years younger than the earliest onset of cancer, an annual clinical breast exam, and perform monthly breast self-exams. Female relatives in this family should also have a gynecological exam as recommended by their primary provider.  All family members should be referred for colonoscopy starting at age 74.    It is also possible there is a hereditary cause for the cancer in Ms. Llorens family that she did not inherit and therefore was not identified in her.  Based on Ms. Antigua family history, we recommended her mother/maternal relatives have genetic counseling and testing. Ms. Magley will let us know if we can be of any assistance in coordinating genetic counseling and/or testing  for these family members.  FOLLOW-UP: Lastly, we discussed with Ms. Quashie that cancer genetics is a rapidly advancing field and it is possible that new genetic tests will be appropriate for her and/or her family members in the future. We encouraged her to remain in contact with cancer genetics on an annual basis so we can update her personal  and family histories and let her know of advances in cancer genetics that may benefit this family.   Our contact number was provided. Ms. Bjorklund questions were answered to her satisfaction, and she knows she is welcome to call us at anytime with additional questions or concerns.   Faith Rogue, MS, [facility] Genetic Counselor [email or web address] Phone: [phone]

## 2020-11-28 NOTE — Telephone Encounter (Signed)
Revealed negative genetic testing.   We discussed that we do not know why she has breast cancer or why there is cancer in the family. It could be due to a different gene that we are not testing, or something our current technology cannot pick up.  It will be important for her to keep in contact with genetics to learn if additional testing may be needed in the future.  

## 2020-11-29 ENCOUNTER — Other Ambulatory Visit: Payer: Self-pay | Admitting: *Deleted

## 2020-11-29 ENCOUNTER — Telehealth: Payer: Self-pay | Admitting: *Deleted

## 2020-11-29 MED ORDER — METHYLPREDNISOLONE 4 MG PO TBPK: ORAL_TABLET | ORAL | 0 refills | Status: DC

## 2020-11-29 NOTE — Assessment & Plan Note (Signed)
10/31/20:Patient palpated a right breast mass x16mo Diagnostic mammogram and UKoreashowed an abnormality and calcifications in the right breast, 8.2cm, with skin thickening and right axillary adenopathy. 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  Treatment plan: 1. Neoadjuvant chemotherapy with Adriamycin and CytoxanKeytruda4 followed by Taxol weekly 12 with carboplatinand Keytrudaevery 3 weeks x421flowed by 1 year of torted Keytruda maintenance 2. Followed bymastectomy and axillary lymph node dissection 3. Followed by adjuvant radiation therapy UR CC nausea study CT CAP 11/14/2020: Enlarged right axillary lymph nodes, prominent left axillary lymph node.  (Requires an ultrasound) no evidence of metastatic disease in the abdomen or pelvis Bone scan 11/16/2020: No evidence of bone metastases --------------------------------------------------------------------------------------------------------------------------------------------------------- Current treatment: Cycle 1 day 8 Adriamycin Cytoxan and Keytruda Echocardiogram 11/19/2020: EF 70-75 %  Toxicities: 1. Maculopapular rash: Started Medrol dose pack 2.   Closely monitoring for chemotherapy toxicities.

## 2020-11-29 NOTE — Progress Notes (Signed)
Patient Care Team: Hague, Rosalyn Charters, MD as PCP - General (Internal Medicine) Rockwell Germany, RN as Oncology Nurse Navigator Mauro Kaufmann, RN as Oncology Nurse Navigator Donnie Mesa, MD as Consulting Physician (General Surgery) Nicholas Lose, MD as Consulting Physician (Hematology and Oncology) Gery Pray, MD as Consulting Physician (Radiation Oncology)  DIAGNOSIS:    ICD-10-CM   1. Malignant neoplasm of upper-outer quadrant of right breast in female, estrogen receptor negative ([place])  C50.411    Z17.1     SUMMARY OF ONCOLOGIC HISTORY: Oncology History  Malignant neoplasm of upper-outer quadrant of right breast in female, estrogen receptor negative ([place])  10/31/2020 Initial Diagnosis   Patient palpated a right breast mass x45mo Diagnostic mammogram and UKoreashowed an abnormality and calcifications in the right breast, 8.2cm, with skin thickening and right axillary adenopathy. 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   11/07/2020 Cancer Staging   Staging form: Breast, AJCC 8th Edition - Clinical: Stage IIIC (cT4, cN1, cM0, G3, ER-, PR-, HER2: Equivocal) - Signed by GNicholas Lose MD on 11/07/2020 Histologic grading system: 3 grade system   02/14/2021 -  Chemotherapy    Patient is on Treatment Plan: BREAST PEMBROLIZUMAB + AC Q21D X 4 CYCLES FOLLOWED BY PEMBROLIZUMAB + CARBOPLATIN D1 + PACLITAXEL D1,8,15 Q21D X 4 CYCLES       Genetic Testing   Negative genetic testing. No pathogenic variants identified on the [facility]. The report date is 11/28/2020.  The CancerNext-Expanded + RNAinsight gene panel offered by [employer] includes sequencing and rearrangement analysis for the following 77 genes: IP, ALK, APC*, ATM*, AXIN2, BAP1, BARD1, BLM, BMPR1A, BRCA1*, BRCA2*, BRIP1*, CDC73, CDH1*,CDK4, CDKN1B, CDKN2A, CHEK2*, CTNNA1, DICER1, FANCC, FH, FLCN, GALNT12,  KIF1B, LZTR1, MAX, MEN1, MET, MLH1*, MSH2*, MSH3, MSH6*, MUTYH*, NBN, NF1*, NF2, NTHL1, PALB2*, PHOX2B, PMS2*, POT1, PRKAR1A, PTCH1, PTEN*, RAD51C*, RAD51D*,RB1, RECQL, RET, SDHA, SDHAF2, SDHB, SDHC, SDHD, SMAD4, SMARCA4, SMARCB1, SMARCE1, STK11, SUFU, TMEM127, TP53*,TSC1, TSC2, VHL and XRCC2 (sequencing and deletion/duplication); EGFR, EGLN1, HOXB13, KIT, MITF, PDGFRA, POLD1 and POLE (sequencing only); EPCAM and GREM1 (deletion/duplication only).     CHIEF COMPLIANT: Cycle 1 Day 8 Adriamycin and CytoxanKeytruda  INTERVAL HISTORY: EKrystelle Prashadis a 45y.o. with above-mentioned history of right breast cancer currently on neoadjuvant chemotherapy with Adriamycin and CytoxanKeytruda. She presents to the clinic today for a toxicity check following cycle 1.   After first cycle of chemotherapy she experienced a day 2-3 severe nausea which made her very uncomfortable.  Was also accompanied by intermittent diarrhea for which she took Imodium.  After that she felt better.  The rash on her left shoulder and chest started couple days ago and it was itching in nature.  Yesterday we will start her on Medrol Dosepak.  She tells me that it is already starting to get better.  ALLERGIES:  is allergic to penicillins.  MEDICATIONS:  Current Outpatient Medications  Medication Sig Dispense Refill  . fluconazole (DIFLUCAN) 100 MG tablet Take 1 tablet (100 mg total) by mouth daily. 14 tablet 1  . dexamethasone (DECADRON) 4 MG tablet Take 1 tablet day before Adriamycin and Cytoxan and 1 tablet day after with food 10 tablet 0  . HYDROcodone-acetaminophen (NORCO/VICODIN) 5-325 MG tablet Take 1 tablet by mouth every 6 (six) hours as needed for moderate pain. 15 tablet 0  . lidocaine-prilocaine (EMLA) cream Apply to affected area once 30 g 3  .  methylPREDNISolone (MEDROL DOSEPAK) 4 MG TBPK tablet Take as directed 21 tablet 0  . ondansetron (ZOFRAN) 8 MG tablet Take 1 tablet (8 mg total) by mouth 2 (two) times daily as  needed. Start on the third day after carboplatin and AC chemotherapy. 30 tablet 1  . prochlorperazine (COMPAZINE) 10 MG tablet Take 1 tablet (10 mg total) by mouth every 6 (six) hours as needed (Nausea or vomiting). 30 tablet 1   No current facility-administered medications for this visit.    PHYSICAL EXAMINATION: ECOG PERFORMANCE STATUS: 1 - Symptomatic but completely ambulatory      Vitals:   11/30/20 0840  BP: (!) 145/80  Pulse: 96  Resp: 18  Temp: (!) 97.3 F (36.3 C)  SpO2: 100%   Filed Weights   11/30/20 0840  Weight: 276 lb (125.2 kg)    LABORATORY DATA:  I have reviewed the data as listed CMP Latest Ref Rng & Units 11/22/2020 11/07/2020 02/19/2014  Glucose 70 - 99 mg/dL 103(H) 82 99  BUN 6 - 20 mg/dL _0 Creatinine 0.44 - 1.00 mg/dL 0.83 0.73 0.71  Sodium 135 - 145 mmol/L 141 141 140  Potassium 3.5 - 5.1 mmol/L 3.3(L) 3.9 4.2  Chloride 98 - 111 mmol/L 110 107 105  CO2 22 - 32 mmol/L _1 Calcium 8.9 - 10.3 mg/dL 9.0 8.9 9.1  Total Protein 6.5 - 8.1 g/dL 7.6 7.7 7.6  Total Bilirubin 0.3 - 1.2 mg/dL 0.2(L) 0.3 0.4  Alkaline Phos 38 - 126 U/L 67 55 56  AST 15 - 41 U/L 13(L) 17 15  ALT 0 - 44 U/L _2 Lab Results  Component Value Date   WBC 3.7 (L) 11/30/2020   HGB 9.8 (L) 11/30/2020   HCT 29.5 (L) 11/30/2020   MCV 91.0 11/30/2020   PLT 209 11/30/2020   NEUTROABS PENDING 11/30/2020    ASSESSMENT & PLAN:  Malignant neoplasm of upper-outer quadrant of right breast in female, estrogen receptor negative ([place]) 10/31/20:Patient palpated a right breast mass x44mo Diagnostic mammogram and UKoreashowed an abnormality and calcifications in the right breast, 8.2cm, with skin thickening and right axillary adenopathy. 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  Treatment plan: 1. Neoadjuvant chemotherapy with Adriamycin and CytoxanKeytruda4 followed by Taxol  weekly 12 with carboplatinand Keytrudaevery 3 weeks x435flowed by 1 year of torted Keytruda maintenance 2. Followed bymastectomy and axillary lymph node dissection 3. Followed by adjuvant radiation therapy UR CC nausea study CT CAP 11/14/2020: Enlarged right axillary lymph nodes, prominent left axillary lymph node.  (Requires an ultrasound) no evidence of metastatic disease in the abdomen or pelvis Bone scan 11/16/2020: No evidence of bone metastases --------------------------------------------------------------------------------------------------------------------------------------------------------- Current treatment: Cycle 1 day 8 Adriamycin Cytoxan and Keytruda Echocardiogram 11/19/2020: EF 70-75 %  Toxicities: 1. Maculopapular rash: Started Medrol dose pack 2. severe nausea lasted 2 days: I decrease the dosage of Adriamycin and Cytoxan for cycle 2 3.  Diarrhea lasted 2 days 4.  Thrush: Sent a prescription for Diflucan  Closely monitoring for chemotherapy toxicities.    No orders of the defined types were placed in this encounter.  The patient has a good understanding of the overall plan. she agrees with it. she will call with any problems that may develop before the next visit here.  Total time spent: 30 mins including face to face time and time spent for  planning, charting and coordination of care  Rulon Eisenmenger, MD, MPH 11/30/2020  I, Molly Dorshimer, am acting as scribe for Dr. Nicholas Lose.  I have reviewed the above documentation for accuracy and completeness, and I agree with the above.

## 2020-11-29 NOTE — Telephone Encounter (Signed)
Received VM from pt.  Attempt x1 to return call.  No answer, unable to LVM due to VM not being set up.

## 2020-11-29 NOTE — Progress Notes (Signed)
Received call from pt with complaint of rash on bilateral arms and chest x2 days.  Pt states rash consists of small papules and is very itchy.  Pt states she has tried OTC benadryl cream and hydrocortisone cream with no relief in symptoms.  Per MD pt needing to start Medrol dose pack.  Prescription sent to pharmacy on file and pt verbalized understanding of administration instructions.

## 2020-11-30 ENCOUNTER — Inpatient Hospital Stay: Payer: No Typology Code available for payment source

## 2020-11-30 ENCOUNTER — Encounter: Payer: Self-pay | Admitting: *Deleted

## 2020-11-30 ENCOUNTER — Other Ambulatory Visit: Payer: Self-pay

## 2020-11-30 ENCOUNTER — Inpatient Hospital Stay (HOSPITAL_BASED_OUTPATIENT_CLINIC_OR_DEPARTMENT_OTHER): Payer: No Typology Code available for payment source | Admitting: Hematology and Oncology

## 2020-11-30 DIAGNOSIS — Z5112 Encounter for antineoplastic immunotherapy: Secondary | ICD-10-CM | POA: Diagnosis not present

## 2020-11-30 DIAGNOSIS — Z171 Estrogen receptor negative status [ER-]: Secondary | ICD-10-CM | POA: Diagnosis not present

## 2020-11-30 DIAGNOSIS — C50411 Malignant neoplasm of upper-outer quadrant of right female breast: Secondary | ICD-10-CM | POA: Diagnosis not present

## 2020-11-30 DIAGNOSIS — Z95828 Presence of other vascular implants and grafts: Secondary | ICD-10-CM

## 2020-11-30 LAB — CBC WITH DIFFERENTIAL/PLATELET
Abs Immature Granulocytes: 0.05 10*3/uL (ref 0.00–0.07)
Basophils Absolute: 0 10*3/uL (ref 0.0–0.1)
Basophils Relative: 1 %
Eosinophils Absolute: 0.1 10*3/uL (ref 0.0–0.5)
Eosinophils Relative: 2 %
HCT: 29.5 % — ABNORMAL LOW (ref 36.0–46.0)
Hemoglobin: 9.8 g/dL — ABNORMAL LOW (ref 12.0–15.0)
Immature Granulocytes: 1 %
Lymphocytes Relative: 38 %
Lymphs Abs: 1.4 10*3/uL (ref 0.7–4.0)
MCH: 30.2 pg (ref 26.0–34.0)
MCHC: 33.2 g/dL (ref 30.0–36.0)
MCV: 91 fL (ref 80.0–100.0)
Monocytes Absolute: 0.2 10*3/uL (ref 0.1–1.0)
Monocytes Relative: 5 %
Neutro Abs: 1.9 10*3/uL (ref 1.7–7.7)
Neutrophils Relative %: 53 %
Platelets: 209 10*3/uL (ref 150–400)
RBC: 3.24 MIL/uL — ABNORMAL LOW (ref 3.87–5.11)
RDW: 12.7 % (ref 11.5–15.5)
WBC: 3.7 10*3/uL — ABNORMAL LOW (ref 4.0–10.5)
nRBC: 0 % (ref 0.0–0.2)

## 2020-11-30 LAB — COMPREHENSIVE METABOLIC PANEL
ALT: 13 U/L (ref 0–44)
AST: 15 U/L (ref 15–41)
Albumin: 3.1 g/dL — ABNORMAL LOW (ref 3.5–5.0)
Alkaline Phosphatase: 89 U/L (ref 38–126)
Anion gap: 8 (ref 5–15)
BUN: 10 mg/dL (ref 6–20)
CO2: 24 mmol/L (ref 22–32)
Calcium: 8.9 mg/dL (ref 8.9–10.3)
Chloride: 108 mmol/L (ref 98–111)
Creatinine, Ser: 0.7 mg/dL (ref 0.44–1.00)
GFR, Estimated: >60 mL/min (ref >60)
Glucose, Bld: 115 mg/dL — ABNORMAL HIGH (ref 70–99)
Potassium: 3.8 mmol/L (ref 3.5–5.1)
Sodium: 140 mmol/L (ref 135–145)
Total Bilirubin: <0.2 mg/dL — ABNORMAL LOW (ref 0.3–1.2)
Total Protein: 7 g/dL (ref 6.5–8.1)

## 2020-11-30 LAB — TSH: TSH: 0.401 u[IU]/mL (ref 0.308–3.960)

## 2020-11-30 MED ORDER — SODIUM CHLORIDE 0.9% FLUSH
10.0000 mL | Freq: Once | INTRAVENOUS | Status: AC
  Administered 2020-11-30: 10 mL
  Filled 2020-11-30: qty 10

## 2020-11-30 MED ORDER — FLUCONAZOLE 100 MG PO TABS: 100.0000 mg | ORAL_TABLET | Freq: Every day | ORAL | 1 refills | Status: DC

## 2020-11-30 MED ORDER — HEPARIN SOD (PORK) LOCK FLUSH 100 UNIT/ML IV SOLN
500.0000 [IU] | Freq: Once | INTRAVENOUS | Status: AC
  Administered 2020-11-30: 500 [IU]
  Filled 2020-11-30: qty 5

## 2020-12-01 LAB — T4: T4, Total: 8.2 ug/dL (ref 4.5–12.0)

## 2020-12-02 ENCOUNTER — Other Ambulatory Visit: Payer: No Typology Code available for payment source

## 2020-12-13 ENCOUNTER — Ambulatory Visit: Payer: No Typology Code available for payment source

## 2020-12-13 ENCOUNTER — Other Ambulatory Visit: Payer: No Typology Code available for payment source

## 2020-12-13 ENCOUNTER — Ambulatory Visit: Payer: No Typology Code available for payment source | Admitting: Hematology and Oncology

## 2020-12-15 ENCOUNTER — Ambulatory Visit: Payer: No Typology Code available for payment source

## 2020-12-19 NOTE — Progress Notes (Signed)
Patient Care Team: Amber White, Amber Charters, MD as PCP - General (Internal Medicine) Rockwell Germany, RN as Oncology Nurse Navigator Amber Kaufmann, RN as Oncology Nurse Navigator Amber Mesa, MD as Consulting Physician (General Surgery) Amber Lose, MD as Consulting Physician (Hematology and Oncology) Amber Pray, MD as Consulting Physician (Radiation Oncology)  DIAGNOSIS:    ICD-10-CM   1. Malignant neoplasm of upper-outer quadrant of right breast in female, estrogen receptor negative ([place])  C50.411    Z17.1       SUMMARY OF ONCOLOGIC HISTORY: Oncology History  Malignant neoplasm of upper-outer quadrant of right breast in female, estrogen receptor negative ([place])  10/31/2020 Initial Diagnosis   Patient palpated a right breast mass x21mo Diagnostic mammogram and UKoreashowed an abnormality and calcifications in the right breast, 8.2cm, with skin thickening and right axillary adenopathy. 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   11/07/2020 Cancer Staging   Staging form: Breast, AJCC 8th Edition - Clinical: Stage IIIC (cT4, cN1, cM0, G3, ER-, PR-, HER2: Equivocal) - Signed by GNicholas Lose MD on 11/07/2020  Histologic grading system: 3 grade system    02/21/2021 -  Chemotherapy    Patient is on Treatment Plan: BREAST PEMBROLIZUMAB + AC Q21D X 4 CYCLES FOLLOWED BY PEMBROLIZUMAB + CARBOPLATIN D1 + PACLITAXEL D1,8,15 Q21D X 4 CYCLES        Genetic Testing   Negative genetic testing. No pathogenic variants identified on the [facility] panel. The report date is 11/28/2020.  The CancerNext-Expanded + RNAinsight gene panel offered by [employer] includes sequencing and rearrangement analysis for the following 77 genes: IP, ALK, APC*, ATM*, AXIN2, BAP1, BARD1, BLM, BMPR1A, BRCA1*, BRCA2*, BRIP1*, CDC73, CDH1*,CDK4, CDKN1B, CDKN2A, CHEK2*, CTNNA1, DICER1, FANCC, FH, FLCN,  GALNT12, KIF1B, LZTR1, MAX, MEN1, MET, MLH1*, MSH2*, MSH3, MSH6*, MUTYH*, NBN, NF1*, NF2, NTHL1, PALB2*, PHOX2B, PMS2*, POT1, PRKAR1A, PTCH1, PTEN*, RAD51C*, RAD51D*,RB1, RECQL, RET, SDHA, SDHAF2, SDHB, SDHC, SDHD, SMAD4, SMARCA4, SMARCB1, SMARCE1, STK11, SUFU, TMEM127, TP53*,TSC1, TSC2, VHL and XRCC2 (sequencing and deletion/duplication); EGFR, EGLN1, HOXB13, KIT, MITF, PDGFRA, POLD1 and POLE (sequencing only); EPCAM and GREM1 (deletion/duplication only).     CHIEF COMPLIANT: Cycle 2 Day 1 Adriamycin and Cytoxan Keytruda  INTERVAL HISTORY: EDorri Ozturkis a 45y.o. with above-mentioned history of right breast cancer currently on neoadjuvant chemotherapy with Adriamycin and Cytoxan Keytruda. She presents to the clinic today for a toxicity check and cycle 2.   Most of the symptoms related to first cycle of chemotherapy have resolved.  TRitta Slotis resolved rash is resolved.  Energy levels are back.  Taste is slightly off.  Diarrhea and nausea have resolved completely.  ALLERGIES:  is allergic to penicillins.  MEDICATIONS:  Current Outpatient Medications  Medication Sig Dispense Refill   dexamethasone (DECADRON) 4 MG tablet Take 1 tablet day before Adriamycin and Cytoxan and 1 tablet day after with food 10 tablet 0   fluconazole (DIFLUCAN) 100 MG tablet Take 1 tablet (100 mg total) by mouth daily. 14 tablet 1   HYDROcodone-acetaminophen (NORCO/VICODIN) 5-325 MG tablet Take 1 tablet by mouth every 6 (six) hours as needed for moderate pain. 15 tablet 0   lidocaine-prilocaine (EMLA) cream Apply to affected area once 30 g 3   methylPREDNISolone (MEDROL DOSEPAK) 4 MG TBPK tablet Take as directed 21 tablet 0   ondansetron (ZOFRAN) 8 MG tablet Take 1 tablet (8 mg total) by mouth 2 (two)  times daily as needed. Start on the third day after carboplatin and AC chemotherapy. 30 tablet 1   prochlorperazine (COMPAZINE) 10 MG tablet Take 1 tablet (10 mg total) by mouth every 6 (six) hours as needed (Nausea or  vomiting). 30 tablet 1   No current facility-administered medications for this visit.    PHYSICAL EXAMINATION: ECOG PERFORMANCE STATUS: 1 - Symptomatic but completely ambulatory  Vitals:   12/20/20 1016  BP: (!) 141/79  Pulse: 96  Resp: 18  Temp: (!) 97.4 F (36.3 C)  SpO2: 99%   Filed Weights   12/20/20 1016  Weight: 277 lb 1.6 oz (125.7 kg)    LABORATORY DATA:  I have reviewed the data as listed CMP Latest Ref Rng & Units 11/30/2020 11/22/2020 11/07/2020  Glucose 70 - 99 mg/dL 115(H) 103(H) 82  BUN 6 - 20 mg/dL '10 11 9  ' Creatinine 0.44 - 1.00 mg/dL 0.70 0.83 0.73  Sodium 135 - 145 mmol/L 140 141 141  Potassium 3.5 - 5.1 mmol/L 3.8 3.3(L) 3.9  Chloride 98 - 111 mmol/L 108 110 107  CO2 22 - 32 mmol/L '24 23 24  ' Calcium 8.9 - 10.3 mg/dL 8.9 9.0 8.9  Total Protein 6.5 - 8.1 g/dL 7.0 7.6 7.7  Total Bilirubin 0.3 - 1.2 mg/dL <0.2(L) 0.2(L) 0.3  Alkaline Phos 38 - 126 U/L 89 67 55  AST 15 - 41 U/L 15 13(L) 17  ALT 0 - 44 U/L '13 11 14    ' Lab Results  Component Value Date   WBC 4.5 12/20/2020   HGB 10.2 (L) 12/20/2020   HCT 30.9 (L) 12/20/2020   MCV 91.7 12/20/2020   PLT 379 12/20/2020   NEUTROABS 2.3 12/20/2020    ASSESSMENT & PLAN:  Malignant neoplasm of upper-outer quadrant of right breast in female, estrogen receptor negative ([place]) 10/31/20: Patient palpated a right breast mass x30mo Diagnostic mammogram and UKoreashowed an abnormality and calcifications in the right breast, 8.2cm, with skin thickening and right axillary adenopathy. 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   Treatment plan: 1. Neoadjuvant chemotherapy with Adriamycin and Cytoxan Keytruda 4 followed by Taxol weekly 12 with carboplatin and Keytruda every 3 weeks x4 followed by 1 year of torted Keytruda maintenance 2. Followed by mastectomy and axillary lymph node dissection  3. Followed by adjuvant radiation  therapy UR CC nausea study CT CAP 11/14/2020: Enlarged right axillary lymph nodes, prominent left axillary lymph node.  (Requires an ultrasound) no evidence of metastatic disease in the abdomen or pelvis Bone scan 11/16/2020: No evidence of bone metastases --------------------------------------------------------------------------------------------------------------------------------------------------------- Current treatment: Cycle 2 Adriamycin Cytoxan and Keytruda Echocardiogram 11/19/2020: EF 70-75 %   Toxicities: 1. Maculopapular rash: Improved with Medrol Dosepak 2. severe nausea lasted 2 days: I decreased the dosage of Adriamycin and Cytoxan for cycle 2 3.  Diarrhea: Improved 4.  Thrush: Resolved with Diflucan   Contralateral axillary lymph node: Patient has ultrasound and a biopsy set up for 12/25/2020. Closely monitoring for chemotherapy toxicities. RTC in 2 weeks for cycle 3    No orders of the defined types were placed in this encounter.  The patient has a good understanding of the overall plan. she agrees with it. she will call with any problems that may develop before the next visit here.  Total time spent: 30 mins including face to face time and time spent for planning, charting and coordination of care  Rulon Eisenmenger, MD, MPH 12/20/2020  I, Molly Dorshimer, am acting as scribe for Dr. Nicholas White.  I have reviewed the above documentation for accuracy and completeness, and I agree with the above.

## 2020-12-19 NOTE — Assessment & Plan Note (Signed)
10/31/20:Patient palpated a right breast mass x70mo Diagnostic mammogram and UKoreashowed an abnormality and calcifications in the right breast, 8.2cm, with skin thickening and right axillary adenopathy. 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  Treatment plan: 1. Neoadjuvant chemotherapy with Adriamycin and CytoxanKeytruda4 followed by Taxol weekly 12 with carboplatinand Keytrudaevery 3 weeks x442flowed by 1 year of torted Keytruda maintenance 2. Followed bymastectomy and axillary lymph node dissection 3. Followed by adjuvant radiation therapy UR CC nausea study CT CAP 11/14/2020: Enlarged right axillary lymph nodes, prominent left axillary lymph node. (Requires an ultrasound) no evidence of metastatic disease in the abdomen or pelvis Bone scan 11/16/2020: No evidence of bone metastases --------------------------------------------------------------------------------------------------------------------------------------------------------- Current treatment:Cycle 2 Adriamycin Cytoxan and Keytruda Echocardiogram5/03/2021: EF70-75%  Toxicities: 1. Maculopapular rash: Started Medrol dose pack 2. severe nausea lasted 2 days: I decrease the dosage of Adriamycin and Cytoxan for cycle 2 3.  Diarrhea lasted 2 days 4.  Thrush: Sent a prescription for Diflucan  Closely monitoring for chemotherapy toxicities. RTC in 2 weeks for cycle 3

## 2020-12-20 ENCOUNTER — Other Ambulatory Visit: Payer: Self-pay

## 2020-12-20 ENCOUNTER — Ambulatory Visit: Payer: No Typology Code available for payment source

## 2020-12-20 ENCOUNTER — Inpatient Hospital Stay (HOSPITAL_BASED_OUTPATIENT_CLINIC_OR_DEPARTMENT_OTHER): Payer: No Typology Code available for payment source | Admitting: Hematology and Oncology

## 2020-12-20 ENCOUNTER — Inpatient Hospital Stay: Payer: No Typology Code available for payment source | Attending: Hematology and Oncology

## 2020-12-20 DIAGNOSIS — Z79899 Other long term (current) drug therapy: Secondary | ICD-10-CM | POA: Insufficient documentation

## 2020-12-20 DIAGNOSIS — Z171 Estrogen receptor negative status [ER-]: Secondary | ICD-10-CM

## 2020-12-20 DIAGNOSIS — Z95828 Presence of other vascular implants and grafts: Secondary | ICD-10-CM

## 2020-12-20 DIAGNOSIS — Z5111 Encounter for antineoplastic chemotherapy: Secondary | ICD-10-CM | POA: Insufficient documentation

## 2020-12-20 DIAGNOSIS — Z5189 Encounter for other specified aftercare: Secondary | ICD-10-CM | POA: Diagnosis not present

## 2020-12-20 DIAGNOSIS — C50411 Malignant neoplasm of upper-outer quadrant of right female breast: Secondary | ICD-10-CM | POA: Diagnosis not present

## 2020-12-20 DIAGNOSIS — C773 Secondary and unspecified malignant neoplasm of axilla and upper limb lymph nodes: Secondary | ICD-10-CM | POA: Diagnosis not present

## 2020-12-20 DIAGNOSIS — Z5112 Encounter for antineoplastic immunotherapy: Secondary | ICD-10-CM | POA: Diagnosis not present

## 2020-12-20 LAB — CBC WITH DIFFERENTIAL/PLATELET
Abs Immature Granulocytes: 0.01 10*3/uL (ref 0.00–0.07)
Basophils Absolute: 0 10*3/uL (ref 0.0–0.1)
Basophils Relative: 1 %
Eosinophils Absolute: 0.1 10*3/uL (ref 0.0–0.5)
Eosinophils Relative: 2 %
HCT: 30.9 % — ABNORMAL LOW (ref 36.0–46.0)
Hemoglobin: 10.2 g/dL — ABNORMAL LOW (ref 12.0–15.0)
Immature Granulocytes: 0 %
Lymphocytes Relative: 35 %
Lymphs Abs: 1.6 10*3/uL (ref 0.7–4.0)
MCH: 30.3 pg (ref 26.0–34.0)
MCHC: 33 g/dL (ref 30.0–36.0)
MCV: 91.7 fL (ref 80.0–100.0)
Monocytes Absolute: 0.6 10*3/uL (ref 0.1–1.0)
Monocytes Relative: 13 %
Neutro Abs: 2.3 10*3/uL (ref 1.7–7.7)
Neutrophils Relative %: 49 %
Platelets: 379 10*3/uL (ref 150–400)
RBC: 3.37 MIL/uL — ABNORMAL LOW (ref 3.87–5.11)
RDW: 14.2 % (ref 11.5–15.5)
WBC: 4.5 10*3/uL (ref 4.0–10.5)
nRBC: 0 % (ref 0.0–0.2)

## 2020-12-20 LAB — COMPREHENSIVE METABOLIC PANEL
ALT: 9 U/L (ref 0–44)
AST: 12 U/L — ABNORMAL LOW (ref 15–41)
Albumin: 3.1 g/dL — ABNORMAL LOW (ref 3.5–5.0)
Alkaline Phosphatase: 54 U/L (ref 38–126)
Anion gap: 5 (ref 5–15)
BUN: 11 mg/dL (ref 6–20)
CO2: 25 mmol/L (ref 22–32)
Calcium: 9 mg/dL (ref 8.9–10.3)
Chloride: 108 mmol/L (ref 98–111)
Creatinine, Ser: 0.7 mg/dL (ref 0.44–1.00)
GFR, Estimated: >60 mL/min (ref >60)
Glucose, Bld: 94 mg/dL (ref 70–99)
Potassium: 3.9 mmol/L (ref 3.5–5.1)
Sodium: 138 mmol/L (ref 135–145)
Total Bilirubin: 0.4 mg/dL (ref 0.3–1.2)
Total Protein: 7.1 g/dL (ref 6.5–8.1)

## 2020-12-20 LAB — TSH: TSH: 0.457 u[IU]/mL (ref 0.308–3.960)

## 2020-12-20 MED ORDER — PALONOSETRON HCL INJECTION 0.25 MG/5ML
INTRAVENOUS | Status: AC
  Filled 2020-12-20: qty 5

## 2020-12-20 MED ORDER — HEPARIN SOD (PORK) LOCK FLUSH 100 UNIT/ML IV SOLN
500.0000 [IU] | Freq: Once | INTRAVENOUS | Status: AC | PRN
  Administered 2020-12-20: 500 [IU]
  Filled 2020-12-20: qty 5

## 2020-12-20 MED ORDER — SODIUM CHLORIDE 0.9 % IV SOLN
150.0000 mg | Freq: Once | INTRAVENOUS | Status: AC
  Administered 2020-12-20: 150 mg via INTRAVENOUS
  Filled 2020-12-20: qty 150

## 2020-12-20 MED ORDER — SODIUM CHLORIDE 0.9% FLUSH
10.0000 mL | INTRAVENOUS | Status: DC | PRN
  Administered 2020-12-20: 10 mL
  Filled 2020-12-20: qty 10

## 2020-12-20 MED ORDER — SODIUM CHLORIDE 0.9 % IV SOLN
Freq: Once | INTRAVENOUS | Status: AC
Start: 2020-12-20 — End: 2020-12-20
  Filled 2020-12-20: qty 250

## 2020-12-20 MED ORDER — SODIUM CHLORIDE 0.9 % IV SOLN
200.0000 mg | Freq: Once | INTRAVENOUS | Status: AC
  Administered 2020-12-20: 200 mg via INTRAVENOUS
  Filled 2020-12-20: qty 8

## 2020-12-20 MED ORDER — CYCLOPHOSPHAMIDE CHEMO INJECTION 1 GM
500.0000 mg/m2 | Freq: Once | INTRAMUSCULAR | Status: AC
  Administered 2020-12-20: 1200 mg via INTRAVENOUS
  Filled 2020-12-20: qty 60

## 2020-12-20 MED ORDER — SODIUM CHLORIDE 0.9% FLUSH
10.0000 mL | Freq: Once | INTRAVENOUS | Status: AC
  Administered 2020-12-20: 10 mL
  Filled 2020-12-20: qty 10

## 2020-12-20 MED ORDER — DOXORUBICIN HCL CHEMO IV INJECTION 2 MG/ML
50.0000 mg/m2 | Freq: Once | INTRAVENOUS | Status: AC
  Administered 2020-12-20: 120 mg via INTRAVENOUS
  Filled 2020-12-20: qty 60

## 2020-12-20 MED ORDER — PALONOSETRON HCL INJECTION 0.25 MG/5ML
0.2500 mg | Freq: Once | INTRAVENOUS | Status: AC
  Administered 2020-12-20: 0.25 mg via INTRAVENOUS

## 2020-12-20 MED ORDER — DEXAMETHASONE SODIUM PHOSPHATE 100 MG/10ML IJ SOLN
10.0000 mg | Freq: Once | INTRAMUSCULAR | Status: AC
  Administered 2020-12-20: 10 mg via INTRAVENOUS
  Filled 2020-12-20: qty 10

## 2020-12-20 NOTE — Patient Instructions (Signed)
[facility] ONCOL[facility]  Discharge Instructions: Thank you for choosing [facility] to provide your oncology and hematology care.   If you have a lab appointment with the [facility], please go directly to the [facility] and check in at the registration area.   Wear comfortable clothing and clothing appropriate for easy access to any Portacath or PICC line.   We strive to give you quality time with your provider. You may need to reschedule your appointment if you arrive late (15 or more minutes).  Arriving late affects you and other patients whose appointments are after yours.  Also, if you miss three or more appointments without notifying the office, you may be dismissed from the clinic at the provider's discretion.      For prescription refill requests, have your pharmacy contact our office and allow 72 hours for refills to be completed.    Today you received the following chemotherapy and/or immunotherapy agents Keytruda, Adriamycin, Cytoxan      To help prevent nausea and vomiting after your treatment, we encourage you to take your nausea medication as directed.  BELOW ARE SYMPTOMS THAT SHOULD BE REPORTED IMMEDIATELY: *FEVER GREATER THAN 100.4 F (38 C) OR HIGHER *CHILLS OR SWEATING *NAUSEA AND VOMITING THAT IS NOT CONTROLLED WITH YOUR NAUSEA MEDICATION *UNUSUAL SHORTNESS OF BREATH *UNUSUAL BRUISING OR BLEEDING *URINARY PROBLEMS (pain or burning when urinating, or frequent urination) *BOWEL PROBLEMS (unusual diarrhea, constipation, pain near the anus) TENDERNESS IN MOUTH AND THROAT WITH OR WITHOUT PRESENCE OF ULCERS (sore throat, sores in mouth, or a toothache) UNUSUAL RASH, SWELLING OR PAIN  UNUSUAL VAGINAL DISCHARGE OR ITCHING   Items with * indicate a potential emergency and should be followed up as soon as possible or go to the Emergency Department if any problems should occur.  Please show the CHEMOTHERAPY ALERT CARD or IMMUNOTHERAPY ALERT  CARD at check-in to the Emergency Department and triage nurse.  Should you have questions after your visit or need to cancel or reschedule your appointment, please contact [facility]  Dept: [phone]  and follow the prompts.  Office hours are 8:00 a.m. to 4:30 p.m. Monday - Friday. Please note that voicemails left after 4:00 p.m. may not be returned until the following business day.  We are closed weekends and major holidays. You have access to a nurse at all times for urgent questions. Please call the main number to the clinic Dept: [phone] and follow the prompts.   For any non-urgent questions, you may also contact your provider using MyChart. We now offer e-Visits for anyone 27 and older to request care online for non-urgent symptoms. For details visit [email or web address].   Also download the MyChart app! Go to the app store, search "MyChart", open the app, select [facility], and log in with your MyChart username and password.  Due to Covid, a mask is required upon entering the hospital/clinic. If you do not have a mask, one will be given to you upon arrival. For doctor visits, patients may have 1 support person aged 33 or older with them. For treatment visits, patients cannot have anyone with them due to current Covid guidelines and our immunocompromised population.

## 2020-12-21 LAB — T4: T4, Total: 5.7 ug/dL (ref 4.5–12.0)

## 2020-12-22 ENCOUNTER — Inpatient Hospital Stay: Payer: No Typology Code available for payment source

## 2020-12-22 ENCOUNTER — Other Ambulatory Visit: Payer: Self-pay

## 2020-12-22 VITALS — BP 149/92 | HR 87 | Temp 97.2°F | Resp 18

## 2020-12-22 DIAGNOSIS — C50411 Malignant neoplasm of upper-outer quadrant of right female breast: Secondary | ICD-10-CM

## 2020-12-22 DIAGNOSIS — Z171 Estrogen receptor negative status [ER-]: Secondary | ICD-10-CM

## 2020-12-22 DIAGNOSIS — Z5112 Encounter for antineoplastic immunotherapy: Secondary | ICD-10-CM | POA: Diagnosis not present

## 2020-12-22 MED ORDER — PEGFILGRASTIM-BMEZ 6 MG/0.6ML ~~LOC~~ SOSY
6.0000 mg | PREFILLED_SYRINGE | Freq: Once | SUBCUTANEOUS | Status: AC
  Administered 2020-12-22: 6 mg via SUBCUTANEOUS

## 2020-12-24 ENCOUNTER — Inpatient Hospital Stay: Payer: No Typology Code available for payment source

## 2020-12-24 ENCOUNTER — Other Ambulatory Visit: Payer: Self-pay

## 2020-12-24 ENCOUNTER — Encounter: Payer: Self-pay | Admitting: Adult Health

## 2020-12-24 ENCOUNTER — Inpatient Hospital Stay (HOSPITAL_BASED_OUTPATIENT_CLINIC_OR_DEPARTMENT_OTHER): Payer: No Typology Code available for payment source | Admitting: Adult Health

## 2020-12-24 ENCOUNTER — Other Ambulatory Visit: Payer: Self-pay | Admitting: *Deleted

## 2020-12-24 VITALS — BP 128/82 | HR 96 | Temp 97.5°F | Resp 18 | Ht 64.0 in | Wt 297.3 lb

## 2020-12-24 VITALS — BP 109/77 | HR 94 | Resp 16

## 2020-12-24 DIAGNOSIS — Z171 Estrogen receptor negative status [ER-]: Secondary | ICD-10-CM

## 2020-12-24 DIAGNOSIS — C50411 Malignant neoplasm of upper-outer quadrant of right female breast: Secondary | ICD-10-CM

## 2020-12-24 DIAGNOSIS — Z95828 Presence of other vascular implants and grafts: Secondary | ICD-10-CM

## 2020-12-24 DIAGNOSIS — Z5112 Encounter for antineoplastic immunotherapy: Secondary | ICD-10-CM | POA: Diagnosis not present

## 2020-12-24 LAB — CBC WITH DIFFERENTIAL (CANCER CENTER ONLY)
Abs Immature Granulocytes: 0 10*3/uL (ref 0.00–0.07)
Band Neutrophils: 1 %
Basophils Absolute: 0 10*3/uL (ref 0.0–0.1)
Basophils Relative: 0 %
Eosinophils Absolute: 0.3 10*3/uL (ref 0.0–0.5)
Eosinophils Relative: 1 %
HCT: 32.8 % — ABNORMAL LOW (ref 36.0–46.0)
Hemoglobin: 10.8 g/dL — ABNORMAL LOW (ref 12.0–15.0)
Lymphocytes Relative: 3 %
Lymphs Abs: 0.8 10*3/uL (ref 0.7–4.0)
MCH: 30.3 pg (ref 26.0–34.0)
MCHC: 32.9 g/dL (ref 30.0–36.0)
MCV: 92.1 fL (ref 80.0–100.0)
Monocytes Absolute: 0.3 10*3/uL (ref 0.1–1.0)
Monocytes Relative: 1 %
Neutro Abs: 25.1 10*3/uL — ABNORMAL HIGH (ref 1.7–7.7)
Neutrophils Relative %: 94 %
Platelet Count: 352 10*3/uL (ref 150–400)
RBC: 3.56 MIL/uL — ABNORMAL LOW (ref 3.87–5.11)
RDW: 14.2 % (ref 11.5–15.5)
WBC Count: 26.4 10*3/uL — ABNORMAL HIGH (ref 4.0–10.5)
nRBC: 0 % (ref 0.0–0.2)

## 2020-12-24 LAB — CMP (CANCER CENTER ONLY)
ALT: 8 U/L (ref 0–44)
AST: 9 U/L — ABNORMAL LOW (ref 15–41)
Albumin: 3 g/dL — ABNORMAL LOW (ref 3.5–5.0)
Alkaline Phosphatase: 83 U/L (ref 38–126)
Anion gap: 8 (ref 5–15)
BUN: 13 mg/dL (ref 6–20)
CO2: 24 mmol/L (ref 22–32)
Calcium: 8.5 mg/dL — ABNORMAL LOW (ref 8.9–10.3)
Chloride: 108 mmol/L (ref 98–111)
Creatinine: 0.67 mg/dL (ref 0.44–1.00)
GFR, Estimated: >60 mL/min (ref >60)
Glucose, Bld: 103 mg/dL — ABNORMAL HIGH (ref 70–99)
Potassium: 4 mmol/L (ref 3.5–5.1)
Sodium: 140 mmol/L (ref 135–145)
Total Bilirubin: 0.4 mg/dL (ref 0.3–1.2)
Total Protein: 7.1 g/dL (ref 6.5–8.1)

## 2020-12-24 LAB — MAGNESIUM: Magnesium: 1.8 mg/dL (ref 1.7–2.4)

## 2020-12-24 MED ORDER — SODIUM CHLORIDE 0.9 % IV SOLN
INTRAVENOUS | Status: DC
Start: 2020-12-24 — End: 2020-12-24
  Filled 2020-12-24 (×2): qty 250

## 2020-12-24 MED ORDER — OLANZAPINE 5 MG PO TABS
5.0000 mg | ORAL_TABLET | Freq: Every day | ORAL | Status: AC
  Administered 2020-12-24: 5 mg via ORAL
  Filled 2020-12-24: qty 1

## 2020-12-24 MED ORDER — PROCHLORPERAZINE EDISYLATE 10 MG/2ML IJ SOLN
INTRAMUSCULAR | Status: AC
  Filled 2020-12-24: qty 2

## 2020-12-24 MED ORDER — FAMOTIDINE 20 MG IN NS 100 ML IVPB
INTRAVENOUS | Status: AC
  Filled 2020-12-24: qty 100

## 2020-12-24 MED ORDER — SODIUM CHLORIDE 0.9% FLUSH
10.0000 mL | Freq: Once | INTRAVENOUS | Status: AC
  Administered 2020-12-24: 10 mL
  Filled 2020-12-24: qty 10

## 2020-12-24 MED ORDER — SODIUM CHLORIDE 0.9% FLUSH
10.0000 mL | Freq: Once | INTRAVENOUS | Status: AC
Start: 2020-12-24 — End: 2020-12-24
  Administered 2020-12-24: 10 mL
  Filled 2020-12-24: qty 10

## 2020-12-24 MED ORDER — DEXAMETHASONE 4 MG PO TABS: 4.0000 mg | ORAL_TABLET | Freq: Two times a day (BID) | ORAL | 0 refills | Status: DC

## 2020-12-24 MED ORDER — HEPARIN SOD (PORK) LOCK FLUSH 100 UNIT/ML IV SOLN
500.0000 [IU] | Freq: Once | INTRAVENOUS | Status: AC
Start: 2020-12-24 — End: 2020-12-24
  Administered 2020-12-24: 500 [IU]
  Filled 2020-12-24: qty 5

## 2020-12-24 MED ORDER — OLANZAPINE 10 MG PO TABS: 5.0000 mg | ORAL_TABLET | Freq: Every day | ORAL | 0 refills | Status: DC

## 2020-12-24 MED ORDER — PROCHLORPERAZINE EDISYLATE 10 MG/2ML IJ SOLN
10.0000 mg | Freq: Once | INTRAMUSCULAR | Status: AC
  Administered 2020-12-24: 10 mg via INTRAVENOUS

## 2020-12-24 MED ORDER — FAMOTIDINE 20 MG IN NS 100 ML IVPB
20.0000 mg | Freq: Once | INTRAVENOUS | Status: AC
  Administered 2020-12-24: 20 mg via INTRAVENOUS

## 2020-12-24 MED ORDER — OMEPRAZOLE 40 MG PO CPDR: 40.0000 mg | DELAYED_RELEASE_CAPSULE | Freq: Every day | ORAL | 1 refills | Status: DC

## 2020-12-24 NOTE — Progress Notes (Signed)
Pt came into the office today with complaint of severe nausea and vomiting x 4 days not relieved by Zofran or Compazine.  Per MD pt needing to have labs and see Wilber Bihari, NP for further evaluation. High priority message sent to scheduling.

## 2020-12-24 NOTE — Progress Notes (Signed)
[facility] Follow up:    Amber Nasuti, MD [address] [place] [address]   DIAGNOSIS: Cancer Staging Malignant neoplasm of upper-outer quadrant of right breast in female, estrogen receptor negative ([place]) Staging form: Breast, AJCC 8th Edition - Clinical: Stage IIIC (cT4, cN1, cM0, G3, ER-, PR-, HER2: Equivocal) - Signed by Nicholas Lose, MD on 11/07/2020 Histologic grading system: 3 grade system   SUMMARY OF ONCOLOGIC HISTORY: Oncology History  Malignant neoplasm of upper-outer quadrant of right breast in female, estrogen receptor negative ([place])  10/31/2020 Initial Diagnosis   Patient palpated a right breast mass x47mo Diagnostic mammogram and UKoreashowed an abnormality and calcifications in the right breast, 8.2cm, with skin thickening and right axillary adenopathy. 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   11/07/2020 Cancer Staging   Staging form: Breast, AJCC 8th Edition - Clinical: Stage IIIC (cT4, cN1, cM0, G3, ER-, PR-, HER2: Equivocal) - Signed by GNicholas Lose MD on 11/07/2020  Histologic grading system: 3 grade system    11/22/2020 -  Chemotherapy   Patient is on Treatment Plan: BREAST PEMBROLIZUMAB + AC Q21D X 4 CYCLES FOLLOWED BY PEMBROLIZUMAB + CARBOPLATIN D1 + PACLITAXEL D1,8,15 Q21D X 4 CYCLES        Genetic Testing   Negative genetic testing. No pathogenic variants identified on the [facility] panel. The report date is 11/28/2020.  The CancerNext-Expanded + RNAinsight gene panel offered by [employer] includes sequencing and rearrangement analysis for the following 77 genes: IP, ALK, APC*, ATM*, AXIN2, BAP1, BARD1, BLM, BMPR1A, BRCA1*, BRCA2*, BRIP1*, CDC73, CDH1*,CDK4, CDKN1B, CDKN2A, CHEK2*, CTNNA1, DICER1, FANCC, FH, FLCN, GALNT12, KIF1B, LZTR1, MAX, MEN1, MET, MLH1*, MSH2*, MSH3, MSH6*, MUTYH*, NBN, NF1*, NF2,  NTHL1, PALB2*, PHOX2B, PMS2*, POT1, PRKAR1A, PTCH1, PTEN*, RAD51C*, RAD51D*,RB1, RECQL, RET, SDHA, SDHAF2, SDHB, SDHC, SDHD, SMAD4, SMARCA4, SMARCB1, SMARCE1, STK11, SUFU, TMEM127, TP53*,TSC1, TSC2, VHL and XRCC2 (sequencing and deletion/duplication); EGFR, EGLN1, HOXB13, KIT, MITF, PDGFRA, POLD1 and POLE (sequencing only); EPCAM and GREM1 (deletion/duplication only).     CURRENT THERAPY: Adriamycin/Cytoxan/keytruda on day 1 of a 21 day cycle  INTERVAL HISTORY: EHabiba Treloar89y.o. female returns for urgent evaluation after receiving her second cycle of chemotherapy on 12/20/2020 (last Thursday).  After cycle 1 of her treatment she experienced Thrush, a maculopapular rash, significant nausea x 2 days, and diarrhea.  She has been having uncontrolled vomiting today, and she is very fatigued, and today it is the worse it has ever been.    Amber White notes nausea began on Thursday and has persisted.  She has been taking her anti nausea medications as prescribed, and her nausea has persisted.  She is having regular bowel movements most recently this morning.  She notes today has been her worst day in regards to vomiting.  The last anti nausea medication that she took was Zofran and she took that this morning.     Patient Active Problem List   Diagnosis Date Noted   Genetic testing 11/28/2020   Port-A-Cath in place 11/22/2020   Family history of breast cancer    Malignant neoplasm of upper-outer quadrant of right breast in female, estrogen receptor negative ([place] 11/06/2020   Pregnancy 02/19/2014   Morbid obesity ([place] 02/19/2014   Dermoid cyst of left ovary 02/19/2014    is allergic to penicillins.  MEDICAL HISTORY: Past Medical History:  Diagnosis Date   breast ca dx'd  10/2020   right   Family history of breast cancer    No pertinent past medical history     SURGICAL HISTORY: Past Surgical History:  Procedure Laterality Date   CHOLECYSTECTOMY     PORTACATH PLACEMENT N/A 11/21/2020    Procedure: INSERTION PORT-A-CATH;  Surgeon: Donnie Mesa, MD;  Location: [facility];  Service: General;  Laterality: N/A;    SOCIAL HISTORY: Social History   Socioeconomic History   Marital status: Single    Spouse name: Not on file   Number of children: Not on file   Years of education: Not on file   Highest education level: Not on file  Occupational History   Not on file  Tobacco Use   Smoking status: Never   Smokeless tobacco: Never  Vaping Use   Vaping Use: Never used  Substance and Sexual Activity   Alcohol use: Yes    Comment: rare   Drug use: No   Sexual activity: Yes    Birth control/protection: None  Other Topics Concern   Not on file  Social History Narrative   Not on file   Social Determinants of Health   Financial Resource Strain: Not on file  Food Insecurity: Not on file  Transportation Needs: Not on file  Physical Activity: Not on file  Stress: Not on file  Social Connections: Not on file  Intimate Partner Violence: Not on file    FAMILY HISTORY: Family History  Problem Relation Age of Onset   Breast cancer Mother    Diabetes Maternal Uncle    Diabetes Maternal Grandmother    Heart disease Maternal Grandmother     Review of Systems  Constitutional:  Negative for appetite change, chills, fatigue, fever and unexpected weight change.  HENT:   Negative for hearing loss, lump/mass and trouble swallowing.   Eyes:  Negative for eye problems and icterus.  Respiratory:  Negative for chest tightness, cough and shortness of breath.   Cardiovascular:  Negative for chest pain, leg swelling and palpitations.  Gastrointestinal:  Positive for nausea and vomiting. Negative for abdominal distention, abdominal pain, blood in stool, constipation, diarrhea and rectal pain.  Endocrine: Negative for hot flashes.  Genitourinary:  Negative for difficulty urinating.   Musculoskeletal:  Negative for arthralgias.  Skin:  Negative for itching and rash.   Neurological:  Negative for dizziness, extremity weakness, headaches and numbness.  Hematological:  Negative for adenopathy. Does not bruise/bleed easily.  Psychiatric/Behavioral:  Negative for depression. The patient is not nervous/anxious.      PHYSICAL EXAMINATION  ECOG PERFORMANCE STATUS: 2 - Symptomatic, <50% confined to bed  Vitals:   12/24/20 1121  BP: 128/82  Pulse: 96  Resp: 18  Temp: (!) 97.5 F (36.4 C)  SpO2: 100%    Physical Exam Constitutional:      General: She is not in acute distress.    Appearance: Normal appearance. She is not toxic-appearing.  HENT:     Head: Normocephalic and atraumatic.  Eyes:     General: No scleral icterus. Cardiovascular:     Rate and Rhythm: Normal rate and regular rhythm.     Pulses: Normal pulses.     Heart sounds: Normal heart sounds.  Pulmonary:     Effort: Pulmonary effort is normal.     Breath sounds: Normal breath sounds.  Abdominal:     General: Abdomen is flat. Bowel sounds are normal. There is no distension.     Palpations: Abdomen is soft.  Tenderness: There is no abdominal tenderness.     Comments: Vomited x 2 during visit today   Musculoskeletal:        General: No swelling.     Cervical back: Neck supple.  Lymphadenopathy:     Cervical: No cervical adenopathy.  Skin:    General: Skin is warm and dry.     Findings: No rash.  Neurological:     General: No focal deficit present.     Mental Status: She is alert.  Psychiatric:        Mood and Affect: Mood normal.        Behavior: Behavior normal.    LABORATORY DATA:  CBC    Component Value Date/Time   WBC 26.4 (H) 12/24/2020 0950   WBC 4.5 12/20/2020 0959   RBC 3.56 (L) 12/24/2020 0950   HGB 10.8 (L) 12/24/2020 0950   HCT 32.8 (L) 12/24/2020 0950   PLT 352 12/24/2020 0950   MCV 92.1 12/24/2020 0950   MCH 30.3 12/24/2020 0950   MCHC 32.9 12/24/2020 0950   RDW 14.2 12/24/2020 0950   LYMPHSABS 0.8 12/24/2020 0950   MONOABS 0.3 12/24/2020 0950    EOSABS 0.3 12/24/2020 0950   BASOSABS 0.0 12/24/2020 0950    CMP     Component Value Date/Time   NA 140 12/24/2020 0950   K 4.0 12/24/2020 0950   CL 108 12/24/2020 0950   CO2 24 12/24/2020 0950   GLUCOSE 103 (H) 12/24/2020 0950   BUN 13 12/24/2020 0950   CREATININE 0.67 12/24/2020 0950   CALCIUM 8.5 (L) 12/24/2020 0950   PROT 7.1 12/24/2020 0950   ALBUMIN 3.0 (L) 12/24/2020 0950   AST 9 (L) 12/24/2020 0950   ALT 8 12/24/2020 0950   ALKPHOS 83 12/24/2020 0950   BILITOT 0.4 12/24/2020 0950   GFRNONAA >60 12/24/2020 0950   GFRAA >90 02/19/2014 1100       PENDING LABS:   RADIOGRAPHIC STUDIES:  No results found.   PATHOLOGY:     ASSESSMENT and THERAPY PLAN:   Malignant neoplasm of upper-outer quadrant of right breast in female, estrogen receptor negative ([place]) 10/31/20: Patient palpated a right breast mass x4mo Diagnostic mammogram and UKoreashowed an abnormality and calcifications in the right breast, 8.2cm, with skin thickening and right axillary adenopathy. 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   Treatment plan: 1. Neoadjuvant chemotherapy with Adriamycin and Cytoxan Keytruda 4 followed by Taxol weekly 12 with carboplatin and Keytruda every 3 weeks x4 followed by 1 year of torted Keytruda maintenance 2. Followed by mastectomy and axillary lymph node dissection  3. Followed by adjuvant radiation therapy UR CC nausea study CT CAP 11/14/2020: Enlarged right axillary lymph nodes, prominent left axillary lymph node.  (Requires an ultrasound) no evidence of metastatic disease in the abdomen or pelvis Bone scan 11/16/2020: No evidence of bone metastases --------------------------------------------------------------------------------------------------------------------------------------------------------- Current treatment: Cycle 2 Adriamycin Cytoxan and  Keytruda Echocardiogram 11/19/2020: EF 70-75 %   Toxicities: 1. Maculopapular rash after cycle 1: completed medrol dosepak, resolved 2. Intractable nausea and vomiting to current anti nausea regimen: IV fluids with Compazine, Zyprexa and Pepcid today.  She will start Omeprazole daily in the morning.  I added Zyprexa to subsequent treatment cycles and adjusted her post chemo oral dexamethasone dosing per the preferred treatment option anti emetic combination on the NCCN guidelines.  I reviewed these changes with ESrahin detail and  gave her information in her AVS about them.    She will proceed with the above interventions which will hopefully help.  If she starts feeling poorly again she will call us.     All questions were answered. The patient knows to call the clinic with any problems, questions or concerns. We can certainly see the patient much sooner if necessary.  I reviewed the above with Dr. Lindi Adie after visit completion who is in agreement with the current plan and will see the patient prior to her next appointment to discuss further.    Total encounter time: 30 minutes* in face to face visit time, communication and collaboration with  nursing, with Dr. Lindi Adie, order entry, and documentation of the encounter.   Wilber Bihari, NP 12/24/20 1:42 PM Medical Oncology and Hematology [facility] [place], [place] [address] Tel. [phone]    Fax. [phone]  .l[facility]enc

## 2020-12-24 NOTE — Assessment & Plan Note (Addendum)
10/31/20:Patient palpated a right breast mass x35mo Diagnostic mammogram and UKoreashowed an abnormality and calcifications in the right breast, 8.2cm, with skin thickening and right axillary adenopathy. 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  Treatment plan: 1. Neoadjuvant chemotherapy with Adriamycin and CytoxanKeytruda4 followed by Taxol weekly 12 with carboplatinand Keytrudaevery 3 weeks x483flowed by 1 year of torted Keytruda maintenance 2. Followed bymastectomy and axillary lymph node dissection 3. Followed by adjuvant radiation therapy UR CC nausea study CT CAP 11/14/2020: Enlarged right axillary lymph nodes, prominent left axillary lymph node. (Requires an ultrasound) no evidence of metastatic disease in the abdomen or pelvis Bone scan 11/16/2020: No evidence of bone metastases --------------------------------------------------------------------------------------------------------------------------------------------------------- Current treatment:Cycle 2 Adriamycin Cytoxan and Keytruda Echocardiogram5/03/2021: EF70-75%  Toxicities: 1. Maculopapular rash after cycle 1: completed medrol dosepak, resolved 2. Intractable nausea and vomiting to current anti nausea regimen: IV fluids with Compazine, Zyprexa and Pepcid today.  She will start Omeprazole daily in the morning.  I added Zyprexa to subsequent treatment cycles and adjusted her post chemo oral dexamethasone dosing per the preferred treatment option anti emetic combination on the NCCN guidelines.  I reviewed these changes with ErEstephanin detail and gave her information in her AVS about them.    She will proceed with the above interventions which will hopefully help.  If she starts feeling poorly again she will call [place]

## 2020-12-24 NOTE — Patient Instructions (Signed)

## 2020-12-25 ENCOUNTER — Other Ambulatory Visit: Payer: No Typology Code available for payment source

## 2020-12-26 ENCOUNTER — Telehealth: Payer: Self-pay | Admitting: Adult Health

## 2020-12-26 NOTE — Telephone Encounter (Signed)
Per 6/13 los, no new schedule changes

## 2021-01-03 ENCOUNTER — Ambulatory Visit: Payer: No Typology Code available for payment source | Admitting: Hematology and Oncology

## 2021-01-03 ENCOUNTER — Ambulatory Visit: Payer: No Typology Code available for payment source

## 2021-01-03 ENCOUNTER — Other Ambulatory Visit: Payer: No Typology Code available for payment source

## 2021-01-05 ENCOUNTER — Ambulatory Visit: Payer: No Typology Code available for payment source

## 2021-01-08 ENCOUNTER — Encounter: Payer: Self-pay | Admitting: Licensed Clinical Social Worker

## 2021-01-08 NOTE — Progress Notes (Signed)
[place] [employer]  [employer] representative received TC from patient asking about financial assistance options (has already been approved for the [employer]) due to missing work for treatment.  CSW provided information on breast cancer foundations and e-mailed to patient per her request. Also gave information on potential referral to [facility] to help with applying for disability if patient is interested.     [place], [place] Worker [employer]

## 2021-01-09 NOTE — Progress Notes (Signed)
Patient Care Team: Hague, Rosalyn Charters, MD as PCP - General (Internal Medicine) Rockwell Germany, RN as Oncology Nurse Navigator Mauro Kaufmann, RN as Oncology Nurse Navigator Donnie Mesa, MD as Consulting Physician (General Surgery) Nicholas Lose, MD as Consulting Physician (Hematology and Oncology) Gery Pray, MD as Consulting Physician (Radiation Oncology)  DIAGNOSIS:    ICD-10-CM   1. Malignant neoplasm of upper-outer quadrant of right breast in female, estrogen receptor negative ([place])  C50.411    Z17.1       SUMMARY OF ONCOLOGIC HISTORY: Oncology History  Malignant neoplasm of upper-outer quadrant of right breast in female, estrogen receptor negative ([place])  10/31/2020 Initial Diagnosis   Patient palpated a right breast mass x73mo Diagnostic mammogram and UKoreashowed an abnormality and calcifications in the right breast, 8.2cm, with skin thickening and right axillary adenopathy. 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   11/07/2020 Cancer Staging   Staging form: Breast, AJCC 8th Edition - Clinical: Stage IIIC (cT4, cN1, cM0, G3, ER-, PR-, HER2: Equivocal) - Signed by GNicholas Lose MD on 11/07/2020  Histologic grading system: 3 grade system    11/22/2020 -  Chemotherapy   Patient is on Treatment Plan: BREAST PEMBROLIZUMAB + AC Q21D X 4 CYCLES FOLLOWED BY PEMBROLIZUMAB + CARBOPLATIN D1 + PACLITAXEL D1,8,15 Q21D X 4 CYCLES        Genetic Testing   Negative genetic testing. No pathogenic variants identified on the [facility] panel. The report date is 11/28/2020.  The CancerNext-Expanded + RNAinsight gene panel offered by [employer] includes sequencing and rearrangement analysis for the following 77 genes: IP, ALK, APC*, ATM*, AXIN2, BAP1, BARD1, BLM, BMPR1A, BRCA1*, BRCA2*, BRIP1*, CDC73, CDH1*,CDK4, CDKN1B, CDKN2A, CHEK2*, CTNNA1, DICER1, FANCC, FH, FLCN,  GALNT12, KIF1B, LZTR1, MAX, MEN1, MET, MLH1*, MSH2*, MSH3, MSH6*, MUTYH*, NBN, NF1*, NF2, NTHL1, PALB2*, PHOX2B, PMS2*, POT1, PRKAR1A, PTCH1, PTEN*, RAD51C*, RAD51D*,RB1, RECQL, RET, SDHA, SDHAF2, SDHB, SDHC, SDHD, SMAD4, SMARCA4, SMARCB1, SMARCE1, STK11, SUFU, TMEM127, TP53*,TSC1, TSC2, VHL and XRCC2 (sequencing and deletion/duplication); EGFR, EGLN1, HOXB13, KIT, MITF, PDGFRA, POLD1 and POLE (sequencing only); EPCAM and GREM1 (deletion/duplication only).     CHIEF COMPLIANT: Cycle 3 Day 1 Adriamycin and Cytoxan Keytruda  INTERVAL HISTORY: ESita Mangenis a 45y.o. with above-mentioned history of right breast cancer currently on neoadjuvant chemotherapy with Adriamycin and Cytoxan Keytruda. She presents to the clinic today for a toxicity check and cycle 3.   She had profound nausea fatigue and rash after the last cycle of chemo.  Lasted for 45 days.  She was really struggling to eat or drink.  She had severe acid reflux for which she was started on Protonix but it still bothering her.  She has a lump in the throat feeling.  She has lost few pounds since her last treatment.  Over the last week she has felt significantly better so she has gained some weight back.  ALLERGIES:  is allergic to penicillins.  MEDICATIONS:  Current Outpatient Medications  Medication Sig Dispense Refill   dexamethasone (DECADRON) 4 MG tablet Take 1 tablet (4 mg total) by mouth 2 (two) times daily. Start the day after chemo and take twice a day for three days 10 tablet 0   fluconazole (DIFLUCAN) 100 MG tablet Take 1 tablet (100 mg total) by mouth daily. 14 tablet 1   lidocaine-prilocaine (EMLA) cream Apply to affected area once 30 g 3  OLANZapine (ZYPREXA) 10 MG tablet Take 0.5 tablets (5 mg total) by mouth at bedtime. Start the day after chemo and take for three days 30 tablet 0   omeprazole (PRILOSEC) 40 MG capsule Take 1 capsule (40 mg total) by mouth daily. 30 capsule 1   ondansetron (ZOFRAN) 8 MG tablet Take 1 tablet  (8 mg total) by mouth 2 (two) times daily as needed. Start on the third day after carboplatin and AC chemotherapy. 30 tablet 1   prochlorperazine (COMPAZINE) 10 MG tablet Take 1 tablet (10 mg total) by mouth every 6 (six) hours as needed (Nausea or vomiting). 30 tablet 1   No current facility-administered medications for this visit.    PHYSICAL EXAMINATION: ECOG PERFORMANCE STATUS: 1 - Symptomatic but completely ambulatory  Vitals:   01/10/21 0847  BP: 130/82  Pulse: 99  Resp: 18  Temp: 97.6 F (36.4 C)  SpO2: 100%   Filed Weights   01/10/21 0847  Weight: 273 lb 5 oz (124 kg)     LABORATORY DATA:  I have reviewed the data as listed CMP Latest Ref Rng & Units 12/24/2020 12/20/2020 11/30/2020  Glucose 70 - 99 mg/dL 103(H) 94 115(H)  BUN 6 - 20 mg/dL '13 11 10  ' Creatinine 0.44 - 1.00 mg/dL 0.67 0.70 0.70  Sodium 135 - 145 mmol/L 140 138 140  Potassium 3.5 - 5.1 mmol/L 4.0 3.9 3.8  Chloride 98 - 111 mmol/L 108 108 108  CO2 22 - 32 mmol/L '24 25 24  ' Calcium 8.9 - 10.3 mg/dL 8.5(L) 9.0 8.9  Total Protein 6.5 - 8.1 g/dL 7.1 7.1 7.0  Total Bilirubin 0.3 - 1.2 mg/dL 0.4 0.4 <0.2(L)  Alkaline Phos 38 - 126 U/L 83 54 89  AST 15 - 41 U/L 9(L) 12(L) 15  ALT 0 - 44 U/L '8 9 13    ' Lab Results  Component Value Date   WBC 8.1 01/10/2021   HGB 10.0 (L) 01/10/2021   HCT 30.0 (L) 01/10/2021   MCV 90.4 01/10/2021   PLT 463 (H) 01/10/2021   NEUTROABS 5.5 01/10/2021    ASSESSMENT & PLAN:  Malignant neoplasm of upper-outer quadrant of right breast in female, estrogen receptor negative ([place]) 10/31/20: Patient palpated a right breast mass x59mo Diagnostic mammogram and UKoreashowed an abnormality and calcifications in the right breast, 8.2cm, with skin thickening and right axillary adenopathy. 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   Treatment plan: 1. Neoadjuvant chemotherapy with Adriamycin and  Cytoxan Keytruda 4 followed by Taxol weekly 12 with carboplatin and Keytruda every 3 weeks x4 followed by 1 year of torted Keytruda maintenance 2. Followed by mastectomy and axillary lymph node dissection  3. Followed by adjuvant radiation therapy UR CC nausea study CT CAP 11/14/2020: Enlarged right axillary lymph nodes, prominent left axillary lymph node.  (Requires an ultrasound) no evidence of metastatic disease in the abdomen or pelvis Bone scan 11/16/2020: No evidence of bone metastases --------------------------------------------------------------------------------------------------------------------------------------------------------- Current treatment: Cycle 3 Adriamycin Cytoxan and Keytruda Echocardiogram 11/19/2020: EF 70-75 %   Toxicities: 1. Maculopapular rash: This happened after each treatment.  It responds to steroids.  I gave her 5 days of prednisone 20 mg. 2. Intractable nausea and vomiting to current anti nausea regimen: I decreased the dosage of chemotherapy today. 3.  Thrush: I sent another prescription for Diflucan. 4.  Severe fatigue  Return to clinic in 3 weeks for cycle 4  No orders of the defined types were placed in this encounter.  The patient has a good understanding of the overall plan. she agrees with it. she will call with any problems that may develop before the next visit here.  Total time spent: 30 mins including face to face time and time spent for planning, charting and coordination of care  Rulon Eisenmenger, MD, MPH 01/10/2021  I, Reinaldo Raddle, am acting as scribe for Dr. Nicholas Lose, MD.

## 2021-01-10 ENCOUNTER — Inpatient Hospital Stay: Payer: No Typology Code available for payment source

## 2021-01-10 ENCOUNTER — Inpatient Hospital Stay: Payer: No Typology Code available for payment source | Attending: Hematology and Oncology

## 2021-01-10 ENCOUNTER — Other Ambulatory Visit: Payer: Self-pay

## 2021-01-10 ENCOUNTER — Inpatient Hospital Stay (HOSPITAL_BASED_OUTPATIENT_CLINIC_OR_DEPARTMENT_OTHER): Payer: No Typology Code available for payment source | Admitting: Hematology and Oncology

## 2021-01-10 DIAGNOSIS — Z171 Estrogen receptor negative status [ER-]: Secondary | ICD-10-CM

## 2021-01-10 DIAGNOSIS — C50411 Malignant neoplasm of upper-outer quadrant of right female breast: Secondary | ICD-10-CM

## 2021-01-10 DIAGNOSIS — Z95828 Presence of other vascular implants and grafts: Secondary | ICD-10-CM

## 2021-01-10 DIAGNOSIS — Z5112 Encounter for antineoplastic immunotherapy: Secondary | ICD-10-CM | POA: Diagnosis not present

## 2021-01-10 LAB — CBC WITH DIFFERENTIAL/PLATELET
Abs Immature Granulocytes: 0.05 10*3/uL (ref 0.00–0.07)
Basophils Absolute: 0 10*3/uL (ref 0.0–0.1)
Basophils Relative: 0 %
Eosinophils Absolute: 0 10*3/uL (ref 0.0–0.5)
Eosinophils Relative: 0 %
HCT: 30 % — ABNORMAL LOW (ref 36.0–46.0)
Hemoglobin: 10 g/dL — ABNORMAL LOW (ref 12.0–15.0)
Immature Granulocytes: 1 %
Lymphocytes Relative: 23 %
Lymphs Abs: 1.9 10*3/uL (ref 0.7–4.0)
MCH: 30.1 pg (ref 26.0–34.0)
MCHC: 33.3 g/dL (ref 30.0–36.0)
MCV: 90.4 fL (ref 80.0–100.0)
Monocytes Absolute: 0.6 10*3/uL (ref 0.1–1.0)
Monocytes Relative: 8 %
Neutro Abs: 5.5 10*3/uL (ref 1.7–7.7)
Neutrophils Relative %: 68 %
Platelets: 463 10*3/uL — ABNORMAL HIGH (ref 150–400)
RBC: 3.32 MIL/uL — ABNORMAL LOW (ref 3.87–5.11)
RDW: 15 % (ref 11.5–15.5)
WBC: 8.1 10*3/uL (ref 4.0–10.5)
nRBC: 0 % (ref 0.0–0.2)

## 2021-01-10 LAB — COMPREHENSIVE METABOLIC PANEL
ALT: 7 U/L (ref 0–44)
AST: 10 U/L — ABNORMAL LOW (ref 15–41)
Albumin: 3.2 g/dL — ABNORMAL LOW (ref 3.5–5.0)
Alkaline Phosphatase: 66 U/L (ref 38–126)
Anion gap: 9 (ref 5–15)
BUN: 13 mg/dL (ref 6–20)
CO2: 21 mmol/L — ABNORMAL LOW (ref 22–32)
Calcium: 8.9 mg/dL (ref 8.9–10.3)
Chloride: 109 mmol/L (ref 98–111)
Creatinine, Ser: 0.74 mg/dL (ref 0.44–1.00)
GFR, Estimated: >60 mL/min (ref >60)
Glucose, Bld: 131 mg/dL — ABNORMAL HIGH (ref 70–99)
Potassium: 3.5 mmol/L (ref 3.5–5.1)
Sodium: 139 mmol/L (ref 135–145)
Total Bilirubin: <0.2 mg/dL — ABNORMAL LOW (ref 0.3–1.2)
Total Protein: 7.4 g/dL (ref 6.5–8.1)

## 2021-01-10 LAB — TSH: TSH: 0.322 u[IU]/mL (ref 0.308–3.960)

## 2021-01-10 MED ORDER — SODIUM CHLORIDE 0.9 % IV SOLN
200.0000 mg | Freq: Once | INTRAVENOUS | Status: AC
  Administered 2021-01-10: 200 mg via INTRAVENOUS
  Filled 2021-01-10: qty 8

## 2021-01-10 MED ORDER — SODIUM CHLORIDE 0.9 % IV SOLN
10.0000 mg | Freq: Once | INTRAVENOUS | Status: AC
  Administered 2021-01-10: 10 mg via INTRAVENOUS
  Filled 2021-01-10: qty 10

## 2021-01-10 MED ORDER — PALONOSETRON HCL INJECTION 0.25 MG/5ML
INTRAVENOUS | Status: AC
  Filled 2021-01-10: qty 5

## 2021-01-10 MED ORDER — HEPARIN SOD (PORK) LOCK FLUSH 100 UNIT/ML IV SOLN
500.0000 [IU] | Freq: Once | INTRAVENOUS | Status: AC | PRN
  Administered 2021-01-10: 500 [IU]
  Filled 2021-01-10: qty 5

## 2021-01-10 MED ORDER — SODIUM CHLORIDE 0.9 % IV SOLN
400.0000 mg/m2 | Freq: Once | INTRAVENOUS | Status: AC
  Administered 2021-01-10: 960 mg via INTRAVENOUS
  Filled 2021-01-10: qty 48

## 2021-01-10 MED ORDER — SODIUM CHLORIDE 0.9 % IV SOLN
Freq: Once | INTRAVENOUS | Status: AC
Start: 2021-01-10 — End: 2021-01-10
  Filled 2021-01-10: qty 250

## 2021-01-10 MED ORDER — SODIUM CHLORIDE 0.9 % IV SOLN
150.0000 mg | Freq: Once | INTRAVENOUS | Status: AC
  Administered 2021-01-10: 150 mg via INTRAVENOUS
  Filled 2021-01-10: qty 150

## 2021-01-10 MED ORDER — SODIUM CHLORIDE 0.9% FLUSH
10.0000 mL | Freq: Once | INTRAVENOUS | Status: AC
  Administered 2021-01-10: 10 mL
  Filled 2021-01-10: qty 10

## 2021-01-10 MED ORDER — OLANZAPINE 5 MG PO TABS
5.0000 mg | ORAL_TABLET | Freq: Once | ORAL | Status: AC
  Administered 2021-01-10: 5 mg via ORAL
  Filled 2021-01-10: qty 1

## 2021-01-10 MED ORDER — PREDNISONE 20 MG PO TABS: 20.0000 mg | ORAL_TABLET | Freq: Every day | ORAL | 0 refills | Status: DC

## 2021-01-10 MED ORDER — PALONOSETRON HCL INJECTION 0.25 MG/5ML
0.2500 mg | Freq: Once | INTRAVENOUS | Status: AC
Start: 2021-01-10 — End: 2021-01-10
  Administered 2021-01-10: 0.25 mg via INTRAVENOUS

## 2021-01-10 MED ORDER — DOXORUBICIN HCL CHEMO IV INJECTION 2 MG/ML
40.0000 mg/m2 | Freq: Once | INTRAVENOUS | Status: AC
  Administered 2021-01-10: 96 mg via INTRAVENOUS
  Filled 2021-01-10: qty 48

## 2021-01-10 MED ORDER — SODIUM CHLORIDE 0.9% FLUSH
10.0000 mL | INTRAVENOUS | Status: DC | PRN
  Administered 2021-01-10: 10 mL
  Filled 2021-01-10: qty 10

## 2021-01-10 NOTE — Patient Instructions (Signed)
[facility] ONCOLOG[facility]  Discharge Instructions: Thank you for choosing [facility] to provide your oncology and hematology care.   If you have a lab appointment with the [facility], please go directly to the [facility] and check in at the registration area.   Wear comfortable clothing and clothing appropriate for easy access to any Portacath or PICC line.   We strive to give you quality time with your provider. You may need to reschedule your appointment if you arrive late (15 or more minutes).  Arriving late affects you and other patients whose appointments are after yours.  Also, if you miss three or more appointments without notifying the office, you may be dismissed from the clinic at the provider's discretion.      For prescription refill requests, have your pharmacy contact our office and allow 72 hours for refills to be completed.    Today you received the following chemotherapy and/or immunotherapy agents: Keytruda, Adriamycin, Cytoxan    To help prevent nausea and vomiting after your treatment, we encourage you to take your nausea medication as directed.  BELOW ARE SYMPTOMS THAT SHOULD BE REPORTED IMMEDIATELY: *FEVER GREATER THAN 100.4 F (38 C) OR HIGHER *CHILLS OR SWEATING *NAUSEA AND VOMITING THAT IS NOT CONTROLLED WITH YOUR NAUSEA MEDICATION *UNUSUAL SHORTNESS OF BREATH *UNUSUAL BRUISING OR BLEEDING *URINARY PROBLEMS (pain or burning when urinating, or frequent urination) *BOWEL PROBLEMS (unusual diarrhea, constipation, pain near the anus) TENDERNESS IN MOUTH AND THROAT WITH OR WITHOUT PRESENCE OF ULCERS (sore throat, sores in mouth, or a toothache) UNUSUAL RASH, SWELLING OR PAIN  UNUSUAL VAGINAL DISCHARGE OR ITCHING   Items with * indicate a potential emergency and should be followed up as soon as possible or go to the Emergency Department if any problems should occur.  Please show the CHEMOTHERAPY ALERT CARD or IMMUNOTHERAPY ALERT  CARD at check-in to the Emergency Department and triage nurse.  Should you have questions after your visit or need to cancel or reschedule your appointment, please contact [facility]  Dept: [phone]  and follow the prompts.  Office hours are 8:00 a.m. to 4:30 p.m. Monday - Friday. Please note that voicemails left after 4:00 p.m. may not be returned until the following business day.  We are closed weekends and major holidays. You have access to a nurse at all times for urgent questions. Please call the main number to the clinic Dept: [phone] and follow the prompts.   For any non-urgent questions, you may also contact your provider using MyChart. We now offer e-Visits for anyone 74 and older to request care online for non-urgent symptoms. For details visit [email or web address].   Also download the MyChart app! Go to the app store, search "MyChart", open the app, select [facility], and log in with your MyChart username and password.  Due to Covid, a mask is required upon entering the hospital/clinic. If you do not have a mask, one will be given to you upon arrival. For doctor visits, patients may have 1 support person aged 26 or older with them. For treatment visits, patients cannot have anyone with them due to current Covid guidelines and our immunocompromised population.

## 2021-01-10 NOTE — Assessment & Plan Note (Signed)
10/31/20:Patient palpated a right breast mass x29mo Diagnostic mammogram and UKoreashowed an abnormality and calcifications in the right breast, 8.2cm, with skin thickening and right axillary adenopathy. 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  Treatment plan: 1. Neoadjuvant chemotherapy with Adriamycin and CytoxanKeytruda4 followed by Taxol weekly 12 with carboplatinand Keytrudaevery 3 weeks x431flowed by 1 year of torted Keytruda maintenance 2. Followed bymastectomy and axillary lymph node dissection 3. Followed by adjuvant radiation therapy UR CC nausea study CT CAP 11/14/2020: Enlarged right axillary lymph nodes, prominent left axillary lymph node. (Requires an ultrasound) no evidence of metastatic disease in the abdomen or pelvis Bone scan 11/16/2020: No evidence of bone metastases --------------------------------------------------------------------------------------------------------------------------------------------------------- Current treatment:Cycle 3Adriamycin Cytoxan and Keytruda Echocardiogram5/03/2021: EF70-75%  Toxicities: 1. Maculopapular rash after cycle 1: completed medrol dosepak, resolved 2. Intractable nausea and vomiting to current anti nausea regimen  Return to clinic in 3 weeks for cycle 2

## 2021-01-11 LAB — T4: T4, Total: 6.3 ug/dL (ref 4.5–12.0)

## 2021-01-12 ENCOUNTER — Inpatient Hospital Stay: Payer: No Typology Code available for payment source | Attending: Hematology and Oncology

## 2021-01-12 ENCOUNTER — Other Ambulatory Visit: Payer: Self-pay

## 2021-01-12 VITALS — BP 123/82 | HR 99 | Temp 98.7°F | Resp 20 | Ht 64.0 in

## 2021-01-12 DIAGNOSIS — Z5112 Encounter for antineoplastic immunotherapy: Secondary | ICD-10-CM | POA: Diagnosis present

## 2021-01-12 DIAGNOSIS — C50411 Malignant neoplasm of upper-outer quadrant of right female breast: Secondary | ICD-10-CM | POA: Diagnosis not present

## 2021-01-12 DIAGNOSIS — Z5189 Encounter for other specified aftercare: Secondary | ICD-10-CM | POA: Diagnosis not present

## 2021-01-12 DIAGNOSIS — Z171 Estrogen receptor negative status [ER-]: Secondary | ICD-10-CM | POA: Diagnosis not present

## 2021-01-12 DIAGNOSIS — Z79899 Other long term (current) drug therapy: Secondary | ICD-10-CM | POA: Diagnosis not present

## 2021-01-12 DIAGNOSIS — C773 Secondary and unspecified malignant neoplasm of axilla and upper limb lymph nodes: Secondary | ICD-10-CM | POA: Diagnosis not present

## 2021-01-12 DIAGNOSIS — Z5111 Encounter for antineoplastic chemotherapy: Secondary | ICD-10-CM | POA: Insufficient documentation

## 2021-01-12 MED ORDER — PEGFILGRASTIM-BMEZ 6 MG/0.6ML ~~LOC~~ SOSY
6.0000 mg | PREFILLED_SYRINGE | Freq: Once | SUBCUTANEOUS | Status: AC
  Administered 2021-01-12: 6 mg via SUBCUTANEOUS

## 2021-01-12 NOTE — Patient Instructions (Signed)

## 2021-01-17 ENCOUNTER — Other Ambulatory Visit: Payer: Self-pay | Admitting: Adult Health

## 2021-01-17 DIAGNOSIS — C50411 Malignant neoplasm of upper-outer quadrant of right female breast: Secondary | ICD-10-CM

## 2021-01-24 ENCOUNTER — Ambulatory Visit: Payer: No Typology Code available for payment source

## 2021-01-24 ENCOUNTER — Ambulatory Visit: Payer: No Typology Code available for payment source | Admitting: Hematology and Oncology

## 2021-01-24 ENCOUNTER — Other Ambulatory Visit: Payer: No Typology Code available for payment source

## 2021-01-26 ENCOUNTER — Ambulatory Visit: Payer: No Typology Code available for payment source

## 2021-01-30 NOTE — Progress Notes (Signed)
Patient Care Team: Hague, Rosalyn Charters, MD as PCP - General (Internal Medicine) Rockwell Germany, RN as Oncology Nurse Navigator Mauro Kaufmann, RN as Oncology Nurse Navigator Donnie Mesa, MD as Consulting Physician (General Surgery) Nicholas Lose, MD as Consulting Physician (Hematology and Oncology) Gery Pray, MD as Consulting Physician (Radiation Oncology)  DIAGNOSIS:    ICD-10-CM   1. Malignant neoplasm of upper-outer quadrant of right breast in female, estrogen receptor negative ([place])  C50.411    Z17.1       SUMMARY OF ONCOLOGIC HISTORY: Oncology History  Malignant neoplasm of upper-outer quadrant of right breast in female, estrogen receptor negative ([place])  10/31/2020 Initial Diagnosis   Patient palpated a right breast mass x13mo Diagnostic mammogram and UKoreashowed an abnormality and calcifications in the right breast, 8.2cm, with skin thickening and right axillary adenopathy. 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   11/07/2020 Cancer Staging   Staging form: Breast, AJCC 8th Edition - Clinical: Stage IIIC (cT4, cN1, cM0, G3, ER-, PR-, HER2: Equivocal) - Signed by GNicholas Lose MD on 11/07/2020  Histologic grading system: 3 grade system    11/22/2020 -  Chemotherapy   Patient is on Treatment Plan: BREAST PEMBROLIZUMAB + AC Q21D X 4 CYCLES FOLLOWED BY PEMBROLIZUMAB + CARBOPLATIN D1 + PACLITAXEL D1,8,15 Q21D X 4 CYCLES        Genetic Testing   Negative genetic testing. No pathogenic variants identified on the [facility] panel. The report date is 11/28/2020.  The CancerNext-Expanded + RNAinsight gene panel offered by [employer] includes sequencing and rearrangement analysis for the following 77 genes: IP, ALK, APC*, ATM*, AXIN2, BAP1, BARD1, BLM, BMPR1A, BRCA1*, BRCA2*, BRIP1*, CDC73, CDH1*,CDK4, CDKN1B, CDKN2A, CHEK2*, CTNNA1, DICER1, FANCC, FH, FLCN,  GALNT12, KIF1B, LZTR1, MAX, MEN1, MET, MLH1*, MSH2*, MSH3, MSH6*, MUTYH*, NBN, NF1*, NF2, NTHL1, PALB2*, PHOX2B, PMS2*, POT1, PRKAR1A, PTCH1, PTEN*, RAD51C*, RAD51D*,RB1, RECQL, RET, SDHA, SDHAF2, SDHB, SDHC, SDHD, SMAD4, SMARCA4, SMARCB1, SMARCE1, STK11, SUFU, TMEM127, TP53*,TSC1, TSC2, VHL and XRCC2 (sequencing and deletion/duplication); EGFR, EGLN1, HOXB13, KIT, MITF, PDGFRA, POLD1 and POLE (sequencing only); EPCAM and GREM1 (deletion/duplication only).     CHIEF COMPLIANT: Cycle 4  Adriamycin and Cytoxan Keytruda  INTERVAL HISTORY: Amber Mcbrienis a 45y.o. with above-mentioned history of right breast cancer currently on neoadjuvant chemotherapy with Adriamycin and Cytoxan Keytruda. She presents to the clinic today for a toxicity check and cycle 4.  Her major complaint is profound nausea and vomiting for 4 days after each chemotherapy.  This is in spite of lowering the dosage of her treatment and instituting Zyprexa to her nausea regimen.  Other than that she is complaining of fatigue and tiredness.  No other major side effects.  ALLERGIES:  is allergic to penicillins.  MEDICATIONS:  Current Outpatient Medications  Medication Sig Dispense Refill   dexamethasone (DECADRON) 4 MG tablet Take 1 tablet (4 mg total) by mouth 2 (two) times daily. Start the day after chemo and take twice a day for three days 10 tablet 0   fluconazole (DIFLUCAN) 100 MG tablet Take 1 tablet (100 mg total) by mouth daily. 14 tablet 1   lidocaine-prilocaine (EMLA) cream Apply to affected area once 30 g 3   OLANZapine (ZYPREXA) 10 MG tablet Take 0.5 tablets (5 mg total) by mouth at bedtime. Start the day after chemo and take for three days 30 tablet 0   omeprazole (PRILOSEC) 40  MG capsule TAKE 1 CAPSULE (40 MG TOTAL) BY MOUTH DAILY. 30 capsule 1   ondansetron (ZOFRAN) 8 MG tablet Take 1 tablet (8 mg total) by mouth 2 (two) times daily as needed. Start on the third day after carboplatin and AC chemotherapy. 30 tablet 1    predniSONE (DELTASONE) 20 MG tablet Take 1 tablet (20 mg total) by mouth daily with breakfast. 20 tablet 0   prochlorperazine (COMPAZINE) 10 MG tablet Take 1 tablet (10 mg total) by mouth every 6 (six) hours as needed (Nausea or vomiting). 30 tablet 1   No current facility-administered medications for this visit.    PHYSICAL EXAMINATION: ECOG PERFORMANCE STATUS: 1 - Symptomatic but completely ambulatory  Vitals:   01/31/21 0904  BP: 131/85  Pulse: 100  Resp: 18  Temp: 98 F (36.7 C)  SpO2: 100%   Filed Weights   01/31/21 0904  Weight: 276 lb 3.2 oz (125.3 kg)    LABORATORY DATA:  I have reviewed the data as listed CMP Latest Ref Rng & Units 01/10/2021 12/24/2020 12/20/2020  Glucose 70 - 99 mg/dL 131(H) 103(H) 94  BUN 6 - 20 mg/dL '13 13 11  ' Creatinine 0.44 - 1.00 mg/dL 0.74 0.67 0.70  Sodium 135 - 145 mmol/L 139 140 138  Potassium 3.5 - 5.1 mmol/L 3.5 4.0 3.9  Chloride 98 - 111 mmol/L 109 108 108  CO2 22 - 32 mmol/L 21(L) 24 25  Calcium 8.9 - 10.3 mg/dL 8.9 8.5(L) 9.0  Total Protein 6.5 - 8.1 g/dL 7.4 7.1 7.1  Total Bilirubin 0.3 - 1.2 mg/dL <0.2(L) 0.4 0.4  Alkaline Phos 38 - 126 U/L 66 83 54  AST 15 - 41 U/L 10(L) 9(L) 12(L)  ALT 0 - 44 U/L '7 8 9    ' Lab Results  Component Value Date   WBC 8.6 01/31/2021   HGB 10.2 (L) 01/31/2021   HCT 30.7 (L) 01/31/2021   MCV 91.1 01/31/2021   PLT 375 01/31/2021   NEUTROABS 5.6 01/31/2021    ASSESSMENT & PLAN:  Malignant neoplasm of upper-outer quadrant of right breast in female, estrogen receptor negative ([place]) 10/31/20: Patient palpated a right breast mass x89mo Diagnostic mammogram and UKoreashowed an abnormality and calcifications in the right breast, 8.2cm, with skin thickening and right axillary adenopathy. 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   Treatment plan: 1. Neoadjuvant chemotherapy with Adriamycin and Cytoxan Keytruda 4  followed by Taxol weekly 12 with carboplatin and Keytruda every 3 weeks x4 followed by 1 year of torted Keytruda maintenance 2. Followed by mastectomy and axillary lymph node dissection  3. Followed by adjuvant radiation therapy UR CC nausea study CT CAP 11/14/2020: Enlarged right axillary lymph nodes, prominent left axillary lymph node.  (Requires an ultrasound) no evidence of metastatic disease in the abdomen or pelvis Bone scan 11/16/2020: No evidence of bone metastases --------------------------------------------------------------------------------------------------------------------------------------------------------- Current treatment: Cycle 4 Adriamycin Cytoxan and Keytruda Echocardiogram 11/19/2020: EF 70-75 %   Toxicities: 1. Maculopapular rash: This happened after each treatment.  It responds to steroids.  I gave her 5 days of prednisone 20 mg. 2. Intractable nausea and vomiting to current anti nausea regimen: This is in spite of reducing the dosage of chemotherapy and adding Zyprexa to her nausea prevention regimen.. 3.  Severe fatigue 4.  Emotional distress: Patient is very frustrated about how poorly she feels after each chemo and gets very anxious coming into  the infusions.  She is looking forward to completing this type of treatment and coming back in 3 weeks to start the next phase in her chemotherapy regimen.   Return to clinic in 3 weeks for cycle 1 Taxol carboplatin Keytruda    No orders of the defined types were placed in this encounter.  The patient has a good understanding of the overall plan. she agrees with it. she will call with any problems that may develop before the next visit here.  Total time spent: 30 mins including face to face time and time spent for planning, charting and coordination of care  Rulon Eisenmenger, MD, MPH 01/31/2021  I, Thana Ates, am acting as scribe for Dr. Nicholas Lose.  I have reviewed the above documentation for accuracy and  completeness, and I agree with the above.

## 2021-01-31 ENCOUNTER — Other Ambulatory Visit: Payer: Self-pay

## 2021-01-31 ENCOUNTER — Encounter: Payer: Self-pay | Admitting: *Deleted

## 2021-01-31 ENCOUNTER — Inpatient Hospital Stay: Payer: No Typology Code available for payment source

## 2021-01-31 ENCOUNTER — Inpatient Hospital Stay (HOSPITAL_BASED_OUTPATIENT_CLINIC_OR_DEPARTMENT_OTHER): Payer: No Typology Code available for payment source | Admitting: Hematology and Oncology

## 2021-01-31 DIAGNOSIS — C50411 Malignant neoplasm of upper-outer quadrant of right female breast: Secondary | ICD-10-CM

## 2021-01-31 DIAGNOSIS — Z171 Estrogen receptor negative status [ER-]: Secondary | ICD-10-CM

## 2021-01-31 DIAGNOSIS — Z5112 Encounter for antineoplastic immunotherapy: Secondary | ICD-10-CM | POA: Diagnosis not present

## 2021-01-31 DIAGNOSIS — Z95828 Presence of other vascular implants and grafts: Secondary | ICD-10-CM

## 2021-01-31 LAB — CBC WITH DIFFERENTIAL/PLATELET
Abs Immature Granulocytes: 0.05 10*3/uL (ref 0.00–0.07)
Basophils Absolute: 0 10*3/uL (ref 0.0–0.1)
Basophils Relative: 0 %
Eosinophils Absolute: 0 10*3/uL (ref 0.0–0.5)
Eosinophils Relative: 0 %
HCT: 30.7 % — ABNORMAL LOW (ref 36.0–46.0)
Hemoglobin: 10.2 g/dL — ABNORMAL LOW (ref 12.0–15.0)
Immature Granulocytes: 1 %
Lymphocytes Relative: 24 %
Lymphs Abs: 2 10*3/uL (ref 0.7–4.0)
MCH: 30.3 pg (ref 26.0–34.0)
MCHC: 33.2 g/dL (ref 30.0–36.0)
MCV: 91.1 fL (ref 80.0–100.0)
Monocytes Absolute: 0.8 10*3/uL (ref 0.1–1.0)
Monocytes Relative: 10 %
Neutro Abs: 5.6 10*3/uL (ref 1.7–7.7)
Neutrophils Relative %: 65 %
Platelets: 375 10*3/uL (ref 150–400)
RBC: 3.37 MIL/uL — ABNORMAL LOW (ref 3.87–5.11)
RDW: 14.8 % (ref 11.5–15.5)
WBC: 8.6 10*3/uL (ref 4.0–10.5)
nRBC: 0 % (ref 0.0–0.2)

## 2021-01-31 LAB — CMP (CANCER CENTER ONLY)
ALT: 13 U/L (ref 0–44)
AST: 18 U/L (ref 15–41)
Albumin: 3.3 g/dL — ABNORMAL LOW (ref 3.5–5.0)
Alkaline Phosphatase: 71 U/L (ref 38–126)
Anion gap: 9 (ref 5–15)
BUN: 14 mg/dL (ref 6–20)
CO2: 22 mmol/L (ref 22–32)
Calcium: 9.2 mg/dL (ref 8.9–10.3)
Chloride: 108 mmol/L (ref 98–111)
Creatinine: 0.76 mg/dL (ref 0.44–1.00)
GFR, Estimated: >60 mL/min (ref >60)
Glucose, Bld: 121 mg/dL — ABNORMAL HIGH (ref 70–99)
Potassium: 4.1 mmol/L (ref 3.5–5.1)
Sodium: 139 mmol/L (ref 135–145)
Total Bilirubin: 0.4 mg/dL (ref 0.3–1.2)
Total Protein: 7.2 g/dL (ref 6.5–8.1)

## 2021-01-31 LAB — TSH: TSH: 0.32 u[IU]/mL (ref 0.308–3.960)

## 2021-01-31 MED ORDER — DOXORUBICIN HCL CHEMO IV INJECTION 2 MG/ML
40.0000 mg/m2 | Freq: Once | INTRAVENOUS | Status: AC
  Administered 2021-01-31: 96 mg via INTRAVENOUS
  Filled 2021-01-31: qty 48

## 2021-01-31 MED ORDER — PALONOSETRON HCL INJECTION 0.25 MG/5ML
INTRAVENOUS | Status: AC
  Filled 2021-01-31: qty 5

## 2021-01-31 MED ORDER — SODIUM CHLORIDE 0.9 % IV SOLN
400.0000 mg/m2 | Freq: Once | INTRAVENOUS | Status: AC
  Administered 2021-01-31: 960 mg via INTRAVENOUS
  Filled 2021-01-31: qty 48

## 2021-01-31 MED ORDER — SODIUM CHLORIDE 0.9% FLUSH
10.0000 mL | INTRAVENOUS | Status: DC | PRN
Start: 2021-01-31 — End: 2021-01-31
  Administered 2021-01-31: 10 mL
  Filled 2021-01-31: qty 10

## 2021-01-31 MED ORDER — OLANZAPINE 5 MG PO TABS
5.0000 mg | ORAL_TABLET | Freq: Once | ORAL | Status: AC
  Administered 2021-01-31: 5 mg via ORAL
  Filled 2021-01-31: qty 1

## 2021-01-31 MED ORDER — SODIUM CHLORIDE 0.9% FLUSH
10.0000 mL | Freq: Once | INTRAVENOUS | Status: AC
  Administered 2021-01-31: 10 mL
  Filled 2021-01-31: qty 10

## 2021-01-31 MED ORDER — SODIUM CHLORIDE 0.9 % IV SOLN
10.0000 mg | Freq: Once | INTRAVENOUS | Status: AC
  Administered 2021-01-31: 10 mg via INTRAVENOUS
  Filled 2021-01-31: qty 10

## 2021-01-31 MED ORDER — SODIUM CHLORIDE 0.9 % IV SOLN
Freq: Once | INTRAVENOUS | Status: AC
  Filled 2021-01-31: qty 250

## 2021-01-31 MED ORDER — SODIUM CHLORIDE 0.9 % IV SOLN
150.0000 mg | Freq: Once | INTRAVENOUS | Status: AC
  Administered 2021-01-31: 150 mg via INTRAVENOUS
  Filled 2021-01-31: qty 150

## 2021-01-31 MED ORDER — SODIUM CHLORIDE 0.9 % IV SOLN
200.0000 mg | Freq: Once | INTRAVENOUS | Status: AC
  Administered 2021-01-31: 200 mg via INTRAVENOUS
  Filled 2021-01-31: qty 8

## 2021-01-31 MED ORDER — HEPARIN SOD (PORK) LOCK FLUSH 100 UNIT/ML IV SOLN
500.0000 [IU] | Freq: Once | INTRAVENOUS | Status: AC | PRN
Start: 2021-01-31 — End: 2021-01-31
  Administered 2021-01-31: 500 [IU]
  Filled 2021-01-31: qty 5

## 2021-01-31 MED ORDER — PALONOSETRON HCL INJECTION 0.25 MG/5ML
0.2500 mg | Freq: Once | INTRAVENOUS | Status: AC
  Administered 2021-01-31: 0.25 mg via INTRAVENOUS

## 2021-01-31 NOTE — Assessment & Plan Note (Addendum)
10/31/20:Patient palpated a right breast mass x18mo Diagnostic mammogram and UKoreashowed an abnormality and calcifications in the right breast, 8.2cm, with skin thickening and right axillary adenopathy. 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  Treatment plan: 1. Neoadjuvant chemotherapy with Adriamycin and CytoxanKeytruda4 followed by Taxol weekly 12 with carboplatinand Keytrudaevery 3 weeks x473flowed by 1 year of torted Keytruda maintenance 2. Followed bymastectomy and axillary lymph node dissection 3. Followed by adjuvant radiation therapy UR CC nausea study CT CAP 11/14/2020: Enlarged right axillary lymph nodes, prominent left axillary lymph node. (Requires an ultrasound) no evidence of metastatic disease in the abdomen or pelvis Bone scan 11/16/2020: No evidence of bone metastases --------------------------------------------------------------------------------------------------------------------------------------------------------- Current treatment:Cycle 4Adriamycin Cytoxan and Keytruda Echocardiogram5/03/2021: EF70-75%  Toxicities: 1. Maculopapular rash: This happened after each treatment.  It responds to steroids.  I gave her 5 days of prednisone 20 mg. 2.Intractable nausea and vomiting to current anti nausea regimen: I decreased the dosage of chemotherapy today. 3.  Thrush: I sent another prescription for Diflucan. 4.  Severe fatigue  Return to clinic in 3 weeks for cycle 1 Taxol carboplatin KeBeryle Flock

## 2021-01-31 NOTE — Progress Notes (Signed)
Dr. Lindi Adie aware of patient's current prednisone prescription. Advises ok to proceed with pembrolizumab.

## 2021-01-31 NOTE — Patient Instructions (Signed)
Major ONCOLOGY   Discharge Instructions: Thank you for choosing [facility] to provide your oncology and hematology care.   If you have a lab appointment with the [facility], please go directly to the [facility] and check in at the registration area.   Wear comfortable clothing and clothing appropriate for easy access to any Portacath or PICC line.   We strive to give you quality time with your provider. You may need to reschedule your appointment if you arrive late (15 or more minutes).  Arriving late affects you and other patients whose appointments are after yours.  Also, if you miss three or more appointments without notifying the office, you may be dismissed from the clinic at the provider's discretion.      For prescription refill requests, have your pharmacy contact our office and allow 72 hours for refills to be completed.    Today you received the following chemotherapy and/or immunotherapy agents: pembrolizumab, doxorubicin, and cyclophosphamide.      To help prevent nausea and vomiting after your treatment, we encourage you to take your nausea medication as directed.  BELOW ARE SYMPTOMS THAT SHOULD BE REPORTED IMMEDIATELY: *FEVER GREATER THAN 100.4 F (38 C) OR HIGHER *CHILLS OR SWEATING *NAUSEA AND VOMITING THAT IS NOT CONTROLLED WITH YOUR NAUSEA MEDICATION *UNUSUAL SHORTNESS OF BREATH *UNUSUAL BRUISING OR BLEEDING *URINARY PROBLEMS (pain or burning when urinating, or frequent urination) *BOWEL PROBLEMS (unusual diarrhea, constipation, pain near the anus) TENDERNESS IN MOUTH AND THROAT WITH OR WITHOUT PRESENCE OF ULCERS (sore throat, sores in mouth, or a toothache) UNUSUAL RASH, SWELLING OR PAIN  UNUSUAL VAGINAL DISCHARGE OR ITCHING   Items with * indicate a potential emergency and should be followed up as soon as possible or go to the Emergency Department if any problems should occur.  Please show the CHEMOTHERAPY ALERT CARD  or IMMUNOTHERAPY ALERT CARD at check-in to the Emergency Department and triage nurse.  Should you have questions after your visit or need to cancel or reschedule your appointment, please contact [facility]  Dept: [phone]  and follow the prompts.  Office hours are 8:00 a.m. to 4:30 p.m. Monday - Friday. Please note that voicemails left after 4:00 p.m. may not be returned until the following business day.  We are closed weekends and major holidays. You have access to a nurse at all times for urgent questions. Please call the main number to the clinic Dept: [phone] and follow the prompts.   For any non-urgent questions, you may also contact your provider using MyChart. We now offer e-Visits for anyone 47 and older to request care online for non-urgent symptoms. For details visit [email or web address].   Also download the MyChart app! Go to the app store, search "MyChart", open the app, select [facility], and log in with your MyChart username and password.  Due to Covid, a mask is required upon entering the hospital/clinic. If you do not have a mask, one will be given to you upon arrival. For doctor visits, patients may have 1 support person aged 52 or older with them. For treatment visits, patients cannot have anyone with them due to current Covid guidelines and our immunocompromised population.

## 2021-02-01 LAB — T4: T4, Total: 7.6 ug/dL (ref 4.5–12.0)

## 2021-02-02 ENCOUNTER — Ambulatory Visit: Payer: Self-pay

## 2021-02-02 ENCOUNTER — Inpatient Hospital Stay: Payer: No Typology Code available for payment source

## 2021-02-02 NOTE — Progress Notes (Signed)
Patient had an appt for 02/02/2021 @ 1030. Patient did not show for appt. Multiple phone calls made to her phone listed in demographics. No answer, and no voicemail available. Spoke with patient's mother, Amber White, via phone. Amber White states she will contact Amber White. @ 1315, this RN called patient and spoke to her. She states she will be here in one hour. At 1440, patient had not arrived. Attempted to call patient, again. No answer, and no voicemail available. Spoke with patient's mother, again. She states she will contact patient and will call the infusion room once she has reached her. I educated patient's mother on the importance of this medication, and urged her to encourage patient to come in for injection. Amber White called the infusion room and states she is unable to reach patient. I informed Amber White that the [facility] is closing for the weekend; therefore, no one would be able to give her the injection today.

## 2021-02-04 ENCOUNTER — Inpatient Hospital Stay: Payer: No Typology Code available for payment source

## 2021-02-04 ENCOUNTER — Other Ambulatory Visit: Payer: Self-pay

## 2021-02-04 VITALS — BP 132/86 | HR 100 | Temp 98.5°F | Resp 20

## 2021-02-04 DIAGNOSIS — Z171 Estrogen receptor negative status [ER-]: Secondary | ICD-10-CM

## 2021-02-04 DIAGNOSIS — C50411 Malignant neoplasm of upper-outer quadrant of right female breast: Secondary | ICD-10-CM

## 2021-02-04 DIAGNOSIS — Z5112 Encounter for antineoplastic immunotherapy: Secondary | ICD-10-CM | POA: Diagnosis not present

## 2021-02-04 MED ORDER — PEGFILGRASTIM-BMEZ 6 MG/0.6ML ~~LOC~~ SOSY
6.0000 mg | PREFILLED_SYRINGE | Freq: Once | SUBCUTANEOUS | Status: AC
  Administered 2021-02-04: 6 mg via SUBCUTANEOUS

## 2021-02-04 MED ORDER — PEGFILGRASTIM-BMEZ 6 MG/0.6ML ~~LOC~~ SOSY
PREFILLED_SYRINGE | SUBCUTANEOUS | Status: AC
  Filled 2021-02-04: qty 0.6

## 2021-02-04 NOTE — Patient Instructions (Signed)

## 2021-02-07 ENCOUNTER — Encounter: Payer: Self-pay | Admitting: *Deleted

## 2021-02-07 ENCOUNTER — Encounter: Payer: Self-pay | Admitting: Hematology and Oncology

## 2021-02-13 ENCOUNTER — Other Ambulatory Visit: Payer: Self-pay | Admitting: Adult Health

## 2021-02-13 DIAGNOSIS — Z171 Estrogen receptor negative status [ER-]: Secondary | ICD-10-CM

## 2021-02-13 DIAGNOSIS — C50411 Malignant neoplasm of upper-outer quadrant of right female breast: Secondary | ICD-10-CM

## 2021-02-16 ENCOUNTER — Other Ambulatory Visit: Payer: Self-pay | Admitting: Adult Health

## 2021-02-16 DIAGNOSIS — C50411 Malignant neoplasm of upper-outer quadrant of right female breast: Secondary | ICD-10-CM

## 2021-02-16 DIAGNOSIS — Z171 Estrogen receptor negative status [ER-]: Secondary | ICD-10-CM

## 2021-02-18 ENCOUNTER — Encounter: Payer: Self-pay | Admitting: Hematology and Oncology

## 2021-02-20 NOTE — Progress Notes (Signed)
Patient Care Team: Hague, Rosalyn Charters, MD as PCP - General (Internal Medicine) Rockwell Germany, RN as Oncology Nurse Navigator Mauro Kaufmann, RN as Oncology Nurse Navigator Donnie Mesa, MD as Consulting Physician (General Surgery) Nicholas Lose, MD as Consulting Physician (Hematology and Oncology) Gery Pray, MD as Consulting Physician (Radiation Oncology)  DIAGNOSIS:    ICD-10-CM   1. Malignant neoplasm of upper-outer quadrant of right breast in female, estrogen receptor negative ([place])  C50.411    Z17.1       SUMMARY OF ONCOLOGIC HISTORY: Oncology History  Malignant neoplasm of upper-outer quadrant of right breast in female, estrogen receptor negative ([place])  10/31/2020 Initial Diagnosis   Patient palpated a right breast mass x12mo Diagnostic mammogram and UKoreashowed an abnormality and calcifications in the right breast, 8.2cm, with skin thickening and right axillary adenopathy. 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   11/07/2020 Cancer Staging   Staging form: Breast, AJCC 8th Edition - Clinical: Stage IIIC (cT4, cN1, cM0, G3, ER-, PR-, HER2: Equivocal) - Signed by GNicholas Lose MD on 11/07/2020 Histologic grading system: 3 grade system   11/22/2020 -  Chemotherapy   Patient is on Treatment Plan: BREAST PEMBROLIZUMAB + AC Q21D X 4 CYCLES FOLLOWED BY PEMBROLIZUMAB + CARBOPLATIN D1 + PACLITAXEL D1,8,15 Q21D X 4 CYCLES        Genetic Testing   Negative genetic testing. No pathogenic variants identified on the [facility] panel. The report date is 11/28/2020.  The CancerNext-Expanded + RNAinsight gene panel offered by [employer] includes sequencing and rearrangement analysis for the following 77 genes: IP, ALK, APC*, ATM*, AXIN2, BAP1, BARD1, BLM, BMPR1A, BRCA1*, BRCA2*, BRIP1*, CDC73, CDH1*,CDK4, CDKN1B, CDKN2A, CHEK2*, CTNNA1, DICER1, FANCC, FH, FLCN,  GALNT12, KIF1B, LZTR1, MAX, MEN1, MET, MLH1*, MSH2*, MSH3, MSH6*, MUTYH*, NBN, NF1*, NF2, NTHL1, PALB2*, PHOX2B, PMS2*, POT1, PRKAR1A, PTCH1, PTEN*, RAD51C*, RAD51D*,RB1, RECQL, RET, SDHA, SDHAF2, SDHB, SDHC, SDHD, SMAD4, SMARCA4, SMARCB1, SMARCE1, STK11, SUFU, TMEM127, TP53*,TSC1, TSC2, VHL and XRCC2 (sequencing and deletion/duplication); EGFR, EGLN1, HOXB13, KIT, MITF, PDGFRA, POLD1 and POLE (sequencing only); EPCAM and GREM1 (deletion/duplication only).     CHIEF COMPLIANT: Cycle 1 Taxol carboplatin and Keytruda  INTERVAL HISTORY: Amber Cassityis a 45y.o. with above-mentioned history of right breast cancer who completed 4 cycles of neoadjuvant chemotherapy with Adriamycin and Cytoxan Keytruda. She presents to the clinic today for a toxicity check and cycle 1 [place]  She did extremely well with the last cycle of chemotherapy.  She did not have nausea or vomiting.  Energy levels are also much better.  ALLERGIES:  is allergic to penicillins.  MEDICATIONS:  Current Outpatient Medications  Medication Sig Dispense Refill   dexamethasone (DECADRON) 4 MG tablet Take 1 tablet (4 mg total) by mouth 2 (two) times daily. Start the day after chemo and take twice a day for three days 10 tablet 0   fluconazole (DIFLUCAN) 100 MG tablet Take 1 tablet (100 mg total) by mouth daily. 14 tablet 1   lidocaine-prilocaine (EMLA) cream Apply to affected area once 30 g 3   OLANZapine (ZYPREXA) 10 MG tablet TAKE 0.5 TABLETS (5 MG TOTAL) BY MOUTH AT BEDTIME. START THE DAY AFTER CHEMO AND TAKE FOR THREE DAYS 45 tablet 1   omeprazole (PRILOSEC) 40 MG capsule TAKE 1 CAPSULE (40 MG TOTAL) BY MOUTH DAILY. 30 capsule 1   ondansetron (ZOFRAN) 8 MG tablet  Take 1 tablet (8 mg total) by mouth 2 (two) times daily as needed. Start on the third day after carboplatin and AC chemotherapy. 30 tablet 1   predniSONE (DELTASONE) 20 MG tablet Take 1 tablet (20 mg total) by mouth daily with breakfast. 20 tablet 0    prochlorperazine (COMPAZINE) 10 MG tablet Take 1 tablet (10 mg total) by mouth every 6 (six) hours as needed (Nausea or vomiting). 30 tablet 1   No current facility-administered medications for this visit.    PHYSICAL EXAMINATION: ECOG PERFORMANCE STATUS: 1 - Symptomatic but completely ambulatory  Vitals:   02/21/21 1204  BP: 136/77  Pulse: (!) 111  Resp: 18  Temp: (!) 97.2 F (36.2 C)  SpO2: 100%   Filed Weights   02/21/21 1204  Weight: 279 lb 3.2 oz (126.6 kg)    LABORATORY DATA:  I have reviewed the data as listed CMP Latest Ref Rng & Units 01/31/2021 01/10/2021 12/24/2020  Glucose 70 - 99 mg/dL 121(H) 131(H) 103(H)  BUN 6 - 20 mg/dL '14 13 13  ' Creatinine 0.44 - 1.00 mg/dL 0.76 0.74 0.67  Sodium 135 - 145 mmol/L 139 139 140  Potassium 3.5 - 5.1 mmol/L 4.1 3.5 4.0  Chloride 98 - 111 mmol/L 108 109 108  CO2 22 - 32 mmol/L 22 21(L) 24  Calcium 8.9 - 10.3 mg/dL 9.2 8.9 8.5(L)  Total Protein 6.5 - 8.1 g/dL 7.2 7.4 7.1  Total Bilirubin 0.3 - 1.2 mg/dL 0.4 <0.2(L) 0.4  Alkaline Phos 38 - 126 U/L 71 66 83  AST 15 - 41 U/L 18 10(L) 9(L)  ALT 0 - 44 U/L '13 7 8    ' Lab Results  Component Value Date   WBC 6.1 02/21/2021   HGB 10.4 (L) 02/21/2021   HCT 31.6 (L) 02/21/2021   MCV 92.4 02/21/2021   PLT 317 02/21/2021   NEUTROABS 3.1 02/21/2021    ASSESSMENT & PLAN:  Malignant neoplasm of upper-outer quadrant of right breast in female, estrogen receptor negative ([place]) 10/31/20: Patient palpated a right breast mass x66mo Diagnostic mammogram and UKoreashowed an abnormality and calcifications in the right breast, 8.2cm, with skin thickening and right axillary adenopathy. 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   Treatment plan: 1. Neoadjuvant chemotherapy with Adriamycin and Cytoxan Keytruda 4 followed by Taxol weekly 12 with carboplatin and Keytruda every 3 weeks x4 followed by 1 year  of torted Keytruda maintenance 2. Followed by mastectomy and axillary lymph node dissection  3. Followed by adjuvant radiation therapy UR CC nausea study CT CAP 11/14/2020: Enlarged right axillary lymph nodes, prominent left axillary lymph node.  (Requires an ultrasound) no evidence of metastatic disease in the abdomen or pelvis Bone scan 11/16/2020: No evidence of bone metastases --------------------------------------------------------------------------------------------------------------------------------------------------------- Current treatment: Completed 4 cycles of Adriamycin Cytoxan and Keytruda, today cycle 1 Taxol carboplatin and Keytruda Echocardiogram 11/19/2020: EF 70-75 %   Toxicities: 1. Maculopapular rash: Resolved with prednisone 20 mg. 2. Intractable nausea and vomiting.:  Remarkable improvement with the last cycle 3. Severe fatigue 4. Emotional distress:    We discussed the Taxol related adverse effects which include neuropathy as well as allergic reactions.  Return to clinic weekly for Taxol    No orders of the defined types were placed in this encounter.  The patient has a good understanding of the overall plan. she agrees with it. she will call with any problems that  may develop before the next visit here.  Total time spent: 30 mins including face to face time and time spent for planning, charting and coordination of care  Rulon Eisenmenger, MD, MPH 02/21/2021  I, Thana Ates, am acting as scribe for Dr. Nicholas Lose.  I have reviewed the above documentation for accuracy and completeness, and I agree with the above.

## 2021-02-21 ENCOUNTER — Encounter: Payer: Self-pay | Admitting: Licensed Clinical Social Worker

## 2021-02-21 ENCOUNTER — Other Ambulatory Visit: Payer: Self-pay

## 2021-02-21 ENCOUNTER — Inpatient Hospital Stay (HOSPITAL_BASED_OUTPATIENT_CLINIC_OR_DEPARTMENT_OTHER): Payer: No Typology Code available for payment source | Admitting: Hematology and Oncology

## 2021-02-21 ENCOUNTER — Inpatient Hospital Stay: Payer: No Typology Code available for payment source

## 2021-02-21 ENCOUNTER — Inpatient Hospital Stay: Payer: No Typology Code available for payment source | Attending: Hematology and Oncology

## 2021-02-21 VITALS — BP 119/88 | HR 93 | Resp 16

## 2021-02-21 DIAGNOSIS — Z5189 Encounter for other specified aftercare: Secondary | ICD-10-CM | POA: Diagnosis not present

## 2021-02-21 DIAGNOSIS — Z5112 Encounter for antineoplastic immunotherapy: Secondary | ICD-10-CM | POA: Insufficient documentation

## 2021-02-21 DIAGNOSIS — Z171 Estrogen receptor negative status [ER-]: Secondary | ICD-10-CM | POA: Insufficient documentation

## 2021-02-21 DIAGNOSIS — Z5111 Encounter for antineoplastic chemotherapy: Secondary | ICD-10-CM | POA: Insufficient documentation

## 2021-02-21 DIAGNOSIS — Z79899 Other long term (current) drug therapy: Secondary | ICD-10-CM | POA: Insufficient documentation

## 2021-02-21 DIAGNOSIS — C50411 Malignant neoplasm of upper-outer quadrant of right female breast: Secondary | ICD-10-CM | POA: Diagnosis not present

## 2021-02-21 DIAGNOSIS — C773 Secondary and unspecified malignant neoplasm of axilla and upper limb lymph nodes: Secondary | ICD-10-CM | POA: Insufficient documentation

## 2021-02-21 DIAGNOSIS — Z95828 Presence of other vascular implants and grafts: Secondary | ICD-10-CM

## 2021-02-21 LAB — CBC WITH DIFFERENTIAL/PLATELET
Abs Immature Granulocytes: 0.01 10*3/uL (ref 0.00–0.07)
Basophils Absolute: 0 10*3/uL (ref 0.0–0.1)
Basophils Relative: 1 %
Eosinophils Absolute: 0 10*3/uL (ref 0.0–0.5)
Eosinophils Relative: 0 %
HCT: 31.6 % — ABNORMAL LOW (ref 36.0–46.0)
Hemoglobin: 10.4 g/dL — ABNORMAL LOW (ref 12.0–15.0)
Immature Granulocytes: 0 %
Lymphocytes Relative: 35 %
Lymphs Abs: 2.1 10*3/uL (ref 0.7–4.0)
MCH: 30.4 pg (ref 26.0–34.0)
MCHC: 32.9 g/dL (ref 30.0–36.0)
MCV: 92.4 fL (ref 80.0–100.0)
Monocytes Absolute: 0.8 10*3/uL (ref 0.1–1.0)
Monocytes Relative: 13 %
Neutro Abs: 3.1 10*3/uL (ref 1.7–7.7)
Neutrophils Relative %: 51 %
Platelets: 317 10*3/uL (ref 150–400)
RBC: 3.42 MIL/uL — ABNORMAL LOW (ref 3.87–5.11)
RDW: 14.7 % (ref 11.5–15.5)
WBC: 6.1 10*3/uL (ref 4.0–10.5)
nRBC: 0 % (ref 0.0–0.2)

## 2021-02-21 LAB — COMPREHENSIVE METABOLIC PANEL
ALT: 12 U/L (ref 0–44)
AST: 12 U/L — ABNORMAL LOW (ref 15–41)
Albumin: 3.5 g/dL (ref 3.5–5.0)
Alkaline Phosphatase: 80 U/L (ref 38–126)
Anion gap: 9 (ref 5–15)
BUN: 17 mg/dL (ref 6–20)
CO2: 23 mmol/L (ref 22–32)
Calcium: 9.4 mg/dL (ref 8.9–10.3)
Chloride: 108 mmol/L (ref 98–111)
Creatinine, Ser: 0.88 mg/dL (ref 0.44–1.00)
GFR, Estimated: >60 mL/min (ref >60)
Glucose, Bld: 144 mg/dL — ABNORMAL HIGH (ref 70–99)
Potassium: 3.5 mmol/L (ref 3.5–5.1)
Sodium: 140 mmol/L (ref 135–145)
Total Bilirubin: 0.4 mg/dL (ref 0.3–1.2)
Total Protein: 7.9 g/dL (ref 6.5–8.1)

## 2021-02-21 LAB — TSH: TSH: 1.178 u[IU]/mL (ref 0.308–3.960)

## 2021-02-21 MED ORDER — SODIUM CHLORIDE 0.9 % IV SOLN
10.0000 mg | Freq: Once | INTRAVENOUS | Status: AC
  Administered 2021-02-21: 10 mg via INTRAVENOUS
  Filled 2021-02-21: qty 10

## 2021-02-21 MED ORDER — HEPARIN SOD (PORK) LOCK FLUSH 100 UNIT/ML IV SOLN
500.0000 [IU] | Freq: Once | INTRAVENOUS | Status: AC | PRN
  Administered 2021-02-21: 500 [IU]
  Filled 2021-02-21: qty 5

## 2021-02-21 MED ORDER — SODIUM CHLORIDE 0.9 % IV SOLN
700.0000 mg | Freq: Once | INTRAVENOUS | Status: AC
  Administered 2021-02-21: 700 mg via INTRAVENOUS
  Filled 2021-02-21: qty 70

## 2021-02-21 MED ORDER — METHYLPREDNISOLONE SODIUM SUCC 125 MG IJ SOLR
125.0000 mg | Freq: Once | INTRAMUSCULAR | Status: AC | PRN
  Administered 2021-02-21: 125 mg via INTRAVENOUS

## 2021-02-21 MED ORDER — DIPHENHYDRAMINE HCL 50 MG/ML IJ SOLN
25.0000 mg | Freq: Once | INTRAMUSCULAR | Status: AC
  Administered 2021-02-21: 25 mg via INTRAVENOUS
  Filled 2021-02-21: qty 1

## 2021-02-21 MED ORDER — FAMOTIDINE 20 MG IN NS 100 ML IVPB
20.0000 mg | Freq: Once | INTRAVENOUS | Status: AC | PRN
  Administered 2021-02-21: 20 mg via INTRAVENOUS

## 2021-02-21 MED ORDER — SODIUM CHLORIDE 0.9 % IV SOLN
200.0000 mg | Freq: Once | INTRAVENOUS | Status: AC
  Administered 2021-02-21: 200 mg via INTRAVENOUS
  Filled 2021-02-21: qty 8

## 2021-02-21 MED ORDER — DIPHENHYDRAMINE HCL 50 MG/ML IJ SOLN
50.0000 mg | Freq: Once | INTRAMUSCULAR | Status: AC | PRN
  Administered 2021-02-21: 25 mg via INTRAVENOUS

## 2021-02-21 MED ORDER — SODIUM CHLORIDE 0.9 % IV SOLN
80.0000 mg/m2 | Freq: Once | INTRAVENOUS | Status: AC
  Administered 2021-02-21: 192 mg via INTRAVENOUS
  Filled 2021-02-21: qty 32

## 2021-02-21 MED ORDER — FAMOTIDINE 20 MG IN NS 100 ML IVPB
20.0000 mg | Freq: Once | INTRAVENOUS | Status: AC
  Administered 2021-02-21: 20 mg via INTRAVENOUS
  Filled 2021-02-21: qty 100

## 2021-02-21 MED ORDER — SODIUM CHLORIDE 0.9% FLUSH
10.0000 mL | Freq: Once | INTRAVENOUS | Status: AC
  Administered 2021-02-21: 10 mL
  Filled 2021-02-21: qty 10

## 2021-02-21 MED ORDER — SODIUM CHLORIDE 0.9 % IV SOLN
Freq: Once | INTRAVENOUS | Status: AC
  Filled 2021-02-21: qty 250

## 2021-02-21 MED ORDER — SODIUM CHLORIDE 0.9% FLUSH
10.0000 mL | INTRAVENOUS | Status: DC | PRN
  Administered 2021-02-21: 10 mL
  Filled 2021-02-21: qty 10

## 2021-02-21 MED ORDER — FOSAPREPITANT DIMEGLUMINE INJECTION 150 MG
150.0000 mg | Freq: Once | INTRAVENOUS | Status: AC
  Administered 2021-02-21: 150 mg via INTRAVENOUS
  Filled 2021-02-21: qty 150

## 2021-02-21 MED ORDER — PALONOSETRON HCL INJECTION 0.25 MG/5ML
0.2500 mg | Freq: Once | INTRAVENOUS | Status: AC
  Administered 2021-02-21: 0.25 mg via INTRAVENOUS
  Filled 2021-02-21: qty 5

## 2021-02-21 NOTE — Progress Notes (Signed)
Per Dr. Lindi Adie, ok to treat with HR 111.

## 2021-02-21 NOTE — Patient Instructions (Signed)
[facility] MEDICAL ONCOLOGY ?  Discharge Instructions: ?Thank you for choosing [facility] to provide your oncology and hematology care.  ? ?If you have a lab appointment with the Cancer Center, please go directly to the Cancer Center and check in at the registration area. ?  ?Wear comfortable clothing and clothing appropriate for easy access to any Portacath or PICC line.  ? ?We strive to give you quality time with your provider. You may need to reschedule your appointment if you arrive late (15 or more minutes).  Arriving late affects you and other patients whose appointments are after yours.  Also, if you miss three or more appointments without notifying the office, you may be dismissed from the clinic at the provider?s discretion.    ?  ?For prescription refill requests, have your pharmacy contact our office and allow 72 hours for refills to be completed.   ? ?Today you received the following chemotherapy and/or immunotherapy agents: pembrolizumab, paclitaxel, and carboplatin    ?  ?To help prevent nausea and vomiting after your treatment, we encourage you to take your nausea medication as directed. ? ?BELOW ARE SYMPTOMS THAT SHOULD BE REPORTED IMMEDIATELY: ?*FEVER GREATER THAN 100.4 F (38 ?C) OR HIGHER ?*CHILLS OR SWEATING ?*NAUSEA AND VOMITING THAT IS NOT CONTROLLED WITH YOUR NAUSEA MEDICATION ?*UNUSUAL SHORTNESS OF BREATH ?*UNUSUAL BRUISING OR BLEEDING ?*URINARY PROBLEMS (pain or burning when urinating, or frequent urination) ?*BOWEL PROBLEMS (unusual diarrhea, constipation, pain near the anus) ?TENDERNESS IN MOUTH AND THROAT WITH OR WITHOUT PRESENCE OF ULCERS (sore throat, sores in mouth, or a toothache) ?UNUSUAL RASH, SWELLING OR PAIN  ?UNUSUAL VAGINAL DISCHARGE OR ITCHING  ? ?Items with * indicate a potential emergency and should be followed up as soon as possible or go to the Emergency Department if any problems should occur. ? ?Please show the CHEMOTHERAPY ALERT CARD or  IMMUNOTHERAPY ALERT CARD at check-in to the Emergency Department and triage nurse. ? ?Should you have questions after your visit or need to cancel or reschedule your appointment, please contact [facility] MEDICAL ONCOLOG[facility]  Dept: [phone]  and follow the prompts.  Office hours are 8:00 a.m. to 4:30 p.m. Monday - Friday. Please note that voicemails left after 4:00 p.m. may not be returned until the following business day.  We are closed weekends and major holidays. You have access to a nurse at all times for urgent questions. Please call the main number to the clinic Dept: [phone] and follow the prompts. ? ? ?For any non-urgent questions, you may also contact your provider using MyChart. We now offer e-Visits for anyone 18 and older to request care online for non-urgent symptoms. For details visit [email or web address]. ?  ?Also download the MyChart app! Go to the app store, search "MyChart", open the app, select [facility], and log in with your MyChart username and password. ? ?Due to Covid, a mask is required upon entering the hospital/clinic. If you do not have a mask, one will be given to you upon arrival. For doctor visits, patients may have 1 support person aged 18 or older with them. For treatment visits, patients cannot have anyone with them due to current Covid guidelines and our immunocompromised population.  ? ?

## 2021-02-21 NOTE — Progress Notes (Signed)
[facility] CSW Progress Note  Clinical Social Worker checked in briefly with patient in infusion. She is starting new chemo regimen today and will now be coming in weekly.  Pt forgot to bring cancer foundation applications but asked that CSW see her next week when she will bring them with her.  Follow-up: 8/18 in infusion   Christeen Douglas , LCSW

## 2021-02-21 NOTE — Progress Notes (Signed)
Hypersensitivity Reaction note  Date of event: 02/21/21 Time of event: 1510 Generic name of drug involved: paclitaxel Name of provider notified of the hypersensitivity reaction: Dr. Nicholas Lose Was agent that likely caused hypersensitivity reaction added to Allergies List within EMR? yes Chain of events including reaction signs/symptoms, treatment administered, and outcome (e.g., drug resumed; drug discontinued; sent to Emergency Department; etc.) Patient c/o chest burning and nausea shortly after initiation of paclitaxel. Hypersensitivity protocol initiated and medicated as documented in [facility]. Symptoms resolved to baseline. Paclitaxel infusion resumed and completed without further incident.  Amber Patience, RN 02/21/2021 4:01 PM

## 2021-02-21 NOTE — Assessment & Plan Note (Signed)
10/31/20:Patient palpated a right breast mass x60mo Diagnostic mammogram and UKoreashowed an abnormality and calcifications in the right breast, 8.2cm, with skin thickening and right axillary adenopathy. 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  Treatment plan: 1. Neoadjuvant chemotherapy with Adriamycin and CytoxanKeytruda4 followed by Taxol weekly 12 with carboplatinand Keytrudaevery 3 weeks x465flowed by 1 year of torted Keytruda maintenance 2. Followed bymastectomy and axillary lymph node dissection 3. Followed by adjuvant radiation therapy UR CC nausea study CT CAP 11/14/2020: Enlarged right axillary lymph nodes, prominent left axillary lymph node. (Requires an ultrasound) no evidence of metastatic disease in the abdomen or pelvis Bone scan 11/16/2020: No evidence of bone metastases --------------------------------------------------------------------------------------------------------------------------------------------------------- Current treatment:Completed 4 cycles ofAdriamycin Cytoxan and Keytruda, today cycle 1 Taxol carboplatin and Keytruda Echocardiogram5/03/2021: EF70-75%  Toxicities: 1. Maculopapular rash: This happened after each treatment. It responds to steroids. I gave her 5 days of prednisone 20 mg. 2.Intractable nausea and vomiting. 3.Severe fatigue 4. Emotional distress:   Return to clinic weekly for Taxol

## 2021-02-22 ENCOUNTER — Encounter: Payer: Self-pay | Admitting: Hematology and Oncology

## 2021-02-22 LAB — T4: T4, Total: 7.7 ug/dL (ref 4.5–12.0)

## 2021-02-25 ENCOUNTER — Telehealth: Payer: Self-pay | Admitting: Hematology and Oncology

## 2021-02-25 NOTE — Telephone Encounter (Signed)
Scheduled per 8/11 los. Pt will receive an updated appt calendar per next visit appt notes

## 2021-02-27 MED FILL — Dexamethasone Sodium Phosphate Inj 100 MG/10ML: INTRAMUSCULAR | Qty: 1 | Status: AC

## 2021-02-27 NOTE — Progress Notes (Signed)
Patient Care Team: Hague, Rosalyn Charters, MD as PCP - General (Internal Medicine) Rockwell Germany, RN as Oncology Nurse Navigator Mauro Kaufmann, RN as Oncology Nurse Navigator Donnie Mesa, MD as Consulting Physician (General Surgery) Nicholas Lose, MD as Consulting Physician (Hematology and Oncology) Gery Pray, MD as Consulting Physician (Radiation Oncology)  DIAGNOSIS:    ICD-10-CM   1. Malignant neoplasm of upper-outer quadrant of right breast in female, estrogen receptor negative ([place])  C50.411    Z17.1       SUMMARY OF ONCOLOGIC HISTORY: Oncology History  Malignant neoplasm of upper-outer quadrant of right breast in female, estrogen receptor negative ([place])  10/31/2020 Initial Diagnosis   Patient palpated a right breast mass x22mo Diagnostic mammogram and UKore[place]owed an abnormality and calcifications in the right breast, 8.2cm, with skin thickening and right axillary adenopathy. 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   11/07/2020 Cancer Staging   Staging form: Breast, AJCC 8th Edition - Clinical: Stage IIIC (cT4, cN1, cM0, G3, ER-, PR-, HER2: Equivocal) - Signed by GNicholas Lose MD on 11/07/2020 Histologic grading system: 3 grade system   11/22/2020 -  Chemotherapy   Patient is on Treatment Plan: BREAST PEMBROLIZUMAB + AC Q21D X 4 CYCLES FOLLOWED BY PEMBROLIZUMAB + CARBOPLATIN D1 + PACLITAXEL D1,8,15 Q21D X 4 CYCLES        Genetic Testing   Negative genetic testing. No pathogenic variants identified on the [facility]. The report date is 11/28/2020.  The CancerNext-Expanded + RNAinsight gene panel offered by [employer] includes sequencing and rearrangement analysis for the following 77 genes: IP, ALK, APC*, ATM*, AXIN2, BAP1, BARD1, BLM, BMPR1A, BRCA1*, BRCA2*, BRIP1*, CDC73, CDH1*,CDK4, CDKN1B, CDKN2A, CHEK2*, CTNNA1, DICER1, FANCC, FH, FLCN,  GALNT12, KIF1B, LZTR1, MAX, MEN1, MET, MLH1*, MSH2*, MSH3, MSH6*, MUTYH*, NBN, NF1*, NF2, NTHL1, PALB2*, PHOX2B, PMS2*, POT1, PRKAR1A, PTCH1, PTEN*, RAD51C*, RAD51D*,RB1, RECQL, RET, SDHA, SDHAF2, SDHB, SDHC, SDHD, SMAD4, SMARCA4, SMARCB1, SMARCE1, STK11, SUFU, TMEM127, TP53*,TSC1, TSC2, VHL and XRCC2 (sequencing and deletion/duplication); EGFR, EGLN1, HOXB13, KIT, MITF, PDGFRA, POLD1 and POLE (sequencing only); EPCAM and GREM1 (deletion/duplication only).     CHIEF COMPLIANT: Cycle 2 Taxol  INTERVAL HISTORY: Amber Rawlingis a 45y.o. with above-mentioned history of right breast cancer who completed 4 cycles of neoadjuvant chemotherapy with Adriamycin and Cytoxan Keytruda. She presents to the clinic today for a toxicity check and for cycle 2 of Taxol.  Last [place]  She has noticed profound hot flashes and vaginal dryness over the past week.  She tells me that it all came on suddenly.  She is feeling menopausal symptoms.  ALLERGIES:  is allergic to paclitaxel and penicillins.  MEDICATIONS:  Current Outpatient Medications  Medication Sig Dispense Refill   dexamethasone (DECADRON) 4 MG tablet Take 1 tablet (4 mg total) by mouth 2 (two) times daily. Start the day after chemo and take twice a day for three days 10 tablet 0   fluconazole (DIFLUCAN) 100 MG tablet Take 1 tablet (100 mg total) by mouth daily. 14 tablet 1   lidocaine-prilocaine (EMLA) cream Apply to affected area once 30 g 3   OLANZapine (ZYPREXA) 10 MG tablet TAKE 0.5 TABLETS (5 MG TOTAL) BY MOUTH AT BEDTIME. START THE DAY AFTER CHEMO AND TAKE FOR THREE DAYS 45 tablet 1   omeprazole (PRILOSEC) 40 MG capsule TAKE 1 CAPSULE (40 MG TOTAL) BY MOUTH  DAILY. 30 capsule 1   ondansetron (ZOFRAN) 8 MG tablet Take 1 tablet (8 mg total) by mouth 2 (two) times daily as needed. Start on the third day after carboplatin and AC chemotherapy. 30 tablet 1   predniSONE (DELTASONE) 20 MG tablet Take 1 tablet (20 mg total) by  mouth daily with breakfast. 20 tablet 0   prochlorperazine (COMPAZINE) 10 MG tablet Take 1 tablet (10 mg total) by mouth every 6 (six) hours as needed (Nausea or vomiting). 30 tablet 1   No current facility-administered medications for this visit.    PHYSICAL EXAMINATION: ECOG PERFORMANCE STATUS: 1 - Symptomatic but completely ambulatory  Vitals:   02/28/21 1354  BP: 122/84  Pulse: (!) 107  Resp: 18  Temp: (!) 97.2 F (36.2 C)  SpO2: 100%   Filed Weights   02/28/21 1354  Weight: 276 lb 8 oz (125.4 kg)    LABORATORY DATA:  I have reviewed the data as listed CMP Latest Ref Rng & Units 02/21/2021 01/31/2021 01/10/2021  Glucose 70 - 99 mg/dL 144(H) 121(H) 131(H)  BUN 6 - 20 mg/dL _0 Creatinine 0.44 - 1.00 mg/dL 0.88 0.76 0.74  Sodium 135 - 145 mmol/L 140 139 139  Potassium 3.5 - 5.1 mmol/L 3.5 4.1 3.5  Chloride 98 - 111 mmol/L 108 108 109  CO2 22 - 32 mmol/L 23 22 21(L)  Calcium 8.9 - 10.3 mg/dL 9.4 9.2 8.9  Total Protein 6.5 - 8.1 g/dL 7.9 7.2 7.4  Total Bilirubin 0.3 - 1.2 mg/dL 0.4 0.4 <0.2(L)  Alkaline Phos 38 - 126 U/L 80 71 66  AST 15 - 41 U/L 12(L) 18 10(L)  ALT 0 - 44 U/L _1 Lab Results  Component Value Date   WBC 4.6 02/28/2021   HGB 10.4 (L) 02/28/2021   HCT 31.4 (L) 02/28/2021   MCV 91.8 02/28/2021   PLT 344 02/28/2021   NEUTROABS 2.5 02/28/2021    ASSESSMENT & PLAN:  Malignant neoplasm of upper-outer quadrant of right breast in female, estrogen receptor negative ([place]) 10/31/20: Patient palpated a right breast mass x107mo Diagnostic mammogram and UKoreashowed an abnormality and calcifications in the right breast, 8.2cm, with skin thickening and right axillary adenopathy. 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   Treatment plan: 1. Neoadjuvant chemotherapy with Adriamycin and Cytoxan Keytruda 4 followed by Taxol weekly 12 with carboplatin and  Keytruda every 3 weeks x4 followed by 1 year of torted Keytruda maintenance 2. Followed by mastectomy and axillary lymph node dissection  3. Followed by adjuvant radiation therapy UR CC nausea study CT CAP 11/14/2020: Enlarged right axillary lymph nodes, prominent left axillary lymph node.  (Requires an ultrasound) no evidence of metastatic disease in the abdomen or pelvis Bone scan 11/16/2020: No evidence of bone metastases --------------------------------------------------------------------------------------------------------------------------------------------------------- Current treatment: Completed 4 cycles of Adriamycin Cytoxan and Keytruda, today cycle 2 Taxol carboplatin and Keytruda Echocardiogram 11/19/2020: EF 70-75 %   Toxicities: 1. Maculopapular rash: Resolved with prednisone 20 mg. 2. Intractable nausea and vomiting.:  Remarkable improvement with the last cycle 3. Severe fatigue 4. Emotional distress:  5.  Complaint of chest burning and nausea during infusion of Taxol.  Resolved with anaphylaxis treatment.  Was able to finish Taxol.   Seen.  Menopausal symptoms: I discussed with her to watch it and if it gets much worse we can consider giving Effexor. Return to  clinic weekly for Taxol and every other week for follow-up with me.    No orders of the defined types were placed in this encounter.  The patient has a good understanding of the overall plan. she agrees with it. she will call with any problems that may develop before the next visit here.  Total time spent: 30 mins including face to face time and time spent for planning, charting and coordination of care  Rulon Eisenmenger, MD, MPH 02/28/2021  I, Thana Ates, am acting as scribe for Dr. Nicholas Lose.  I have reviewed the above documentation for accuracy and completeness, and I agree with the above.

## 2021-02-28 ENCOUNTER — Inpatient Hospital Stay: Payer: No Typology Code available for payment source | Admitting: Licensed Clinical Social Worker

## 2021-02-28 ENCOUNTER — Other Ambulatory Visit: Payer: No Typology Code available for payment source

## 2021-02-28 ENCOUNTER — Other Ambulatory Visit: Payer: Self-pay

## 2021-02-28 ENCOUNTER — Inpatient Hospital Stay: Payer: No Typology Code available for payment source

## 2021-02-28 ENCOUNTER — Encounter: Payer: Self-pay | Admitting: *Deleted

## 2021-02-28 ENCOUNTER — Inpatient Hospital Stay (HOSPITAL_BASED_OUTPATIENT_CLINIC_OR_DEPARTMENT_OTHER): Payer: No Typology Code available for payment source | Admitting: Hematology and Oncology

## 2021-02-28 ENCOUNTER — Ambulatory Visit: Payer: No Typology Code available for payment source

## 2021-02-28 ENCOUNTER — Encounter: Payer: Self-pay | Admitting: Licensed Clinical Social Worker

## 2021-02-28 ENCOUNTER — Other Ambulatory Visit: Payer: Self-pay | Admitting: Hematology and Oncology

## 2021-02-28 VITALS — HR 98

## 2021-02-28 DIAGNOSIS — Z171 Estrogen receptor negative status [ER-]: Secondary | ICD-10-CM | POA: Diagnosis not present

## 2021-02-28 DIAGNOSIS — C50411 Malignant neoplasm of upper-outer quadrant of right female breast: Secondary | ICD-10-CM | POA: Diagnosis not present

## 2021-02-28 DIAGNOSIS — Z5112 Encounter for antineoplastic immunotherapy: Secondary | ICD-10-CM | POA: Diagnosis not present

## 2021-02-28 DIAGNOSIS — Z95828 Presence of other vascular implants and grafts: Secondary | ICD-10-CM

## 2021-02-28 LAB — CBC WITH DIFFERENTIAL/PLATELET
Abs Immature Granulocytes: 0.03 10*3/uL (ref 0.00–0.07)
Basophils Absolute: 0 10*3/uL (ref 0.0–0.1)
Basophils Relative: 0 %
Eosinophils Absolute: 0.2 10*3/uL (ref 0.0–0.5)
Eosinophils Relative: 4 %
HCT: 31.4 % — ABNORMAL LOW (ref 36.0–46.0)
Hemoglobin: 10.4 g/dL — ABNORMAL LOW (ref 12.0–15.0)
Immature Granulocytes: 1 %
Lymphocytes Relative: 34 %
Lymphs Abs: 1.6 10*3/uL (ref 0.7–4.0)
MCH: 30.4 pg (ref 26.0–34.0)
MCHC: 33.1 g/dL (ref 30.0–36.0)
MCV: 91.8 fL (ref 80.0–100.0)
Monocytes Absolute: 0.4 10*3/uL (ref 0.1–1.0)
Monocytes Relative: 8 %
Neutro Abs: 2.5 10*3/uL (ref 1.7–7.7)
Neutrophils Relative %: 53 %
Platelets: 344 10*3/uL (ref 150–400)
RBC: 3.42 MIL/uL — ABNORMAL LOW (ref 3.87–5.11)
RDW: 14.1 % (ref 11.5–15.5)
WBC: 4.6 10*3/uL (ref 4.0–10.5)
nRBC: 0 % (ref 0.0–0.2)

## 2021-02-28 LAB — COMPREHENSIVE METABOLIC PANEL
ALT: 13 U/L (ref 0–44)
AST: 14 U/L — ABNORMAL LOW (ref 15–41)
Albumin: 3.5 g/dL (ref 3.5–5.0)
Alkaline Phosphatase: 68 U/L (ref 38–126)
Anion gap: 8 (ref 5–15)
BUN: 12 mg/dL (ref 6–20)
CO2: 25 mmol/L (ref 22–32)
Calcium: 9.3 mg/dL (ref 8.9–10.3)
Chloride: 106 mmol/L (ref 98–111)
Creatinine, Ser: 0.85 mg/dL (ref 0.44–1.00)
GFR, Estimated: >60 mL/min (ref >60)
Glucose, Bld: 110 mg/dL — ABNORMAL HIGH (ref 70–99)
Potassium: 3.8 mmol/L (ref 3.5–5.1)
Sodium: 139 mmol/L (ref 135–145)
Total Bilirubin: 0.3 mg/dL (ref 0.3–1.2)
Total Protein: 7.7 g/dL (ref 6.5–8.1)

## 2021-02-28 LAB — TSH: TSH: 0.553 u[IU]/mL (ref 0.308–3.960)

## 2021-02-28 MED ORDER — SODIUM CHLORIDE 0.9% FLUSH
10.0000 mL | INTRAVENOUS | Status: DC | PRN
Start: 2021-02-28 — End: 2021-02-28
  Administered 2021-02-28: 10 mL

## 2021-02-28 MED ORDER — FAMOTIDINE 20 MG IN NS 100 ML IVPB
20.0000 mg | Freq: Once | INTRAVENOUS | Status: AC
  Administered 2021-02-28: 20 mg via INTRAVENOUS

## 2021-02-28 MED ORDER — DIPHENHYDRAMINE HCL 50 MG/ML IJ SOLN
50.0000 mg | Freq: Once | INTRAMUSCULAR | Status: AC
  Administered 2021-02-28: 50 mg via INTRAVENOUS

## 2021-02-28 MED ORDER — SODIUM CHLORIDE 0.9 % IV SOLN
80.0000 mg/m2 | Freq: Once | INTRAVENOUS | Status: AC
  Administered 2021-02-28: 192 mg via INTRAVENOUS
  Filled 2021-02-28: qty 32

## 2021-02-28 MED ORDER — SODIUM CHLORIDE 0.9 % IV SOLN: Freq: Once | INTRAVENOUS | Status: AC

## 2021-02-28 MED ORDER — DIPHENHYDRAMINE HCL 50 MG/ML IJ SOLN
INTRAMUSCULAR | Status: AC
  Filled 2021-02-28: qty 1

## 2021-02-28 MED ORDER — SODIUM CHLORIDE 0.9% FLUSH
10.0000 mL | Freq: Once | INTRAVENOUS | Status: AC
  Administered 2021-02-28: 10 mL

## 2021-02-28 MED ORDER — SODIUM CHLORIDE 0.9 % IV SOLN
10.0000 mg | Freq: Once | INTRAVENOUS | Status: AC
  Administered 2021-02-28: 10 mg via INTRAVENOUS
  Filled 2021-02-28: qty 10

## 2021-02-28 MED ORDER — HEPARIN SOD (PORK) LOCK FLUSH 100 UNIT/ML IV SOLN
500.0000 [IU] | Freq: Once | INTRAVENOUS | Status: AC | PRN
  Administered 2021-02-28: 500 [IU]

## 2021-02-28 MED ORDER — FAMOTIDINE 20 MG IN NS 100 ML IVPB
INTRAVENOUS | Status: AC
  Filled 2021-02-28: qty 100

## 2021-02-28 NOTE — Progress Notes (Signed)
[place] CSW Progress Note  Holiday representative met with patient to follow-up on applications for assistance. Patient brought in [facility] application. CSW reviewed what supporting documents are still needed. Pt will obtain and provide to CSW to submit to [employer].    Christeen Douglas LCSW

## 2021-02-28 NOTE — Progress Notes (Signed)
A user error has taken place: encounter opened in error, closed for administrative reasons.

## 2021-02-28 NOTE — Patient Instructions (Signed)
[facility]COL[facility]   Discharge Instructions: Thank you for choosing [facility] to provide your oncology and hematology care.   If you have a lab appointment with the Cancer Center, please go directly to the Cancer Center and check in at the registration area.   Wear comfortable clothing and clothing appropriate for easy access to any Portacath or PICC line.   We strive to give you quality time with your provider. You may need to reschedule your appointment if you arrive late (15 or more minutes).  Arriving late affects you and other patients whose appointments are after yours.  Also, if you miss three or more appointments without notifying the office, you may be dismissed from the clinic at the provider's discretion.      For prescription refill requests, have your pharmacy contact our office and allow 72 hours for refills to be completed.    Today you received the following chemotherapy and/or immunotherapy agents: paclitaxel.      To help prevent nausea and vomiting after your treatment, we encourage you to take your nausea medication as directed.  BELOW ARE SYMPTOMS THAT SHOULD BE REPORTED IMMEDIATELY: *FEVER GREATER THAN 100.4 F (38 C) OR HIGHER *CHILLS OR SWEATING *NAUSEA AND VOMITING THAT IS NOT CONTROLLED WITH YOUR NAUSEA MEDICATION *UNUSUAL SHORTNESS OF BREATH *UNUSUAL BRUISING OR BLEEDING *URINARY PROBLEMS (pain or burning when urinating, or frequent urination) *BOWEL PROBLEMS (unusual diarrhea, constipation, pain near the anus) TENDERNESS IN MOUTH AND THROAT WITH OR WITHOUT PRESENCE OF ULCERS (sore throat, sores in mouth, or a toothache) UNUSUAL RASH, SWELLING OR PAIN  UNUSUAL VAGINAL DISCHARGE OR ITCHING   Items with * indicate a potential emergency and should be followed up as soon as possible or go to the Emergency Department if any problems should occur.  Please show the CHEMOTHERAPY ALERT CARD or IMMUNOTHERAPY ALERT CARD at check-in  to the Emergency Department and triage nurse.  Should you have questions after your visit or need to cancel or reschedule your appointment, please contact [facility]  Dept: [phone]  and follow the prompts.  Office hours are 8:00 a.m. to 4:30 p.m. Monday - Friday. Please note that voicemails left after 4:00 p.m. may not be returned until the following business day.  We are closed weekends and major holidays. You have access to a nurse at all times for urgent questions. Please call the main number to the clinic Dept: [phone] and follow the prompts.   For any non-urgent questions, you may also contact your provider using MyChart. We now offer e-Visits for anyone 18 and older to request care online for non-urgent symptoms. For details visit [email or web address].   Also download the MyChart app! Go to the app store, search "MyChart", open the app, select [place], and log in with your MyChart username and password.  Due to Covid, a mask is required upon entering the hospital/clinic. If you do not have a mask, one will be given to you upon arrival. For doctor visits, patients may have 1 support person aged 18 or older with them. For treatment visits, patients cannot have anyone with them due to current Covid guidelines and our immunocompromised population.   

## 2021-02-28 NOTE — Assessment & Plan Note (Signed)
10/31/20:Patient palpated a right breast mass x58mo Diagnostic mammogram and UKoreashowed an abnormality and calcifications in the right breast, 8.2cm, with skin thickening and right axillary adenopathy. 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  Treatment plan: 1. Neoadjuvant chemotherapy with Adriamycin and CytoxanKeytruda4 followed by Taxol weekly 12 with carboplatinand Keytrudaevery 3 weeks x479flowed by 1 year of torted Keytruda maintenance 2. Followed bymastectomy and axillary lymph node dissection 3. Followed by adjuvant radiation therapy UR CC nausea study CT CAP 11/14/2020: Enlarged right axillary lymph nodes, prominent left axillary lymph node. (Requires an ultrasound) no evidence of metastatic disease in the abdomen or pelvis Bone scan 11/16/2020: No evidence of bone metastases --------------------------------------------------------------------------------------------------------------------------------------------------------- Current treatment:Completed 4 cycles ofAdriamycin Cytoxan and Keytruda, today cycle 2 Taxol carboplatin and Keytruda Echocardiogram5/03/2021: EF70-75%  Toxicities: 1. Maculopapular rash: Resolved with prednisone 20 mg. 2.Intractable nausea and vomiting.:  Remarkable improvement with the last cycle 3.Severe fatigue 4.Emotional distress:  5.  Complaint of chest burning and nausea during infusion of Taxol.  Resolved with anaphylaxis treatment.  Was able to finish Taxol.  Return to clinic weekly for Taxol and every other week for follow-up with me.

## 2021-03-01 ENCOUNTER — Encounter: Payer: Self-pay | Admitting: Licensed Clinical Social Worker

## 2021-03-01 LAB — T4: T4, Total: 8.5 ug/dL (ref 4.5–12.0)

## 2021-03-06 MED FILL — Dexamethasone Sodium Phosphate Inj 100 MG/10ML: INTRAMUSCULAR | Qty: 1 | Status: AC

## 2021-03-07 ENCOUNTER — Ambulatory Visit: Payer: No Typology Code available for payment source

## 2021-03-07 ENCOUNTER — Inpatient Hospital Stay: Payer: No Typology Code available for payment source

## 2021-03-07 ENCOUNTER — Other Ambulatory Visit: Payer: Self-pay

## 2021-03-07 VITALS — BP 143/85 | HR 116 | Temp 98.9°F | Resp 18 | Ht 64.0 in | Wt 278.0 lb

## 2021-03-07 DIAGNOSIS — C50411 Malignant neoplasm of upper-outer quadrant of right female breast: Secondary | ICD-10-CM

## 2021-03-07 DIAGNOSIS — Z5112 Encounter for antineoplastic immunotherapy: Secondary | ICD-10-CM | POA: Diagnosis not present

## 2021-03-07 DIAGNOSIS — Z171 Estrogen receptor negative status [ER-]: Secondary | ICD-10-CM

## 2021-03-07 DIAGNOSIS — Z95828 Presence of other vascular implants and grafts: Secondary | ICD-10-CM

## 2021-03-07 LAB — CBC WITH DIFFERENTIAL/PLATELET
Abs Immature Granulocytes: 0.03 10*3/uL (ref 0.00–0.07)
Basophils Absolute: 0 10*3/uL (ref 0.0–0.1)
Basophils Relative: 0 %
Eosinophils Absolute: 0 10*3/uL (ref 0.0–0.5)
Eosinophils Relative: 0 %
HCT: 30.7 % — ABNORMAL LOW (ref 36.0–46.0)
Hemoglobin: 10 g/dL — ABNORMAL LOW (ref 12.0–15.0)
Immature Granulocytes: 1 %
Lymphocytes Relative: 12 %
Lymphs Abs: 0.6 10*3/uL — ABNORMAL LOW (ref 0.7–4.0)
MCH: 30.2 pg (ref 26.0–34.0)
MCHC: 32.6 g/dL (ref 30.0–36.0)
MCV: 92.7 fL (ref 80.0–100.0)
Monocytes Absolute: 0.2 10*3/uL (ref 0.1–1.0)
Monocytes Relative: 3 %
Neutro Abs: 4.2 10*3/uL (ref 1.7–7.7)
Neutrophils Relative %: 84 %
Platelets: 272 10*3/uL (ref 150–400)
RBC: 3.31 MIL/uL — ABNORMAL LOW (ref 3.87–5.11)
RDW: 14.4 % (ref 11.5–15.5)
WBC: 5 10*3/uL (ref 4.0–10.5)
nRBC: 0 % (ref 0.0–0.2)

## 2021-03-07 LAB — COMPREHENSIVE METABOLIC PANEL
ALT: 13 U/L (ref 0–44)
AST: 10 U/L — ABNORMAL LOW (ref 15–41)
Albumin: 3.5 g/dL (ref 3.5–5.0)
Alkaline Phosphatase: 62 U/L (ref 38–126)
Anion gap: 7 (ref 5–15)
BUN: 12 mg/dL (ref 6–20)
CO2: 24 mmol/L (ref 22–32)
Calcium: 9.5 mg/dL (ref 8.9–10.3)
Chloride: 107 mmol/L (ref 98–111)
Creatinine, Ser: 0.78 mg/dL (ref 0.44–1.00)
GFR, Estimated: >60 mL/min (ref >60)
Glucose, Bld: 145 mg/dL — ABNORMAL HIGH (ref 70–99)
Potassium: 4.1 mmol/L (ref 3.5–5.1)
Sodium: 138 mmol/L (ref 135–145)
Total Bilirubin: 0.3 mg/dL (ref 0.3–1.2)
Total Protein: 7.7 g/dL (ref 6.5–8.1)

## 2021-03-07 LAB — TSH: TSH: 0.124 u[IU]/mL — ABNORMAL LOW (ref 0.308–3.960)

## 2021-03-07 MED ORDER — SODIUM CHLORIDE 0.9% FLUSH
10.0000 mL | INTRAVENOUS | Status: DC | PRN
  Administered 2021-03-07: 10 mL

## 2021-03-07 MED ORDER — HEPARIN SOD (PORK) LOCK FLUSH 100 UNIT/ML IV SOLN
500.0000 [IU] | Freq: Once | INTRAVENOUS | Status: AC | PRN
  Administered 2021-03-07: 500 [IU]

## 2021-03-07 MED ORDER — DIPHENHYDRAMINE HCL 50 MG/ML IJ SOLN
25.0000 mg | Freq: Once | INTRAMUSCULAR | Status: AC
  Administered 2021-03-07: 25 mg via INTRAVENOUS
  Filled 2021-03-07: qty 1

## 2021-03-07 MED ORDER — SODIUM CHLORIDE 0.9 % IV SOLN: Freq: Once | INTRAVENOUS | Status: AC

## 2021-03-07 MED ORDER — SODIUM CHLORIDE 0.9 % IV SOLN
10.0000 mg | Freq: Once | INTRAVENOUS | Status: AC
  Administered 2021-03-07: 10 mg via INTRAVENOUS
  Filled 2021-03-07: qty 10

## 2021-03-07 MED ORDER — FAMOTIDINE 20 MG IN NS 100 ML IVPB
20.0000 mg | Freq: Once | INTRAVENOUS | Status: AC
  Administered 2021-03-07: 20 mg via INTRAVENOUS
  Filled 2021-03-07: qty 100

## 2021-03-07 MED ORDER — SODIUM CHLORIDE 0.9 % IV SOLN
80.0000 mg/m2 | Freq: Once | INTRAVENOUS | Status: AC
  Administered 2021-03-07: 192 mg via INTRAVENOUS
  Filled 2021-03-07: qty 32

## 2021-03-07 MED ORDER — SODIUM CHLORIDE 0.9% FLUSH
10.0000 mL | Freq: Once | INTRAVENOUS | Status: AC
  Administered 2021-03-07: 10 mL

## 2021-03-07 NOTE — Progress Notes (Signed)
Per Dr. Lindi Adie - okay to treat with elevated heartrate.

## 2021-03-07 NOTE — Patient Instructions (Signed)
[facility] ONCOLOGY   Discharge Instructions: Thank you for choosing [facility] to provide your oncology and hematology care.   If you have a lab appointment with the [facility], please go directly to the [facility] and check in at the registration area.   Wear comfortable clothing and clothing appropriate for easy access to any Portacath or PICC line.   We strive to give you quality time with your provider. You may need to reschedule your appointment if you arrive late (15 or more minutes).  Arriving late affects you and other patients whose appointments are after yours.  Also, if you miss three or more appointments without notifying the office, you may be dismissed from the clinic at the provider's discretion.      For prescription refill requests, have your pharmacy contact our office and allow 72 hours for refills to be completed.    Today you received the following chemotherapy and/or immunotherapy agents: Paclitaxel (Taxol)      To help prevent nausea and vomiting after your treatment, we encourage you to take your nausea medication as directed.  BELOW ARE SYMPTOMS THAT SHOULD BE REPORTED IMMEDIATELY: *FEVER GREATER THAN 100.4 F (38 C) OR HIGHER *CHILLS OR SWEATING *NAUSEA AND VOMITING THAT IS NOT CONTROLLED WITH YOUR NAUSEA MEDICATION *UNUSUAL SHORTNESS OF BREATH *UNUSUAL BRUISING OR BLEEDING *URINARY PROBLEMS (pain or burning when urinating, or frequent urination) *BOWEL PROBLEMS (unusual diarrhea, constipation, pain near the anus) TENDERNESS IN MOUTH AND THROAT WITH OR WITHOUT PRESENCE OF ULCERS (sore throat, sores in mouth, or a toothache) UNUSUAL RASH, SWELLING OR PAIN  UNUSUAL VAGINAL DISCHARGE OR ITCHING   Items with * indicate a potential emergency and should be followed up as soon as possible or go to the Emergency Department if any problems should occur.  Please show the CHEMOTHERAPY ALERT CARD or IMMUNOTHERAPY ALERT CARD at  check-in to the Emergency Department and triage nurse.  Should you have questions after your visit or need to cancel or reschedule your appointment, please contact [facility]  Dept: [phone]  and follow the prompts.  Office hours are 8:00 a.m. to 4:30 p.m. Monday - Friday. Please note that voicemails left after 4:00 p.m. may not be returned until the following business day.  We are closed weekends and major holidays. You have access to a nurse at all times for urgent questions. Please call the main number to the clinic Dept: [phone] and follow the prompts.   For any non-urgent questions, you may also contact your provider using MyChart. We now offer e-Visits for anyone 27 and older to request care online for non-urgent symptoms. For details visit [email or web address].   Also download the MyChart app! Go to the app store, search "MyChart", open the app, select [facility], and log in with your MyChart username and password.  Due to Covid, a mask is required upon entering the hospital/clinic. If you do not have a mask, one will be given to you upon arrival. For doctor visits, patients may have 1 support person aged 46 or older with them. For treatment visits, patients cannot have anyone with them due to current Covid guidelines and our immunocompromised population.

## 2021-03-08 ENCOUNTER — Ambulatory Visit: Payer: No Typology Code available for payment source

## 2021-03-08 ENCOUNTER — Inpatient Hospital Stay: Payer: No Typology Code available for payment source

## 2021-03-08 VITALS — BP 141/105 | HR 109 | Temp 98.2°F | Resp 24

## 2021-03-08 DIAGNOSIS — Z171 Estrogen receptor negative status [ER-]: Secondary | ICD-10-CM

## 2021-03-08 DIAGNOSIS — Z5112 Encounter for antineoplastic immunotherapy: Secondary | ICD-10-CM | POA: Diagnosis not present

## 2021-03-08 DIAGNOSIS — C50411 Malignant neoplasm of upper-outer quadrant of right female breast: Secondary | ICD-10-CM

## 2021-03-08 LAB — T4: T4, Total: 8.9 ug/dL (ref 4.5–12.0)

## 2021-03-08 MED ORDER — FILGRASTIM-AAFI 480 MCG/0.8ML IJ SOSY: 480.0000 ug | PREFILLED_SYRINGE | Freq: Once | INTRAMUSCULAR | Status: AC

## 2021-03-08 MED ORDER — FILGRASTIM-AAFI 480 MCG/0.8ML IJ SOSY
PREFILLED_SYRINGE | INTRAMUSCULAR | Status: AC
  Administered 2021-03-08: 480 ug via SUBCUTANEOUS
  Filled 2021-03-08: qty 0.8

## 2021-03-08 NOTE — Patient Instructions (Signed)
Filgrastim, G-CSF injection What is this medication? FILGRASTIM, G-CSF (fil GRA stim) is a granulocyte colony-stimulating factor that stimulates the growth of neutrophils, a type of white blood cell (WBC) important in the body's fight against infection. It is used to reduce the incidence of fever and infection in patients with certain types of cancer who are receiving chemotherapy that affects the bone marrow, to stimulate blood cell production for removal of WBCs from the body prior to a bone marrow transplantation, to reduce the incidence of fever and infection in patients who have severe chronic neutropenia, and to improve survival outcomes following high-dose radiation exposure that is toxic to the bone marrow. This medicine may be used for other purposes; ask your health care provider or pharmacist if you have questions. COMMON BRAND NAME(S): Neupogen, Nivestym, Releuko, Zarxio What should I tell my care team before I take this medication? They need to know if you have any of these conditions: kidney disease latex allergy ongoing radiation therapy sickle cell disease an unusual or allergic reaction to filgrastim, pegfilgrastim, other medicines, foods, dyes, or preservatives pregnant or trying to get pregnant breast-feeding How should I use this medication? This medicine is for injection under the skin or infusion into a vein. As an infusion into a vein, it is usually given by a health care professional in a hospital or clinic setting. If you get this medicine at home, you will be taught how to prepare and give this medicine. Refer to the Instructions for Use that come with your medication packaging. Use exactly as directed. Take your medicine at regular intervals. Do not take your medicine more often than directed. It is important that you put your used needles and syringes in a special sharps container. Do not put them in a trash can. If you do not have a sharps container, call your pharmacist  or healthcare provider to get one. Talk to your pediatrician regarding the use of this medicine in children. While this drug may be prescribed for children as young as 7 months for selected conditions, precautions do apply. Overdosage: If you think you have taken too much of this medicine contact a poison control center or emergency room at once. NOTE: This medicine is only for you. Do not share this medicine with others. What if I miss a dose? It is important not to miss your dose. Call your doctor or health care professional if you miss a dose. What may interact with this medication? This medicine may interact with the following medications: medicines that may cause a release of neutrophils, such as lithium This list may not describe all possible interactions. Give your health care provider a list of all the medicines, herbs, non-prescription drugs, or dietary supplements you use. Also tell them if you smoke, drink alcohol, or use illegal drugs. Some items may interact with your medicine. What should I watch for while using this medication? Your condition will be monitored carefully while you are receiving this medicine. You may need blood work done while you are taking this medicine. Talk to your health care provider about your risk of cancer. You may be more at risk for certain types of cancer if you take this medicine. What side effects may I notice from receiving this medication? Side effects that you should report to your doctor or health care professional as soon as possible: allergic reactions like skin rash, itching or hives, swelling of the face, lips, or tongue back pain dizziness or feeling faint fever pain, redness, or   irritation at site where injected pinpoint red spots on the skin shortness of breath or breathing problems signs and symptoms of kidney injury like trouble passing urine, change in the amount of urine, or red or dark-brown urine stomach or side pain, or pain at  the shoulder swelling tiredness unusual bleeding or bruising Side effects that usually do not require medical attention (report to your doctor or health care professional if they continue or are bothersome): bone pain cough diarrhea hair loss headache muscle pain This list may not describe all possible side effects. Call your doctor for medical advice about side effects. You may report side effects to FDA at 1-800-FDA-1088. Where should I keep my medication? Keep out of the reach of children. Store in a refrigerator between 2 and 8 degrees C (36 and 46 degrees F). Do not freeze. Keep in carton to protect from light. Throw away this medicine if vials or syringes are left out of the refrigerator for more than 24 hours. Throw away any unused medicine after the expiration date. NOTE: This sheet is a summary. It may not cover all possible information. If you have questions about this medicine, talk to your doctor, pharmacist, or health care provider.  2022 Elsevier/Gold Standard (2019-07-21 18:47:55)  

## 2021-03-08 NOTE — Progress Notes (Signed)
Discussed claritin/tylenol use & pt knows to come tomorrow for injection.

## 2021-03-09 ENCOUNTER — Ambulatory Visit: Payer: No Typology Code available for payment source

## 2021-03-09 ENCOUNTER — Other Ambulatory Visit: Payer: Self-pay

## 2021-03-09 ENCOUNTER — Inpatient Hospital Stay: Payer: No Typology Code available for payment source

## 2021-03-09 VITALS — BP 113/87 | HR 115 | Temp 98.0°F | Resp 18

## 2021-03-09 DIAGNOSIS — C50411 Malignant neoplasm of upper-outer quadrant of right female breast: Secondary | ICD-10-CM

## 2021-03-09 DIAGNOSIS — Z5112 Encounter for antineoplastic immunotherapy: Secondary | ICD-10-CM | POA: Diagnosis not present

## 2021-03-09 MED ORDER — FILGRASTIM-AAFI 480 MCG/0.8ML IJ SOSY
480.0000 ug | PREFILLED_SYRINGE | Freq: Once | INTRAMUSCULAR | Status: AC
  Administered 2021-03-09: 480 ug via SUBCUTANEOUS

## 2021-03-09 MED ORDER — FILGRASTIM-AAFI 480 MCG/0.8ML IJ SOSY
PREFILLED_SYRINGE | INTRAMUSCULAR | Status: AC
  Filled 2021-03-09: qty 0.8

## 2021-03-09 NOTE — Patient Instructions (Signed)
Filgrastim, G-CSF injection What is this medication? FILGRASTIM, G-CSF (fil GRA stim) is a granulocyte colony-stimulating factor that stimulates the growth of neutrophils, a type of white blood cell (WBC) important in the body's fight against infection. It is used to reduce the incidence of fever and infection in patients with certain types of cancer who are receiving chemotherapy that affects the bone marrow, to stimulate blood cell production for removal of WBCs from the body prior to a bone marrow transplantation, to reduce the incidence of fever and infection in patients who have severe chronic neutropenia, and to improve survival outcomes following high-dose radiation exposure that is toxic to the bone marrow. This medicine may be used for other purposes; ask your health care provider or pharmacist if you have questions. COMMON BRAND NAME(S): Neupogen, Nivestym, Releuko, Zarxio What should I tell my care team before I take this medication? They need to know if you have any of these conditions: kidney disease latex allergy ongoing radiation therapy sickle cell disease an unusual or allergic reaction to filgrastim, pegfilgrastim, other medicines, foods, dyes, or preservatives pregnant or trying to get pregnant breast-feeding How should I use this medication? This medicine is for injection under the skin or infusion into a vein. As an infusion into a vein, it is usually given by a health care professional in a hospital or clinic setting. If you get this medicine at home, you will be taught how to prepare and give this medicine. Refer to the Instructions for Use that come with your medication packaging. Use exactly as directed. Take your medicine at regular intervals. Do not take your medicine more often than directed. It is important that you put your used needles and syringes in a special sharps container. Do not put them in a trash can. If you do not have a sharps container, call your pharmacist  or healthcare provider to get one. Talk to your pediatrician regarding the use of this medicine in children. While this drug may be prescribed for children as young as 7 months for selected conditions, precautions do apply. Overdosage: If you think you have taken too much of this medicine contact a poison control center or emergency room at once. NOTE: This medicine is only for you. Do not share this medicine with others. What if I miss a dose? It is important not to miss your dose. Call your doctor or health care professional if you miss a dose. What may interact with this medication? This medicine may interact with the following medications: medicines that may cause a release of neutrophils, such as lithium This list may not describe all possible interactions. Give your health care provider a list of all the medicines, herbs, non-prescription drugs, or dietary supplements you use. Also tell them if you smoke, drink alcohol, or use illegal drugs. Some items may interact with your medicine. What should I watch for while using this medication? Your condition will be monitored carefully while you are receiving this medicine. You may need blood work done while you are taking this medicine. Talk to your health care provider about your risk of cancer. You may be more at risk for certain types of cancer if you take this medicine. What side effects may I notice from receiving this medication? Side effects that you should report to your doctor or health care professional as soon as possible: allergic reactions like skin rash, itching or hives, swelling of the face, lips, or tongue back pain dizziness or feeling faint fever pain, redness, or   irritation at site where injected pinpoint red spots on the skin shortness of breath or breathing problems signs and symptoms of kidney injury like trouble passing urine, change in the amount of urine, or red or dark-brown urine stomach or side pain, or pain at  the shoulder swelling tiredness unusual bleeding or bruising Side effects that usually do not require medical attention (report to your doctor or health care professional if they continue or are bothersome): bone pain cough diarrhea hair loss headache muscle pain This list may not describe all possible side effects. Call your doctor for medical advice about side effects. You may report side effects to FDA at 1-800-FDA-1088. Where should I keep my medication? Keep out of the reach of children. Store in a refrigerator between 2 and 8 degrees C (36 and 46 degrees F). Do not freeze. Keep in carton to protect from light. Throw away this medicine if vials or syringes are left out of the refrigerator for more than 24 hours. Throw away any unused medicine after the expiration date. NOTE: This sheet is a summary. It may not cover all possible information. If you have questions about this medicine, talk to your doctor, pharmacist, or health care provider.  2022 Elsevier/Gold Standard (2019-07-21 18:47:55)  

## 2021-03-10 ENCOUNTER — Other Ambulatory Visit: Payer: Self-pay | Admitting: Hematology and Oncology

## 2021-03-10 DIAGNOSIS — C50411 Malignant neoplasm of upper-outer quadrant of right female breast: Secondary | ICD-10-CM

## 2021-03-10 DIAGNOSIS — Z171 Estrogen receptor negative status [ER-]: Secondary | ICD-10-CM

## 2021-03-11 ENCOUNTER — Ambulatory Visit: Payer: No Typology Code available for payment source

## 2021-03-11 ENCOUNTER — Inpatient Hospital Stay: Payer: No Typology Code available for payment source

## 2021-03-11 ENCOUNTER — Other Ambulatory Visit: Payer: Self-pay | Admitting: Hematology and Oncology

## 2021-03-11 ENCOUNTER — Encounter: Payer: Self-pay | Admitting: Hematology and Oncology

## 2021-03-11 DIAGNOSIS — C50411 Malignant neoplasm of upper-outer quadrant of right female breast: Secondary | ICD-10-CM

## 2021-03-11 DIAGNOSIS — Z171 Estrogen receptor negative status [ER-]: Secondary | ICD-10-CM

## 2021-03-12 ENCOUNTER — Encounter: Payer: Self-pay | Admitting: Hematology and Oncology

## 2021-03-12 ENCOUNTER — Other Ambulatory Visit: Payer: Self-pay | Admitting: Hematology and Oncology

## 2021-03-12 DIAGNOSIS — Z171 Estrogen receptor negative status [ER-]: Secondary | ICD-10-CM

## 2021-03-12 DIAGNOSIS — C50411 Malignant neoplasm of upper-outer quadrant of right female breast: Secondary | ICD-10-CM

## 2021-03-13 ENCOUNTER — Other Ambulatory Visit: Payer: Self-pay | Admitting: Hematology and Oncology

## 2021-03-13 DIAGNOSIS — Z171 Estrogen receptor negative status [ER-]: Secondary | ICD-10-CM

## 2021-03-13 DIAGNOSIS — C50411 Malignant neoplasm of upper-outer quadrant of right female breast: Secondary | ICD-10-CM

## 2021-03-13 MED FILL — Fosaprepitant Dimeglumine For IV Infusion 150 MG (Base Eq): INTRAVENOUS | Qty: 5 | Status: AC

## 2021-03-13 MED FILL — Dexamethasone Sodium Phosphate Inj 100 MG/10ML: INTRAMUSCULAR | Qty: 1 | Status: AC

## 2021-03-13 NOTE — Progress Notes (Signed)
Patient Care Team: Hague, Rosalyn Charters, MD as PCP - General (Internal Medicine) Rockwell Germany, RN as Oncology Nurse Navigator Mauro Kaufmann, RN as Oncology Nurse Navigator Donnie Mesa, MD as Consulting Physician (General Surgery) Nicholas Lose, MD as Consulting Physician (Hematology and Oncology) Gery Pray, MD as Consulting Physician (Radiation Oncology)  DIAGNOSIS:    ICD-10-CM   1. Malignant neoplasm of upper-outer quadrant of right breast in female, estrogen receptor negative ([place])  C50.411    Z17.1       SUMMARY OF ONCOLOGIC HISTORY: Oncology History  Malignant neoplasm of upper-outer quadrant of right breast in female, estrogen receptor negative ([place])  10/31/2020 Initial Diagnosis   Patient palpated a right breast mass x75mo Diagnostic mammogram and UKoreashowed an abnormality and calcifications in the right breast, 8.2cm, with skin thickening and right axillary adenopathy. 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   11/07/2020 Cancer Staging   Staging form: Breast, AJCC 8th Edition - Clinical: Stage IIIC (cT4, cN1, cM0, G3, ER-, PR-, HER2: Equivocal) - Signed by GNicholas Lose MD on 11/07/2020 Histologic grading system: 3 grade system   11/22/2020 -  Chemotherapy   Patient is on Treatment Plan: BREAST PEMBROLIZUMAB + AC Q21D X 4 CYCLES FOLLOWED BY PEMBROLIZUMAB + CARBOPLATIN D1 + PACLITAXEL D1,8,15 Q21D X 4 CYCLES        Genetic Testing   Negative genetic testing. No pathogenic variants identified on the [facility] panel. The report date is 11/28/2020.  The CancerNext-Expanded + RNAinsight gene panel offered by [employer] includes sequencing and rearrangement analysis for the following 77 genes: IP, ALK, APC*, ATM*, AXIN2, BAP1, BARD1, BLM, BMPR1A, BRCA1*, BRCA2*, BRIP1*, CDC73, CDH1*,CDK4, CDKN1B, CDKN2A, CHEK2*, CTNNA1, DICER1, FANCC, FH, FLCN,  GALNT12, KIF1B, LZTR1, MAX, MEN1, MET, MLH1*, MSH2*, MSH3, MSH6*, MUTYH*, NBN, NF1*, NF2, NTHL1, PALB2*, PHOX2B, PMS2*, POT1, PRKAR1A, PTCH1, PTEN*, RAD51C*, RAD51D*,RB1, RECQL, RET, SDHA, SDHAF2, SDHB, SDHC, SDHD, SMAD4, SMARCA4, SMARCB1, SMARCE1, STK11, SUFU, TMEM127, TP53*,TSC1, TSC2, VHL and XRCC2 (sequencing and deletion/duplication); EGFR, EGLN1, HOXB13, KIT, MITF, PDGFRA, POLD1 and POLE (sequencing only); EPCAM and GREM1 (deletion/duplication only).     CHIEF COMPLIANT: Cycle 4 Taxol  INTERVAL HISTORY: [name] a 45y.o. with above-mentioned history of right breast cancer who completed 4 cycles of neoadjuvant chemotherapy with Adriamycin and Cytoxan Keytruda. She presents to the clinic today for a toxicity check and for Day 18, Cycle 4 of Taxol.   ALLERGIES:  is allergic to paclitaxel and penicillins.  MEDICATIONS:  Current Outpatient Medications  Medication Sig Dispense Refill   dexamethasone (DECADRON) 4 MG tablet Take 1 tablet (4 mg total) by mouth 2 (two) times daily. Start the day after chemo and take twice a day for three days 10 tablet 0   fluconazole (DIFLUCAN) 100 MG tablet Take 1 tablet (100 mg total) by mouth daily. 14 tablet 1   lidocaine-prilocaine (EMLA) cream Apply to affected area once 30 g 3   OLANZapine (ZYPREXA) 10 MG tablet TAKE 0.5 TABLETS (5 MG TOTAL) BY MOUTH AT BEDTIME. START THE DAY AFTER CHEMO AND TAKE FOR THREE DAYS 45 tablet 1   omeprazole (PRILOSEC) 40 MG capsule TAKE 1 CAPSULE (40 MG TOTAL) BY MOUTH DAILY. 90 capsule 0   ondansetron (ZOFRAN) 8 MG tablet Take 1 tablet (8 mg total) by mouth 2 (two) times daily as needed. Start on the third day after carboplatin and AC chemotherapy. 3[place]  tablet 1   predniSONE (DELTASONE) 20 MG tablet Take 1 tablet (20 mg total) by mouth daily with breakfast. 20 tablet 0   prochlorperazine (COMPAZINE) 10 MG tablet Take 1 tablet (10 mg total) by mouth every 6 (six) hours as needed (Nausea or vomiting). 30 tablet 1   No  current facility-administered medications for this visit.   Facility-Administered Medications Ordered in Other Visits  Medication Dose Route Frequency Provider Last Rate Last Admin   filgrastim-aafi (NIVESTYM) 480 MCG/0.8ML injection             PHYSICAL EXAMINATION: ECOG PERFORMANCE STATUS: 1 - Symptomatic but completely ambulatory  Vitals:   03/14/21 1358  BP: (!) 142/90  Pulse: (!) 125  Resp: 18  Temp: (!) 97.5 F (36.4 C)  SpO2: 100%   Filed Weights   03/14/21 1358  Weight: 285 lb 3.2 oz (129.4 kg)    LABORATORY DATA:  I have reviewed the data as listed CMP Latest Ref Rng & Units 03/07/2021 02/28/2021 02/21/2021  Glucose 70 - 99 mg/dL 145(H) 110(H) 144(H)  BUN 6 - 20 mg/dL '12 12 17  ' Creatinine 0.44 - 1.00 mg/dL 0.78 0.85 0.88  Sodium 135 - 145 mmol/L 138 139 140  Potassium 3.5 - 5.1 mmol/L 4.1 3.8 3.5  Chloride 98 - 111 mmol/L 107 106 108  CO2 22 - 32 mmol/L '24 25 23  ' Calcium 8.9 - 10.3 mg/dL 9.5 9.3 9.4  Total Protein 6.5 - 8.1 g/dL 7.7 7.7 7.9  Total Bilirubin 0.3 - 1.2 mg/dL 0.3 0.3 0.4  Alkaline Phos 38 - 126 U/L 62 68 80  AST 15 - 41 U/L 10(L) 14(L) 12(L)  ALT 0 - 44 U/L '13 13 12    ' Lab Results  Component Value Date   WBC 4.9 03/14/2021   HGB 10.6 (L) 03/14/2021   HCT 32.3 (L) 03/14/2021   MCV 92.8 03/14/2021   PLT 248 03/14/2021   NEUTROABS 4.0 03/14/2021    ASSESSMENT & PLAN:  Malignant neoplasm of upper-outer quadrant of right breast in female, estrogen receptor negative ([place]) 10/31/20: Patient palpated a right breast mass x23mo Diagnostic mammogram and UKoreashowed an abnormality and calcifications in the right breast, 8.2cm, with skin thickening and right axillary adenopathy. 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   Treatment plan: 1. Neoadjuvant chemotherapy with Adriamycin and Cytoxan Keytruda 4 followed by Taxol weekly 12 with carboplatin and  Keytruda every 3 weeks x4 followed by 1 year of torted Keytruda maintenance 2. Followed by mastectomy and axillary lymph node dissection  3. Followed by adjuvant radiation therapy UR CC nausea study CT CAP 11/14/2020: Enlarged right axillary lymph nodes, prominent left axillary lymph node.  (Requires an ultrasound) no evidence of metastatic disease in the abdomen or pelvis Bone scan 11/16/2020: No evidence of bone metastases --------------------------------------------------------------------------------------------------------------------------------------------------------- Current treatment: Completed 4 cycles of Adriamycin Cytoxan and Keytruda, today cycle 4 Taxol (will receive every 3 week carboplatin and Keytruda today) Echocardiogram 11/19/2020: EF 70-75 %   Toxicities: 1. Maculopapular rash: Resolved with prednisone 20 mg. 2. Severe fatigue 3.  Tachycardia: I sent a prescription for metoprolol XL 4. Emotional distress:  5.  Gastritis: Sent a prescription for omeprazole 6.  Bone pain due to  Menopausal symptoms: I discussed with her to watch it and if it gets much worse we can consider giving Effexor. Return to clinic weekly for Taxol and every other week for  follow-up with me.     No orders of the defined types were placed in this encounter.  The patient has a good understanding of the overall plan. she agrees with it. she will call with any problems that may develop before the next visit here.  Total time spent: 30 mins including face to face time and time spent for planning, charting and coordination of care  Rulon Eisenmenger, MD, MPH 03/14/2021  I, Thana Ates, am acting as scribe for Dr. Nicholas Lose.  I have reviewed the above documentation for accuracy and completeness, and I agree with the above.

## 2021-03-14 ENCOUNTER — Other Ambulatory Visit: Payer: Self-pay

## 2021-03-14 ENCOUNTER — Inpatient Hospital Stay: Payer: No Typology Code available for payment source

## 2021-03-14 ENCOUNTER — Encounter: Payer: Self-pay | Admitting: *Deleted

## 2021-03-14 ENCOUNTER — Other Ambulatory Visit: Payer: No Typology Code available for payment source

## 2021-03-14 ENCOUNTER — Inpatient Hospital Stay (HOSPITAL_BASED_OUTPATIENT_CLINIC_OR_DEPARTMENT_OTHER): Payer: No Typology Code available for payment source | Admitting: Hematology and Oncology

## 2021-03-14 ENCOUNTER — Inpatient Hospital Stay: Payer: No Typology Code available for payment source | Attending: Hematology and Oncology

## 2021-03-14 ENCOUNTER — Ambulatory Visit: Payer: No Typology Code available for payment source

## 2021-03-14 DIAGNOSIS — Z5112 Encounter for antineoplastic immunotherapy: Secondary | ICD-10-CM | POA: Insufficient documentation

## 2021-03-14 DIAGNOSIS — C50411 Malignant neoplasm of upper-outer quadrant of right female breast: Secondary | ICD-10-CM

## 2021-03-14 DIAGNOSIS — Z95828 Presence of other vascular implants and grafts: Secondary | ICD-10-CM

## 2021-03-14 DIAGNOSIS — C773 Secondary and unspecified malignant neoplasm of axilla and upper limb lymph nodes: Secondary | ICD-10-CM | POA: Diagnosis not present

## 2021-03-14 DIAGNOSIS — Z5111 Encounter for antineoplastic chemotherapy: Secondary | ICD-10-CM | POA: Diagnosis present

## 2021-03-14 DIAGNOSIS — Z171 Estrogen receptor negative status [ER-]: Secondary | ICD-10-CM | POA: Insufficient documentation

## 2021-03-14 DIAGNOSIS — Z79899 Other long term (current) drug therapy: Secondary | ICD-10-CM | POA: Diagnosis not present

## 2021-03-14 LAB — CBC WITH DIFFERENTIAL/PLATELET
Abs Immature Granulocytes: 0.18 K/uL — ABNORMAL HIGH (ref 0.00–0.07)
Basophils Absolute: 0 K/uL (ref 0.0–0.1)
Basophils Relative: 0 %
Eosinophils Absolute: 0 K/uL (ref 0.0–0.5)
Eosinophils Relative: 0 %
HCT: 32.3 % — ABNORMAL LOW (ref 36.0–46.0)
Hemoglobin: 10.6 g/dL — ABNORMAL LOW (ref 12.0–15.0)
Immature Granulocytes: 4 %
Lymphocytes Relative: 12 %
Lymphs Abs: 0.6 K/uL — ABNORMAL LOW (ref 0.7–4.0)
MCH: 30.5 pg (ref 26.0–34.0)
MCHC: 32.8 g/dL (ref 30.0–36.0)
MCV: 92.8 fL (ref 80.0–100.0)
Monocytes Absolute: 0.1 K/uL (ref 0.1–1.0)
Monocytes Relative: 3 %
Neutro Abs: 4 K/uL (ref 1.7–7.7)
Neutrophils Relative %: 81 %
Platelets: 248 K/uL (ref 150–400)
RBC: 3.48 MIL/uL — ABNORMAL LOW (ref 3.87–5.11)
RDW: 15.2 % (ref 11.5–15.5)
WBC: 4.9 K/uL (ref 4.0–10.5)
nRBC: 0 % (ref 0.0–0.2)

## 2021-03-14 LAB — COMPREHENSIVE METABOLIC PANEL
ALT: 19 U/L (ref 0–44)
AST: 16 U/L (ref 15–41)
Albumin: 3.5 g/dL (ref 3.5–5.0)
Alkaline Phosphatase: 85 U/L (ref 38–126)
Anion gap: 9 (ref 5–15)
BUN: 8 mg/dL (ref 6–20)
CO2: 22 mmol/L (ref 22–32)
Calcium: 9.6 mg/dL (ref 8.9–10.3)
Chloride: 110 mmol/L (ref 98–111)
Creatinine, Ser: 0.77 mg/dL (ref 0.44–1.00)
GFR, Estimated: >60 mL/min (ref >60)
Glucose, Bld: 141 mg/dL — ABNORMAL HIGH (ref 70–99)
Potassium: 4 mmol/L (ref 3.5–5.1)
Sodium: 141 mmol/L (ref 135–145)
Total Bilirubin: <0.2 mg/dL — ABNORMAL LOW (ref 0.3–1.2)
Total Protein: 7.9 g/dL (ref 6.5–8.1)

## 2021-03-14 LAB — TSH: TSH: 0.181 u[IU]/mL — ABNORMAL LOW (ref 0.308–3.960)

## 2021-03-14 MED ORDER — METOPROLOL SUCCINATE ER 25 MG PO TB24: 25.0000 mg | ORAL_TABLET | Freq: Every day | ORAL | 3 refills | Status: DC

## 2021-03-14 MED ORDER — HEPARIN SOD (PORK) LOCK FLUSH 100 UNIT/ML IV SOLN
500.0000 [IU] | Freq: Once | INTRAVENOUS | Status: AC | PRN
  Administered 2021-03-14: 500 [IU]

## 2021-03-14 MED ORDER — SODIUM CHLORIDE 0.9 % IV SOLN
10.0000 mg | Freq: Once | INTRAVENOUS | Status: AC
  Administered 2021-03-14: 10 mg via INTRAVENOUS
  Filled 2021-03-14: qty 10

## 2021-03-14 MED ORDER — DIPHENHYDRAMINE HCL 50 MG/ML IJ SOLN
25.0000 mg | Freq: Once | INTRAMUSCULAR | Status: AC
  Administered 2021-03-14: 25 mg via INTRAVENOUS

## 2021-03-14 MED ORDER — OMEPRAZOLE 40 MG PO CPDR: 40.0000 mg | DELAYED_RELEASE_CAPSULE | Freq: Every day | ORAL | 3 refills | Status: DC

## 2021-03-14 MED ORDER — FAMOTIDINE 20 MG IN NS 100 ML IVPB
20.0000 mg | Freq: Once | INTRAVENOUS | Status: AC
  Administered 2021-03-14: 20 mg via INTRAVENOUS

## 2021-03-14 MED ORDER — FAMOTIDINE 20 MG IN NS 100 ML IVPB
INTRAVENOUS | Status: AC
  Filled 2021-03-14: qty 100

## 2021-03-14 MED ORDER — SODIUM CHLORIDE 0.9 % IV SOLN
200.0000 mg | Freq: Once | INTRAVENOUS | Status: AC
  Administered 2021-03-14: 200 mg via INTRAVENOUS
  Filled 2021-03-14: qty 8

## 2021-03-14 MED ORDER — SODIUM CHLORIDE 0.9 % IV SOLN
80.0000 mg/m2 | Freq: Once | INTRAVENOUS | Status: AC
  Administered 2021-03-14: 192 mg via INTRAVENOUS
  Filled 2021-03-14: qty 32

## 2021-03-14 MED ORDER — PALONOSETRON HCL INJECTION 0.25 MG/5ML
0.2500 mg | Freq: Once | INTRAVENOUS | Status: AC
  Administered 2021-03-14: 0.25 mg via INTRAVENOUS

## 2021-03-14 MED ORDER — SODIUM CHLORIDE 0.9 % IV SOLN
150.0000 mg | Freq: Once | INTRAVENOUS | Status: AC
  Administered 2021-03-14: 150 mg via INTRAVENOUS
  Filled 2021-03-14: qty 150

## 2021-03-14 MED ORDER — SODIUM CHLORIDE 0.9% FLUSH
10.0000 mL | INTRAVENOUS | Status: DC | PRN
Start: 2021-03-14 — End: 2021-03-14
  Administered 2021-03-14: 10 mL

## 2021-03-14 MED ORDER — DIPHENHYDRAMINE HCL 50 MG/ML IJ SOLN
INTRAMUSCULAR | Status: AC
  Filled 2021-03-14: qty 1

## 2021-03-14 MED ORDER — PALONOSETRON HCL INJECTION 0.25 MG/5ML
INTRAVENOUS | Status: AC
  Filled 2021-03-14: qty 5

## 2021-03-14 MED ORDER — SODIUM CHLORIDE 0.9 % IV SOLN
Freq: Once | INTRAVENOUS | Status: AC
Start: 2021-03-14 — End: 2021-03-14

## 2021-03-14 MED ORDER — SODIUM CHLORIDE 0.9 % IV SOLN
700.0000 mg | Freq: Once | INTRAVENOUS | Status: AC
  Administered 2021-03-14: 700 mg via INTRAVENOUS
  Filled 2021-03-14: qty 70

## 2021-03-14 MED ORDER — SODIUM CHLORIDE 0.9% FLUSH
10.0000 mL | Freq: Once | INTRAVENOUS | Status: AC
  Administered 2021-03-14: 10 mL

## 2021-03-14 NOTE — Patient Instructions (Signed)
[facility]COL[facility]  Discharge Instructions: Thank you for choosing [facility] to provide your oncology and hematology care.   If you have a lab appointment with the Cancer Center, please go directly to the Cancer Center and check in at the registration area.   Wear comfortable clothing and clothing appropriate for easy access to any Portacath or PICC line.   We strive to give you quality time with your provider. You may need to reschedule your appointment if you arrive late (15 or more minutes).  Arriving late affects you and other patients whose appointments are after yours.  Also, if you miss three or more appointments without notifying the office, you may be dismissed from the clinic at the provider's discretion.      For prescription refill requests, have your pharmacy contact our office and allow 72 hours for refills to be completed.    Today you received the following chemotherapy and/or immunotherapy agents Pembrolizumab, Paclitaxel, and Carboplatin      To help prevent nausea and vomiting after your treatment, we encourage you to take your nausea medication as directed.  BELOW ARE SYMPTOMS THAT SHOULD BE REPORTED IMMEDIATELY: *FEVER GREATER THAN 100.4 F (38 C) OR HIGHER *CHILLS OR SWEATING *NAUSEA AND VOMITING THAT IS NOT CONTROLLED WITH YOUR NAUSEA MEDICATION *UNUSUAL SHORTNESS OF BREATH *UNUSUAL BRUISING OR BLEEDING *URINARY PROBLEMS (pain or burning when urinating, or frequent urination) *BOWEL PROBLEMS (unusual diarrhea, constipation, pain near the anus) TENDERNESS IN MOUTH AND THROAT WITH OR WITHOUT PRESENCE OF ULCERS (sore throat, sores in mouth, or a toothache) UNUSUAL RASH, SWELLING OR PAIN  UNUSUAL VAGINAL DISCHARGE OR ITCHING   Items with * indicate a potential emergency and should be followed up as soon as possible or go to the Emergency Department if any problems should occur.  Please show the CHEMOTHERAPY ALERT CARD or  IMMUNOTHERAPY ALERT CARD at check-in to the Emergency Department and triage nurse.  Should you have questions after your visit or need to cancel or reschedule your appointment, please contact [facility]  Dept: [phone]  and follow the prompts.  Office hours are 8:00 a.m. to 4:30 p.m. Monday - Friday. Please note that voicemails left after 4:00 p.m. may not be returned until the following business day.  We are closed weekends and major holidays. You have access to a nurse at all times for urgent questions. Please call the main number to the clinic Dept: [phone] and follow the prompts.   For any non-urgent questions, you may also contact your provider using MyChart. We now offer e-Visits for anyone 18 and older to request care online for non-urgent symptoms. For details visit [email or web address].   Also download the MyChart app! Go to the app store, search "MyChart", open the app, select [place], and log in with your MyChart username and password.  Due to Covid, a mask is required upon entering the hospital/clinic. If you do not have a mask, one will be given to you upon arrival. For doctor visits, patients may have 1 support person aged 18 or older with them. For treatment visits, patients cannot have anyone with them due to current Covid guidelines and our immunocompromised population.   

## 2021-03-14 NOTE — Progress Notes (Signed)
Per Dr. Lindi Adie, on long treatment days please give '50mg'$  of Diphenhydramine as the premedication.  Verbal order given to given an additional '25mg'$  of Diphenhydramine this cycle d/t treatment plan has order of '25mg'$  which had already been released on tx plan at time of conversation with Dr. Lindi Adie.

## 2021-03-14 NOTE — Assessment & Plan Note (Signed)
10/31/20:Patient palpated a right breast mass x61mo Diagnostic mammogram and UKoreashowed an abnormality and calcifications in the right breast, 8.2cm, with skin thickening and right axillary adenopathy. 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  Treatment plan: 1. Neoadjuvant chemotherapy with Adriamycin and CytoxanKeytruda4 followed by Taxol weekly 12 with carboplatinand Keytrudaevery 3 weeks x471flowed by 1 year of torted Keytruda maintenance 2. Followed bymastectomy and axillary lymph node dissection 3. Followed by adjuvant radiation therapy UR CC nausea study CT CAP 11/14/2020: Enlarged right axillary lymph nodes, prominent left axillary lymph node. (Requires an ultrasound) no evidence of metastatic disease in the abdomen or pelvis Bone scan 11/16/2020: No evidence of bone metastases --------------------------------------------------------------------------------------------------------------------------------------------------------- Current treatment:Completed 4 cycles ofAdriamycin Cytoxan and Keytruda, today cycle 4 Taxol (will receive every 3 week carboplatin and Keytruda today) Echocardiogram5/03/2021: EF70-75%  Toxicities: 1. Maculopapular rash:Resolved withprednisone 20 mg. 2.Intractable nausea and vomiting.: Remarkable improvement with the last cycle 3.Severe fatigue 4.Emotional distress: 5.  Complaint of chest burning and nausea during infusion of Taxol.  Resolved with anaphylaxis treatment.    Menopausal symptoms: I discussed with her to watch it and if it gets much worse we can consider giving Effexor. Return to clinic weekly for Taxol and every other week for follow-up with me.

## 2021-03-14 NOTE — Telephone Encounter (Signed)
Refilled by Dr. Lindi Adie on 8/30

## 2021-03-15 LAB — T4: T4, Total: 8.1 ug/dL (ref 4.5–12.0)

## 2021-03-21 ENCOUNTER — Encounter: Payer: Self-pay | Admitting: *Deleted

## 2021-03-21 ENCOUNTER — Telehealth: Payer: Self-pay | Admitting: Hematology and Oncology

## 2021-03-21 ENCOUNTER — Other Ambulatory Visit: Payer: No Typology Code available for payment source

## 2021-03-21 ENCOUNTER — Ambulatory Visit: Payer: No Typology Code available for payment source

## 2021-03-21 ENCOUNTER — Telehealth: Payer: Self-pay | Admitting: *Deleted

## 2021-03-21 NOTE — Telephone Encounter (Signed)
Scheduled appts per add on approval. Called, not able to get in touch with patient. Called and left msg on her mothers phone

## 2021-03-21 NOTE — Telephone Encounter (Signed)
RN attempt x1 to contact pt regarding missed apt today. No answer, unable to LVM due to VM not being available.

## 2021-03-27 MED FILL — Dexamethasone Sodium Phosphate Inj 100 MG/10ML: INTRAMUSCULAR | Qty: 1 | Status: AC

## 2021-03-27 NOTE — Progress Notes (Signed)
Patient Care Team: Hague, Rosalyn Charters, MD as PCP - General (Internal Medicine) Rockwell Germany, RN as Oncology Nurse Navigator Mauro Kaufmann, RN as Oncology Nurse Navigator Donnie Mesa, MD as Consulting Physician (General Surgery) Nicholas Lose, MD as Consulting Physician (Hematology and Oncology) Gery Pray, MD as Consulting Physician (Radiation Oncology)  DIAGNOSIS:    ICD-10-CM   1. Malignant neoplasm of upper-outer quadrant of right breast in female, estrogen receptor negative ([place])  C50.411    Z17.1       SUMMARY OF ONCOLOGIC HISTORY: Oncology History  Malignant neoplasm of upper-outer quadrant of right breast in female, estrogen receptor negative ([place])  10/31/2020 Initial Diagnosis   Patient palpated a right breast mass x52mo Diagnostic mammogram and UKoreashowed an abnormality and calcifications in the right breast, 8.2cm, with skin thickening and right axillary adenopathy. 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   11/07/2020 Cancer Staging   Staging form: Breast, AJCC 8th Edition - Clinical: Stage IIIC (cT4, cN1, cM0, G3, ER-, PR-, HER2: Equivocal) - Signed by GNicholas Lose MD on 11/07/2020 Histologic grading system: 3 grade system   11/22/2020 -  Chemotherapy   Patient is on Treatment Plan: BREAST PEMBROLIZUMAB + AC Q21D X 4 CYCLES FOLLOWED BY PEMBROLIZUMAB + CARBOPLATIN D1 + PACLITAXEL D1,8,15 Q21D X 4 CYCLES        Genetic Testing   Negative genetic testing. No pathogenic variants identified on the [facility] panel. The report date is 11/28/2020.  The CancerNext-Expanded + RNAinsight gene panel offered by [employer] includes sequencing and rearrangement analysis for the following 77 genes: IP, ALK, APC*, ATM*, AXIN2, BAP1, BARD1, BLM, BMPR1A, BRCA1*, BRCA2*, BRIP1*, CDC73, CDH1*,CDK4, CDKN1B, CDKN2A, CHEK2*, CTNNA1, DICER1, FANCC, FH, FLCN,  GALNT12, KIF1B, LZTR1, MAX, MEN1, MET, MLH1*, MSH2*, MSH3, MSH6*, MUTYH*, NBN, NF1*, NF2, NTHL1, PALB2*, PHOX2B, PMS2*, POT1, PRKAR1A, PTCH1, PTEN*, RAD51C*, RAD51D*,RB1, RECQL, RET, SDHA, SDHAF2, SDHB, SDHC, SDHD, SMAD4, SMARCA4, SMARCB1, SMARCE1, STK11, SUFU, TMEM127, TP53*,TSC1, TSC2, VHL and XRCC2 (sequencing and deletion/duplication); EGFR, EGLN1, HOXB13, KIT, MITF, PDGFRA, POLD1 and POLE (sequencing only); EPCAM and GREM1 (deletion/duplication only).     CHIEF COMPLIANT:  Cycle 5 Taxol  INTERVAL HISTORY: EAleigh White a 45y.o. with above-mentioned history of right breast cancer who completed 4 cycles of neoadjuvant chemotherapy with Adriamycin and Cytoxan Keytruda. She presents to the clinic today for a toxicity check and for cycle 5.  She tells me that apart from thrush and she is done very well.  She is run out of Diflucan.  Did not have too much nausea or vomiting.  Her port is working well today.  ALLERGIES:  is allergic to paclitaxel and penicillins.  MEDICATIONS:  Current Outpatient Medications  Medication Sig Dispense Refill   lidocaine-prilocaine (EMLA) cream Apply to affected area once 30 g 3   metoprolol succinate (TOPROL XL) 25 MG 24 hr tablet Take 1 tablet (25 mg total) by mouth daily. 30 tablet 3   OLANZapine (ZYPREXA) 10 MG tablet TAKE 0.5 TABLETS (5 MG TOTAL) BY MOUTH AT BEDTIME. START THE DAY AFTER CHEMO AND TAKE FOR THREE DAYS 45 tablet 1   omeprazole (PRILOSEC) 40 MG capsule Take 1 capsule (40 mg total) by mouth daily. 30 capsule 3   ondansetron (ZOFRAN) 8 MG tablet Take 1 tablet (8 mg total) by mouth 2 (two) times daily as needed. Start on the third day after carboplatin and [facility]  chem[facility]. 30 tablet 1   prochlorperazine (COMPAZINE) 10 MG tablet Take 1 tablet (10 mg total) by mouth every 6 (six) hours as needed (Nausea or vomiting). 30 tablet 1   No current facility-administered medications for this visit.   Facility-Administered Medications Ordered in Other Visits   Medication Dose Route Frequency Provider Last Rate Last Admin   filgrastim-aafi (NIVESTYM) 480 MCG/0.8ML injection             PHYSICAL EXAMINATION: ECOG PERFORMANCE STATUS: 1 - Symptomatic but completely ambulatory  Vitals:   03/28/21 1335  BP: 126/84  Pulse: (!) 106  Resp: 18  Temp: (!) 97.2 F (36.2 C)  SpO2: 100%   Filed Weights   03/28/21 1335  Weight: 282 lb 8 oz (128.1 kg)    LABORATORY DATA:  I have reviewed the data as listed CMP Latest Ref Rng & Units 03/14/2021 03/07/2021 02/28/2021  Glucose 70 - 99 mg/dL 141(H) 145(H) 110(H)  BUN 6 - 20 mg/dL '8 12 12  ' Creatinine 0.44 - 1.00 mg/dL 0.77 0.78 0.85  Sodium 135 - 145 mmol/L 141 138 139  Potassium 3.5 - 5.1 mmol/L 4.0 4.1 3.8  Chloride 98 - 111 mmol/L 110 107 106  CO2 22 - 32 mmol/L '22 24 25  ' Calcium 8.9 - 10.3 mg/dL 9.6 9.5 9.3  Total Protein 6.5 - 8.1 g/dL 7.9 7.7 7.7  Total Bilirubin 0.3 - 1.2 mg/dL <0.2(L) 0.3 0.3  Alkaline Phos 38 - 126 U/L 85 62 68  AST 15 - 41 U/L 16 10(L) 14(L)  ALT 0 - 44 U/L '19 13 13    ' Lab Results  Component Value Date   WBC 4.2 03/28/2021   HGB 9.7 (L) 03/28/2021   HCT 30.2 (L) 03/28/2021   MCV 93.8 03/28/2021   PLT 334 03/28/2021   NEUTROABS 1.9 03/28/2021    ASSESSMENT & PLAN:  Malignant neoplasm of upper-outer quadrant of right breast in female, estrogen receptor negative ([place]) 10/31/20: Patient palpated a right breast mass x67mo Diagnostic mammogram and UKoreashowed an abnormality and calcifications in the right breast, 8.2cm, with skin thickening and right axillary adenopathy. 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   Treatment plan: 1. Neoadjuvant chemotherapy with Adriamycin and Cytoxan Keytruda 4 followed by Taxol weekly 12 with carboplatin and Keytruda every 3 weeks x4 followed by 1 year of torted Keytruda maintenance 2. Followed by mastectomy and axillary lymph node  dissection  3. Followed by adjuvant radiation therapy UR CC nausea study CT CAP 11/14/2020: Enlarged right axillary lymph nodes, prominent left axillary lymph node.  (Requires an ultrasound) no evidence of metastatic disease in the abdomen or pelvis Bone scan 11/16/2020: No evidence of bone metastases --------------------------------------------------------------------------------------------------------------------------------------------------------- Current treatment: Completed 4 cycles of Adriamycin Cytoxan and Keytruda, today cycle 5 Taxol (will receive every 3 week carboplatin and Keytruda today) Echocardiogram 11/19/2020: EF 70-75 %   Toxicities: 1. Maculopapular rash: Resolved with prednisone 20 mg. 2. Severe fatigue 3.  Tachycardia: On metoprolol XL 4. Emotional distress:  5.  Gastritis: On omeprazole 6.  Thrush: I sent a prescription for Diflucan.   Menopausal symptoms: I discussed with her to watch it and if it gets much worse we can consider giving Effexor. Return to clinic weekly for Taxol and every other week for follow-up with me.    No orders of the defined types were placed in this encounter.  The patient has a good  understanding of the overall plan. she agrees with it. she will call with any problems that may develop before the next visit here.  Total time spent: 30 mins including face to face time and time spent for planning, charting and coordination of care  Rulon Eisenmenger, MD, MPH 03/28/2021  I, Thana Ates, am acting as scribe for Dr. Nicholas Lose.  I have reviewed the above documentation for accuracy and completeness, and I agree with the above.

## 2021-03-28 ENCOUNTER — Inpatient Hospital Stay (HOSPITAL_BASED_OUTPATIENT_CLINIC_OR_DEPARTMENT_OTHER): Payer: No Typology Code available for payment source | Admitting: Hematology and Oncology

## 2021-03-28 ENCOUNTER — Inpatient Hospital Stay: Payer: No Typology Code available for payment source

## 2021-03-28 ENCOUNTER — Other Ambulatory Visit: Payer: No Typology Code available for payment source

## 2021-03-28 ENCOUNTER — Other Ambulatory Visit: Payer: Self-pay

## 2021-03-28 ENCOUNTER — Ambulatory Visit: Payer: No Typology Code available for payment source | Admitting: Hematology and Oncology

## 2021-03-28 VITALS — HR 99

## 2021-03-28 DIAGNOSIS — C50411 Malignant neoplasm of upper-outer quadrant of right female breast: Secondary | ICD-10-CM

## 2021-03-28 DIAGNOSIS — Z171 Estrogen receptor negative status [ER-]: Secondary | ICD-10-CM

## 2021-03-28 DIAGNOSIS — Z95828 Presence of other vascular implants and grafts: Secondary | ICD-10-CM

## 2021-03-28 DIAGNOSIS — Z5112 Encounter for antineoplastic immunotherapy: Secondary | ICD-10-CM | POA: Diagnosis not present

## 2021-03-28 LAB — CBC WITH DIFFERENTIAL/PLATELET
Abs Immature Granulocytes: 0.01 10*3/uL (ref 0.00–0.07)
Basophils Absolute: 0 10*3/uL (ref 0.0–0.1)
Basophils Relative: 1 %
Eosinophils Absolute: 0 10*3/uL (ref 0.0–0.5)
Eosinophils Relative: 1 %
HCT: 30.2 % — ABNORMAL LOW (ref 36.0–46.0)
Hemoglobin: 9.7 g/dL — ABNORMAL LOW (ref 12.0–15.0)
Immature Granulocytes: 0 %
Lymphocytes Relative: 39 %
Lymphs Abs: 1.6 10*3/uL (ref 0.7–4.0)
MCH: 30.1 pg (ref 26.0–34.0)
MCHC: 32.1 g/dL (ref 30.0–36.0)
MCV: 93.8 fL (ref 80.0–100.0)
Monocytes Absolute: 0.6 10*3/uL (ref 0.1–1.0)
Monocytes Relative: 15 %
Neutro Abs: 1.9 10*3/uL (ref 1.7–7.7)
Neutrophils Relative %: 44 %
Platelets: 334 10*3/uL (ref 150–400)
RBC: 3.22 MIL/uL — ABNORMAL LOW (ref 3.87–5.11)
RDW: 14.6 % (ref 11.5–15.5)
WBC: 4.2 10*3/uL (ref 4.0–10.5)
nRBC: 0 % (ref 0.0–0.2)

## 2021-03-28 LAB — COMPREHENSIVE METABOLIC PANEL
ALT: 12 U/L (ref 0–44)
AST: 14 U/L — ABNORMAL LOW (ref 15–41)
Albumin: 3.3 g/dL — ABNORMAL LOW (ref 3.5–5.0)
Alkaline Phosphatase: 82 U/L (ref 38–126)
Anion gap: 8 (ref 5–15)
BUN: 11 mg/dL (ref 6–20)
CO2: 25 mmol/L (ref 22–32)
Calcium: 9.3 mg/dL (ref 8.9–10.3)
Chloride: 107 mmol/L (ref 98–111)
Creatinine, Ser: 0.78 mg/dL (ref 0.44–1.00)
GFR, Estimated: >60 mL/min (ref >60)
Glucose, Bld: 119 mg/dL — ABNORMAL HIGH (ref 70–99)
Potassium: 3.5 mmol/L (ref 3.5–5.1)
Sodium: 140 mmol/L (ref 135–145)
Total Bilirubin: 0.2 mg/dL — ABNORMAL LOW (ref 0.3–1.2)
Total Protein: 7.5 g/dL (ref 6.5–8.1)

## 2021-03-28 LAB — TSH: TSH: 0.312 u[IU]/mL (ref 0.308–3.960)

## 2021-03-28 MED ORDER — FAMOTIDINE 20 MG IN NS 100 ML IVPB
INTRAVENOUS | Status: AC
  Filled 2021-03-28: qty 100

## 2021-03-28 MED ORDER — SODIUM CHLORIDE 0.9 % IV SOLN: Freq: Once | INTRAVENOUS | Status: AC

## 2021-03-28 MED ORDER — FLUCONAZOLE 100 MG PO TABS: 100.0000 mg | ORAL_TABLET | Freq: Every day | ORAL | 0 refills | Status: DC

## 2021-03-28 MED ORDER — SODIUM CHLORIDE 0.9 % IV SOLN
10.0000 mg | Freq: Once | INTRAVENOUS | Status: AC
  Administered 2021-03-28: 10 mg via INTRAVENOUS
  Filled 2021-03-28: qty 10

## 2021-03-28 MED ORDER — SODIUM CHLORIDE 0.9% FLUSH
10.0000 mL | INTRAVENOUS | Status: DC | PRN
  Administered 2021-03-28: 10 mL

## 2021-03-28 MED ORDER — HEPARIN SOD (PORK) LOCK FLUSH 100 UNIT/ML IV SOLN
500.0000 [IU] | Freq: Once | INTRAVENOUS | Status: AC | PRN
  Administered 2021-03-28: 500 [IU]

## 2021-03-28 MED ORDER — PACLITAXEL CHEMO INJECTION 300 MG/50ML
80.0000 mg/m2 | Freq: Once | INTRAVENOUS | Status: AC
  Administered 2021-03-28: 192 mg via INTRAVENOUS
  Filled 2021-03-28: qty 32

## 2021-03-28 MED ORDER — FAMOTIDINE 20 MG IN NS 100 ML IVPB
20.0000 mg | Freq: Once | INTRAVENOUS | Status: AC
  Administered 2021-03-28: 20 mg via INTRAVENOUS

## 2021-03-28 MED ORDER — DIPHENHYDRAMINE HCL 50 MG/ML IJ SOLN
25.0000 mg | Freq: Once | INTRAMUSCULAR | Status: AC
  Administered 2021-03-28: 25 mg via INTRAVENOUS
  Filled 2021-03-28: qty 1

## 2021-03-28 MED ORDER — SODIUM CHLORIDE 0.9% FLUSH
10.0000 mL | Freq: Once | INTRAVENOUS | Status: AC
  Administered 2021-03-28: 10 mL

## 2021-03-28 NOTE — Patient Instructions (Signed)
[facility]  Discharge Instructions: Thank you for choosing [facility] to provide your oncology and hematology care.   If you have a lab appointment with the Cancer Center, please go directly to the Cancer Center and check in at the registration area.   Wear comfortable clothing and clothing appropriate for easy access to any Portacath or PICC line.   We strive to give you quality time with your provider. You may need to reschedule your appointment if you arrive late (15 or more minutes).  Arriving late affects you and other patients whose appointments are after yours.  Also, if you miss three or more appointments without notifying the office, you may be dismissed from the clinic at the provider's discretion.      For prescription refill requests, have your pharmacy contact our office and allow 72 hours for refills to be completed.    Today you received the following chemotherapy and/or immunotherapy agent: Paclitaxel (Taxol)   To help prevent nausea and vomiting after your treatment, we encourage you to take your nausea medication as directed.  BELOW ARE SYMPTOMS THAT SHOULD BE REPORTED IMMEDIATELY: *FEVER GREATER THAN 100.4 F (38 C) OR HIGHER *CHILLS OR SWEATING *NAUSEA AND VOMITING THAT IS NOT CONTROLLED WITH YOUR NAUSEA MEDICATION *UNUSUAL SHORTNESS OF BREATH *UNUSUAL BRUISING OR BLEEDING *URINARY PROBLEMS (pain or burning when urinating, or frequent urination) *BOWEL PROBLEMS (unusual diarrhea, constipation, pain near the anus) TENDERNESS IN MOUTH AND THROAT WITH OR WITHOUT PRESENCE OF ULCERS (sore throat, sores in mouth, or a toothache) UNUSUAL RASH, SWELLING OR PAIN  UNUSUAL VAGINAL DISCHARGE OR ITCHING   Items with * indicate a potential emergency and should be followed up as soon as possible or go to the Emergency Department if any problems should occur.  Please show the CHEMOTHERAPY ALERT CARD or IMMUNOTHERAPY ALERT CARD at  check-in to the Emergency Department and triage nurse.  Should you have questions after your visit or need to cancel or reschedule your appointment, please contact [facility]  Dept: [phone]  and follow the prompts.  Office hours are 8:00 a.m. to 4:30 p.m. Monday - Friday. Please note that voicemails left after 4:00 p.m. may not be returned until the following business day.  We are closed weekends and major holidays. You have access to a nurse at all times for urgent questions. Please call the main number to the clinic Dept: [phone] and follow the prompts.   For any non-urgent questions, you may also contact your provider using MyChart. We now offer e-Visits for anyone 18 and older to request care online for non-urgent symptoms. For details visit [email or web address].   Also download the MyChart app! Go to the app store, search "MyChart", open the app, select [facility], and log in with your MyChart username and password.  Due to Covid, a mask is required upon entering the hospital/clinic. If you do not have a mask, one will be given to you upon arrival. For doctor visits, patients may have 1 support person aged 18 or older with them. For treatment visits, patients cannot have anyone with them due to current Covid guidelines and our immunocompromised population.  

## 2021-03-28 NOTE — Progress Notes (Signed)
Confirmed w/ Dr. Lindi Adie - today is Day 8 Taxol Next week will be Day 15; date moved. Dr. Lindi Adie aware pt needs inj appts next weekend.  Kennith Center, Pharm.D., CPP 03/28/2021'@2'$ :45 PM

## 2021-03-28 NOTE — Assessment & Plan Note (Signed)
10/31/20:Patient palpated a right breast mass x49mo Diagnostic mammogram and UKoreashowed an abnormality and calcifications in the right breast, 8.2cm, with skin thickening and right axillary adenopathy. 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  Treatment plan: 1. Neoadjuvant chemotherapy with Adriamycin and CytoxanKeytruda4 followed by Taxol weekly 12 with carboplatinand Keytrudaevery 3 weeks x463flowed by 1 year of torted Keytruda maintenance 2. Followed bymastectomy and axillary lymph node dissection 3. Followed by adjuvant radiation therapy UR CC nausea study CT CAP 11/14/2020: Enlarged right axillary lymph nodes, prominent left axillary lymph node. (Requires an ultrasound) no evidence of metastatic disease in the abdomen or pelvis Bone scan 11/16/2020: No evidence of bone metastases --------------------------------------------------------------------------------------------------------------------------------------------------------- Current treatment:Completed 4 cycles ofAdriamycin Cytoxan and Keytruda, today cycle5Taxol (will receive every 3 week carboplatin and Keytruda today) Echocardiogram5/03/2021: EF70-75%  Toxicities: 1. Maculopapular rash:Resolved withprednisone 20 mg. 2.Severe fatigue 3.  Tachycardia: I sent a prescription for metoprolol XL 4.Emotional distress: 5.  Gastritis: Sent a prescription for omeprazole 6.  Bone pain due to  Menopausal symptoms: I discussed with her to watch it and if it gets much worse we can consider giving Effexor. Return to clinic weekly for Taxol and every other week for follow-up with me.

## 2021-03-29 LAB — T4: T4, Total: 8.2 ug/dL (ref 4.5–12.0)

## 2021-04-03 MED FILL — Dexamethasone Sodium Phosphate Inj 100 MG/10ML: INTRAMUSCULAR | Qty: 1 | Status: AC

## 2021-04-04 ENCOUNTER — Inpatient Hospital Stay: Payer: No Typology Code available for payment source

## 2021-04-04 ENCOUNTER — Other Ambulatory Visit: Payer: Self-pay

## 2021-04-04 VITALS — BP 127/86 | HR 96 | Temp 99.1°F | Resp 16 | Wt 281.0 lb

## 2021-04-04 DIAGNOSIS — Z171 Estrogen receptor negative status [ER-]: Secondary | ICD-10-CM

## 2021-04-04 DIAGNOSIS — C50411 Malignant neoplasm of upper-outer quadrant of right female breast: Secondary | ICD-10-CM

## 2021-04-04 DIAGNOSIS — Z95828 Presence of other vascular implants and grafts: Secondary | ICD-10-CM

## 2021-04-04 DIAGNOSIS — Z5112 Encounter for antineoplastic immunotherapy: Secondary | ICD-10-CM | POA: Diagnosis not present

## 2021-04-04 LAB — CBC WITH DIFFERENTIAL/PLATELET
Abs Immature Granulocytes: 0.02 10*3/uL (ref 0.00–0.07)
Basophils Absolute: 0 10*3/uL (ref 0.0–0.1)
Basophils Relative: 1 %
Eosinophils Absolute: 0 10*3/uL (ref 0.0–0.5)
Eosinophils Relative: 1 %
HCT: 30.6 % — ABNORMAL LOW (ref 36.0–46.0)
Hemoglobin: 9.8 g/dL — ABNORMAL LOW (ref 12.0–15.0)
Immature Granulocytes: 0 %
Lymphocytes Relative: 37 %
Lymphs Abs: 2.2 10*3/uL (ref 0.7–4.0)
MCH: 29.9 pg (ref 26.0–34.0)
MCHC: 32 g/dL (ref 30.0–36.0)
MCV: 93.3 fL (ref 80.0–100.0)
Monocytes Absolute: 0.4 10*3/uL (ref 0.1–1.0)
Monocytes Relative: 7 %
Neutro Abs: 3.3 10*3/uL (ref 1.7–7.7)
Neutrophils Relative %: 54 %
Platelets: 264 10*3/uL (ref 150–400)
RBC: 3.28 MIL/uL — ABNORMAL LOW (ref 3.87–5.11)
RDW: 14.6 % (ref 11.5–15.5)
WBC: 5.9 10*3/uL (ref 4.0–10.5)
nRBC: 0 % (ref 0.0–0.2)

## 2021-04-04 LAB — COMPREHENSIVE METABOLIC PANEL
ALT: 11 U/L (ref 0–44)
AST: 13 U/L — ABNORMAL LOW (ref 15–41)
Albumin: 3.4 g/dL — ABNORMAL LOW (ref 3.5–5.0)
Alkaline Phosphatase: 79 U/L (ref 38–126)
Anion gap: 8 (ref 5–15)
BUN: 10 mg/dL (ref 6–20)
CO2: 24 mmol/L (ref 22–32)
Calcium: 9.4 mg/dL (ref 8.9–10.3)
Chloride: 107 mmol/L (ref 98–111)
Creatinine, Ser: 0.79 mg/dL (ref 0.44–1.00)
GFR, Estimated: >60 mL/min (ref >60)
Glucose, Bld: 96 mg/dL (ref 70–99)
Potassium: 4.2 mmol/L (ref 3.5–5.1)
Sodium: 139 mmol/L (ref 135–145)
Total Bilirubin: 0.2 mg/dL — ABNORMAL LOW (ref 0.3–1.2)
Total Protein: 7.8 g/dL (ref 6.5–8.1)

## 2021-04-04 LAB — TSH: TSH: 0.436 u[IU]/mL (ref 0.308–3.960)

## 2021-04-04 MED ORDER — FAMOTIDINE 20 MG IN NS 100 ML IVPB
20.0000 mg | Freq: Once | INTRAVENOUS | Status: AC
  Administered 2021-04-04: 20 mg via INTRAVENOUS
  Filled 2021-04-04: qty 100

## 2021-04-04 MED ORDER — SODIUM CHLORIDE 0.9% FLUSH
10.0000 mL | Freq: Once | INTRAVENOUS | Status: AC
  Administered 2021-04-04: 10 mL

## 2021-04-04 MED ORDER — HEPARIN SOD (PORK) LOCK FLUSH 100 UNIT/ML IV SOLN
500.0000 [IU] | Freq: Once | INTRAVENOUS | Status: AC | PRN
  Administered 2021-04-04: 500 [IU]

## 2021-04-04 MED ORDER — DEXAMETHASONE SODIUM PHOSPHATE 100 MG/10ML IJ SOLN
10.0000 mg | Freq: Once | INTRAMUSCULAR | Status: AC
  Administered 2021-04-04: 10 mg via INTRAVENOUS
  Filled 2021-04-04: qty 1
  Filled 2021-04-04: qty 10

## 2021-04-04 MED ORDER — SODIUM CHLORIDE 0.9 % IV SOLN: Freq: Once | INTRAVENOUS | Status: AC

## 2021-04-04 MED ORDER — SODIUM CHLORIDE 0.9% FLUSH
10.0000 mL | INTRAVENOUS | Status: DC | PRN
  Administered 2021-04-04: 10 mL

## 2021-04-04 MED ORDER — SODIUM CHLORIDE 0.9 % IV SOLN
80.0000 mg/m2 | Freq: Once | INTRAVENOUS | Status: AC
  Administered 2021-04-04: 192 mg via INTRAVENOUS
  Filled 2021-04-04: qty 32

## 2021-04-04 MED ORDER — DIPHENHYDRAMINE HCL 50 MG/ML IJ SOLN
25.0000 mg | Freq: Once | INTRAMUSCULAR | Status: AC
  Administered 2021-04-04: 25 mg via INTRAVENOUS
  Filled 2021-04-04: qty 1

## 2021-04-04 NOTE — Patient Instructions (Signed)
[facility]  Discharge Instructions: Thank you for choosing [facility] to provide your oncology and hematology care.   If you have a lab appointment with the Cancer Center, please go directly to the Cancer Center and check in at the registration area.   Wear comfortable clothing and clothing appropriate for easy access to any Portacath or PICC line.   We strive to give you quality time with your provider. You may need to reschedule your appointment if you arrive late (15 or more minutes).  Arriving late affects you and other patients whose appointments are after yours.  Also, if you miss three or more appointments without notifying the office, you may be dismissed from the clinic at the provider's discretion.      For prescription refill requests, have your pharmacy contact our office and allow 72 hours for refills to be completed.    Today you received the following chemotherapy and/or immunotherapy agent: Paclitaxel (Taxol)   To help prevent nausea and vomiting after your treatment, we encourage you to take your nausea medication as directed.  BELOW ARE SYMPTOMS THAT SHOULD BE REPORTED IMMEDIATELY: *FEVER GREATER THAN 100.4 F (38 C) OR HIGHER *CHILLS OR SWEATING *NAUSEA AND VOMITING THAT IS NOT CONTROLLED WITH YOUR NAUSEA MEDICATION *UNUSUAL SHORTNESS OF BREATH *UNUSUAL BRUISING OR BLEEDING *URINARY PROBLEMS (pain or burning when urinating, or frequent urination) *BOWEL PROBLEMS (unusual diarrhea, constipation, pain near the anus) TENDERNESS IN MOUTH AND THROAT WITH OR WITHOUT PRESENCE OF ULCERS (sore throat, sores in mouth, or a toothache) UNUSUAL RASH, SWELLING OR PAIN  UNUSUAL VAGINAL DISCHARGE OR ITCHING   Items with * indicate a potential emergency and should be followed up as soon as possible or go to the Emergency Department if any problems should occur.  Please show the CHEMOTHERAPY ALERT CARD or IMMUNOTHERAPY ALERT CARD at  check-in to the Emergency Department and triage nurse.  Should you have questions after your visit or need to cancel or reschedule your appointment, please contact [facility]  Dept: [phone]  and follow the prompts.  Office hours are 8:00 a.m. to 4:30 p.m. Monday - Friday. Please note that voicemails left after 4:00 p.m. may not be returned until the following business day.  We are closed weekends and major holidays. You have access to a nurse at all times for urgent questions. Please call the main number to the clinic Dept: [phone] and follow the prompts.   For any non-urgent questions, you may also contact your provider using MyChart. We now offer e-Visits for anyone 18 and older to request care online for non-urgent symptoms. For details visit [email or web address].   Also download the MyChart app! Go to the app store, search "MyChart", open the app, select , and log in with your MyChart username and password.  Due to Covid, a mask is required upon entering the hospital/clinic. If you do not have a mask, one will be given to you upon arrival. For doctor visits, patients may have 1 support person aged 18 or older with them. For treatment visits, patients cannot have anyone with them due to current Covid guidelines and our immunocompromised population.  

## 2021-04-05 ENCOUNTER — Ambulatory Visit: Payer: No Typology Code available for payment source

## 2021-04-05 LAB — T4: T4, Total: 8 ug/dL (ref 4.5–12.0)

## 2021-04-05 NOTE — Progress Notes (Signed)
The following biosimilar Zarxio (filgrastim-sndz) has been selected for use in this patient.  Kennith Center, Pharm.D., CPP 04/05/2021@10 :58 AM

## 2021-04-06 ENCOUNTER — Inpatient Hospital Stay: Payer: No Typology Code available for payment source

## 2021-04-08 ENCOUNTER — Ambulatory Visit: Payer: No Typology Code available for payment source

## 2021-04-10 MED FILL — Fosaprepitant Dimeglumine For IV Infusion 150 MG (Base Eq): INTRAVENOUS | Qty: 5 | Status: AC

## 2021-04-10 MED FILL — Dexamethasone Sodium Phosphate Inj 100 MG/10ML: INTRAMUSCULAR | Qty: 1 | Status: AC

## 2021-04-10 NOTE — Progress Notes (Signed)
Patient Care Team: Hague, Rosalyn Charters, MD as PCP - General (Internal Medicine) Rockwell Germany, RN as Oncology Nurse Navigator Mauro Kaufmann, RN as Oncology Nurse Navigator Donnie Mesa, MD as Consulting Physician (General Surgery) Nicholas Lose, MD as Consulting Physician (Hematology and Oncology) Gery Pray, MD as Consulting Physician (Radiation Oncology)  DIAGNOSIS:    ICD-10-CM   1. Malignant neoplasm of upper-outer quadrant of right breast in female, estrogen receptor negative ([place])  C50.411    Z17.1       SUMMARY OF ONCOLOGIC HISTORY: Oncology History  Malignant neoplasm of upper-outer quadrant of right breast in female, estrogen receptor negative ([place])  10/31/2020 Initial Diagnosis   Patient palpated a right breast mass x26mo Diagnostic mammogram and UKoreashowed an abnormality and calcifications in the right breast, 8.2cm, with skin thickening and right axillary adenopathy. 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   11/07/2020 Cancer Staging   Staging form: Breast, AJCC 8th Edition - Clinical: Stage IIIC (cT4, cN1, cM0, G3, ER-, PR-, HER2: Equivocal) - Signed by GNicholas Lose MD on 11/07/2020 Histologic grading system: 3 grade system   11/22/2020 -  Chemotherapy   Patient is on Treatment Plan: BREAST PEMBROLIZUMAB + AC Q21D X 4 CYCLES FOLLOWED BY PEMBROLIZUMAB + CARBOPLATIN D1 + PACLITAXEL D1,8,15 Q21D X 4 CYCLES        Genetic Testing   Negative genetic testing. No pathogenic variants identified on the [facility] panel. The report date is 11/28/2020.  The CancerNext-Expanded + RNAinsight gene panel offered by [employer] includes sequencing and rearrangement analysis for the following 77 genes: IP, ALK, APC*, ATM*, AXIN2, BAP1, BARD1, BLM, BMPR1A, BRCA1*, BRCA2*, BRIP1*, CDC73, CDH1*,CDK4, CDKN1B, CDKN2A, CHEK2*, CTNNA1, DICER1, FANCC, FH, FLCN,  GALNT12, KIF1B, LZTR1, MAX, MEN1, MET, MLH1*, MSH2*, MSH3, MSH6*, MUTYH*, NBN, NF1*, NF2, NTHL1, PALB2*, PHOX2B, PMS2*, POT1, PRKAR1A, PTCH1, PTEN*, RAD51C*, RAD51D*,RB1, RECQL, RET, SDHA, SDHAF2, SDHB, SDHC, SDHD, SMAD4, SMARCA4, SMARCB1, SMARCE1, STK11, SUFU, TMEM127, TP53*,TSC1, TSC2, VHL and XRCC2 (sequencing and deletion/duplication); EGFR, EGLN1, HOXB13, KIT, MITF, PDGFRA, POLD1 and POLE (sequencing only); EPCAM and GREM1 (deletion/duplication only).     CHIEF COMPLIANT: Cycle 7 Taxol  INTERVAL HISTORY: EDarilyn Storbeckis a 45y.o. with above-mentioned history of right breast cancer who completed 4 cycles of neoadjuvant chemotherapy with Adriamycin and Cytoxan Keytruda. She presents to the clinic today for a toxicity check and for cycle 7.  Her major complaint is profound fatigue but at the same time difficulty with sleeping as well.  She is looking forward to getting Benadryl and taking a nap during treatment.  She is emotionally very labile today because of the fatigue issues.  ALLERGIES:  is allergic to paclitaxel and penicillins.  MEDICATIONS:  Current Outpatient Medications  Medication Sig Dispense Refill   fluconazole (DIFLUCAN) 100 MG tablet Take 1 tablet (100 mg total) by mouth daily. 20 tablet 0   lidocaine-prilocaine (EMLA) cream Apply to affected area once 30 g 3   metoprolol succinate (TOPROL XL) 25 MG 24 hr tablet Take 1 tablet (25 mg total) by mouth daily. 30 tablet 3   OLANZapine (ZYPREXA) 10 MG tablet TAKE 0.5 TABLETS (5 MG TOTAL) BY MOUTH AT BEDTIME. START THE DAY AFTER CHEMO AND TAKE FOR THREE DAYS 45 tablet 1   omeprazole (PRILOSEC) 40 MG capsule Take 1 capsule (40 mg total) by mouth daily. 30 capsule 3   ondansetron (ZOFRAN) 8 MG  tablet Take 1 tablet (8 mg total) by mouth 2 (two) times daily as needed. Start on the third day after carboplatin and AC chemotherapy. 30 tablet 1   prochlorperazine (COMPAZINE) 10 MG tablet Take 1 tablet (10 mg total) by mouth every 6 (six) hours  as needed (Nausea or vomiting). 30 tablet 1   No current facility-administered medications for this visit.   Facility-Administered Medications Ordered in Other Visits  Medication Dose Route Frequency Provider Last Rate Last Admin   filgrastim-aafi (NIVESTYM) 480 MCG/0.8ML injection             PHYSICAL EXAMINATION: ECOG PERFORMANCE STATUS: 1 - Symptomatic but completely ambulatory  Vitals:   04/11/21 1125  BP: (!) 144/87  Pulse: 93  Resp: 19  Temp: (!) 97.3 F (36.3 C)  SpO2: 99%   Filed Weights   04/11/21 1125  Weight: 279 lb 9.6 oz (126.8 kg)     LABORATORY DATA:  I have reviewed the data as listed CMP Latest Ref Rng & Units 04/11/2021 04/04/2021 03/28/2021  Glucose 70 - 99 mg/dL 154(H) 96 119(H)  BUN 6 - 20 mg/dL _0 Creatinine 0.44 - 1.00 mg/dL 0.83 0.79 0.78  Sodium 135 - 145 mmol/L 139 139 140  Potassium 3.5 - 5.1 mmol/L 3.7 4.2 3.5  Chloride 98 - 111 mmol/L 107 107 107  CO2 22 - 32 mmol/L 21(L) 24 25  Calcium 8.9 - 10.3 mg/dL 9.2 9.4 9.3  Total Protein 6.5 - 8.1 g/dL 7.7 7.8 7.5  Total Bilirubin 0.3 - 1.2 mg/dL 0.3 0.2(L) 0.2(L)  Alkaline Phos 38 - 126 U/L 72 79 82  AST 15 - 41 U/L 10(L) 13(L) 14(L)  ALT 0 - 44 U/L _1 Lab Results  Component Value Date   WBC 5.1 04/11/2021   HGB 9.5 (L) 04/11/2021   HCT 29.6 (L) 04/11/2021   MCV 92.8 04/11/2021   PLT 209 04/11/2021   NEUTROABS 3.5 04/11/2021    ASSESSMENT & PLAN:  Malignant neoplasm of upper-outer quadrant of right breast in female, estrogen receptor negative ([place]) 10/31/20: Patient palpated a right breast mass x37mo Diagnostic mammogram and UKoreashowed an abnormality and calcifications in the right breast, 8.2cm, with skin thickening and right axillary adenopathy. 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   Treatment plan: 1. Neoadjuvant chemotherapy with Adriamycin and Cytoxan Keytruda 4  followed by Taxol weekly 12 with carboplatin and Keytruda every 3 weeks x4 followed by 1 year of torted Keytruda maintenance 2. Followed by mastectomy and axillary lymph node dissection  3. Followed by adjuvant radiation therapy UR CC nausea study CT CAP 11/14/2020: Enlarged right axillary lymph nodes, prominent left axillary lymph node.  (Requires an ultrasound) no evidence of metastatic disease in the abdomen or pelvis Bone scan 11/16/2020: No evidence of bone metastases --------------------------------------------------------------------------------------------------------------------------------------------------------- Current treatment: Completed 4 cycles of Adriamycin Cytoxan and Keytruda, today cycle 7 Taxol (will receive every 3 week carboplatin and Keytruda today) Echocardiogram 11/19/2020: EF 70-75 %   Toxicities: 1. Maculopapular rash: Resolved with prednisone 20 mg. 2. Severe fatigue: I recommended administering B12 injections weekly with each treatment. 3.  Tachycardia: On metoprolol XL 4. Emotional distress: Still continues to be a challenge  5.  Gastritis: On omeprazole 6.  Thrush: I sent a prescription for Diflucan.    Menopausal symptoms: I discussed with her to watch it and if it gets  much worse we can consider giving Effexor. Return to clinic weekly for Taxol and every other week for follow-up with me.      No orders of the defined types were placed in this encounter.  The patient has a good understanding of the overall plan. she agrees with it. she will call with any problems that may develop before the next visit here.  Total time spent: 30 mins including face to face time and time spent for planning, charting and coordination of care  Rulon Eisenmenger, MD, MPH 04/11/2021  I, Thana Ates, am acting as scribe for Dr. Nicholas Lose.  I have reviewed the above documentation for accuracy and completeness, and I agree with the above.

## 2021-04-11 ENCOUNTER — Inpatient Hospital Stay: Payer: No Typology Code available for payment source

## 2021-04-11 ENCOUNTER — Other Ambulatory Visit: Payer: Self-pay

## 2021-04-11 ENCOUNTER — Inpatient Hospital Stay (HOSPITAL_BASED_OUTPATIENT_CLINIC_OR_DEPARTMENT_OTHER): Payer: No Typology Code available for payment source | Admitting: Hematology and Oncology

## 2021-04-11 DIAGNOSIS — Z171 Estrogen receptor negative status [ER-]: Secondary | ICD-10-CM

## 2021-04-11 DIAGNOSIS — Z95828 Presence of other vascular implants and grafts: Secondary | ICD-10-CM

## 2021-04-11 DIAGNOSIS — C50411 Malignant neoplasm of upper-outer quadrant of right female breast: Secondary | ICD-10-CM | POA: Diagnosis not present

## 2021-04-11 DIAGNOSIS — Z5112 Encounter for antineoplastic immunotherapy: Secondary | ICD-10-CM | POA: Diagnosis not present

## 2021-04-11 LAB — COMPREHENSIVE METABOLIC PANEL
ALT: 8 U/L (ref 0–44)
AST: 10 U/L — ABNORMAL LOW (ref 15–41)
Albumin: 3.4 g/dL — ABNORMAL LOW (ref 3.5–5.0)
Alkaline Phosphatase: 72 U/L (ref 38–126)
Anion gap: 11 (ref 5–15)
BUN: 13 mg/dL (ref 6–20)
CO2: 21 mmol/L — ABNORMAL LOW (ref 22–32)
Calcium: 9.2 mg/dL (ref 8.9–10.3)
Chloride: 107 mmol/L (ref 98–111)
Creatinine, Ser: 0.83 mg/dL (ref 0.44–1.00)
GFR, Estimated: >60 mL/min (ref >60)
Glucose, Bld: 154 mg/dL — ABNORMAL HIGH (ref 70–99)
Potassium: 3.7 mmol/L (ref 3.5–5.1)
Sodium: 139 mmol/L (ref 135–145)
Total Bilirubin: 0.3 mg/dL (ref 0.3–1.2)
Total Protein: 7.7 g/dL (ref 6.5–8.1)

## 2021-04-11 LAB — CBC WITH DIFFERENTIAL/PLATELET
Abs Immature Granulocytes: 0.03 10*3/uL (ref 0.00–0.07)
Basophils Absolute: 0 10*3/uL (ref 0.0–0.1)
Basophils Relative: 0 %
Eosinophils Absolute: 0 10*3/uL (ref 0.0–0.5)
Eosinophils Relative: 1 %
HCT: 29.6 % — ABNORMAL LOW (ref 36.0–46.0)
Hemoglobin: 9.5 g/dL — ABNORMAL LOW (ref 12.0–15.0)
Immature Granulocytes: 1 %
Lymphocytes Relative: 27 %
Lymphs Abs: 1.4 10*3/uL (ref 0.7–4.0)
MCH: 29.8 pg (ref 26.0–34.0)
MCHC: 32.1 g/dL (ref 30.0–36.0)
MCV: 92.8 fL (ref 80.0–100.0)
Monocytes Absolute: 0.2 10*3/uL (ref 0.1–1.0)
Monocytes Relative: 5 %
Neutro Abs: 3.5 10*3/uL (ref 1.7–7.7)
Neutrophils Relative %: 66 %
Platelets: 209 10*3/uL (ref 150–400)
RBC: 3.19 MIL/uL — ABNORMAL LOW (ref 3.87–5.11)
RDW: 14.8 % (ref 11.5–15.5)
WBC: 5.1 10*3/uL (ref 4.0–10.5)
nRBC: 0 % (ref 0.0–0.2)

## 2021-04-11 LAB — TSH: TSH: 0.161 u[IU]/mL — ABNORMAL LOW (ref 0.308–3.960)

## 2021-04-11 MED ORDER — DIPHENHYDRAMINE HCL 50 MG/ML IJ SOLN
25.0000 mg | Freq: Once | INTRAMUSCULAR | Status: AC
  Administered 2021-04-11: 25 mg via INTRAVENOUS
  Filled 2021-04-11: qty 1

## 2021-04-11 MED ORDER — SODIUM CHLORIDE 0.9 % IV SOLN
700.0000 mg | Freq: Once | INTRAVENOUS | Status: AC
  Administered 2021-04-11: 700 mg via INTRAVENOUS
  Filled 2021-04-11: qty 70

## 2021-04-11 MED ORDER — SODIUM CHLORIDE 0.9 % IV SOLN
150.0000 mg | Freq: Once | INTRAVENOUS | Status: AC
  Administered 2021-04-11: 150 mg via INTRAVENOUS
  Filled 2021-04-11: qty 150

## 2021-04-11 MED ORDER — SODIUM CHLORIDE 0.9 % IV SOLN: Freq: Once | INTRAVENOUS | Status: AC

## 2021-04-11 MED ORDER — SODIUM CHLORIDE 0.9 % IV SOLN
200.0000 mg | Freq: Once | INTRAVENOUS | Status: AC
  Administered 2021-04-11: 200 mg via INTRAVENOUS
  Filled 2021-04-11: qty 8

## 2021-04-11 MED ORDER — PALONOSETRON HCL INJECTION 0.25 MG/5ML
0.2500 mg | Freq: Once | INTRAVENOUS | Status: AC
  Administered 2021-04-11: 0.25 mg via INTRAVENOUS
  Filled 2021-04-11: qty 5

## 2021-04-11 MED ORDER — CYANOCOBALAMIN 1000 MCG/ML IJ SOLN
1000.0000 ug | Freq: Once | INTRAMUSCULAR | Status: AC
  Administered 2021-04-11: 1000 ug via INTRAMUSCULAR
  Filled 2021-04-11: qty 1

## 2021-04-11 MED ORDER — SODIUM CHLORIDE 0.9% FLUSH
10.0000 mL | INTRAVENOUS | Status: DC | PRN
  Administered 2021-04-11: 10 mL

## 2021-04-11 MED ORDER — PACLITAXEL CHEMO INJECTION 300 MG/50ML
80.0000 mg/m2 | Freq: Once | INTRAVENOUS | Status: AC
  Administered 2021-04-11: 192 mg via INTRAVENOUS
  Filled 2021-04-11: qty 32

## 2021-04-11 MED ORDER — SODIUM CHLORIDE 0.9% FLUSH
10.0000 mL | Freq: Once | INTRAVENOUS | Status: AC
  Administered 2021-04-11: 10 mL

## 2021-04-11 MED ORDER — SODIUM CHLORIDE 0.9 % IV SOLN
10.0000 mg | Freq: Once | INTRAVENOUS | Status: AC
  Administered 2021-04-11: 10 mg via INTRAVENOUS
  Filled 2021-04-11: qty 10

## 2021-04-11 MED ORDER — HEPARIN SOD (PORK) LOCK FLUSH 100 UNIT/ML IV SOLN
500.0000 [IU] | Freq: Once | INTRAVENOUS | Status: AC | PRN
  Administered 2021-04-11: 500 [IU]

## 2021-04-11 MED ORDER — FAMOTIDINE 20 MG IN NS 100 ML IVPB
20.0000 mg | Freq: Once | INTRAVENOUS | Status: AC
  Administered 2021-04-11: 20 mg via INTRAVENOUS
  Filled 2021-04-11: qty 100

## 2021-04-11 NOTE — Assessment & Plan Note (Signed)
10/31/20:Patient palpated a right breast mass x25mo Diagnostic mammogram and UKoreashowed an abnormality and calcifications in the right breast, 8.2cm, with skin thickening and right axillary adenopathy. 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  Treatment plan: 1. Neoadjuvant chemotherapy with Adriamycin and CytoxanKeytruda4 followed by Taxol weekly 12 with carboplatinand Keytrudaevery 3 weeks x429flowed by 1 year of torted Keytruda maintenance 2. Followed bymastectomy and axillary lymph node dissection 3. Followed by adjuvant radiation therapy UR CC nausea study CT CAP 11/14/2020: Enlarged right axillary lymph nodes, prominent left axillary lymph node. (Requires an ultrasound) no evidence of metastatic disease in the abdomen or pelvis Bone scan 11/16/2020: No evidence of bone metastases --------------------------------------------------------------------------------------------------------------------------------------------------------- Current treatment:Completed 4 cycles ofAdriamycin Cytoxan and Keytruda, today cycle7Taxol(will receive every 3 weekcarboplatin and Keytrudatoday) Echocardiogram5/03/2021: EF70-75%  Toxicities: 1. Maculopapular rash:Resolved withprednisone 20 mg. 2.Severe fatigue 3.Tachycardia: On metoprolol XL 4.Emotional distress: 5.Gastritis: On omeprazole 6.Thrush: I sent a prescription for Diflucan.  Menopausal symptoms: I discussed with her to watch it and if it gets much worse we can consider giving Effexor. Return to clinic weekly for Taxol and every other week for follow-up with me.

## 2021-04-11 NOTE — Patient Instructions (Signed)
[facility]  Discharge Instructions: Thank you for choosing [facility] to provide your oncology and hematology care.   If you have a lab appointment with the [facility], please go directly to the [facility] and check in at the registration area.   Wear comfortable clothing and clothing appropriate for easy access to any Portacath or PICC line.   We strive to give you quality time with your provider. You may need to reschedule your appointment if you arrive late (15 or more minutes).  Arriving late affects you and other patients whose appointments are after yours.  Also, if you miss three or more appointments without notifying the office, you may be dismissed from the clinic at the provider's discretion.      For prescription refill requests, have your pharmacy contact our office and allow 72 hours for refills to be completed.    Today you received the following chemotherapy and/or immunotherapy agents: Keytruda,     To help prevent nausea and vomiting after your treatment, we encourage you to take your nausea medication as directed.  BELOW ARE SYMPTOMS THAT SHOULD BE REPORTED IMMEDIATELY: *FEVER GREATER THAN 100.4 F (38 C) OR HIGHER *CHILLS OR SWEATING *NAUSEA AND VOMITING THAT IS NOT CONTROLLED WITH YOUR NAUSEA MEDICATION *UNUSUAL SHORTNESS OF BREATH *UNUSUAL BRUISING OR BLEEDING *URINARY PROBLEMS (pain or burning when urinating, or frequent urination) *BOWEL PROBLEMS (unusual diarrhea, constipation, pain near the anus) TENDERNESS IN MOUTH AND THROAT WITH OR WITHOUT PRESENCE OF ULCERS (sore throat, sores in mouth, or a toothache) UNUSUAL RASH, SWELLING OR PAIN  UNUSUAL VAGINAL DISCHARGE OR ITCHING   Items with * indicate a potential emergency and should be followed up as soon as possible or go to the Emergency Department if any problems should occur.  Please show the CHEMOTHERAPY ALERT CARD or IMMUNOTHERAPY ALERT CARD at check-in to  the Emergency Department and triage nurse.  Should you have questions after your visit or need to cancel or reschedule your appointment, please contact [facility]  Dept: [phone]  and follow the prompts.  Office hours are 8:00 a.m. to 4:30 p.m. Monday - Friday. Please note that voicemails left after 4:00 p.m. may not be returned until the following business day.  We are closed weekends and major holidays. You have access to a nurse at all times for urgent questions. Please call the main number to the clinic Dept: [phone] and follow the prompts.   For any non-urgent questions, you may also contact your provider using MyChart. We now offer e-Visits for anyone 63 and older to request care online for non-urgent symptoms. For details visit [email or web address].   Also download the MyChart app! Go to the app store, search "MyChart", open the app, select [facility], and log in with your MyChart username and password.  Due to Covid, a mask is required upon entering the hospital/clinic. If you do not have a mask, one will be given to you upon arrival. For doctor visits, patients may have 1 support person aged 64 or older with them. For treatment visits, patients cannot have anyone with them due to current Covid guidelines and our immunocompromised population.   Vitamin B12 Injection What is this medication? Vitamin B12 (VAHY tuh min B12) prevents and treats low vitamin B12 levels in your body. It is used in people who do not get enough vitamin B12 from their diet or when their digestive tract does not absorb enough. Vitamin B12 plays  an important role in maintaining the health of your nervous system and red blood cells. This medicine may be used for other purposes; ask your health care provider or pharmacist if you have questions. COMMON BRAND NAME(S): B-12 Compliance Kit, B-12 Injection Kit, Cyomin, Dodex, LA-12, Nutri-Twelve, Physicians EZ Use B-12, Primabalt What  should I tell my care team before I take this medication? They need to know if you have any of these conditions: Kidney disease Leber's disease Megaloblastic anemia An unusual or allergic reaction to cyanocobalamin, cobalt, other medications, foods, dyes, or preservatives Pregnant or trying to get pregnant Breast-feeding How should I use this medication? This medication is injected into a muscle or deeply under the skin. It is usually given in a clinic or care team's office. However, your care team may teach you how to inject yourself. Follow all instructions. Talk to your care team about the use of this medication in children. Special care may be needed. Overdosage: If you think you have taken too much of this medicine contact a poison control center or emergency room at once. NOTE: This medicine is only for you. Do not share this medicine with others. What if I miss a dose? If you are given your dose at a clinic or care team's office, call to reschedule your appointment. If you give your own injections, and you miss a dose, take it as soon as you can. If it is almost time for your next dose, take only that dose. Do not take double or extra doses. What may interact with this medication? Colchicine Heavy alcohol intake This list may not describe all possible interactions. Give your health care provider a list of all the medicines, herbs, non-prescription drugs, or dietary supplements you use. Also tell them if you smoke, drink alcohol, or use illegal drugs. Some items may interact with your medicine. What should I watch for while using this medication? Visit your care team regularly. You may need blood work done while you are taking this medication. You may need to follow a special diet. Talk to your care team. Limit your alcohol intake and avoid smoking to get the best benefit. What side effects may I notice from receiving this medication? Side effects that you should report to your care team  as soon as possible: Allergic reactions-skin rash, itching, hives, swelling of the face, lips, tongue, or throat Swelling of the ankles, hands, or feet Trouble breathing Side effects that usually do not require medical attention (report to your care team if they continue or are bothersome): Diarrhea This list may not describe all possible side effects. Call your doctor for medical advice about side effects. You may report side effects to FDA at 1-800-FDA-1088. Where should I keep my medication? Keep out of the reach of children. Store at room temperature between 15 and 30 degrees C (59 and 85 degrees F). Protect from light. Throw away any unused medication after the expiration date. NOTE: This sheet is a summary. It may not cover all possible information. If you have questions about this medicine, talk to your doctor, pharmacist, or health care provider.  2022 Elsevier/Gold Standard (2020-08-20 11:47:06)

## 2021-04-12 LAB — T4: T4, Total: 6.5 ug/dL (ref 4.5–12.0)

## 2021-04-13 DIAGNOSIS — G629 Polyneuropathy, unspecified: Secondary | ICD-10-CM

## 2021-04-13 HISTORY — DX: Polyneuropathy, unspecified: G62.9

## 2021-04-15 ENCOUNTER — Other Ambulatory Visit: Payer: Self-pay

## 2021-04-15 ENCOUNTER — Encounter: Payer: Self-pay | Admitting: Emergency Medicine

## 2021-04-15 ENCOUNTER — Ambulatory Visit
Admission: EM | Admit: 2021-04-15 | Discharge: 2021-04-15 | Disposition: A | Discharge status: Home / Self Care | Payer: No Typology Code available for payment source | Attending: Internal Medicine | Admitting: Internal Medicine

## 2021-04-15 DIAGNOSIS — R350 Frequency of micturition: Secondary | ICD-10-CM | POA: Diagnosis not present

## 2021-04-15 DIAGNOSIS — Z113 Encounter for screening for infections with a predominantly sexual mode of transmission: Secondary | ICD-10-CM | POA: Insufficient documentation

## 2021-04-15 DIAGNOSIS — R300 Dysuria: Secondary | ICD-10-CM | POA: Diagnosis present

## 2021-04-15 LAB — POCT URINALYSIS DIP (MANUAL ENTRY)

## 2021-04-15 MED ORDER — NITROFURANTOIN MONOHYD MACRO 100 MG PO CAPS: 100.0000 mg | ORAL_CAPSULE | Freq: Two times a day (BID) | ORAL | 0 refills | Status: DC

## 2021-04-15 NOTE — ED Triage Notes (Signed)
Patient c/o dysuria and urgency x 2 days.  Patient has been taken AZO w/o relief.

## 2021-04-15 NOTE — ED Provider Notes (Addendum)
[facility]    CSN: [id number] Arrival date & time: 04/15/21  1828      History   Chief Complaint Chief Complaint  Patient presents with   Dysuria    HPI Zivah Mayr is a 45 y.o. female.   Patient presents with 2-day history of urinary burning, urinary frequency, urinary urgency.  Denies vaginal discharge, hematuria, pelvic pain, abdominal pain, fever, back pain, irregular vaginal bleeding.  Denies any known exposure to STD but has had unprotected sexual intercourse recently.  Last menstrual cycle is unknown as patient is currently going through menopause.  Has been taking Azo without any improvement in symptoms.   Dysuria  Past Medical History:  Diagnosis Date   breast ca dx'd 10/2020   right   Family history of breast cancer    No pertinent past medical history     Patient Active Problem List   Diagnosis Date Noted   Genetic testing 11/28/2020   Port-A-Cath in place 11/22/2020   Family history of breast cancer    Malignant neoplasm of upper-outer quadrant of right breast in female, estrogen receptor negative ([place]) 11/06/2020   Pregnancy 02/19/2014   Morbid obesity ([place]) 02/19/2014   Dermoid cyst of left ovary 02/19/2014    Past Surgical History:  Procedure Laterality Date   CHOLECYSTECTOMY     PORTACATH PLACEMENT N/A 11/21/2020   Procedure: INSERTION PORT-A-CATH;  Surgeon: Donnie Mesa, MD;  Location: [facility];  Service: General;  Laterality: N/A;    OB History     Gravida  6   Para  4   Term      Preterm      AB  1   Living  4      SAB      IAB  1   Ectopic      Multiple      Live Births               Home Medications    Prior to Admission medications   Medication Sig Start Date End Date Taking? Authorizing Provider  lidocaine-prilocaine (EMLA) cream Apply to affected area once 11/07/20  Yes Nicholas Lose, MD  metoprolol succinate (TOPROL XL) 25 MG 24 hr tablet Take 1 tablet (25 mg total) by mouth  daily. 03/14/21  Yes Nicholas Lose, MD  nitrofurantoin, macrocrystal-monohydrate, (MACROBID) 100 MG capsule Take 1 capsule (100 mg total) by mouth 2 (two) times daily. 04/15/21  Yes Odis Luster, FNP  omeprazole (PRILOSEC) 40 MG capsule Take 1 capsule (40 mg total) by mouth daily. 03/14/21  Yes Nicholas Lose, MD  fluconazole (DIFLUCAN) 100 MG tablet Take 1 tablet (100 mg total) by mouth daily. 03/28/21   Nicholas Lose, MD  OLANZapine (ZYPREXA) 10 MG tablet TAKE 0.5 TABLETS (5 MG TOTAL) BY MOUTH AT BEDTIME. START THE DAY AFTER CHEMO AND TAKE FOR THREE DAYS 02/18/21   Gardenia Phlegm, NP  ondansetron (ZOFRAN) 8 MG tablet Take 1 tablet (8 mg total) by mouth 2 (two) times daily as needed. Start on the third day after carboplatin and AC chemotherapy. 11/07/20   Nicholas Lose, MD  prochlorperazine (COMPAZINE) 10 MG tablet Take 1 tablet (10 mg total) by mouth every 6 (six) hours as needed (Nausea or vomiting). 11/07/20   Nicholas Lose, MD    Family History Family History  Problem Relation Age of Onset   Breast cancer Mother    Diabetes Maternal Uncle    Diabetes Maternal Grandmother    Heart disease  Maternal Grandmother     Social History Social History   Tobacco Use   Smoking status: Never   Smokeless tobacco: Never  Vaping Use   Vaping Use: Never used  Substance Use Topics   Alcohol use: Yes    Comment: rare   Drug use: No     Allergies   Paclitaxel and Penicillins   Review of Systems Review of Systems Per HPI  Physical Exam Triage Vital Signs ED Triage Vitals  Enc Vitals Group     BP 04/15/21 1850 (!) 135/93     Pulse Rate 04/15/21 1850 (!) 120     Resp 04/15/21 1850 18     Temp 04/15/21 1850 98.6 F (37 C)     Temp Source 04/15/21 1850 Oral     SpO2 04/15/21 1850 97 %     Weight 04/15/21 1851 270 lb (122.5 kg)     Height 04/15/21 1851 5\' 4"  (1.626 m)     Head Circumference --      Peak Flow --      Pain Score 04/15/21 1851 10     Pain Loc --      Pain Edu? --       Excl. in [place]? --    No data found.  Updated Vital Signs BP (!) 135/93 (BP Location: Left Arm)   Pulse (!) 125   Temp 98.6 F (37 C) (Oral)   Resp 18   Ht 5\' 4"  (1.626 m)   Wt 270 lb (122.5 kg)   SpO2 97%   BMI 46.35 kg/m   Visual Acuity Right Eye Distance:   Left Eye Distance:   Bilateral Distance:    Right Eye Near:   Left Eye Near:    Bilateral Near:     Physical Exam Constitutional:      General: She is not in acute distress.    Appearance: Normal appearance. She is not toxic-appearing or diaphoretic.  HENT:     Head: Normocephalic and atraumatic.  Eyes:     Extraocular Movements: Extraocular movements intact.     Conjunctiva/sclera: Conjunctivae normal.  Cardiovascular:     Rate and Rhythm: Normal rate and regular rhythm.     Pulses: Normal pulses.     Heart sounds: Normal heart sounds.  Pulmonary:     Effort: Pulmonary effort is normal. No respiratory distress.     Breath sounds: Normal breath sounds. No wheezing.  Abdominal:     General: Bowel sounds are normal. There is no distension.     Palpations: Abdomen is soft.     Tenderness: There is no abdominal tenderness.  Skin:    General: Skin is warm and dry.  Neurological:     General: No focal deficit present.     Mental Status: She is alert and oriented to person, place, and time. Mental status is at baseline.  Psychiatric:        Mood and Affect: Mood normal.        Behavior: Behavior normal.        Thought Content: Thought content normal.        Judgment: Judgment normal.     UC Treatments / Results  Labs (all labs ordered are listed, but only abnormal results are displayed) Labs Reviewed  POCT URINALYSIS DIP (MANUAL ENTRY) - Abnormal; Notable for the following components:      Result Value   Color, UA orange (*)    All other components within normal limits  URINE CULTURE  CERVICOVAGINAL ANCILLARY ONLY    EKG   Radiology No results found.  Procedures Procedures (including  critical care time)  Medications Ordered in UC Medications - No data to display  Initial Impression / Assessment and Plan / UC Course  I have reviewed the triage vital signs and the nursing notes.  Pertinent labs & imaging results that were available during my care of the patient were reviewed by me and considered in my medical decision making (see chart for details).     Urinalysis was unable to be completed due to Azo interfering with dipstick.  Urine culture is pending.  Cervicovaginal swab pending to rule out other etiologies since urinalysis not able to be completed.  Will treat with Macrobid x5 days due to patient having typical urinary tract infection symptoms. Heart rate elevated in urgent care today, patient states that this is normal for her.  Patient denied any associated symptoms.  Discussed strict return precautions. Patient verbalized understanding and is agreeable with plan.  Final Clinical Impressions(s) / UC Diagnoses   Final diagnoses:  Dysuria  Urinary frequency  Screening examination for venereal disease     Discharge Instructions      Urine culture and vaginal swab are pending.  We will call if they are positive.  Since you are having typical urinary tract infection symptoms, you are being treated with Macrobid antibiotic.     ED Prescriptions     Medication Sig Dispense Auth. Provider   nitrofurantoin, macrocrystal-monohydrate, (MACROBID) 100 MG capsule Take 1 capsule (100 mg total) by mouth 2 (two) times daily. 10 capsule Odis Luster, FNP      PDMP not reviewed this encounter.   Odis Luster, FNP 04/15/21 [place], [place], FNP 04/15/21 1911    Odis Luster, FNP 04/15/21 1912

## 2021-04-15 NOTE — Discharge Instructions (Addendum)
Urine culture and vaginal swab are pending.  We will call if they are positive.  Since you are having typical urinary tract infection symptoms, you are being treated with Macrobid antibiotic.

## 2021-04-16 ENCOUNTER — Other Ambulatory Visit: Payer: Self-pay | Admitting: Hematology and Oncology

## 2021-04-16 ENCOUNTER — Telehealth: Payer: Self-pay | Admitting: Hematology and Oncology

## 2021-04-16 MED ORDER — FLUCONAZOLE 100 MG PO TABS: 100.0000 mg | ORAL_TABLET | Freq: Every day | ORAL | 0 refills | Status: DC

## 2021-04-16 NOTE — Telephone Encounter (Signed)
R/s per 10/4 los, pt mother aware, will direct pt to [facility]

## 2021-04-17 ENCOUNTER — Telehealth (HOSPITAL_COMMUNITY): Payer: Self-pay | Admitting: Emergency Medicine

## 2021-04-17 ENCOUNTER — Encounter: Payer: Self-pay | Admitting: Adult Health

## 2021-04-17 LAB — URINE CULTURE: Culture: 20000 — AB

## 2021-04-17 LAB — CERVICOVAGINAL ANCILLARY ONLY
Bacterial Vaginitis (gardnerella): POSITIVE — AB
Candida Glabrata: NEGATIVE
Candida Vaginitis: NEGATIVE
Chlamydia: NEGATIVE
Comment: NEGATIVE
Comment: NEGATIVE
Comment: NEGATIVE
Comment: NEGATIVE
Comment: NEGATIVE
Comment: NORMAL
Neisseria Gonorrhea: NEGATIVE
Trichomonas: NEGATIVE

## 2021-04-17 MED ORDER — METRONIDAZOLE 500 MG PO TABS: 500.0000 mg | ORAL_TABLET | Freq: Two times a day (BID) | ORAL | 0 refills | Status: DC

## 2021-04-17 MED FILL — Dexamethasone Sodium Phosphate Inj 100 MG/10ML: INTRAMUSCULAR | Qty: 1 | Status: AC

## 2021-04-18 ENCOUNTER — Ambulatory Visit: Payer: No Typology Code available for payment source

## 2021-04-18 ENCOUNTER — Other Ambulatory Visit: Payer: Self-pay | Admitting: Hematology and Oncology

## 2021-04-18 ENCOUNTER — Other Ambulatory Visit: Payer: No Typology Code available for payment source

## 2021-04-18 ENCOUNTER — Other Ambulatory Visit: Payer: Self-pay

## 2021-04-18 ENCOUNTER — Inpatient Hospital Stay: Payer: No Typology Code available for payment source

## 2021-04-18 ENCOUNTER — Inpatient Hospital Stay: Payer: No Typology Code available for payment source | Attending: Hematology and Oncology

## 2021-04-18 VITALS — BP 144/99 | HR 115 | Temp 98.7°F | Resp 18 | Ht 64.0 in | Wt 283.5 lb

## 2021-04-18 DIAGNOSIS — Z5111 Encounter for antineoplastic chemotherapy: Secondary | ICD-10-CM | POA: Insufficient documentation

## 2021-04-18 DIAGNOSIS — R000 Tachycardia, unspecified: Secondary | ICD-10-CM | POA: Diagnosis not present

## 2021-04-18 DIAGNOSIS — G620 Drug-induced polyneuropathy: Secondary | ICD-10-CM | POA: Diagnosis not present

## 2021-04-18 DIAGNOSIS — Z171 Estrogen receptor negative status [ER-]: Secondary | ICD-10-CM | POA: Insufficient documentation

## 2021-04-18 DIAGNOSIS — C50411 Malignant neoplasm of upper-outer quadrant of right female breast: Secondary | ICD-10-CM

## 2021-04-18 DIAGNOSIS — Z79899 Other long term (current) drug therapy: Secondary | ICD-10-CM | POA: Diagnosis not present

## 2021-04-18 DIAGNOSIS — Z95828 Presence of other vascular implants and grafts: Secondary | ICD-10-CM

## 2021-04-18 LAB — CBC WITH DIFFERENTIAL (CANCER CENTER ONLY)
Abs Immature Granulocytes: 0.05 10*3/uL (ref 0.00–0.07)
Basophils Absolute: 0 10*3/uL (ref 0.0–0.1)
Basophils Relative: 0 %
Eosinophils Absolute: 0 10*3/uL (ref 0.0–0.5)
Eosinophils Relative: 1 %
HCT: 29.1 % — ABNORMAL LOW (ref 36.0–46.0)
Hemoglobin: 9.7 g/dL — ABNORMAL LOW (ref 12.0–15.0)
Immature Granulocytes: 1 %
Lymphocytes Relative: 29 %
Lymphs Abs: 1.6 10*3/uL (ref 0.7–4.0)
MCH: 30.6 pg (ref 26.0–34.0)
MCHC: 33.3 g/dL (ref 30.0–36.0)
MCV: 91.8 fL (ref 80.0–100.0)
Monocytes Absolute: 0.2 10*3/uL (ref 0.1–1.0)
Monocytes Relative: 4 %
Neutro Abs: 3.6 10*3/uL (ref 1.7–7.7)
Neutrophils Relative %: 65 %
Platelet Count: 278 10*3/uL (ref 150–400)
RBC: 3.17 MIL/uL — ABNORMAL LOW (ref 3.87–5.11)
RDW: 15.7 % — ABNORMAL HIGH (ref 11.5–15.5)
WBC Count: 5.6 10*3/uL (ref 4.0–10.5)
nRBC: 0 % (ref 0.0–0.2)

## 2021-04-18 LAB — CMP (CANCER CENTER ONLY)
ALT: 10 U/L (ref 0–44)
AST: 12 U/L — ABNORMAL LOW (ref 15–41)
Albumin: 3.5 g/dL (ref 3.5–5.0)
Alkaline Phosphatase: 76 U/L (ref 38–126)
Anion gap: 9 (ref 5–15)
BUN: 13 mg/dL (ref 6–20)
CO2: 25 mmol/L (ref 22–32)
Calcium: 9.3 mg/dL (ref 8.9–10.3)
Chloride: 104 mmol/L (ref 98–111)
Creatinine: 0.82 mg/dL (ref 0.44–1.00)
GFR, Estimated: >60 mL/min (ref >60)
Glucose, Bld: 147 mg/dL — ABNORMAL HIGH (ref 70–99)
Potassium: 3.7 mmol/L (ref 3.5–5.1)
Sodium: 138 mmol/L (ref 135–145)
Total Bilirubin: 0.3 mg/dL (ref 0.3–1.2)
Total Protein: 7.8 g/dL (ref 6.5–8.1)

## 2021-04-18 LAB — TSH: TSH: 0.226 u[IU]/mL — ABNORMAL LOW (ref 0.308–3.960)

## 2021-04-18 MED ORDER — SODIUM CHLORIDE 0.9% FLUSH: 10.0000 mL | Freq: Once | INTRAVENOUS | Status: DC

## 2021-04-18 MED ORDER — SODIUM CHLORIDE 0.9 % IV SOLN: Freq: Once | INTRAVENOUS | Status: AC

## 2021-04-18 MED ORDER — SODIUM CHLORIDE 0.9% FLUSH
10.0000 mL | INTRAVENOUS | Status: DC | PRN
  Administered 2021-04-18: 10 mL

## 2021-04-18 MED ORDER — SODIUM CHLORIDE 0.9 % IV SOLN
10.0000 mg | Freq: Once | INTRAVENOUS | Status: AC
  Administered 2021-04-18: 10 mg via INTRAVENOUS
  Filled 2021-04-18: qty 10

## 2021-04-18 MED ORDER — DIPHENHYDRAMINE HCL 50 MG/ML IJ SOLN
25.0000 mg | Freq: Once | INTRAMUSCULAR | Status: AC
  Administered 2021-04-18: 25 mg via INTRAVENOUS
  Filled 2021-04-18: qty 1

## 2021-04-18 MED ORDER — HEPARIN SOD (PORK) LOCK FLUSH 100 UNIT/ML IV SOLN
500.0000 [IU] | Freq: Once | INTRAVENOUS | Status: AC | PRN
  Administered 2021-04-18: 500 [IU]

## 2021-04-18 MED ORDER — SODIUM CHLORIDE 0.9 % IV SOLN
60.0000 mg/m2 | Freq: Once | INTRAVENOUS | Status: AC
  Administered 2021-04-18: 144 mg via INTRAVENOUS
  Filled 2021-04-18: qty 24

## 2021-04-18 MED ORDER — FAMOTIDINE 20 MG IN NS 100 ML IVPB
20.0000 mg | Freq: Once | INTRAVENOUS | Status: AC
  Administered 2021-04-18: 20 mg via INTRAVENOUS
  Filled 2021-04-18: qty 100

## 2021-04-18 NOTE — Progress Notes (Signed)
Profound fatigue: Decrease the dosage of Taxol to 60 mg per metered square

## 2021-04-18 NOTE — Progress Notes (Signed)
Pt reports headache and dizziness today. Was started on Flagyl and Diflucan through the ER this week for BV and dysuria. Dr. Lindi Adie noried. Taxol dose reduced.

## 2021-04-19 LAB — T4: T4, Total: 6 ug/dL (ref 4.5–12.0)

## 2021-04-20 ENCOUNTER — Encounter: Payer: No Typology Code available for payment source | Admitting: Nurse Practitioner

## 2021-04-20 DIAGNOSIS — R399 Unspecified symptoms and signs involving the genitourinary system: Secondary | ICD-10-CM

## 2021-04-20 NOTE — Progress Notes (Signed)
I have spent 5 minutes in review of e-visit questionnaire, review and updating patient chart, medical decision making and response to patient.  ° °Merit Gadsby W Syble Picco, NP ° °  °

## 2021-04-20 NOTE — Progress Notes (Signed)
Based on what you shared with me over the phone I would recommend that you continue taking macrobid and flagyl. Sometimes AZO can cause the urinalysis results to be difficult to assess.  I recommend that you be seen for a face to face visit if you symptoms are not improved after taking macrobid completely.  Please contact your primary care physician practice to be seen. Many offices offer virtual options to be seen via video if you are not comfortable going in person to a medical facility at this time.  NOTE: You will NOT be charged for this eVisit.  If you do not have a PCP, [facility] offers a free physician referral service available at [phone]. Our trained staff has the experience, knowledge and resources to put you in touch with a physician who is right for you.    If you are having a true medical emergency please call 911.   Your e-visit answers were reviewed by a board certified advanced clinical practitioner to complete your personal care plan.  Thank you for using e-Visits.

## 2021-04-22 ENCOUNTER — Other Ambulatory Visit: Payer: Self-pay | Admitting: Adult Health

## 2021-04-22 DIAGNOSIS — C50411 Malignant neoplasm of upper-outer quadrant of right female breast: Secondary | ICD-10-CM

## 2021-04-22 DIAGNOSIS — Z171 Estrogen receptor negative status [ER-]: Secondary | ICD-10-CM

## 2021-04-22 MED ORDER — CEPHALEXIN 500 MG PO CAPS: 500.0000 mg | ORAL_CAPSULE | Freq: Four times a day (QID) | ORAL | 0 refills | Status: DC

## 2021-04-22 NOTE — Progress Notes (Signed)
Sending in keflex for patient UTI that grew strep.  She notes she has tolerated keflex in the past.    Wilber Bihari, NP

## 2021-04-24 MED FILL — Dexamethasone Sodium Phosphate Inj 100 MG/10ML: INTRAMUSCULAR | Qty: 1 | Status: AC

## 2021-04-24 NOTE — Progress Notes (Signed)
Patient Care Team: Hague, Rosalyn Charters, MD as PCP - General (Internal Medicine) Rockwell Germany, RN as Oncology Nurse Navigator Amber Kaufmann, RN as Oncology Nurse Navigator Amber Mesa, MD as Consulting Physician (General Surgery) Amber Lose, MD as Consulting Physician (Hematology and Oncology) Amber Pray, MD as Consulting Physician (Radiation Oncology)  DIAGNOSIS:    ICD-10-CM   1. Malignant neoplasm of upper-outer quadrant of right breast in female, estrogen receptor negative ([place])  C50.411    Z17.1       SUMMARY OF ONCOLOGIC HISTORY: Oncology History  Malignant neoplasm of upper-outer quadrant of right breast in female, estrogen receptor negative ([place])  10/31/2020 Initial Diagnosis   Patient palpated a right breast mass x37mo Diagnostic mammogram and [place] an abnormality and calcifications in the right breast, 8.2cm, with skin thickening and right axillary adenopathy. 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   11/07/2020 Cancer Staging   Staging form: Breast, AJCC 8th Edition - Clinical: Stage IIIC (cT4, cN1, cM0, G3, ER-, PR-, HER2: Equivocal) - Signed by GNicholas Lose MD on 11/07/2020 Histologic grading system: 3 grade system   11/22/2020 -  Chemotherapy   Patient is on Treatment Plan: BREAST PEMBROLIZUMAB + AC Q21D X 4 CYCLES FOLLOWED BY PEMBROLIZUMAB + CARBOPLATIN D1 + PACLITAXEL D1,8,15 Q21D X 4 CYCLES        Genetic Testing   Negative genetic testing. No pathogenic variants identified on the [facility] panel. The report date is 11/28/2020.  The CancerNext-Expanded + RNAinsight gene panel offered by [employer] includes sequencing and rearrangement analysis for the following 77 genes: IP, ALK, APC*, ATM*, AXIN2, BAP1, BARD1, BLM, BMPR1A, BRCA1*, BRCA2*, BRIP1*, CDC73, CDH1*,CDK4, CDKN1B, CDKN2A, CHEK2*, CTNNA1, DICER1, FANCC, FH, FLCN,  GALNT12, KIF1B, LZTR1, MAX, MEN1, MET, MLH1*, MSH2*, MSH3, MSH6*, MUTYH*, NBN, NF1*, NF2, NTHL1, PALB2*, PHOX2B, PMS2*, POT1, PRKAR1A, PTCH1, PTEN*, RAD51C*, RAD51D*,RB1, RECQL, RET, SDHA, SDHAF2, SDHB, SDHC, SDHD, SMAD4, SMARCA4, SMARCB1, SMARCE1, STK11, SUFU, TMEM127, TP53*,TSC1, TSC2, VHL and XRCC2 (sequencing and deletion/duplication); EGFR, EGLN1, HOXB13, KIT, MITF, PDGFRA, POLD1 and POLE (sequencing only); EPCAM and GREM1 (deletion/duplication only).     CHIEF COMPLIANT: Cycle 9 Taxol  INTERVAL HISTORY: EOmnia White a 45y.o. with above-mentioned history of right breast cancer who completed 4 cycles of neoadjuvant chemotherapy with Adriamycin and Cytoxan Keytruda. She presents to the clinic today for a toxicity check and for cycle 9 of Taxol.  ALLERGIES:  is allergic to paclitaxel and penicillins.  MEDICATIONS:  Current Outpatient Medications  Medication Sig Dispense Refill   ADDERALL XR 30 MG 24 hr capsule Take 30 mg by mouth every morning.     cephALEXin (KEFLEX) 500 MG capsule Take 1 capsule (500 mg total) by mouth 4 (four) times daily. 28 capsule 0   fluconazole (DIFLUCAN) 100 MG tablet Take 1 tablet (100 mg total) by mouth daily. 20 tablet 0   lidocaine-prilocaine (EMLA) cream Apply to affected area once 30 g 3   metoprolol succinate (TOPROL XL) 25 MG 24 hr tablet Take 1 tablet (25 mg total) by mouth daily. 30 tablet 3   metroNIDAZOLE (FLAGYL) 500 MG tablet Take 1 tablet (500 mg total) by mouth 2 (two) times daily. 14 tablet 0   OLANZapine (ZYPREXA) 10 MG tablet TAKE 0.5 TABLETS (5 MG TOTAL) BY MOUTH AT BEDTIME. START THE DAY AFTER CHEMO AND TAKE FOR THREE DAYS 45 tablet 1   omeprazole (PRILOSEC)  40 MG capsule Take 1 capsule (40 mg total) by mouth daily. 30 capsule 3   ondansetron (ZOFRAN) 8 MG tablet Take 1 tablet (8 mg total) by mouth 2 (two) times daily as needed. Start on the third day after carboplatin and AC chemotherapy. 30 tablet 1   prochlorperazine (COMPAZINE) 10 MG  tablet Take 1 tablet (10 mg total) by mouth every 6 (six) hours as needed (Nausea or vomiting). 30 tablet 1   No current facility-administered medications for this visit.   Facility-Administered Medications Ordered in Other Visits  Medication Dose Route Frequency Provider Last Rate Last Admin   filgrastim-aafi (NIVESTYM) 480 MCG/0.8ML injection             PHYSICAL EXAMINATION: ECOG PERFORMANCE STATUS: 1 - Symptomatic but completely ambulatory  Vitals:   04/25/21 1431  BP: (!) 141/92  Pulse: (!) 120  Resp: 19  Temp: (!) 97.5 F (36.4 C)  SpO2: 99%   Filed Weights   04/25/21 1431  Weight: 288 lb 6.4 oz (130.8 kg)    LABORATORY DATA:  I have reviewed the data as listed CMP Latest Ref Rng & Units 04/18/2021 04/11/2021 04/04/2021  Glucose 70 - 99 mg/dL 147(H) 154(H) 96  BUN 6 - 20 mg/dL '13 13 10  ' Creatinine 0.44 - 1.00 mg/dL 0.82 0.83 0.79  Sodium 135 - 145 mmol/L 138 139 139  Potassium 3.5 - 5.1 mmol/L 3.7 3.7 4.2  Chloride 98 - 111 mmol/L 104 107 107  CO2 22 - 32 mmol/L 25 21(L) 24  Calcium 8.9 - 10.3 mg/dL 9.3 9.2 9.4  Total Protein 6.5 - 8.1 g/dL 7.8 7.7 7.8  Total Bilirubin 0.3 - 1.2 mg/dL 0.3 0.3 0.2(L)  Alkaline Phos 38 - 126 U/L 76 72 79  AST 15 - 41 U/L 12(L) 10(L) 13(L)  ALT 0 - 44 U/L '10 8 11    ' Lab Results  Component Value Date   WBC 5.6 04/18/2021   HGB 9.7 (L) 04/18/2021   HCT 29.1 (L) 04/18/2021   MCV 91.8 04/18/2021   PLT 278 04/18/2021   NEUTROABS 3.6 04/18/2021    ASSESSMENT & PLAN:  Malignant neoplasm of upper-outer quadrant of right breast in female, estrogen receptor negative ([place]) 10/31/20: Patient palpated a right breast mass x12mo Diagnostic mammogram and [place]owed an abnormality and calcifications in the right breast, 8.2cm, with skin thickening and right axillary adenopathy. 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   Treatment plan: 1.  Neoadjuvant chemotherapy with Adriamycin and Cytoxan Keytruda 4 followed by Taxol weekly 12 with carboplatin and Keytruda every 3 weeks x4 followed by 1 year of torted Keytruda maintenance 2. Followed by mastectomy and axillary lymph node dissection  3. Followed by adjuvant radiation therapy UR CC nausea study CT CAP 11/14/2020: Enlarged right axillary lymph nodes, prominent left axillary lymph node.  (Requires an ultrasound) no evidence of metastatic disease in the abdomen or pelvis Bone scan 11/16/2020: No evidence of bone metastases --------------------------------------------------------------------------------------------------------------------------------------------------------- Current treatment: Completed 4 cycles of Adriamycin Cytoxan and Keytruda, today cycle 9 Taxol (will receive every 3 week carboplatin and Keytruda today) Echocardiogram 11/19/2020: EF 70-75 %   Toxicities: 1. Maculopapular rash: Resolved with prednisone 20 mg. 2. Severe fatigue: I recommended administering B12 injections weekly with each treatment.  Reduce the dosage of Taxol 3.  Tachycardia: On metoprolol XL 4. Emotional distress: Still continues to be a challenge  5.  Peripheral  neuropathy: Suddenly the neuropathy has gotten markedly worse.  She is having difficulty feeling any objects with her fingers today. Therefore we will discontinue further Taxol chemotherapy and move up her scheduled for breast MRI and for evaluation for surgery.    She will need to keep the port so that she can receive adjuvant treatment with Keytruda. Return to clinic after surgery to discuss final pathology report and plan adjuvant treatment.    No orders of the defined types were placed in this encounter.  The patient has a good understanding of the overall plan. she agrees with it. she will call with any problems that may develop before the next visit here.  Total time spent: 30 mins including face to face time and time spent for  planning, charting and coordination of care  Rulon Eisenmenger, MD, MPH 04/25/2021  I, Thana Ates, am acting as scribe for Dr. Nicholas White.  I have reviewed the above documentation for accuracy and completeness, and I agree with the above.

## 2021-04-25 ENCOUNTER — Other Ambulatory Visit: Payer: Self-pay

## 2021-04-25 ENCOUNTER — Inpatient Hospital Stay: Payer: No Typology Code available for payment source

## 2021-04-25 ENCOUNTER — Other Ambulatory Visit: Payer: No Typology Code available for payment source

## 2021-04-25 ENCOUNTER — Ambulatory Visit: Payer: No Typology Code available for payment source | Admitting: Hematology and Oncology

## 2021-04-25 ENCOUNTER — Encounter: Payer: Self-pay | Admitting: *Deleted

## 2021-04-25 ENCOUNTER — Inpatient Hospital Stay (HOSPITAL_BASED_OUTPATIENT_CLINIC_OR_DEPARTMENT_OTHER): Payer: No Typology Code available for payment source | Admitting: Hematology and Oncology

## 2021-04-25 ENCOUNTER — Ambulatory Visit: Payer: No Typology Code available for payment source

## 2021-04-25 DIAGNOSIS — C50411 Malignant neoplasm of upper-outer quadrant of right female breast: Secondary | ICD-10-CM

## 2021-04-25 DIAGNOSIS — Z171 Estrogen receptor negative status [ER-]: Secondary | ICD-10-CM

## 2021-04-25 DIAGNOSIS — Z95828 Presence of other vascular implants and grafts: Secondary | ICD-10-CM

## 2021-04-25 DIAGNOSIS — Z5111 Encounter for antineoplastic chemotherapy: Secondary | ICD-10-CM | POA: Diagnosis not present

## 2021-04-25 LAB — CBC WITH DIFFERENTIAL/PLATELET
Abs Immature Granulocytes: 0.01 10*3/uL (ref 0.00–0.07)
Basophils Absolute: 0 10*3/uL (ref 0.0–0.1)
Basophils Relative: 1 %
Eosinophils Absolute: 0 10*3/uL (ref 0.0–0.5)
Eosinophils Relative: 1 %
HCT: 28.2 % — ABNORMAL LOW (ref 36.0–46.0)
Hemoglobin: 9.3 g/dL — ABNORMAL LOW (ref 12.0–15.0)
Immature Granulocytes: 0 %
Lymphocytes Relative: 55 %
Lymphs Abs: 2.1 10*3/uL (ref 0.7–4.0)
MCH: 31 pg (ref 26.0–34.0)
MCHC: 33 g/dL (ref 30.0–36.0)
MCV: 94 fL (ref 80.0–100.0)
Monocytes Absolute: 0.3 10*3/uL (ref 0.1–1.0)
Monocytes Relative: 6 %
Neutro Abs: 1.5 10*3/uL — ABNORMAL LOW (ref 1.7–7.7)
Neutrophils Relative %: 37 %
Platelets: 293 10*3/uL (ref 150–400)
RBC: 3 MIL/uL — ABNORMAL LOW (ref 3.87–5.11)
RDW: 17.1 % — ABNORMAL HIGH (ref 11.5–15.5)
WBC: 3.9 10*3/uL — ABNORMAL LOW (ref 4.0–10.5)
nRBC: 0 % (ref 0.0–0.2)

## 2021-04-25 LAB — TSH: TSH: 0.289 u[IU]/mL — ABNORMAL LOW (ref 0.308–3.960)

## 2021-04-25 MED ORDER — SODIUM CHLORIDE 0.9% FLUSH
10.0000 mL | Freq: Once | INTRAVENOUS | Status: AC
  Administered 2021-04-25: 10 mL

## 2021-04-25 MED ORDER — GABAPENTIN 100 MG PO CAPS: 300.0000 mg | ORAL_CAPSULE | Freq: Every day | ORAL | 1 refills | Status: DC

## 2021-04-25 NOTE — Assessment & Plan Note (Signed)
10/31/20:Patient palpated a right breast mass x89mo Diagnostic mammogram and UKoreashowed an abnormality and calcifications in the right breast, 8.2cm, with skin thickening and right axillary adenopathy. 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  Treatment plan: 1. Neoadjuvant chemotherapy with Adriamycin and CytoxanKeytruda4 followed by Taxol weekly 12 with carboplatinand Keytrudaevery 3 weeks x469flowed by 1 year of torted Keytruda maintenance 2. Followed bymastectomy and axillary lymph node dissection 3. Followed by adjuvant radiation therapy UR CC nausea study CT CAP 11/14/2020: Enlarged right axillary lymph nodes, prominent left axillary lymph node. (Requires an ultrasound) no evidence of metastatic disease in the abdomen or pelvis Bone scan 11/16/2020: No evidence of bone metastases --------------------------------------------------------------------------------------------------------------------------------------------------------- Current treatment:Completed 4 cycles ofAdriamycin Cytoxan and Keytruda, today cycle9Taxol(will receive every 3 weekcarboplatin and Keytrudatoday) Echocardiogram5/03/2021: EF70-75%  Toxicities: 1. Maculopapular rash:Resolved withprednisone 20 mg. 2.Severe fatigue: I recommended administering B12 injections weekly with each treatment.  Reduce the dosage of Taxol 3.Tachycardia:Onmetoprolol XL 4.Emotional distress: Still continues to be a challenge    Menopausal symptoms: I discussed with her to watch it and if it gets much worse we can consider giving Effexor. Return to clinic weekly for Taxol and every other week for follow-up with me.

## 2021-04-26 ENCOUNTER — Ambulatory Visit: Payer: No Typology Code available for payment source

## 2021-04-26 ENCOUNTER — Other Ambulatory Visit: Payer: Self-pay | Admitting: *Deleted

## 2021-04-26 ENCOUNTER — Telehealth: Payer: Self-pay | Admitting: *Deleted

## 2021-04-26 LAB — CMP (CANCER CENTER ONLY)
ALT: 16 U/L (ref 0–44)
AST: 18 U/L (ref 15–41)
Albumin: 3.4 g/dL — ABNORMAL LOW (ref 3.5–5.0)
Alkaline Phosphatase: 62 U/L (ref 38–126)
Anion gap: 13 (ref 5–15)
BUN: 10 mg/dL (ref 6–20)
CO2: 20 mmol/L — ABNORMAL LOW (ref 22–32)
Calcium: 8.4 mg/dL — ABNORMAL LOW (ref 8.9–10.3)
Chloride: 105 mmol/L (ref 98–111)
Creatinine: 0.8 mg/dL (ref 0.44–1.00)
GFR, Estimated: >60 mL/min (ref >60)
Glucose, Bld: 122 mg/dL — ABNORMAL HIGH (ref 70–99)
Potassium: 3.4 mmol/L — ABNORMAL LOW (ref 3.5–5.1)
Sodium: 138 mmol/L (ref 135–145)
Total Bilirubin: 0.2 mg/dL — ABNORMAL LOW (ref 0.3–1.2)
Total Protein: 7.1 g/dL (ref 6.5–8.1)

## 2021-04-26 LAB — T4: T4, Total: 5.5 ug/dL (ref 4.5–12.0)

## 2021-04-26 MED ORDER — METOPROLOL SUCCINATE ER 25 MG PO TB24: 25.0000 mg | ORAL_TABLET | Freq: Two times a day (BID) | ORAL | 0 refills | Status: DC

## 2021-04-26 NOTE — Telephone Encounter (Signed)
Attempted to call patient to let her know that per [facility] in MRI pre auth she needs to call her insurance company to get approved at GI.  Also trying to reach her regarding sooner MRI appt. There is no answer and no voicemail.    Left message with Injection nurse with above information to be given to her when she comes in today for her injection.

## 2021-04-26 NOTE — Telephone Encounter (Signed)
Received call from pt with complaint of racing heartbeat.  Yesterday during office visit pt HR 120 and pt states today her HR feels much higher.  Pt does not have a BP machine or pulse ox at home to adequately check VS.  Per MD pt needing to take additional dose of Metoprolol 25 mg p.o now and repeat again tonight before bed.  MD instruct pt to start taking Metoprolol 25 mg p.o BID.  Updated prescription sent to pharmacy on file.  Pt instructed to purchase blood pressure machine and monitor VS over the weekend.  Pt also educated to go to the emergency room if symptoms become worse over the weekend.  Pt verbalized understanding.

## 2021-04-27 ENCOUNTER — Ambulatory Visit: Payer: No Typology Code available for payment source

## 2021-04-29 ENCOUNTER — Encounter: Payer: Self-pay | Admitting: Adult Health

## 2021-04-29 ENCOUNTER — Other Ambulatory Visit: Payer: Self-pay

## 2021-04-29 ENCOUNTER — Inpatient Hospital Stay: Payer: No Typology Code available for payment source

## 2021-04-29 ENCOUNTER — Inpatient Hospital Stay (HOSPITAL_BASED_OUTPATIENT_CLINIC_OR_DEPARTMENT_OTHER): Payer: No Typology Code available for payment source | Admitting: Hematology and Oncology

## 2021-04-29 ENCOUNTER — Telehealth: Payer: Self-pay | Admitting: *Deleted

## 2021-04-29 ENCOUNTER — Ambulatory Visit: Payer: No Typology Code available for payment source

## 2021-04-29 DIAGNOSIS — Z171 Estrogen receptor negative status [ER-]: Secondary | ICD-10-CM

## 2021-04-29 DIAGNOSIS — Z5111 Encounter for antineoplastic chemotherapy: Secondary | ICD-10-CM | POA: Diagnosis not present

## 2021-04-29 DIAGNOSIS — C50411 Malignant neoplasm of upper-outer quadrant of right female breast: Secondary | ICD-10-CM | POA: Diagnosis not present

## 2021-04-29 MED ORDER — METOPROLOL SUCCINATE ER 50 MG PO TB24: 50.0000 mg | ORAL_TABLET | Freq: Two times a day (BID) | ORAL | 1 refills | Status: DC

## 2021-04-29 NOTE — Telephone Encounter (Signed)
Spoke with patient and gave her information from our precert department and also gave her appt for Dr. Ninfa Linden 10/28 and informed her to call GI back to schedule her MRI sooner.  They have been trying to reach her. Patient verbalized understanding.

## 2021-04-29 NOTE — Progress Notes (Signed)
Patient Care Team: [name], MD as PCP - General (Internal Medicine) Rockwell Germany, RN as Oncology Nurse Navigator Mauro Kaufmann, RN as Oncology Nurse Navigator Donnie Mesa, MD as Consulting Physician (General Surgery) Nicholas Lose, MD as Consulting Physician (Hematology and Oncology) Gery Pray, MD as Consulting Physician (Radiation Oncology)  DIAGNOSIS:  Encounter Diagnosis  Name Primary?   Malignant neoplasm of upper-outer quadrant of right breast in female, estrogen receptor negative ([place])     SUMMARY OF ONCOLOGIC HISTORY: Oncology History  Malignant neoplasm of upper-outer quadrant of right breast in female, estrogen receptor negative ([place])  10/31/2020 Initial Diagnosis   Patient palpated a right breast mass x74mo Diagnostic mammogram and UKore[place]owed an abnormality and calcifications in the right breast, 8.2cm, with skin thickening and right axillary adenopathy. 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   11/07/2020 Cancer Staging   Staging form: Breast, AJCC 8th Edition - Clinical: Stage IIIC (cT4, cN1, cM0, G3, ER-, PR-, HER2: Equivocal) - Signed by GNicholas Lose MD on 11/07/2020 Histologic grading system: 3 grade system   11/22/2020 -  Chemotherapy   Patient is on Treatment Plan: BREAST PEMBROLIZUMAB + AC Q21D X 4 CYCLES FOLLOWED BY PEMBROLIZUMAB + CARBOPLATIN D1 + PACLITAXEL D1,8,15 Q21D X 4 CYCLES        Genetic Testing   Negative genetic testing. No pathogenic variants identified on the [facility] panel. The report date is 11/28/2020.  The CancerNext-Expanded + RNAinsight gene panel offered by [employer] includes sequencing and rearrangement analysis for the following 77 genes: IP, ALK, APC*, ATM*, AXIN2, BAP1, BARD1, BLM, BMPR1A, BRCA1*, BRCA2*, BRIP1*, CDC73, CDH1*,CDK4, CDKN1B, CDKN2A, CHEK2*, CTNNA1, DICER1, FANCC, FH, FLCN,  GALNT12, KIF1B, LZTR1, MAX, MEN1, MET, MLH1*, MSH2*, MSH3, MSH6*, MUTYH*, NBN, NF1*, NF2, NTHL1, PALB2*, PHOX2B, PMS2*, POT1, PRKAR1A, PTCH1, PTEN*, RAD51C*, RAD51D*,RB1, RECQL, RET, SDHA, SDHAF2, SDHB, SDHC, SDHD, SMAD4, SMARCA4, SMARCB1, SMARCE1, STK11, SUFU, TMEM127, TP53*,TSC1, TSC2, VHL and XRCC2 (sequencing and deletion/duplication); EGFR, EGLN1, HOXB13, KIT, MITF, PDGFRA, POLD1 and POLE (sequencing only); EPCAM and GREM1 (deletion/duplication only).     CHIEF COMPLIANT: Follow-up of sinus tachycardia  INTERVAL HISTORY: Amber Melleris a 45year old with above-mentioned history of breast cancer who received neoadjuvant chemotherapy which was discontinued early because of worsening peripheral neuropathy.  She had tachycardia going on for a while and restarted on her metoprolol 25 mg daily.  She called [place] saying that her heart rate is very high and that she needs to be evaluated.  Her heart rate goes up significantly especially when she exerts herself and it has come down slightly since that time.  She denies any dizziness or lightheadedness.   ALLERGIES:  is allergic to paclitaxel and penicillins.  MEDICATIONS:  Current Outpatient Medications  Medication Sig Dispense Refill   ADDERALL XR 30 MG 24 hr capsule Take 30 mg by mouth every morning.     fluconazole (DIFLUCAN) 100 MG tablet Take 1 tablet (100 mg total) by mouth daily. 20 tablet 0   gabapentin (NEURONTIN) 100 MG capsule Take 3 capsules (300 mg total) by mouth at bedtime. 90 capsule 1   metoprolol succinate (TOPROL XL) 50 MG 24 hr tablet Take 1 tablet (50 mg total) by mouth in the morning and at bedtime. 30 tablet 1   metroNIDAZOLE (FLAGYL) 500 MG tablet Take 1 tablet (500 mg total) by mouth 2 (two) times daily. 14 tablet 0  OLANZapine (ZYPREXA) 10 MG tablet TAKE 0.5 TABLETS (5 MG TOTAL) BY MOUTH AT BEDTIME. START THE DAY AFTER CHEMO AND TAKE FOR THREE DAYS 45 tablet 1   omeprazole (PRILOSEC) 40 MG capsule Take 1 capsule (40 mg  total) by mouth daily. 30 capsule 3   No current facility-administered medications for this visit.   Facility-Administered Medications Ordered in Other Visits  Medication Dose Route Frequency Provider Last Rate Last Admin   filgrastim-aafi (NIVESTYM) 480 MCG/0.8ML injection             PHYSICAL EXAMINATION: ECOG PERFORMANCE STATUS: 2 - Symptomatic, <50% confined to bed  Vitals:   04/29/21 1437  BP: 130/89  Pulse: (!) 108  Resp: 16  Temp: (!) 97.5 F (36.4 C)  SpO2: 100%   Filed Weights   04/29/21 1437  Weight: 287 lb (130.2 kg)      LABORATORY DATA:  I have reviewed the data as listed CMP Latest Ref Rng & Units 04/25/2021 04/18/2021 04/11/2021  Glucose 70 - 99 mg/dL 122(H) 147(H) 154(H)  BUN 6 - 20 mg/dL _0 Creatinine 0.44 - 1.00 mg/dL 0.80 0.82 0.83  Sodium 135 - 145 mmol/L 138 138 139  Potassium 3.5 - 5.1 mmol/L 3.4(L) 3.7 3.7  Chloride 98 - 111 mmol/L 105 104 107  CO2 22 - 32 mmol/L 20(L) 25 21(L)  Calcium 8.9 - 10.3 mg/dL 8.4(L) 9.3 9.2  Total Protein 6.5 - 8.1 g/dL 7.1 7.8 7.7  Total Bilirubin 0.3 - 1.2 mg/dL 0.2(L) 0.3 0.3  Alkaline Phos 38 - 126 U/L 62 76 72  AST 15 - 41 U/L 18 12(L) 10(L)  ALT 0 - 44 U/L _1 Lab Results  Component Value Date   WBC 3.9 (L) 04/25/2021   HGB 9.3 (L) 04/25/2021   HCT 28.2 (L) 04/25/2021   MCV 94.0 04/25/2021   PLT 293 04/25/2021   NEUTROABS 1.5 (L) 04/25/2021    ASSESSMENT & PLAN:  Malignant neoplasm of upper-outer quadrant of right breast in female, estrogen receptor negative ([place]) 10/31/20: Patient palpated a right breast mass x5mo Diagnostic mammogram and UKoreashowed an abnormality and calcifications in the right breast, 8.2cm, with skin thickening and right axillary adenopathy. 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   Treatment plan: 1. Neoadjuvant chemotherapy with Adriamycin and Cytoxan Keytruda 4  followed by Taxol weekly 12 with carboplatin and Keytruda every 3 weeks x4 followed by 1 year of torted Keytruda maintenance 2. Followed by mastectomy and axillary lymph node dissection  3. Followed by adjuvant radiation therapy UR CC nausea study CT CAP 11/14/2020: Enlarged right axillary lymph nodes, prominent left axillary lymph node.  (Requires an ultrasound) no evidence of metastatic disease in the abdomen or pelvis Bone scan 11/16/2020: No evidence of bone metastases --------------------------------------------------------------------------------------------------------------------------------------------------------- Current treatment: Completed 4 cycles of Adriamycin Cytoxan and Keytruda, today cycle 9 Taxol (will receive every 3 week carboplatin and Keytruda today) Echocardiogram 11/19/2020: EF 70-75 %   Toxicities: 1. Maculopapular rash: Resolved with prednisone 20 mg. 2. Severe fatigue: I recommended administering B12 injections weekly with each treatment.  Reduce the dosage of Taxol 3.  Tachycardia: On metoprolol XL 4. Emotional distress: Still continues to be a challenge  5.  Peripheral neuropathy: Discontinued Taxol  she will need to keep the port so that she can receive adjuvant treatment with Keytruda.   Sinus tachycardia: I encouraged her  to take metoprolol XL 50 mg once a day.  If her symptoms continue to persist then we will increase to 100 mg once a day.  I instructed her to call us next week with report on her heart rate.  EKG performed today showed sinus tachycardia with a heart rate of 103.  Part of the problem is also anemia.  Return to clinic after surgery to discuss pathology report.    No orders of the defined types were placed in this encounter.  The patient has a good understanding of the overall plan. she agrees with it. she will call with any problems that may develop before the next visit here. Total time spent: 30 mins including face to face time and time spent  for planning, charting and co-ordination of care   Harriette Ohara, MD 04/29/21

## 2021-04-29 NOTE — Assessment & Plan Note (Addendum)
10/31/20:Patient palpated a right breast mass x33mo Diagnostic mammogram and UKoreashowed an abnormality and calcifications in the right breast, 8.2cm, with skin thickening and right axillary adenopathy. 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  Treatment plan: 1. Neoadjuvant chemotherapy with Adriamycin and CytoxanKeytruda4 followed by Taxol weekly 12 with carboplatinand Keytrudaevery 3 weeks x440flowed by 1 year of torted Keytruda maintenance 2. Followed bymastectomy and axillary lymph node dissection 3. Followed by adjuvant radiation therapy UR CC nausea study CT CAP 11/14/2020: Enlarged right axillary lymph nodes, prominent left axillary lymph node. (Requires an ultrasound) no evidence of metastatic disease in the abdomen or pelvis Bone scan 11/16/2020: No evidence of bone metastases --------------------------------------------------------------------------------------------------------------------------------------------------------- Current treatment:Completed 4 cycles ofAdriamycin Cytoxan and Keytruda, today cycle9Taxol(will receive every 3 weekcarboplatin and Keytrudatoday) Echocardiogram5/03/2021: EF70-75%  Toxicities: 1. Maculopapular rash:Resolved withprednisone 20 mg. 2.Severe fatigue: I recommended administering B12 injections weekly with each treatment.  Reduce the dosage of Taxol 3.Tachycardia:Onmetoprolol XL 4.Emotional distress:Still continues to be a challenge 5.  Peripheral neuropathy: Discontinued Taxol  she will need to keep the port so that she can receive adjuvant treatment with Keytruda.   Sinus tachycardia: I encouraged her to take metoprolol XL 50 mg once a day.  If her symptoms continue to persist then we will increase to 100 mg once a day.  I instructed her to call us[place]ext week with report on her heart rate.  EKG performed today showed sinus  tachycardia with a heart rate of 103.  Part of the problem is also anemia.  Return to clinic after surgery to discuss pathology report.

## 2021-04-30 ENCOUNTER — Ambulatory Visit: Payer: No Typology Code available for payment source

## 2021-05-01 ENCOUNTER — Telehealth: Payer: Self-pay | Admitting: *Deleted

## 2021-05-01 ENCOUNTER — Encounter: Payer: Self-pay | Admitting: *Deleted

## 2021-05-01 NOTE — Telephone Encounter (Signed)
Attempted to call patient regarding getting her MRI appt moved up. There was no answer and no voicemail to leave a message.

## 2021-05-06 ENCOUNTER — Encounter: Payer: Self-pay | Admitting: *Deleted

## 2021-05-11 ENCOUNTER — Other Ambulatory Visit: Payer: Self-pay | Admitting: Hematology and Oncology

## 2021-05-11 DIAGNOSIS — C50411 Malignant neoplasm of upper-outer quadrant of right female breast: Secondary | ICD-10-CM

## 2021-05-11 DIAGNOSIS — Z171 Estrogen receptor negative status [ER-]: Secondary | ICD-10-CM

## 2021-05-13 ENCOUNTER — Encounter: Payer: Self-pay | Admitting: Hematology and Oncology

## 2021-05-14 DIAGNOSIS — I429 Cardiomyopathy, unspecified: Secondary | ICD-10-CM

## 2021-05-14 DIAGNOSIS — E2740 Unspecified adrenocortical insufficiency: Secondary | ICD-10-CM

## 2021-05-14 HISTORY — DX: Cardiomyopathy, unspecified: I42.9

## 2021-05-14 HISTORY — DX: Unspecified adrenocortical insufficiency: E27.40

## 2021-05-17 ENCOUNTER — Ambulatory Visit
Admission: RE | Admit: 2021-05-17 | Discharge: 2021-05-17 | Disposition: A | Discharge status: Home / Self Care | Payer: No Typology Code available for payment source | Source: Ambulatory Visit | Attending: Hematology and Oncology | Admitting: Hematology and Oncology

## 2021-05-17 DIAGNOSIS — C50411 Malignant neoplasm of upper-outer quadrant of right female breast: Secondary | ICD-10-CM

## 2021-05-17 DIAGNOSIS — Z171 Estrogen receptor negative status [ER-]: Secondary | ICD-10-CM

## 2021-05-17 MED ORDER — GADOBUTROL 1 MMOL/ML IV SOLN
10.0000 mL | Freq: Once | INTRAVENOUS | Status: AC | PRN
  Administered 2021-05-17: 10 mL via INTRAVENOUS

## 2021-05-20 ENCOUNTER — Ambulatory Visit: Payer: Self-pay | Admitting: Surgery

## 2021-05-20 ENCOUNTER — Encounter: Payer: Self-pay | Admitting: *Deleted

## 2021-05-20 NOTE — H&P (Signed)
Subjective    Chief Complaint: Follow-up (Right breast cancer)       History of Present Illness: Amber White is a 45 y.o. female who is seen today as an office consultation at the request of Dr. Bethann Punches for evaluation of Follow-up (Right breast cancer) .     Breast [facility] 11/07/20 Amber White   PCP - Dr. London Pepper ?   This is a 45 year old female with a long history of axillary hidradenitis who presents with 2 months of enlarging firmness in the right upper outer quadrant of her breast with some associated skin thickening.  She initially thought that this was part of her hidradenitis.  She finally sought attention for the breast mass.  Imaging showed an 8.2 cm mass in the right upper outer quadrant with overlying skin thickening.  She had bulky right axillary lymphadenopathy.  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  One of the lymph nodes was also biopsied and revealed metastatic carcinoma.     Her mother had breast cancer about 20 years ago.     We placed a port on 11/21/2020.  She underwent neoadjuvant chemotherapy.  This had to be stopped early because of peripheral neuropathy and cardiac issues.  MRI was performed on 05/17/2021.  This showed near complete response on the right.  Previously the area of enhancement measured 10.2 x 4.9 x 5.0 cm.  Currently there is a single remaining area of enhancement measuring 4.5 mm.  The lymph nodes on the right side have returned to normal size.  Previously there were 7-8 markedly abnormal lymph nodes.     Review of Systems: A complete review of systems was obtained from the patient.  I have reviewed this information and discussed as appropriate with the patient.  See HPI as well for other ROS.   Review of Systems  Constitutional: Negative.   HENT: Negative.   Eyes: Negative.   Respiratory: Negative.   Cardiovascular: Negative.   Gastrointestinal: Negative.    Genitourinary: Negative.   Musculoskeletal: Negative.   Skin: Negative.   Neurological: Positive for tingling and sensory change.  Endo/Heme/Allergies: Negative.   Psychiatric/Behavioral: Negative.         Medical History: Past Medical History      Past Medical History:  Diagnosis Date   History of cancer             Patient Active Problem List  Diagnosis   Malignant neoplasm of upper-outer quadrant of right breast in female, estrogen receptor negative (CMS-HCC)   Morbid obesity (CMS-HCC)   Port-A-Cath in place      Past Surgical History       Past Surgical History:  Procedure Laterality Date   LAPAROSCOPIC CHOLECYSTECTOMY            Allergies       Allergies  Allergen Reactions   Paclitaxel Nausea and Other (See Comments)      Complained of chest burning and nausea. Resolved with famotidine, methylprednisolone, and diphenhydramine. Resumed paclitaxel and completed without further incident.    Penicillins Hives              Current Outpatient Medications on File Prior to Visit  Medication Sig Dispense Refill   dextroamphetamine-amphetamine (ADDERALL XR) 30 MG XR capsule Take 1 capsule (30 mg total) by mouth every morning       metoprolol succinate (TOPROL-XL) 25 MG XL tablet Take 1  tablet (25 mg total) by mouth once daily        No current facility-administered medications on file prior to visit.      Family History       Family History  Problem Relation Age of Onset   Breast cancer Mother          Social History       Tobacco Use  Smoking Status Never  Smokeless Tobacco Never      Social History  Social History         Socioeconomic History   Marital status: Unknown  Tobacco Use   Smoking status: Never   Smokeless tobacco: Never  Substance and Sexual Activity   Alcohol use: Yes      Comment: occasionally   Drug use: Never        Objective:         Vitals:    05/20/21 0909  BP: (!) 140/80  Pulse: (!) 153  Temp: 36.7 C  (98 F)  SpO2: 100%  Weight: (!) 129.8 kg (286 lb 3.2 oz)  Height: 162.6 cm ('5\' 4"' )    Body mass index is 49.13 kg/m.   Physical Exam    Constitutional: WDWN in NAD, conversant, no obvious deformities; resting comfortably Eyes: Pupils equal, round; sclera anicteric; moist conjunctiva; no lid lag HENT: Oral mucosa moist; good dentition Neck: No masses palpated, trachea midline; no thyromegaly Lungs: CTA bilaterally; normal respiratory effort Right chest port site WNL Breasts: left breast shows no palpable masses, no nipple changes or discharge; no axillary lymphadenopathy; axillary and bilateral inframammary hidradenitis Right breast shows resolution of the large firm palpable mass in the upper outer quadrant; no palpable lymph nodes;  no nipple changes or discharge CV: Regular rate and rhythm; no murmurs; extremities well-perfused with no edema Abd: +bowel sounds, obese, soft, non-tender, no palpable organomegaly; no palpable hernias Musc: Normal gait; no apparent clubbing or cyanosis in extremities Lymphatic: No palpable cervical or axillary lymphadenopathy Skin: Warm, dry; no sign of jaundice Psychiatric - alert and oriented x 4; calm mood and affect       Labs, Imaging and Diagnostic Testing: CLINICAL DATA:  Follow-up known breast cancer. Assess treatment response.   LABS:  None   EXAM: BILATERAL BREAST MRI WITH AND WITHOUT CONTRAST   TECHNIQUE: Multiplanar, multisequence MR images of both breasts were obtained prior to and following the intravenous administration of 10 ml of Gadavist   Three-dimensional MR images were rendered by post-processing of the original MR data on an independent workstation. The three-dimensional MR images were interpreted, and findings are reported in the following complete MRI report for this study. Three dimensional images were evaluated at the independent interpreting workstation using the DynaCAD thin client.   COMPARISON:  Nov 11, 2020  breast MRI   FINDINGS: Breast composition: c. Heterogeneous fibroglandular tissue.   Background parenchymal enhancement: Mild   Right breast: The diffuse abnormal masslike and non mass enhancement throughout the right breast measuring 10.2 x 4.9 x 5.0 cm on the previous study has almost completely resolved in the interval. There is a single focus of enhancement remaining measuring 4.5 mm on series 6, image 73. No other abnormal enhancement is identified in the right breast. The enhancement of the nipple and adjacent skin has resolved. The enhancement in the skin of the medial right breast has resolved. Skin thickening has improved but remains.   Left breast: No suspicious masses or non mass enhancement are seen in the left breast.  There is mild skin based enhancement in the inferior left breast seen on series 6, image 126 best seen on DynaCAD. The patient has 5 left intramammary lymph nodes. The left intramammary lymph node on series 6, image 72 is larger in the interval. The intramammary lymph node on series 6, image 88 is larger in the interval. The other intramammary nodes are stable.   Lymph nodes: Left intramammary lymph nodes as above. No axillary adenopathy identified bilaterally. The abnormal nodes on the right have returned to normal size.   Ancillary findings:  None.   IMPRESSION: 1. Near complete response on the right. The 10.2 x 4.9 x 5.0 cm masslike and non masslike enhancement in the right breast on the previous study has almost completely resolved. A single remaining focus of enhancement measuring 4.5 mm remains on series 6, image 73. The finding is not specific but a tiny amount of residual malignancy in this region is not excluded on this study. 2. Mildly enhancing skin thickening in the inferior left breast, likely inflammatory or infectious. Recommend clinical correlation. 3. Two intramammary lymph nodes on the left are larger in the interval. Given the skin  based process, reactive nodes are favored. This could be further evaluated with ultrasound and close follow-up.   RECOMMENDATION: Recommend continued surgical and oncologic follow up. Recommend clinical correlation of the inferior left breast as there appears to be a skin based lesion in this region, likely inflammatory infectious. Short-term follow-up of the left intramammary nodes is recommended to ensure return to normal.   BI-RADS CATEGORY  6: Known biopsy-proven malignancy.     Electronically Signed   By: Dorise Bullion III M.D.   On: 05/17/2021 17:49     Assessment and Plan:  Diagnoses and all orders for this visit:   Invasive ductal carcinoma of breast, female, right (CMS-HCC)   Stage IIIc   Although the patient has had an excellent response to neoadjuvant chemotherapy, her initial presentation showed a large number of positive lymph nodes as well as a very large primary tumor with skin involvement.  We continue to recommend mastectomy with axillary lymph node dissection (modified radical mastectomy).  She will need radiation after surgery.  She may elect to consider delayed reconstruction.  The patient is considering possible prophylactic left mastectomy.  She will let me know within the next couple of days.   We will plan a right modified radical mastectomy.The surgical procedure has been discussed with the patient.  Potential risks, benefits, alternative treatments, and expected outcomes have been explained.  All of the patient's questions at this time have been answered.  The likelihood of reaching the patient's treatment goal is good.  The patient understand the proposed surgical procedure and wishes to proceed.       Carlean Jews, MD  05/20/2021 3:32 PM

## 2021-05-21 ENCOUNTER — Emergency Department (HOSPITAL_COMMUNITY): Payer: No Typology Code available for payment source

## 2021-05-21 ENCOUNTER — Inpatient Hospital Stay (HOSPITAL_COMMUNITY)
Admission: EM | Admit: 2021-05-21 | Discharge: 2021-05-24 | DRG: 644 | Disposition: A | Discharge status: Home / Self Care | Payer: No Typology Code available for payment source | Attending: Internal Medicine | Admitting: Internal Medicine

## 2021-05-21 ENCOUNTER — Encounter (HOSPITAL_COMMUNITY): Payer: Self-pay

## 2021-05-21 ENCOUNTER — Other Ambulatory Visit: Payer: Self-pay | Admitting: Hematology and Oncology

## 2021-05-21 DIAGNOSIS — C50411 Malignant neoplasm of upper-outer quadrant of right female breast: Secondary | ICD-10-CM

## 2021-05-21 DIAGNOSIS — E871 Hypo-osmolality and hyponatremia: Secondary | ICD-10-CM | POA: Diagnosis present

## 2021-05-21 DIAGNOSIS — Z171 Estrogen receptor negative status [ER-]: Secondary | ICD-10-CM

## 2021-05-21 DIAGNOSIS — Z803 Family history of malignant neoplasm of breast: Secondary | ICD-10-CM

## 2021-05-21 DIAGNOSIS — Z20822 Contact with and (suspected) exposure to covid-19: Secondary | ICD-10-CM | POA: Diagnosis present

## 2021-05-21 DIAGNOSIS — R0609 Other forms of dyspnea: Secondary | ICD-10-CM | POA: Diagnosis present

## 2021-05-21 DIAGNOSIS — Z6841 Body Mass Index (BMI) 40.0 and over, adult: Secondary | ICD-10-CM

## 2021-05-21 DIAGNOSIS — Z833 Family history of diabetes mellitus: Secondary | ICD-10-CM

## 2021-05-21 DIAGNOSIS — Z9049 Acquired absence of other specified parts of digestive tract: Secondary | ICD-10-CM

## 2021-05-21 DIAGNOSIS — E876 Hypokalemia: Secondary | ICD-10-CM | POA: Diagnosis present

## 2021-05-21 DIAGNOSIS — Z79899 Other long term (current) drug therapy: Secondary | ICD-10-CM

## 2021-05-21 DIAGNOSIS — I429 Cardiomyopathy, unspecified: Secondary | ICD-10-CM | POA: Diagnosis present

## 2021-05-21 DIAGNOSIS — D6481 Anemia due to antineoplastic chemotherapy: Secondary | ICD-10-CM | POA: Diagnosis present

## 2021-05-21 DIAGNOSIS — R7989 Other specified abnormal findings of blood chemistry: Secondary | ICD-10-CM

## 2021-05-21 DIAGNOSIS — E2740 Unspecified adrenocortical insufficiency: Principal | ICD-10-CM | POA: Diagnosis present

## 2021-05-21 DIAGNOSIS — Z880 Allergy status to penicillin: Secondary | ICD-10-CM

## 2021-05-21 DIAGNOSIS — R0600 Dyspnea, unspecified: Secondary | ICD-10-CM | POA: Diagnosis present

## 2021-05-21 DIAGNOSIS — E441 Mild protein-calorie malnutrition: Secondary | ICD-10-CM | POA: Diagnosis present

## 2021-05-21 DIAGNOSIS — Z888 Allergy status to other drugs, medicaments and biological substances status: Secondary | ICD-10-CM

## 2021-05-21 DIAGNOSIS — G629 Polyneuropathy, unspecified: Secondary | ICD-10-CM | POA: Diagnosis present

## 2021-05-21 DIAGNOSIS — Z8249 Family history of ischemic heart disease and other diseases of the circulatory system: Secondary | ICD-10-CM

## 2021-05-21 DIAGNOSIS — T451X5A Adverse effect of antineoplastic and immunosuppressive drugs, initial encounter: Secondary | ICD-10-CM | POA: Diagnosis present

## 2021-05-21 DIAGNOSIS — C50911 Malignant neoplasm of unspecified site of right female breast: Secondary | ICD-10-CM | POA: Diagnosis present

## 2021-05-21 LAB — CK: Total CK: 122 U/L (ref 38–234)

## 2021-05-21 LAB — CBC WITH DIFFERENTIAL/PLATELET
Abs Immature Granulocytes: 0.02 10*3/uL (ref 0.00–0.07)
Basophils Absolute: 0 10*3/uL (ref 0.0–0.1)
Basophils Relative: 0 %
Eosinophils Absolute: 0.2 10*3/uL (ref 0.0–0.5)
Eosinophils Relative: 3 %
HCT: 31 % — ABNORMAL LOW (ref 36.0–46.0)
Hemoglobin: 9.8 g/dL — ABNORMAL LOW (ref 12.0–15.0)
Immature Granulocytes: 0 %
Lymphocytes Relative: 31 %
Lymphs Abs: 2.2 10*3/uL (ref 0.7–4.0)
MCH: 30.4 pg (ref 26.0–34.0)
MCHC: 31.6 g/dL (ref 30.0–36.0)
MCV: 96.3 fL (ref 80.0–100.0)
Monocytes Absolute: 0.5 10*3/uL (ref 0.1–1.0)
Monocytes Relative: 7 %
Neutro Abs: 4.2 10*3/uL (ref 1.7–7.7)
Neutrophils Relative %: 59 %
Platelets: 417 10*3/uL — ABNORMAL HIGH (ref 150–400)
RBC: 3.22 MIL/uL — ABNORMAL LOW (ref 3.87–5.11)
RDW: 16.2 % — ABNORMAL HIGH (ref 11.5–15.5)
WBC: 7.1 10*3/uL (ref 4.0–10.5)
nRBC: 0 % (ref 0.0–0.2)

## 2021-05-21 LAB — RESP PANEL BY RT-PCR (FLU A&B, COVID) ARPGX2
Influenza A by PCR: NEGATIVE
Influenza B by PCR: NEGATIVE
SARS Coronavirus 2 by RT PCR: NEGATIVE

## 2021-05-21 LAB — COMPREHENSIVE METABOLIC PANEL
ALT: 17 U/L (ref 0–44)
AST: 31 U/L (ref 15–41)
Albumin: 3.4 g/dL — ABNORMAL LOW (ref 3.5–5.0)
Alkaline Phosphatase: 54 U/L (ref 38–126)
Anion gap: 7 (ref 5–15)
BUN: 6 mg/dL (ref 6–20)
CO2: 24 mmol/L (ref 22–32)
Calcium: 8.8 mg/dL — ABNORMAL LOW (ref 8.9–10.3)
Chloride: 102 mmol/L (ref 98–111)
Creatinine, Ser: 0.64 mg/dL (ref 0.44–1.00)
GFR, Estimated: >60 mL/min (ref >60)
Glucose, Bld: 136 mg/dL — ABNORMAL HIGH (ref 70–99)
Potassium: 3.2 mmol/L — ABNORMAL LOW (ref 3.5–5.1)
Sodium: 133 mmol/L — ABNORMAL LOW (ref 135–145)
Total Bilirubin: 0.5 mg/dL (ref 0.3–1.2)
Total Protein: 7.9 g/dL (ref 6.5–8.1)

## 2021-05-21 LAB — PHOSPHORUS: Phosphorus: 4.5 mg/dL (ref 2.5–4.6)

## 2021-05-21 LAB — TROPONIN I (HIGH SENSITIVITY)
Troponin I (High Sensitivity): 8 ng/L (ref <18)
Troponin I (High Sensitivity): 9 ng/L (ref <18)

## 2021-05-21 LAB — LACTIC ACID, PLASMA
Lactic Acid, Venous: 1.3 mmol/L (ref 0.5–1.9)
Lactic Acid, Venous: 1.1 mmol/L (ref 0.5–1.9)

## 2021-05-21 LAB — MAGNESIUM: Magnesium: 1.7 mg/dL (ref 1.7–2.4)

## 2021-05-21 LAB — TSH: TSH: 0.172 u[IU]/mL — ABNORMAL LOW (ref 0.350–4.500)

## 2021-05-21 MED ORDER — MAGNESIUM SULFATE 2 GM/50ML IV SOLN
2.0000 g | Freq: Once | INTRAVENOUS | Status: AC
  Administered 2021-05-21: 2 g via INTRAVENOUS
  Filled 2021-05-21: qty 50

## 2021-05-21 MED ORDER — ONDANSETRON HCL 4 MG PO TABS: 4.0000 mg | ORAL_TABLET | Freq: Four times a day (QID) | ORAL | Status: DC | PRN

## 2021-05-21 MED ORDER — METOPROLOL TARTRATE 5 MG/5ML IV SOLN
5.0000 mg | Freq: Once | INTRAVENOUS | Status: AC
  Administered 2021-05-21: 5 mg via INTRAVENOUS
  Filled 2021-05-21: qty 5

## 2021-05-21 MED ORDER — METOPROLOL SUCCINATE ER 50 MG PO TB24
50.0000 mg | ORAL_TABLET | Freq: Every day | ORAL | Status: DC
  Administered 2021-05-21 – 2021-05-22 (×2): 50 mg via ORAL
  Filled 2021-05-21 (×3): qty 1

## 2021-05-21 MED ORDER — POTASSIUM CHLORIDE CRYS ER 20 MEQ PO TBCR
40.0000 meq | EXTENDED_RELEASE_TABLET | Freq: Once | ORAL | Status: AC
  Administered 2021-05-21: 40 meq via ORAL
  Filled 2021-05-21: qty 2

## 2021-05-21 MED ORDER — POTASSIUM CHLORIDE IN NACL 20-0.9 MEQ/L-% IV SOLN
INTRAVENOUS | Status: DC
  Filled 2021-05-21 (×3): qty 1000

## 2021-05-21 MED ORDER — LACTATED RINGERS IV BOLUS
1000.0000 mL | Freq: Once | INTRAVENOUS | Status: AC
  Administered 2021-05-21: 1000 mL via INTRAVENOUS

## 2021-05-21 MED ORDER — ACETAMINOPHEN 325 MG PO TABS: 650.0000 mg | ORAL_TABLET | Freq: Four times a day (QID) | ORAL | Status: DC | PRN

## 2021-05-21 MED ORDER — ACETAMINOPHEN 650 MG RE SUPP: 650.0000 mg | Freq: Four times a day (QID) | RECTAL | Status: DC | PRN

## 2021-05-21 MED ORDER — ONDANSETRON HCL 4 MG/2ML IJ SOLN: 4.0000 mg | Freq: Four times a day (QID) | INTRAMUSCULAR | Status: DC | PRN

## 2021-05-21 MED ORDER — IOHEXOL 350 MG/ML SOLN
60.0000 mL | Freq: Once | INTRAVENOUS | Status: AC | PRN
  Administered 2021-05-21: 60 mL via INTRAVENOUS

## 2021-05-21 MED ORDER — METOPROLOL SUCCINATE ER 25 MG PO TB24
25.0000 mg | ORAL_TABLET | Freq: Every day | ORAL | Status: DC
  Administered 2021-05-22: 25 mg via ORAL
  Filled 2021-05-21 (×2): qty 1

## 2021-05-21 MED ORDER — METOPROLOL SUCCINATE ER 50 MG PO TB24: 50.0000 mg | ORAL_TABLET | Freq: Every day | ORAL | Status: DC

## 2021-05-21 NOTE — ED Provider Notes (Signed)
[facility] DEPT Provider Note   CSN: [id number] Arrival date & time: 05/21/21  0932     History Chief Complaint  Patient presents with   Shortness of Breath    Amber White is a 45 y.o. female with PMH axillary hidradenitis suppurativa, invasive ductal carcinoma status post chemotherapy completion 3 weeks ago and currently scheduled for mastectomy who presents the emergency department for evaluation of tachycardia and shortness of breath.  Patient states that over the last 3 weeks she has had multiple episodes of persistent tachycardia that her oncologist has been treating with outpatient beta-blockers.  She states that the last 24 hours her shortness of breath has significantly worsened.  She states she is unable to walk short distances without gasping for air.  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  Denies nausea, vomiting, cough, fever or other systemic symptoms.   Shortness of Breath Associated symptoms: no abdominal pain, no chest pain, no cough, no ear pain, no fever, no rash, no sore throat and no vomiting       Past Medical History:  Diagnosis Date   breast ca dx'd 10/2020   right   Family history of breast cancer    No pertinent past medical history     Patient Active Problem List   Diagnosis Date Noted   Genetic testing 11/28/2020   Port-A-Cath in place 11/22/2020   Family history of breast cancer    Malignant neoplasm of upper-outer quadrant of right breast in female, estrogen receptor negative ([place]) 11/06/2020   Pregnancy 02/19/2014   Morbid obesity ([place]) 02/19/2014   Dermoid cyst of left ovary 02/19/2014    Past Surgical History:  Procedure Laterality Date   CHOLECYSTECTOMY     PORTACATH PLACEMENT N/A 11/21/2020   Procedure: INSERTION PORT-A-CATH;  Surgeon: Donnie Mesa, MD;  Location: [facility];  Service: General;  Laterality: N/A;     OB History     Gravida  6   Para   4   Term      Preterm      AB  1   Living  4      SAB      IAB  1   Ectopic      Multiple      Live Births              Family History  Problem Relation Age of Onset   Breast cancer Mother    Diabetes Maternal Uncle    Diabetes Maternal Grandmother    Heart disease Maternal Grandmother     Social History   Tobacco Use   Smoking status: Never   Smokeless tobacco: Never  Vaping Use   Vaping Use: Never used  Substance Use Topics   Alcohol use: Yes    Comment: rare   Drug use: No    Home Medications Prior to Admission medications   Medication Sig Start Date End Date Taking? Authorizing Provider  ADDERALL XR 30 MG 24 hr capsule Take 30 mg by mouth every morning. 03/19/21   [provider]  fluconazole (DIFLUCAN) 100 MG tablet Take 1 tablet (100 mg total) by mouth daily. 04/16/21   Nicholas Lose, MD  gabapentin (NEURONTIN) 100 MG capsule Take 3 capsules (300 mg total) by mouth at bedtime. 04/25/21   Nicholas Lose, MD  metoprolol succinate (TOPROL XL) 50 MG 24 hr tablet Take 1 tablet (50 mg total) by mouth in the morning and at bedtime. 04/29/21  Nicholas Lose, MD  metroNIDAZOLE (FLAGYL) 500 MG tablet Take 1 tablet (500 mg total) by mouth 2 (two) times daily. 04/17/21   Lamptey, Myrene Galas, MD  OLANZapine (ZYPREXA) 10 MG tablet TAKE 0.5 TABLETS (5 MG TOTAL) BY MOUTH AT BEDTIME. START THE DAY AFTER CHEMO AND TAKE FOR THREE DAYS 02/18/21   Gardenia Phlegm, NP  omeprazole (PRILOSEC) 40 MG capsule TAKE 1 CAPSULE (40 MG TOTAL) BY MOUTH DAILY. 05/13/21   Nicholas Lose, MD  prochlorperazine (COMPAZINE) 10 MG tablet Take 1 tablet (10 mg total) by mouth every 6 (six) hours as needed (Nausea or vomiting). 11/07/20 04/25/21  Nicholas Lose, MD    Allergies    Paclitaxel and Penicillins  Review of Systems   Review of Systems  Constitutional:  Negative for chills and fever.  HENT:  Negative for ear pain and sore throat.   Eyes:  Negative for pain and visual  disturbance.  Respiratory:  Positive for chest tightness and shortness of breath. Negative for cough.   Cardiovascular:  Negative for chest pain and palpitations.  Gastrointestinal:  Negative for abdominal pain and vomiting.  Genitourinary:  Negative for dysuria and hematuria.  Musculoskeletal:  Negative for arthralgias and back pain.  Skin:  Negative for color change and rash.  Neurological:  Negative for seizures and syncope.  All other systems reviewed and are negative.  Physical Exam Updated Vital Signs BP (!) 124/102 (BP Location: Right Arm)   Pulse (!) 127   Temp 98.2 F (36.8 C) (Oral)   Resp 20   Ht 5\' 4"  (1.626 m)   Wt 126.6 kg   SpO2 98%   BMI 47.89 kg/m   Physical Exam Vitals and nursing note reviewed.  Constitutional:      General: She is not in acute distress.    Appearance: She is well-developed.  HENT:     Head: Normocephalic and atraumatic.  Eyes:     Conjunctiva/sclera: Conjunctivae normal.  Cardiovascular:     Rate and Rhythm: Regular rhythm. Tachycardia present.     Heart sounds: No murmur heard. Pulmonary:     Effort: Pulmonary effort is normal. No respiratory distress.     Breath sounds: Normal breath sounds.  Abdominal:     Palpations: Abdomen is soft.     Tenderness: There is no abdominal tenderness.  Musculoskeletal:     Cervical back: Neck supple.  Skin:    General: Skin is warm and dry.  Neurological:     Mental Status: She is alert.    ED Results / Procedures / Treatments   Labs (all labs ordered are listed, but only abnormal results are displayed) Labs Reviewed  CBC WITH DIFFERENTIAL/PLATELET - Abnormal; Notable for the following components:      Result Value   RBC 3.22 (*)    Hemoglobin 9.8 (*)    HCT 31.0 (*)    RDW 16.2 (*)    Platelets 417 (*)    All other components within normal limits  COMPREHENSIVE METABOLIC PANEL - Abnormal; Notable for the following components:   Sodium 133 (*)    Potassium 3.2 (*)    Glucose, Bld  136 (*)    Calcium 8.8 (*)    Albumin 3.4 (*)    All other components within normal limits  CULTURE, BLOOD (ROUTINE X 2)  RESP PANEL BY RT-PCR (FLU A&B, COVID) ARPGX2  CULTURE, BLOOD (ROUTINE X 2) W REFLEX TO ID PANEL  LACTIC ACID, PLASMA  LACTIC ACID, PLASMA  CK  TROPONIN  I (HIGH SENSITIVITY)  TROPONIN I (HIGH SENSITIVITY)    EKG EKG Interpretation  Date/Time:  Tuesday May 21 2021 10:03:15 EST Ventricular Rate:  128 PR Interval:  135 QRS Duration: 92 QT Interval:  412 QTC Calculation: 602 R Axis:   30 Text Interpretation: Sinus tachycardia Consider right atrial enlargement Low voltage, precordial leads no sig change from previous Confirmed by Charlesetta Shanks [phone]) on 05/21/2021 10:45:47 AM  Radiology DG Chest 2 View  Result Date: 05/21/2021 CLINICAL DATA:  Shortness of breath. Additional history provided: History of breast cancer, patient reports recently completing chemotherapy. EXAM: CHEST - 2 VIEW COMPARISON:  Chest radiograph 11/21/2020. FINDINGS: A right chest infusion port catheter is present with tip projecting at the level of the lower SVC. Heart size within normal limits. No appreciable airspace consolidation or pulmonary edema. No evidence of pleural effusion or pneumothorax. No acute bony abnormality identified. Mild thoracolumbar dextrocurvature. Surgical clips within the upper abdomen. IMPRESSION: No evidence of acute cardiopulmonary abnormality. Electronically Signed   By: Kellie Simmering D.O.   On: 05/21/2021 10:55    Procedures Procedures   Medications Ordered in ED Medications  lactated ringers bolus 1,000 mL (has no administration in time range)    ED Course  I have reviewed the triage vital signs and the nursing notes.  Pertinent labs & imaging results that were available during my care of the patient were reviewed by me and considered in my medical decision making (see chart for details).    MDM Rules/Calculators/A&P                            Patient seen emergency department for evaluation of chest pain, shortness of breath and tachycardia.  Physical exam reveals a regular tachycardia but is otherwise unremarkable.  Laboratory evaluation with a hemoglobin of 9.8 which is improved for this patient, mild hypokalemia to 3.2, hyponatremia 133, lactate unremarkable, troponin unremarkable, CK negative, COVID and flu negative.  Chest x-ray unremarkable, CT PE with no evidence of PE and overall unremarkable.  I spoke with the patient's oncologist Dr. Lindi Adie who had been previously treating the patient's tachycardia as a side effect from her Cytoxan, but the patient has been off of this medication for more than 3 weeks and we both agree that a persistent tachycardia of greater than 120 is likely not medication side effect.  The patient did have a course of Adriamycin that may have caused cardiotoxicity and the patient would benefit from an echocardiogram and cardiology consultation.  Patient will be admitted to medicine for persistent shortness of breath, persistent tachycardia not responsive to fluids and need for echocardiogram. Final Clinical Impression(s) / ED Diagnoses Final diagnoses:  None    Rx / DC Orders ED Discharge Orders     None        Lillybeth Tal, Debe Coder, MD 05/21/21 1446

## 2021-05-21 NOTE — H&P (Signed)
History and Physical    Amber White QVZ:[id number] DOB: 04-22-1976 DOA: 05/21/2021  PCP: [name], MD  Patient coming from: Home.  I have personally briefly reviewed patient's old medical records in [place]  Chief Complaint: Shortness of breath.  HPI: Amber White is a 45 y.o. female with medical history significant of morbid obesity, breast cancer who is coming to the emergency department due to dyspnea on exertion that has gotten progressively worse over the past 2 weeks.  She has palpitations and lightheadedness, but she denied chest pain, diaphoresis, lower extremity edema, PND or orthopnea.  No fever, chills, sore throat, rhinorrhea, wheezing or hemoptysis.  No abdominal pain, nausea, emesis, diarrhea, constipation, melena or hematochezia.  No dysuria, frequency or hematuria.  No polyuria, polydipsia, polyphagia or blurred vision.  ED Course: 98.2 F, pulse 107, respirations 20, BP 124/102 mmHg O2 sat 98% on room air.  The patient received a 1000 mL LR bolus in the emergency department.  Lab work: CBC showed a white count of 11.1, hemoglobin 9.8 g/dL and platelets 417.  Lactic acid x2 was normal.  Troponin x2 was normal.  CMP showed sodium 133 and potassium 3.2 mmol/L.  Glucose 136 and calcium 8.8 mg/dL.  Albumin is 3.4 g/dL.  Renal and the rest of the hepatic function measurements are within normal limits.  TSH was 0.172.  Total CK was normal.  Imaging: 2 view chest radiograph did not show any acute cardiopulmonary pathology.  CTA chest no PE or other acute chest process.  Previously demonstrated axillary lymphadenopathy has resolved.  No evidence of metastatic disease.  Please see images and full radiology report for further details.  Review of Systems: As per HPI otherwise all other systems reviewed and are negative.  Past Medical History:  Diagnosis Date   breast ca dx'd 10/2020   right   Family history of breast cancer    No pertinent past medical history    Past  Surgical History:  Procedure Laterality Date   CHOLECYSTECTOMY     PORTACATH PLACEMENT N/A 11/21/2020   Procedure: INSERTION PORT-A-CATH;  Surgeon: Donnie Mesa, MD;  Location: [facility];  Service: General;  Laterality: N/A;   Social History  reports that she has never smoked. She has never used smokeless tobacco. She reports current alcohol use. She reports that she does not use drugs.  Allergies  Allergen Reactions   Paclitaxel Nausea Only and Other (See Comments)    Complained of chest burning and nausea. Resolved with famotidine, methylprednisolone, and diphenhydramine. Resumed paclitaxel and completed without further incident.    Penicillins Hives   Family History  Problem Relation Age of Onset   Breast cancer Mother    Diabetes Maternal Uncle    Diabetes Maternal Grandmother    Heart disease Maternal Grandmother    Prior to Admission medications   Medication Sig Start Date End Date Taking? Authorizing Provider  ADDERALL XR 30 MG 24 hr capsule Take 30 mg by mouth every morning. 03/19/21  Yes [provider]  metoprolol succinate (TOPROL XL) 50 MG 24 hr tablet Take 1 tablet (50 mg total) by mouth in the morning and at bedtime. Patient taking differently: Take 25-50 mg by mouth See admin instructions. Takes 25 mg in the morning and 50 mg at night 04/29/21  Yes Nicholas Lose, MD  omeprazole (PRILOSEC) 40 MG capsule TAKE 1 CAPSULE (40 MG TOTAL) BY MOUTH DAILY. Patient taking differently: Take 40 mg by mouth daily as needed (heartburn). 05/13/21  Yes Nicholas Lose, MD  OVER THE COUNTER MEDICATION Take 1 tablet by mouth daily. Amberen   Yes [provider]  fluconazole (DIFLUCAN) 100 MG tablet Take 1 tablet (100 mg total) by mouth daily. Patient not taking: Reported on 05/21/2021 04/16/21   Nicholas Lose, MD  gabapentin (NEURONTIN) 100 MG capsule Take 3 capsules (300 mg total) by mouth at bedtime. Patient not taking: Reported on 05/21/2021 04/25/21    Nicholas Lose, MD  metroNIDAZOLE (FLAGYL) 500 MG tablet Take 1 tablet (500 mg total) by mouth 2 (two) times daily. Patient not taking: Reported on 05/21/2021 04/17/21   Chase Picket, MD  OLANZapine (ZYPREXA) 10 MG tablet TAKE 0.5 TABLETS (5 MG TOTAL) BY MOUTH AT BEDTIME. START THE DAY AFTER CHEMO AND TAKE FOR THREE DAYS Patient not taking: Reported on 05/21/2021 02/18/21   Gardenia Phlegm, NP  prochlorperazine (COMPAZINE) 10 MG tablet Take 1 tablet (10 mg total) by mouth every 6 (six) hours as needed (Nausea or vomiting). 11/07/20 04/25/21  Nicholas Lose, MD   Physical Exam: Vitals:   05/21/21 1004 05/21/21 1309 05/21/21 1415  BP: (!) 124/102 (!) 126/106 (!) 158/112  Pulse: (!) 127 (!) 121 71  Resp: 20 17 (!) 22  Temp: 98.2 F (36.8 C)    TempSrc: Oral    SpO2: 98% 98% 93%  Weight: 126.6 kg    Height: 5\' 4"  (1.626 m)     Constitutional: NAD, calm, comfortable Eyes: PERRL, lids and conjunctivae normal ENMT: Mucous membranes are moist. Posterior pharynx clear of any exudate or lesions. Neck: normal, supple, no masses, no thyromegaly Respiratory: clear to auscultation bilaterally, no wheezing, no crackles. Normal respiratory effort. No accessory muscle use.  Cardiovascular: Tachycardic in the 110s and 120s, no murmurs / rubs / gallops. No extremity edema. 2+ pedal pulses. No carotid bruits.  Abdomen: Obese, soft, no tenderness, no masses palpated. No hepatosplenomegaly. Bowel sounds positive.  Musculoskeletal: Moderate generalized weakness.  No clubbing / cyanosis. Good ROM, no contractures. Normal muscle tone.  Skin: Acanthosis nigricans skin changes in neck, upper and mid back. Neurologic: CN 2-12 grossly intact. Sensation intact, DTR normal. Strength 5/5 in all 4.  Psychiatric: Normal judgment and insight. Alert and oriented x 3. Normal mood.   Labs on Admission: I have personally reviewed following labs and imaging studies  CBC: Recent Labs  Lab 05/21/21 1026  WBC 7.1   NEUTROABS 4.2  HGB 9.8*  HCT 31.0*  MCV 96.3  PLT 417*    Basic Metabolic Panel: Recent Labs  Lab 05/21/21 1026  NA 133*  K 3.2*  CL 102  CO2 24  GLUCOSE 136*  BUN 6  CREATININE 0.64  CALCIUM 8.8*    GFR: Estimated Creatinine Clearance: 114.6 mL/min (by C-G formula based on SCr of 0.64 mg/dL).  Liver Function Tests: Recent Labs  Lab 05/21/21 1026  AST 31  ALT 17  ALKPHOS 54  BILITOT 0.5  PROT 7.9  ALBUMIN 3.4*    Radiological Exams on Admission: DG Chest 2 View  Result Date: 05/21/2021 CLINICAL DATA:  Shortness of breath. Additional history provided: History of breast cancer, patient reports recently completing chemotherapy. EXAM: CHEST - 2 VIEW COMPARISON:  Chest radiograph 11/21/2020. FINDINGS: A right chest infusion port catheter is present with tip projecting at the level of the lower SVC. Heart size within normal limits. No appreciable airspace consolidation or pulmonary edema. No evidence of pleural effusion or pneumothorax. No acute bony abnormality identified. Mild thoracolumbar dextrocurvature. Surgical clips within the  upper abdomen. IMPRESSION: No evidence of acute cardiopulmonary abnormality. Electronically Signed   By: Kellie Simmering D.O.   On: 05/21/2021 10:55   CT Angio Chest PE W/Cm &/Or Wo Cm  Result Date: 05/21/2021 CLINICAL DATA:  Chest pain and shortness of breath. Pulmonary embolism suspected, high probability. Diagnosed with right breast cancer 7 months ago. EXAM: CT ANGIOGRAPHY CHEST WITH CONTRAST TECHNIQUE: Multidetector CT imaging of the chest was performed using the standard protocol during bolus administration of intravenous contrast. Multiplanar CT image reconstructions and MIPs were obtained to evaluate the vascular anatomy. CONTRAST:  66mL OMNIPAQUE IOHEXOL 350 MG/ML SOLN COMPARISON:  CT chest 11/14/2020. FINDINGS: Cardiovascular: The pulmonary arteries are well opacified with contrast to the level of the subsegmental branches. There is no  evidence of acute pulmonary embolism. No acute systemic arterial abnormalities are identified. A right IJ Port-A-Cath extends into the superior aspect of the right atrium. The heart size is normal. There is no pericardial effusion. Mediastinum/Nodes: The previously demonstrated axillary lymphadenopathy has resolved. There are no enlarged mediastinal, hilar or internal mammary lymph nodes. The thyroid gland, trachea and esophagus demonstrate no significant findings. Lungs/Pleura: There is no pleural effusion. The lungs appear clear. Upper abdomen: The visualized upper abdomen appears stable without significant findings. Musculoskeletal/Chest wall: There is no chest wall mass or suspicious osseous finding. No discrete breast masses are identified. Review of the MIP images confirms the above findings. IMPRESSION: 1. No evidence of acute pulmonary embolism or other acute chest process. 2. Previously demonstrated axillary lymphadenopathy has resolved. No evidence of thoracic metastatic disease. Electronically Signed   By: Richardean Sale M.D.   On: 05/21/2021 14:07    Echocardiogram 11/19/2020 IMPRESSIONS:   1. Global longitudinal strain is -16.6%. Left ventricular ejection  fraction, by estimation, is 70 to 75%. The left ventricle has hyperdynamic  function. The left ventricle has no regional wall motion abnormalities.  Left ventricular diastolic parameters  were normal.   2. Right ventricular systolic function is normal. The right ventricular  size is normal.   3. The mitral valve is normal in structure. Trivial mitral valve  regurgitation.   4. The aortic valve is normal in structure. Aortic valve regurgitation is  not visualized.   EKG: Independently reviewed.  Vent. rate 128 BPM PR interval 135 ms QRS duration 92 ms QT/QTcB 412/602 ms P-R-T axes 68 30 68 Sinus tachycardia Consider right atrial enlargement Low voltage, precordial leads  Assessment/Plan Principal Problem:   Dyspnea on  minimal exertion With persistent tachycardia. Last chemo cycle was 3 weeks ago. Observation/telemetry. Continue IV hydration. Resume metoprolol succinate 50 mg p.o. daily. Optimize electrolytes. Check echocardiogram.  Active Problems:   Elevated TSH Check T3 and T3 uptake.    Hypokalemia Replacing. Magnesium was supplemented.    Hyponatremia Continue normal saline. Follow-up sodium level.    Anemia associated with chemotherapy Monitor hematocrit and hemoglobin. Transfuse as needed.    Mild protein malnutrition (HCC) Protein supplementation. Consider nutritional services evaluation.    Morbid obesity (HCC) Lifestyle modifications. Follow-up with PCP.    Estrogen receptor neg breast cancer Follow-up with oncology.    DVT prophylaxis: SCDs. Code Status:   Full code. Family Communication:   Disposition Plan:   Patient is from:  Home.  Anticipated DC to:  Home.  Anticipated DC date:  05/22/2021 or 05/23/2021.  Anticipated DC barriers: Clinical status.  Consults called:   Admission status:  Observation/telemetry.   Severity of Illness: High severity after presenting with progressively worse dyspnea for the  past 2 weeks and persistent tachycardia.  Patient will remain for 24 to 48 hours for further evaluation and work-up.  Reubin Milan MD [facility]  How to contact the [facility] Attending or Consulting provider [place] or covering provider during after hours [place], for this patient?   Check the care team in [facility] and look for a) attending/consulting [facility] provider listed and b) the [facility] team listed Log into [email or web address] and use [place]'s universal password to access. If you do not have the password, please contact the hospital operator. Locate the [facility] provider you are looking for under [facility] and page to a number that you can be directly reached. If you still have difficulty reaching the provider, please page the [facility] (Director on Call) for the  Hospitalists listed on amion for assistance.  05/21/2021, 4:04 PM   This document was prepared using [employer] and may contain some unintended transcription errors.

## 2021-05-21 NOTE — ED Provider Notes (Signed)
Emergency Medicine Provider Triage Evaluation Note  Amber White , a 45 y.o. female  was evaluated in triage.  Pt complains of CP, SOB. Feels like heart is racing. Right breast cancer. Feels weak. Some cough. Mouth feels dry. No back pain, abd pain, emesis. NO LE pain, swelling. No hx of PE, DVT. Not anticoagulated Review of Systems  Positive: CP, SOB, tachycardia Negative: Fever, emesis, back pain, LE edema, pain  Physical Exam  BP (!) 124/102 (BP Location: Right Arm)   Pulse (!) 127   Temp 98.2 F (36.8 C) (Oral)   Resp 20   Ht 5\' 4"  (1.626 m)   Wt 126.6 kg   SpO2 98%   BMI 47.89 kg/m  Gen:   Awake, no distress   Resp:  Normal effort, tachycardia  MSK:   Moves extremities without difficulty, Non tender BLE ABD:  Soft Other:    Medical Decision Making  Medically screening exam initiated at 10:28 AM.  Appropriate orders placed.  Dafna Romo was informed that the remainder of the evaluation will be completed by another provider, this initial triage assessment does not replace that evaluation, and the importance of remaining in the ED until their evaluation is complete.  CP, SOB, tachycardia   Patient needs to be roomed in back. Research officer, political party notified.  Work up started   [place], Silas Flood, PA-C 05/21/21 [place], [place], MD 05/21/21 1205

## 2021-05-21 NOTE — ED Triage Notes (Signed)
Pt arrived via POV, c/o SOB x2 weeks. Denies any other sx. States she is having issues walking even from bed to bathroom without worsening SOB and feeling faint.

## 2021-05-22 ENCOUNTER — Observation Stay (HOSPITAL_COMMUNITY): Payer: No Typology Code available for payment source

## 2021-05-22 ENCOUNTER — Other Ambulatory Visit: Payer: Self-pay

## 2021-05-22 DIAGNOSIS — I429 Cardiomyopathy, unspecified: Secondary | ICD-10-CM | POA: Diagnosis present

## 2021-05-22 DIAGNOSIS — R011 Cardiac murmur, unspecified: Secondary | ICD-10-CM | POA: Diagnosis not present

## 2021-05-22 DIAGNOSIS — Z6841 Body Mass Index (BMI) 40.0 and over, adult: Secondary | ICD-10-CM | POA: Diagnosis not present

## 2021-05-22 DIAGNOSIS — I427 Cardiomyopathy due to drug and external agent: Secondary | ICD-10-CM | POA: Diagnosis not present

## 2021-05-22 DIAGNOSIS — T451X5A Adverse effect of antineoplastic and immunosuppressive drugs, initial encounter: Secondary | ICD-10-CM

## 2021-05-22 DIAGNOSIS — E2740 Unspecified adrenocortical insufficiency: Secondary | ICD-10-CM | POA: Diagnosis present

## 2021-05-22 DIAGNOSIS — Z9049 Acquired absence of other specified parts of digestive tract: Secondary | ICD-10-CM | POA: Diagnosis not present

## 2021-05-22 DIAGNOSIS — R0600 Dyspnea, unspecified: Secondary | ICD-10-CM | POA: Diagnosis present

## 2021-05-22 DIAGNOSIS — Z20822 Contact with and (suspected) exposure to covid-19: Secondary | ICD-10-CM | POA: Diagnosis present

## 2021-05-22 DIAGNOSIS — R0609 Other forms of dyspnea: Secondary | ICD-10-CM | POA: Diagnosis present

## 2021-05-22 DIAGNOSIS — D6481 Anemia due to antineoplastic chemotherapy: Secondary | ICD-10-CM

## 2021-05-22 DIAGNOSIS — Z888 Allergy status to other drugs, medicaments and biological substances status: Secondary | ICD-10-CM | POA: Diagnosis not present

## 2021-05-22 DIAGNOSIS — Z79899 Other long term (current) drug therapy: Secondary | ICD-10-CM | POA: Diagnosis not present

## 2021-05-22 DIAGNOSIS — Z803 Family history of malignant neoplasm of breast: Secondary | ICD-10-CM | POA: Diagnosis not present

## 2021-05-22 DIAGNOSIS — Z833 Family history of diabetes mellitus: Secondary | ICD-10-CM | POA: Diagnosis not present

## 2021-05-22 DIAGNOSIS — E876 Hypokalemia: Secondary | ICD-10-CM | POA: Diagnosis present

## 2021-05-22 DIAGNOSIS — C50911 Malignant neoplasm of unspecified site of right female breast: Secondary | ICD-10-CM | POA: Diagnosis present

## 2021-05-22 DIAGNOSIS — E871 Hypo-osmolality and hyponatremia: Secondary | ICD-10-CM | POA: Diagnosis present

## 2021-05-22 DIAGNOSIS — G629 Polyneuropathy, unspecified: Secondary | ICD-10-CM | POA: Diagnosis present

## 2021-05-22 DIAGNOSIS — R7989 Other specified abnormal findings of blood chemistry: Secondary | ICD-10-CM | POA: Diagnosis not present

## 2021-05-22 DIAGNOSIS — Z8249 Family history of ischemic heart disease and other diseases of the circulatory system: Secondary | ICD-10-CM | POA: Diagnosis not present

## 2021-05-22 DIAGNOSIS — Z880 Allergy status to penicillin: Secondary | ICD-10-CM | POA: Diagnosis not present

## 2021-05-22 LAB — ECHOCARDIOGRAM COMPLETE
AR max vel: 1.7 cm2
AV Area VTI: 1.69 cm2
AV Area mean vel: 1.91 cm2
AV Mean grad: 4 mmHg
AV Peak grad: 8 mmHg
Ao pk vel: 1.41 m/s
Area-P 1/2: 7.29 cm2
Calc EF: 45.6 %
Height: 64 in
S' Lateral: 3.9 cm
Single Plane A2C EF: 43 %
Single Plane A4C EF: 47.4 %
Weight: 4464 oz

## 2021-05-22 LAB — COMPREHENSIVE METABOLIC PANEL
ALT: 16 U/L (ref 0–44)
AST: 24 U/L (ref 15–41)
Albumin: 3 g/dL — ABNORMAL LOW (ref 3.5–5.0)
Alkaline Phosphatase: 42 U/L (ref 38–126)
Anion gap: 6 (ref 5–15)
BUN: 6 mg/dL (ref 6–20)
CO2: 24 mmol/L (ref 22–32)
Calcium: 8.6 mg/dL — ABNORMAL LOW (ref 8.9–10.3)
Chloride: 106 mmol/L (ref 98–111)
Creatinine, Ser: 0.55 mg/dL (ref 0.44–1.00)
GFR, Estimated: >60 mL/min (ref >60)
Glucose, Bld: 101 mg/dL — ABNORMAL HIGH (ref 70–99)
Potassium: 4 mmol/L (ref 3.5–5.1)
Sodium: 136 mmol/L (ref 135–145)
Total Bilirubin: 0.6 mg/dL (ref 0.3–1.2)
Total Protein: 6.9 g/dL (ref 6.5–8.1)

## 2021-05-22 LAB — CBC
HCT: 27.7 % — ABNORMAL LOW (ref 36.0–46.0)
Hemoglobin: 8.7 g/dL — ABNORMAL LOW (ref 12.0–15.0)
MCH: 30.3 pg (ref 26.0–34.0)
MCHC: 31.4 g/dL (ref 30.0–36.0)
MCV: 96.5 fL (ref 80.0–100.0)
Platelets: 359 10*3/uL (ref 150–400)
RBC: 2.87 MIL/uL — ABNORMAL LOW (ref 3.87–5.11)
RDW: 16.4 % — ABNORMAL HIGH (ref 11.5–15.5)
WBC: 6.1 10*3/uL (ref 4.0–10.5)
nRBC: 0 % (ref 0.0–0.2)

## 2021-05-22 LAB — PREPARE RBC (CROSSMATCH)

## 2021-05-22 LAB — HIV ANTIBODY (ROUTINE TESTING W REFLEX): HIV Screen 4th Generation wRfx: NONREACTIVE

## 2021-05-22 LAB — T4, FREE: Free T4: 0.6 ng/dL — ABNORMAL LOW (ref 0.61–1.12)

## 2021-05-22 MED ORDER — SODIUM CHLORIDE 0.9% IV SOLUTION: Freq: Once | INTRAVENOUS | Status: AC

## 2021-05-22 MED ORDER — CHLORHEXIDINE GLUCONATE CLOTH 2 % EX PADS
6.0000 | MEDICATED_PAD | Freq: Every day | CUTANEOUS | Status: DC
  Administered 2021-05-22 – 2021-05-24 (×3): 6 via TOPICAL

## 2021-05-22 MED ORDER — FUROSEMIDE 10 MG/ML IJ SOLN
40.0000 mg | Freq: Once | INTRAMUSCULAR | Status: AC
  Administered 2021-05-22: 40 mg via INTRAVENOUS
  Filled 2021-05-22: qty 4

## 2021-05-22 MED ORDER — SODIUM CHLORIDE 0.9% FLUSH
10.0000 mL | INTRAVENOUS | Status: DC | PRN
  Administered 2021-05-23: 10 mL

## 2021-05-22 NOTE — Progress Notes (Signed)
   05/22/21 1810  Assess: MEWS Score  Temp 98.7 F (37.1 C)  BP 137/76  Pulse Rate (!) 106  ECG Heart Rate (!) 119  Resp 18  Assess: MEWS Score  MEWS Temp 0  MEWS Systolic 0  MEWS Pulse 2  MEWS RR 0  MEWS LOC 0  MEWS Score 2  MEWS Score Color Yellow  Assess: if the MEWS score is Yellow or Red  Were vital signs taken at a resting state? Yes  Focused Assessment Change from prior assessment (see assessment flowsheet)  Does the patient meet 2 or more of the SIRS criteria? Yes  Does the patient have a confirmed or suspected source of infection? No  MEWS guidelines implemented *See Row Information* Yes  Treat  MEWS Interventions Escalated (See documentation below)  Pain Scale 0-10  Pain Score 0  Take Vital Signs  Increase Vital Sign Frequency  Yellow: Q 2hr X 2 then Q 4hr X 2, if remains yellow, continue Q 4hrs  Escalate  MEWS: Escalate Yellow: discuss with charge nurse/RN and consider discussing with provider and RRT  Notify: Charge Nurse/RN  Name of Charge Nurse/RN Notified Pamala Hurry RN  Date Charge Nurse/RN Notified 05/22/21  Time Charge Nurse/RN Notified 1858  Notify: Provider  Provider Name/Title Dr. Cruzita Lederer  Date Provider Notified 05/22/21  Time Provider Notified 1859  Notification Type Page  Notification Reason Other (Comment) (Tachycardia)  Provider response No new orders  Date of Provider Response 05/22/21  Time of Provider Response 1859  Document  Patient Outcome Other (Comment)  Progress note created (see row info) Yes  Assess: SIRS CRITERIA  SIRS Temperature  0  SIRS Pulse 1  SIRS Respirations  0  SIRS WBC 0  SIRS Score Sum  1   Pt in yellow mews due to HR. MD and charge nurse notified. No new orders.

## 2021-05-22 NOTE — Plan of Care (Signed)

## 2021-05-22 NOTE — Progress Notes (Addendum)
PROGRESS NOTE  Amber White RJJ:[id number] DOB: Sep 02, 1975 DOA: 05/21/2021 PCP: [name], MD   LOS: 0 days   Brief Narrative / Interim history: 45 year old female with history of obesity class III with a BMI of 47, breast cancer on chemotherapy who comes to the hospital with dyspnea on exertion that has been constant, worsening over the last few weeks.  She found herself barely able to go up a flight of stairs, and just walking from the bedroom to the bathroom has become quite tenuous for her.  She was diagnosed with breast cancer earlier this year, has been on chemotherapy and had mastectomy and axillary lymph node dissection in May.  This was followed by adjuvant radiation therapy. also has developed sinus tachycardia for which she has been placed on metoprolol  Subjective / 24h Interval events: She is doing well at rest, tells me that earlier this morning she got up to the bedside saying and was out of breath.  She denies any chest pain with this but does feel very weak and fatigued  Assessment & Plan: Principal Problem Dyspnea on exertion-somewhat unclear at this point, she is more anemic than her baseline and that may explain.  A CT angiogram on admission was negative for acute PE or any other acute chest processes.  Obtain a 2D echo, transfuse unit of packed red blood cells and monitor clinically.  She remains quite tachypneic with minimal activities -I will try Lasix x1 today.  Obtain a BNP  Active Problems Breast cancer-outpatient management, discussed with Dr. Lindi Adie over the phone today.  CT scan this admission shows that axillary lymphadenopathy has resolved and there is no evidence of thoracic metastatic disease  Symptomatic anemia-likely in the setting of chemotherapy, hemoglobin is in the 8 range but given profound DOE, transfuse 1 U pRBC.  Discussed with oncology.  Peripheral neuropathy-Taxol was discontinued  Sinus tachycardia-continue home metoprolol.  2D echo is  pending  Abnormal TSH-obtain free T3 and T4  Scheduled Meds:  sodium chloride   Intravenous Once   Chlorhexidine Gluconate Cloth  6 each Topical Daily   metoprolol succinate  25 mg Oral Daily   And   metoprolol succinate  50 mg Oral QHS   Continuous Infusions: PRN Meds:.acetaminophen **OR** acetaminophen, ondansetron **OR** ondansetron (ZOFRAN) IV, sodium chloride flush  Diet Orders (From admission, onward)     Start     Ordered   05/21/21 1550  Diet Heart Room service appropriate? Yes; Fluid consistency: Thin  Diet effective now       Question Answer Comment  Room service appropriate? Yes   Fluid consistency: Thin      05/21/21 1550            DVT prophylaxis: SCDs Start: 05/21/21 1549     Code Status: Full Code  Family Communication: Sister present at bedside  Status is: Inpatient  Remains inpatient appropriate because persistent symptoms  Level of care: Telemetry  Consultants:  none  Procedures:  2D echo  Microbiology  none  Antimicrobials: none    Objective: Vitals:   05/21/21 2209 05/22/21 0003 05/22/21 0313 05/22/21 0846  BP: 119/79 105/70 119/72 131/88  Pulse: (!) 116 86 100 (!) 102  Resp: 20 20 20 16   Temp: 99.6 F (37.6 C) 98.1 F (36.7 C) 98.1 F (36.7 C) 98.7 F (37.1 C)  TempSrc: Oral Oral Oral Oral  SpO2: 99% 97% 100% 100%  Weight:      Height:        Intake/Output  Summary (Last 24 hours) at 05/22/2021 1302 Last data filed at 05/22/2021 0931 Gross per 24 hour  Intake 1140 ml  Output --  Net 1140 ml   Filed Weights   05/21/21 1004  Weight: 126.6 kg    Examination:  Constitutional: NAD Eyes: no scleral icterus ENMT: Mucous membranes are moist.  Neck: normal, supple Respiratory: clear to auscultation bilaterally, no wheezing, no crackles. Normal respiratory effort. No accessory muscle use.  Cardiovascular: Regular rate and rhythm, no murmurs / rubs / gallops.  Trace edema Abdomen: non distended, no tenderness. Bowel  sounds positive.  Musculoskeletal: no clubbing / cyanosis.  Skin: no rashes Neurologic: CN 2-12 grossly intact. Strength 5/5 in all 4.  Psychiatric: Normal judgment and insight. Alert and oriented x 3. Normal mood.   Data Reviewed: I have independently reviewed following labs and imaging studies  CBC: Recent Labs  Lab 05/21/21 1026 05/22/21 0535  WBC 7.1 6.1  NEUTROABS 4.2  --   HGB 9.8* 8.7*  HCT 31.0* 27.7*  MCV 96.3 96.5  PLT 417* 160   Basic Metabolic Panel: Recent Labs  Lab 05/21/21 1026 05/21/21 1244 05/22/21 0535  NA 133*  --  136  K 3.2*  --  4.0  CL 102  --  106  CO2 24  --  24  GLUCOSE 136*  --  101*  BUN 6  --  6  CREATININE 0.64  --  0.55  CALCIUM 8.8*  --  8.6*  MG  --  1.7  --   PHOS  --  4.5  --    Liver Function Tests: Recent Labs  Lab 05/21/21 1026 05/22/21 0535  AST 31 24  ALT 17 16  ALKPHOS 54 42  BILITOT 0.5 0.6  PROT 7.9 6.9  ALBUMIN 3.4* 3.0*   Coagulation Profile: No results for input(s): INR, PROTIME in the last 168 hours. HbA1C: No results for input(s): HGBA1C in the last 72 hours. CBG: No results for input(s): GLUCAP in the last 168 hours.  Recent Results (from the past 240 hour(s))  Blood culture (routine x 2)     Status: None (Preliminary result)   Collection Time: 05/21/21 11:07 AM   Specimen: BLOOD  Result Value Ref Range Status   Specimen Description   Final    BLOOD RIGHT ANTECUBITAL Performed at [facility] [address], [place], [place] [address]    Special Requests   Final    BOTTLES DRAWN AEROBIC AND ANAEROBIC Blood Culture adequate volume Performed at [facility] [address], [place], [place] [address]    Culture   Final    NO GROWTH < 24 HOURS Performed at [facility] [address], [place], [place] [address]    Report Status PENDING  Incomplete  Culture, blood (Routine X 2) w Reflex to ID Panel     Status: None (Preliminary result)   Collection  Time: 05/21/21 12:45 PM   Specimen: BLOOD  Result Value Ref Range Status   Specimen Description   Final    BLOOD LEFT ANTECUBITAL Performed at [facility] [address] [place], [place] [address]    Special Requests   Final    BOTTLES DRAWN AEROBIC AND ANAEROBIC Blood Culture adequate volume Performed at [facility] [address] [place],  [address]    Culture   Final    NO GROWTH < 12 HOURS Performed at [facility] [address],  [place], [place] [address]    Report Status PENDING  Incomplete  Resp Panel by RT-PCR (Flu A&B, Covid) Nasopharyngeal Swab     Status: None   Collection Time: 05/21/21 12:53 PM   Specimen: Nasopharyngeal Swab; Nasopharyngeal(NP) swabs in vial transport medium  Result Value Ref Range Status   SARS Coronavirus 2 by RT PCR NEGATIVE NEGATIVE Final    Comment: (NOTE) SARS-CoV-2 target nucleic acids are NOT DETECTED.  The SARS-CoV-2 RNA is generally detectable in upper respiratory specimens during the acute phase of infection. The lowest concentration of SARS-CoV-2 viral copies this assay can detect is 138 copies/mL. A negative result does not preclude SARS-Cov-2 infection and should not be used as the sole basis for treatment or other patient management decisions. A negative result may occur with  improper specimen collection/handling, submission of specimen other than nasopharyngeal swab, presence of viral mutation(s) within the areas targeted by this assay, and inadequate number of viral copies(<138 copies/mL). A negative result must be combined with clinical observations, patient history, and epidemiological information. The expected result is Negative.  Fact Sheet for Patients:  EntrepreneurPulse.com.au  Fact Sheet for Healthcare Providers:  IncredibleEmployment.be  This test is no t yet approved or cleared by the [place] FDA and  has been  authorized for detection and/or diagnosis of SARS-CoV-2 by FDA under an Emergency Use Authorization (EUA). This EUA will remain  in effect (meaning this test can be used) for the duration of the COVID-19 declaration under Section 564(b)(1) of the Act, 21 U.S.C.section 360bbb-3(b)(1), unless the authorization is terminated  or revoked sooner.       Influenza A by PCR NEGATIVE NEGATIVE Final   Influenza B by PCR NEGATIVE NEGATIVE Final    Comment: (NOTE) The Xpert Xpress SARS-CoV-2/FLU/RSV plus assay is intended as an aid in the diagnosis of influenza from Nasopharyngeal swab specimens and should not be used as a sole basis for treatment. Nasal washings and aspirates are unacceptable for Xpert Xpress SARS-CoV-2/FLU/RSV testing.  Fact Sheet for Patients: EntrepreneurPulse.com.au  Fact Sheet for Healthcare Providers: IncredibleEmployment.be  This test is not yet approved or cleared by the [place] FDA and has been authorized for detection and/or diagnosis of SARS-CoV-2 by FDA under an Emergency Use Authorization (EUA). This EUA will remain in effect (meaning this test can be used) for the duration of the COVID-19 declaration under Section 564(b)(1) of the Act, 21 U.S.C. section 360bbb-3(b)(1), unless the authorization is terminated or revoked.  Performed at [facility], [place] [facility] [address], [place], [place] [address]      Radiology Studies: CT Angio Chest PE W/Cm &/Or Wo Cm  Result Date: 05/21/2021 CLINICAL DATA:  Chest pain and shortness of breath. Pulmonary embolism suspected, high probability. Diagnosed with right breast cancer 7 months ago. EXAM: CT ANGIOGRAPHY CHEST WITH CONTRAST TECHNIQUE: Multidetector CT imaging of the chest was performed using the standard protocol during bolus administration of intravenous contrast. Multiplanar CT image reconstructions and MIPs were obtained to evaluate the vascular anatomy.  CONTRAST:  55mL OMNIPAQUE IOHEXOL 350 MG/ML SOLN COMPARISON:  CT chest 11/14/2020. FINDINGS: Cardiovascular: The pulmonary arteries are well opacified with contrast to the level of the subsegmental branches. There is no evidence of acute pulmonary embolism. No acute systemic arterial abnormalities are identified. A right IJ Port-A-Cath extends into the superior aspect of the right atrium. The heart size is normal. There is no pericardial effusion. Mediastinum/Nodes: The previously demonstrated axillary lymphadenopathy has resolved. There are no enlarged mediastinal, hilar or internal mammary  lymph nodes. The thyroid gland, trachea and esophagus demonstrate no significant findings. Lungs/Pleura: There is no pleural effusion. The lungs appear clear. Upper abdomen: The visualized upper abdomen appears stable without significant findings. Musculoskeletal/Chest wall: There is no chest wall mass or suspicious osseous finding. No discrete breast masses are identified. Review of the MIP images confirms the above findings. IMPRESSION: 1. No evidence of acute pulmonary embolism or other acute chest process. 2. Previously demonstrated axillary lymphadenopathy has resolved. No evidence of thoracic metastatic disease. Electronically Signed   By: Richardean Sale M.D.   On: 05/21/2021 14:07    Marzetta Board, MD, PhD [facility]  Between 7 am - 7 pm I am available, please contact me via Amion (for emergencies) or Securechat (non urgent messages)  Between 7 pm - 7 am I am not available, please contact night coverage MD/APP via Amion

## 2021-05-22 NOTE — Progress Notes (Signed)
The patient is injury-free, afebrile, alert, and oriented X 3. Pt's tachycardia is improving. She experiences tachypnea and tachycardia with exertion. The rest of vital signs were within the baseline during this shift.  Pt denies chest pain, SOB, nausea, vomiting, dizziness, signs or symptoms of bleeding or infection or acute changes during this shift. We will continue to monitor and work toward achieving the care plan goals

## 2021-05-23 DIAGNOSIS — E2740 Unspecified adrenocortical insufficiency: Principal | ICD-10-CM

## 2021-05-23 DIAGNOSIS — D6481 Anemia due to antineoplastic chemotherapy: Secondary | ICD-10-CM | POA: Diagnosis not present

## 2021-05-23 DIAGNOSIS — R7989 Other specified abnormal findings of blood chemistry: Secondary | ICD-10-CM

## 2021-05-23 DIAGNOSIS — T451X5A Adverse effect of antineoplastic and immunosuppressive drugs, initial encounter: Secondary | ICD-10-CM | POA: Diagnosis not present

## 2021-05-23 DIAGNOSIS — R0609 Other forms of dyspnea: Secondary | ICD-10-CM | POA: Diagnosis not present

## 2021-05-23 DIAGNOSIS — I427 Cardiomyopathy due to drug and external agent: Secondary | ICD-10-CM

## 2021-05-23 LAB — BASIC METABOLIC PANEL
Anion gap: 8 (ref 5–15)
BUN: 6 mg/dL (ref 6–20)
CO2: 26 mmol/L (ref 22–32)
Calcium: 9.1 mg/dL (ref 8.9–10.3)
Chloride: 101 mmol/L (ref 98–111)
Creatinine, Ser: 0.67 mg/dL (ref 0.44–1.00)
GFR, Estimated: >60 mL/min (ref >60)
Glucose, Bld: 106 mg/dL — ABNORMAL HIGH (ref 70–99)
Potassium: 3.8 mmol/L (ref 3.5–5.1)
Sodium: 135 mmol/L (ref 135–145)

## 2021-05-23 LAB — CBC
HCT: 33.7 % — ABNORMAL LOW (ref 36.0–46.0)
Hemoglobin: 10.9 g/dL — ABNORMAL LOW (ref 12.0–15.0)
MCH: 30.5 pg (ref 26.0–34.0)
MCHC: 32.3 g/dL (ref 30.0–36.0)
MCV: 94.4 fL (ref 80.0–100.0)
Platelets: 408 10*3/uL — ABNORMAL HIGH (ref 150–400)
RBC: 3.57 MIL/uL — ABNORMAL LOW (ref 3.87–5.11)
RDW: 16 % — ABNORMAL HIGH (ref 11.5–15.5)
WBC: 7.1 10*3/uL (ref 4.0–10.5)
nRBC: 0 % (ref 0.0–0.2)

## 2021-05-23 LAB — BPAM RBC
Blood Product Expiration Date: 202212102359
ISSUE DATE / TIME: 202211091741
Unit Type and Rh: 5100

## 2021-05-23 LAB — T3, FREE: T3, Free: 3.3 pg/mL (ref 2.0–4.4)

## 2021-05-23 LAB — T3: T3, Total: 212 ng/dL — ABNORMAL HIGH (ref 71–180)

## 2021-05-23 LAB — TYPE AND SCREEN
ABO/RH(D): O POS
Antibody Screen: NEGATIVE
Unit division: 0

## 2021-05-23 LAB — T3 UPTAKE: T3 Uptake Ratio: 20 % — ABNORMAL LOW (ref 24–39)

## 2021-05-23 LAB — BRAIN NATRIURETIC PEPTIDE: B Natriuretic Peptide: 35.8 pg/mL (ref 0.0–100.0)

## 2021-05-23 LAB — CORTISOL-AM, BLOOD: Cortisol - AM: <0.4 ug/dL — ABNORMAL LOW (ref 6.7–22.6)

## 2021-05-23 MED ORDER — HYDROCORTISONE SOD SUC (PF) 100 MG IJ SOLR
50.0000 mg | Freq: Once | INTRAMUSCULAR | Status: AC
  Administered 2021-05-23: 50 mg via INTRAVENOUS
  Filled 2021-05-23: qty 2

## 2021-05-23 MED ORDER — FUROSEMIDE 10 MG/ML IJ SOLN
40.0000 mg | Freq: Once | INTRAMUSCULAR | Status: AC
  Administered 2021-05-23: 40 mg via INTRAVENOUS
  Filled 2021-05-23: qty 4

## 2021-05-23 MED ORDER — HYDROCORTISONE 20 MG PO TABS
20.0000 mg | ORAL_TABLET | Freq: Every day | ORAL | Status: DC
  Administered 2021-05-24: 20 mg via ORAL
  Filled 2021-05-23: qty 1

## 2021-05-23 MED ORDER — BISOPROLOL FUMARATE 5 MG PO TABS
10.0000 mg | ORAL_TABLET | Freq: Every day | ORAL | Status: DC
  Administered 2021-05-23 – 2021-05-24 (×2): 10 mg via ORAL
  Filled 2021-05-23 (×2): qty 2

## 2021-05-23 MED ORDER — HYDROCORTISONE 10 MG PO TABS
10.0000 mg | ORAL_TABLET | Freq: Every day | ORAL | Status: DC
  Administered 2021-05-23: 10 mg via ORAL
  Filled 2021-05-23 (×2): qty 1

## 2021-05-23 MED ORDER — CARVEDILOL 6.25 MG PO TABS: 6.2500 mg | ORAL_TABLET | Freq: Two times a day (BID) | ORAL | Status: DC

## 2021-05-23 NOTE — Progress Notes (Signed)
PROGRESS NOTE  Amber White OIB:[id number] DOB: July 07, 1976 DOA: 05/21/2021 PCP: [name], MD   LOS: 1 day   Brief Narrative / Interim history: 45 year old female with history of obesity class III with a BMI of 47, breast cancer on chemotherapy who comes to the hospital with dyspnea on exertion that has been constant, worsening over the last few weeks.  She found herself barely able to go up a flight of stairs, and just walking from the bedroom to the bathroom has become quite tenuous for her.  She was diagnosed with breast cancer earlier this year, has been on chemotherapy and had mastectomy and axillary lymph node dissection in May.  This was followed by adjuvant radiation therapy. also has developed sinus tachycardia for which she has been placed on metoprolol  Subjective / 24h Interval events: Feeling a little bit better, she was able to see the date of the bed and walk to the bathroom with less effort, but then upon ambulation in the hallway she became quite short of breath and her heart rate went into the 140s  Assessment & Plan: Principal Problem Dyspnea on exertion-somewhat unclear at this point, she is more anemic than her baseline and that may explain.  A CT angiogram on admission was negative for acute PE or any other acute chest processes.  2D echo was done which showed low normal EF at 50% and LV hypokinesis, which is new from her prior echo -Received blood transfusion yesterday as well as Lasix, seems to be better today but still quite symptomatic -Given changes in echo, cardiology consulted today  Active Problems Breast cancer-outpatient management, discussed with Dr. Lindi Adie over the phone on 11/9.  CT scan this admission shows that axillary lymphadenopathy has resolved and there is no evidence of thoracic metastatic disease  Adrenal insufficiency-cortisol is undetectably low.  This is likely adrenal insufficiency, she has not received any steroids recently.  Keytruda as well  as prior steroids could definitely contribute to this.  Discussed with oncology.  Obtain brain MRI/pituitary protocol, start steroids.  Will need outpatient follow-up with endocrine  Symptomatic anemia-likely in the setting of chemotherapy, hemoglobin is in the 8 range but given profound DOE she was transfused unit of packed red blood cells.  This was discussed with oncology.  Hemoglobin in the 10 range today  Peripheral neuropathy-Taxol was discontinued  Sinus tachycardia-appreciate cardiology input  Abnormal, decreased TSH-T3, T4 unremarkable.   Scheduled Meds:  bisoprolol  10 mg Oral Daily   Chlorhexidine Gluconate Cloth  6 each Topical Daily   furosemide  40 mg Intravenous Once   Continuous Infusions: PRN Meds:.acetaminophen **OR** acetaminophen, ondansetron **OR** ondansetron (ZOFRAN) IV, sodium chloride flush  Diet Orders (From admission, onward)     Start     Ordered   05/21/21 1550  Diet Heart Room service appropriate? Yes; Fluid consistency: Thin  Diet effective now       Question Answer Comment  Room service appropriate? Yes   Fluid consistency: Thin      05/21/21 1550            DVT prophylaxis: SCDs Start: 05/21/21 1549     Code Status: Full Code  Family Communication: Sister present at bedside  Status is: Inpatient  Remains inpatient appropriate because persistent symptoms  Level of care: Telemetry  Consultants:  none  Procedures:  2D echo  Microbiology  none  Antimicrobials: none    Objective: Vitals:   05/23/21 0053 05/23/21 0222 05/23/21 0418 05/23/21 0630  BP:  115/63 106/73 126/84 122/82  Pulse: (!) 102 (!) 104 (!) 105 100  Resp: 18 18 18 20   Temp: 99.7 F (37.6 C) 99.3 F (37.4 C) 98.6 F (37 C) 98.8 F (37.1 C)  TempSrc: Oral Oral Oral Oral  SpO2: 98% 100% 98% 98%  Weight:      Height:        Intake/Output Summary (Last 24 hours) at 05/23/2021 1146 Last data filed at 05/22/2021 2053 Gross per 24 hour  Intake 1283.25 ml   Output --  Net 1283.25 ml    Filed Weights   05/21/21 1004  Weight: 126.6 kg    Examination:  Constitutional: NAD Eyes: No scleral icterus ENMT: mmm Neck: normal, supple Respiratory: Clear bilaterally, no wheezing, no crackles Cardiovascular: Regular rate and rhythm, no murmurs, trace edema Abdomen: Soft, NT, ND, bowel sounds positive Musculoskeletal: no clubbing / cyanosis.  Skin: No rashes seen Neurologic: Nonfocal, equal strength Psychiatric: Normal judgment and insight. Alert and oriented x 3. Normal mood.   Data Reviewed: I have independently reviewed following labs and imaging studies  CBC: Recent Labs  Lab 05/21/21 1026 05/22/21 0535 05/23/21 0545  WBC 7.1 6.1 7.1  NEUTROABS 4.2  --   --   HGB 9.8* 8.7* 10.9*  HCT 31.0* 27.7* 33.7*  MCV 96.3 96.5 94.4  PLT 417* 359 408*    Basic Metabolic Panel: Recent Labs  Lab 05/21/21 1026 05/21/21 1244 05/22/21 0535 05/23/21 0545  NA 133*  --  136 135  K 3.2*  --  4.0 3.8  CL 102  --  106 101  CO2 24  --  24 26  GLUCOSE 136*  --  101* 106*  BUN 6  --  6 6  CREATININE 0.64  --  0.55 0.67  CALCIUM 8.8*  --  8.6* 9.1  MG  --  1.7  --   --   PHOS  --  4.5  --   --     Liver Function Tests: Recent Labs  Lab 05/21/21 1026 05/22/21 0535  AST 31 24  ALT 17 16  ALKPHOS 54 42  BILITOT 0.5 0.6  PROT 7.9 6.9  ALBUMIN 3.4* 3.0*    Coagulation Profile: No results for input(s): INR, PROTIME in the last 168 hours. HbA1C: No results for input(s): HGBA1C in the last 72 hours. CBG: No results for input(s): GLUCAP in the last 168 hours.  Recent Results (from the past 240 hour(s))  Blood culture (routine x 2)     Status: None (Preliminary result)   Collection Time: 05/21/21 11:07 AM   Specimen: BLOOD  Result Value Ref Range Status   Specimen Description   Final    BLOOD RIGHT ANTECUBITAL Performed at [facility] [address], [place], [place] [address]    Special Requests   Final     BOTTLES DRAWN AEROBIC AND ANAEROBIC Blood Culture adequate volume Performed at [facility] [address] [place], [place] [address]    Culture   Final    NO GROWTH 2 DAYS Performed at [facility] [address], [place], [place] [address]    Report Status PENDING  Incomplete  Culture, blood (Routine X 2) w Reflex to ID Panel     Status: None (Preliminary result)   Collection Time: 05/21/21 12:45 PM   Specimen: BLOOD  Result Value Ref Range Status   Specimen Description   Final    BLOOD LEFT ANTECUBITAL Performed at [facility], [place] [address] [place], [place] [address]    Special Requests   Final    BOTTLES DRAWN AEROBIC AND ANAEROBIC Blood Culture adequate volume Performed at [facility] [address], [place], [place] [address]    Culture   Final    NO GROWTH 2 DAYS Performed at [facility] [address], [place], [place] [address]    Report Status PENDING  Incomplete  Resp Panel by RT-PCR (Flu A&B, Covid) Nasopharyngeal Swab     Status: None   Collection Time: 05/21/21 12:53 PM   Specimen: Nasopharyngeal Swab; Nasopharyngeal(NP) swabs in vial transport medium  Result Value Ref Range Status   SARS Coronavirus 2 by RT PCR NEGATIVE NEGATIVE Final    Comment: (NOTE) SARS-CoV-2 target nucleic acids are NOT DETECTED.  The SARS-CoV-2 RNA is generally detectable in upper respiratory specimens during the acute phase of infection. The lowest concentration of SARS-CoV-2 viral copies this assay can detect is 138 copies/mL. A negative result does not preclude SARS-Cov-2 infection and should not be used as the sole basis for treatment or other patient management decisions. A negative result may occur with  improper specimen collection/handling, submission of specimen other than nasopharyngeal swab, presence of viral mutation(s) within the areas targeted by this assay, and inadequate number of  viral copies(<138 copies/mL). A negative result must be combined with clinical observations, patient history, and epidemiological information. The expected result is Negative.  Fact Sheet for Patients:  EntrepreneurPulse.com.au  Fact Sheet for Healthcare Providers:  IncredibleEmployment.be  This test is no t yet approved or cleared by the [place] FDA and  has been authorized for detection and/or diagnosis of SARS-CoV-2 by FDA under an Emergency Use Authorization (EUA). This EUA will remain  in effect (meaning this test can be used) for the duration of the COVID-19 declaration under Section 564(b)(1) of the Act, 21 U.S.C.section 360bbb-3(b)(1), unless the authorization is terminated  or revoked sooner.       Influenza A by PCR NEGATIVE NEGATIVE Final   Influenza B by PCR NEGATIVE NEGATIVE Final    Comment: (NOTE) The Xpert Xpress SARS-CoV-2/FLU/RSV plus assay is intended as an aid in the diagnosis of influenza from Nasopharyngeal swab specimens and should not be used as a sole basis for treatment. Nasal washings and aspirates are unacceptable for Xpert Xpress SARS-CoV-2/FLU/RSV testing.  Fact Sheet for Patients: EntrepreneurPulse.com.au  Fact Sheet for Healthcare Providers: IncredibleEmployment.be  This test is not yet approved or cleared by the [place] FDA and has been authorized for detection and/or diagnosis of SARS-CoV-2 by FDA under an Emergency Use Authorization (EUA). This EUA will remain in effect (meaning this test can be used) for the duration of the COVID-19 declaration under Section 564(b)(1) of the Act, 21 U.S.C. section 360bbb-3(b)(1), unless the authorization is terminated or revoked.  Performed at [facility], [place] [address] [place], [place] [address]       Radiology Studies: ECHOCARDIOGRAM COMPLETE  Result Date: 05/22/2021    ECHOCARDIOGRAM REPORT    Patient Name:   Amber White Date of Exam: 05/22/2021 Medical Rec #:  [id number]   Height:       64.0 in Accession #:    [id number]  Weight:       279.0 lb Date of Birth:  12-19-1975    BSA:          2.254 m Patient Age:    25 years    BP:  131/88 mmHg Patient Gender: F           HR:           97 bpm. Exam Location:  Inpatient Procedure: 2D Echo, Cardiac Doppler, Color Doppler and Strain Analysis Indications:     Murmur  History:         Patient has prior history of Echocardiogram examinations, most                  recent 11/19/2020. Signs/Symptoms:Dyspnea; Risk Factors:Chemo.  Sonographer:     Glo Herring Referring Phys:  [id number] [place] Diagnosing Phys: Franki Monte IMPRESSIONS  1. Left ventricular ejection fraction, by estimation, is 50%. The left ventricle has mildly decreased function. The left ventricle demonstrates global hypokinesis. Left ventricular diastolic parameters are consistent with Grade I diastolic dysfunction (impaired relaxation). The average left ventricular global longitudinal strain is -15.7 %. The global longitudinal strain is abnormal.  2. Right ventricular systolic function is normal. The right ventricular size is normal. There is normal pulmonary artery systolic pressure. The estimated right ventricular systolic pressure is 88.3 mmHg.  3. The mitral valve is normal in structure. Trivial mitral valve regurgitation. No evidence of mitral stenosis.  4. The aortic valve is tricuspid. Aortic valve regurgitation is not visualized. No aortic stenosis is present.  5. The inferior vena cava is normal in size with greater than 50% respiratory variability, suggesting right atrial pressure of 3 mmHg. FINDINGS  Left Ventricle: Left ventricular ejection fraction, by estimation, is 50%. The left ventricle has mildly decreased function. The left ventricle demonstrates global hypokinesis. The average left ventricular global longitudinal strain is -15.7 %. The global longitudinal  strain is abnormal. The left ventricular internal cavity size was normal in size. There is no left ventricular hypertrophy. Left ventricular diastolic parameters are consistent with Grade I diastolic dysfunction (impaired relaxation). Right Ventricle: The right ventricular size is normal. No increase in right ventricular wall thickness. Right ventricular systolic function is normal. There is normal pulmonary artery systolic pressure. The tricuspid regurgitant velocity is 2.27 m/s, and  with an assumed right atrial pressure of 3 mmHg, the estimated right ventricular systolic pressure is 37.4 mmHg. Left Atrium: Left atrial size was normal in size. Right Atrium: Right atrial size was normal in size. Pericardium: There is no evidence of pericardial effusion. Mitral Valve: The mitral valve is normal in structure. Trivial mitral valve regurgitation. No evidence of mitral valve stenosis. Tricuspid Valve: The tricuspid valve is normal in structure. Tricuspid valve regurgitation is trivial. Aortic Valve: The aortic valve is tricuspid. Aortic valve regurgitation is not visualized. No aortic stenosis is present. Aortic valve mean gradient measures 4.0 mmHg. Aortic valve peak gradient measures 8.0 mmHg. Aortic valve area, by VTI measures 1.69 cm. Pulmonic Valve: The pulmonic valve was normal in structure. Pulmonic valve regurgitation is not visualized. Aorta: The aortic root is normal in size and structure. Venous: The inferior vena cava is normal in size with greater than 50% respiratory variability, suggesting right atrial pressure of 3 mmHg. IAS/Shunts: No atrial level shunt detected by color flow Doppler.  LEFT VENTRICLE PLAX 2D LVIDd:         5.30 cm      Diastology LVIDs:         3.90 cm      LV e' medial:    7.94 cm/s LV PW:         1.10 cm      LV E/e' medial:  10.3 LV  IVS:        1.10 cm      LV e' lateral:   11.30 cm/s LVOT diam:     2.00 cm      LV E/e' lateral: 7.2 LV SV:         49 LV SV Index:   22           2D  Longitudinal Strain LVOT Area:     3.14 cm     2D Strain GLS Avg:     -15.7 %  LV Volumes (MOD) LV vol d, MOD A2C: 124.0 ml LV vol d, MOD A4C: 131.0 ml LV vol s, MOD A2C: 70.7 ml LV vol s, MOD A4C: 68.9 ml LV SV MOD A2C:     53.3 ml LV SV MOD A4C:     131.0 ml LV SV MOD BP:      58.9 ml RIGHT VENTRICLE             IVC RV Basal diam:  3.50 cm     IVC diam: 1.50 cm RV S prime:     13.40 cm/s LEFT ATRIUM           Index        RIGHT ATRIUM           Index LA diam:      3.20 cm 1.42 cm/m   RA Area:     13.10 cm LA Vol (A2C): 44.9 ml 19.92 ml/m  RA Volume:   28.30 ml  12.56 ml/m LA Vol (A4C): 45.0 ml 19.97 ml/m  AORTIC VALVE AV Area (Vmax):    1.70 cm AV Area (Vmean):   1.91 cm AV Area (VTI):     1.69 cm AV Vmax:           141.00 cm/s AV Vmean:          97.600 cm/s AV VTI:            0.288 m AV Peak Grad:      8.0 mmHg AV Mean Grad:      4.0 mmHg LVOT Vmax:         76.40 cm/s LVOT Vmean:        59.300 cm/s LVOT VTI:          0.155 m LVOT/AV VTI ratio: 0.54  AORTA Ao Root diam: 3.20 cm Ao Asc diam:  2.90 cm MITRAL VALVE                TRICUSPID VALVE MV Area (PHT): 7.29 cm     TR Peak grad:   20.6 mmHg MV Decel Time: 104 msec     TR Vmax:        227.00 cm/s MV E velocity: 81.40 cm/s MV A velocity: 102.00 cm/s  SHUNTS MV E/A ratio:  0.80         Systemic VTI:  0.16 m                             Systemic Diam: 2.00 cm Dalton McleanMD Electronically signed by Franki Monte Signature Date/Time: 05/22/2021/6:24:32 PM    Final (Updated)     Marzetta Board, MD, PhD [facility]  Between 7 am - 7 pm I am available, please contact me via Amion (for emergencies) or Securechat (non urgent messages)  Between 7 pm - 7 am I am not available, please contact night coverage MD/APP via Amion

## 2021-05-23 NOTE — Consult Note (Signed)
Cardiology Consultation:   Patient ID: Amber White MRN: [id number]; DOB: 1975-10-17  Admit date: 05/21/2021 Date of Consult: 05/23/2021  PCP:  [name], MD   [place] Prov[facility] Cardiologist:  [facility] (Dr. Debara Pickett)  Patient Profile:   Amber White is a 45 y.o. female with a history of breast cancer and morbid obesity who is being seen for the evaluation of dyspnea on exertion and tachycardia at the request of Dr. Cruzita Lederer.   History of Present Illness:   Amber White is a 45 year old female with no prior cardiac history.  She has no history of hypertension, hyperlipidemia, or diabetes.  She has no smoking history.  She does have a family history of cardiovascular disease with her maternal grandmother having CHF but no known family history of CAD or stroke. Patient was diagnosed with right breast cancer in 10/2020 and started chemotherapy in 11/2020. Based on last Oncology note, she has completed 4 cycles of Adriamycin and Cytoxan Keytruda and is now completing 12 weeks of Taxol weekly with Carboplatin and Keytruda every 3 weeks. She completed 9th cycle of Taxol on 04/29/2021.   Patient presented to the [facility] ED on 05/21/2021 for further evaluation of shortness of breath.  Patient reports that about 2 weeks ago she began to notice some intermittent dyspnea on exertion such as walking into a building from the parking lot.  Initially, this occurred randomly and not on a daily basis.  However, over the last 2 weeks it is gotten progressively worse.  Dyspnea got more severe and was occurring on a daily basis.  She also started having palpitations that she describes as heart racing shortly before onset of dyspnea on exertion.  She saw her Oncologist for this and reportedly had an EKG and states it was slightly irregular.  EKG from oncology office on 04/29/2021 showed sinus tachycardia with heart rate of 103 bpm.  She was started on Toprol-XL which helped some but rates remained elevated.  She also  noted some associated lightheadedness/dizziness and near syncope with quick position changes or if she had been ambulating for a long time.  No overt syncope though.  She denies any chest pain or shortness of breath at rest.  No orthopnea orthopnea or PND.  She notes some mild ankle edema over the last few days.  Denies any recent fevers or illnesses.  She does report feeling very fatigued which seems to be related to when she started Toprol-XL.  She denies any abnormal bleeding in urine or stools.  Upon arrival to the ED, patient mildly tachycardic at times but vitals stable. EKG showed sinus tachycardia, rate 128 bpm, with mild T wave abnormalities in V5-V6. High-sensitivity troponin negative x2. Chest x-ray showed no acute findings. Chest CTA negative for PE. WBC 7.1, Hgb 9.8, Plts 417. Na 133, K 3.2, Glucose 136, BUN 6, Cr 0.64. Albumin 3.4 but other LFTs normal. Magnesium 1.7. Lactic acid 1.1. Blood cultures negative to date. Patient was admitted and transfused 1 unit of PRBCs. She was also given a dose of IV Lasix to see if that would help. Echo was ordered and showed LVEF of 50% with global hypokinesis, grade 1 diastolic dysfunction, and abnormal global longitudinal strain of -15.7. Cardiology consulted for further evaluation.  At the time of this evaluation, patient resting comfortably in no acute distress.  She has not been up to ambulate since she got here so unclear whether her dyspnea has improved with blood transfusion and Lasix.  Past Medical History:  Diagnosis Date   breast ca dx'd 10/2020   right   Family history of breast cancer    No pertinent past medical history     Past Surgical History:  Procedure Laterality Date   CHOLECYSTECTOMY     PORTACATH PLACEMENT N/A 11/21/2020   Procedure: INSERTION PORT-A-CATH;  Surgeon: Donnie Mesa, MD;  Location: [facility];  Service: General;  Laterality: N/A;     Home Medications:  Prior to Admission medications   Medication  Sig Start Date End Date Taking? Authorizing Provider  ADDERALL XR 30 MG 24 hr capsule Take 30 mg by mouth every morning. 03/19/21  Yes [provider]  metoprolol succinate (TOPROL XL) 50 MG 24 hr tablet Take 1 tablet (50 mg total) by mouth in the morning and at bedtime. Patient taking differently: Take 25-50 mg by mouth See admin instructions. Takes 25 mg in the morning and 50 mg at night 04/29/21  Yes Nicholas Lose, MD  omeprazole (PRILOSEC) 40 MG capsule TAKE 1 CAPSULE (40 MG TOTAL) BY MOUTH DAILY. Patient taking differently: Take 40 mg by mouth daily as needed (heartburn). 05/13/21  Yes Nicholas Lose, MD  OVER THE COUNTER MEDICATION Take 1 tablet by mouth daily. Amberen   Yes [provider]  fluconazole (DIFLUCAN) 100 MG tablet Take 1 tablet (100 mg total) by mouth daily. Patient not taking: Reported on 05/21/2021 04/16/21   Nicholas Lose, MD  gabapentin (NEURONTIN) 100 MG capsule Take 3 capsules (300 mg total) by mouth at bedtime. Patient not taking: Reported on 05/21/2021 04/25/21   Nicholas Lose, MD  metroNIDAZOLE (FLAGYL) 500 MG tablet Take 1 tablet (500 mg total) by mouth 2 (two) times daily. Patient not taking: Reported on 05/21/2021 04/17/21   Chase Picket, MD  OLANZapine (ZYPREXA) 10 MG tablet TAKE 0.5 TABLETS (5 MG TOTAL) BY MOUTH AT BEDTIME. START THE DAY AFTER CHEMO AND TAKE FOR THREE DAYS Patient not taking: Reported on 05/21/2021 02/18/21   Gardenia Phlegm, NP  prochlorperazine (COMPAZINE) 10 MG tablet Take 1 tablet (10 mg total) by mouth every 6 (six) hours as needed (Nausea or vomiting). 11/07/20 04/25/21  Nicholas Lose, MD    Inpatient Medications: Scheduled Meds:  Chlorhexidine Gluconate Cloth  6 each Topical Daily   metoprolol succinate  25 mg Oral Daily   And   metoprolol succinate  50 mg Oral QHS   Continuous Infusions:  PRN Meds: acetaminophen **OR** acetaminophen, ondansetron **OR** ondansetron (ZOFRAN) IV, sodium chloride  flush  Allergies:    Allergies  Allergen Reactions   Paclitaxel Nausea Only and Other (See Comments)    Complained of chest burning and nausea. Resolved with famotidine, methylprednisolone, and diphenhydramine. Resumed paclitaxel and completed without further incident.    Penicillins Hives    Social History:   Social History   Socioeconomic History   Marital status: Single    Spouse name: Not on file   Number of children: Not on file   Years of education: Not on file   Highest education level: Not on file  Occupational History   Not on file  Tobacco Use   Smoking status: Never   Smokeless tobacco: Never  Vaping Use   Vaping Use: Never used  Substance and Sexual Activity   Alcohol use: Yes    Comment: rare   Drug use: No   Sexual activity: Yes    Birth control/protection: None  Other Topics Concern   Not on file  Social History Narrative   Not on  file   Social Determinants of Health   Financial Resource Strain: Not on file  Food Insecurity: Not on file  Transportation Needs: Not on file  Physical Activity: Not on file  Stress: Not on file  Social Connections: Not on file  Intimate Partner Violence: Not on file    Family History:   Family History  Problem Relation Age of Onset   Breast cancer Mother    Diabetes Maternal Uncle    Diabetes Maternal Grandmother    Heart disease Maternal Grandmother      ROS:  Please see the history of present illness.  Review of Systems  Constitutional:  Positive for malaise/fatigue. Negative for fever.  HENT:  Negative for congestion.   Respiratory:  Positive for shortness of breath. Negative for cough.   Cardiovascular:  Positive for palpitations and leg swelling. Negative for chest pain, orthopnea and PND.  Gastrointestinal:  Negative for blood in stool and melena.  Genitourinary:  Negative for hematuria.  Musculoskeletal:  Negative for falls and myalgias.  Neurological:  Positive for dizziness. Negative for loss of  consciousness.  Endo/Heme/Allergies:  Does not bruise/bleed easily.  Psychiatric/Behavioral:  Negative for substance abuse.    Physical Exam/Data:   Vitals:   05/23/21 0053 05/23/21 0222 05/23/21 0418 05/23/21 0630  BP: 115/63 106/73 126/84 122/82  Pulse: (!) 102 (!) 104 (!) 105 100  Resp: 18 18 18 20   Temp: 99.7 F (37.6 C) 99.3 F (37.4 C) 98.6 F (37 C) 98.8 F (37.1 C)  TempSrc: Oral Oral Oral Oral  SpO2: 98% 100% 98% 98%  Weight:      Height:        Intake/Output Summary (Last 24 hours) at 05/23/2021 0910 Last data filed at 05/22/2021 2053 Gross per 24 hour  Intake 1873.25 ml  Output --  Net 1873.25 ml   Last 3 Weights 05/21/2021 04/29/2021 04/25/2021  Weight (lbs) 279 lb 287 lb 288 lb 6.4 oz  Weight (kg) 126.554 kg 130.182 kg 130.817 kg     Body mass index is 47.89 kg/m.  General: 45 y.o. African-American female resting comfortably in no acute distress. HEENT: Normocephalic and atraumatic. Sclera clear. Neck: Supple. No JVD. Heart: Tachycardic with regular rhythm. Distinct S1 and S2. No murmurs, gallops, or rubs. Radial pulses 2+ and equal bilaterally. Lungs: No increased work of breathing. Clear to ausculation bilaterally. No wheezes, rhonchi, or rales.  Abdomen: Soft, non-distended, and non-tender to palpation. Bowel sounds present. MSK: Normal strength and tone for age. Extremities: Trace lower extremity edema bilaterally.    Skin: Warm and dry. Neuro: Alert and oriented x3. No focal deficits. Psych: Normal affect. Responds appropriately.  EKG:  The EKG was personally reviewed and demonstrates: Sinus tachycardia, rate 128 bpm, with mild T wave abnormalities in V5-V6 Telemetry:  Telemetry was personally reviewed and demonstrates:  Sinus tachycardia with rates in the low 100s to 110s.   Relevant CV Studies:  Echocardiogram 05/22/2021: Impressions: 1. Left ventricular ejection fraction, by estimation, is 50%. The left  ventricle has mildly decreased function.  The left ventricle demonstrates  global hypokinesis. Left ventricular diastolic parameters are consistent  with Grade I diastolic dysfunction  (impaired relaxation). The average left ventricular global longitudinal  strain is -15.7 %. The global longitudinal strain is abnormal.   2. Right ventricular systolic function is normal. The right ventricular  size is normal. There is normal pulmonary artery systolic pressure. The  estimated right ventricular systolic pressure is 61.6 mmHg.   3. The mitral valve  is normal in structure. Trivial mitral valve  regurgitation. No evidence of mitral stenosis.   4. The aortic valve is tricuspid. Aortic valve regurgitation is not  visualized. No aortic stenosis is present.   5. The inferior vena cava is normal in size with greater than 50%  respiratory variability, suggesting right atrial pressure of 3 mmHg.   Laboratory Data:  High Sensitivity Troponin:   Recent Labs  Lab 05/21/21 1026 05/21/21 1244  TROPONINIHS 8 9     Chemistry Recent Labs  Lab 05/21/21 1026 05/21/21 1244 05/22/21 0535 05/23/21 0545  NA 133*  --  136 135  K 3.2*  --  4.0 3.8  CL 102  --  106 101  CO2 24  --  24 26  GLUCOSE 136*  --  101* 106*  BUN 6  --  6 6  CREATININE 0.64  --  0.55 0.67  CALCIUM 8.8*  --  8.6* 9.1  MG  --  1.7  --   --   GFRNONAA >60  --  >60 >60  ANIONGAP 7  --  6 8    Recent Labs  Lab 05/21/21 1026 05/22/21 0535  PROT 7.9 6.9  ALBUMIN 3.4* 3.0*  AST 31 24  ALT 17 16  ALKPHOS 54 42  BILITOT 0.5 0.6   Lipids No results for input(s): CHOL, TRIG, HDL, LABVLDL, LDLCALC, CHOLHDL in the last 168 hours.  Hematology Recent Labs  Lab 05/21/21 1026 05/22/21 0535 05/23/21 0545  WBC 7.1 6.1 7.1  RBC 3.22* 2.87* 3.57*  HGB 9.8* 8.7* 10.9*  HCT 31.0* 27.7* 33.7*  MCV 96.3 96.5 94.4  MCH 30.4 30.3 30.5  MCHC 31.6 31.4 32.3  RDW 16.2* 16.4* 16.0*  PLT 417* 359 408*   Thyroid  Recent Labs  Lab 05/21/21 1358 05/22/21 1154  TSH 0.172*   --   FREET4  --  0.60*    BNP Recent Labs  Lab 05/23/21 0545  BNP 35.8    DDimer No results for input(s): DDIMER in the last 168 hours.   Radiology/Studies:  DG Chest 2 View  Result Date: 05/21/2021 CLINICAL DATA:  Shortness of breath. Additional history provided: History of breast cancer, patient reports recently completing chemotherapy. EXAM: CHEST - 2 VIEW COMPARISON:  Chest radiograph 11/21/2020. FINDINGS: A right chest infusion port catheter is present with tip projecting at the level of the lower SVC. Heart size within normal limits. No appreciable airspace consolidation or pulmonary edema. No evidence of pleural effusion or pneumothorax. No acute bony abnormality identified. Mild thoracolumbar dextrocurvature. Surgical clips within the upper abdomen. IMPRESSION: No evidence of acute cardiopulmonary abnormality. Electronically Signed   By: Kellie Simmering D.O.   On: 05/21/2021 10:55   CT Angio Chest PE W/Cm &/Or Wo Cm  Result Date: 05/21/2021 CLINICAL DATA:  Chest pain and shortness of breath. Pulmonary embolism suspected, high probability. Diagnosed with right breast cancer 7 months ago. EXAM: CT ANGIOGRAPHY CHEST WITH CONTRAST TECHNIQUE: Multidetector CT imaging of the chest was performed using the standard protocol during bolus administration of intravenous contrast. Multiplanar CT image reconstructions and MIPs were obtained to evaluate the vascular anatomy. CONTRAST:  8mL OMNIPAQUE IOHEXOL 350 MG/ML SOLN COMPARISON:  CT chest 11/14/2020. FINDINGS: Cardiovascular: The pulmonary arteries are well opacified with contrast to the level of the subsegmental branches. There is no evidence of acute pulmonary embolism. No acute systemic arterial abnormalities are identified. A right IJ Port-A-Cath extends into the superior aspect of the right atrium. The heart size  is normal. There is no pericardial effusion. Mediastinum/Nodes: The previously demonstrated axillary lymphadenopathy has resolved.  There are no enlarged mediastinal, hilar or internal mammary lymph nodes. The thyroid gland, trachea and esophagus demonstrate no significant findings. Lungs/Pleura: There is no pleural effusion. The lungs appear clear. Upper abdomen: The visualized upper abdomen appears stable without significant findings. Musculoskeletal/Chest wall: There is no chest wall mass or suspicious osseous finding. No discrete breast masses are identified. Review of the MIP images confirms the above findings. IMPRESSION: 1. No evidence of acute pulmonary embolism or other acute chest process. 2. Previously demonstrated axillary lymphadenopathy has resolved. No evidence of thoracic metastatic disease. Electronically Signed   By: Richardean Sale M.D.   On: 05/21/2021 14:07   ECHOCARDIOGRAM COMPLETE  Result Date: 05/22/2021    ECHOCARDIOGRAM REPORT   Patient Name:   [name] Date of Exam: 05/22/2021 Medical Rec #:  [id number]   Height:       64.0 in Accession #:    [id number]  Weight:       279.0 lb Date of Birth:  1976/01/02    BSA:          2.254 m Patient Age:    73 years    BP:           131/88 mmHg Patient Gender: F           HR:           97 bpm. Exam Location:  Inpatient Procedure: 2D Echo, Cardiac Doppler, Color Doppler and Strain Analysis Indications:     Murmur  History:         Patient has prior history of Echocardiogram examinations, most                  recent 11/19/2020. Signs/Symptoms:Dyspnea; Risk Factors:Chemo.  Sonographer:     Glo Herring Referring Phys:  [id number] [place] Diagnosing Phys: Franki Monte IMPRESSIONS  1. Left ventricular ejection fraction, by estimation, is 50%. The left ventricle has mildly decreased function. The left ventricle demonstrates global hypokinesis. Left ventricular diastolic parameters are consistent with Grade I diastolic dysfunction (impaired relaxation). The average left ventricular global longitudinal strain is -15.7 %. The global longitudinal strain is abnormal.  2. Right  ventricular systolic function is normal. The right ventricular size is normal. There is normal pulmonary artery systolic pressure. The estimated right ventricular systolic pressure is 74.8 mmHg.  3. The mitral valve is normal in structure. Trivial mitral valve regurgitation. No evidence of mitral stenosis.  4. The aortic valve is tricuspid. Aortic valve regurgitation is not visualized. No aortic stenosis is present.  5. The inferior vena cava is normal in size with greater than 50% respiratory variability, suggesting right atrial pressure of 3 mmHg. FINDINGS  Left Ventricle: Left ventricular ejection fraction, by estimation, is 50%. The left ventricle has mildly decreased function. The left ventricle demonstrates global hypokinesis. The average left ventricular global longitudinal strain is -15.7 %. The global longitudinal strain is abnormal. The left ventricular internal cavity size was normal in size. There is no left ventricular hypertrophy. Left ventricular diastolic parameters are consistent with Grade I diastolic dysfunction (impaired relaxation). Right Ventricle: The right ventricular size is normal. No increase in right ventricular wall thickness. Right ventricular systolic function is normal. There is normal pulmonary artery systolic pressure. The tricuspid regurgitant velocity is 2.27 m/s, and  with an assumed right atrial pressure of 3 mmHg, the estimated right ventricular systolic pressure is 27.0 mmHg. Left Atrium:  Left atrial size was normal in size. Right Atrium: Right atrial size was normal in size. Pericardium: There is no evidence of pericardial effusion. Mitral Valve: The mitral valve is normal in structure. Trivial mitral valve regurgitation. No evidence of mitral valve stenosis. Tricuspid Valve: The tricuspid valve is normal in structure. Tricuspid valve regurgitation is trivial. Aortic Valve: The aortic valve is tricuspid. Aortic valve regurgitation is not visualized. No aortic stenosis is  present. Aortic valve mean gradient measures 4.0 mmHg. Aortic valve peak gradient measures 8.0 mmHg. Aortic valve area, by VTI measures 1.69 cm. Pulmonic Valve: The pulmonic valve was normal in structure. Pulmonic valve regurgitation is not visualized. Aorta: The aortic root is normal in size and structure. Venous: The inferior vena cava is normal in size with greater than 50% respiratory variability, suggesting right atrial pressure of 3 mmHg. IAS/Shunts: No atrial level shunt detected by color flow Doppler.  LEFT VENTRICLE PLAX 2D LVIDd:         5.30 cm      Diastology LVIDs:         3.90 cm      LV e' medial:    7.94 cm/s LV PW:         1.10 cm      LV E/e' medial:  10.3 LV IVS:        1.10 cm      LV e' lateral:   11.30 cm/s LVOT diam:     2.00 cm      LV E/e' lateral: 7.2 LV SV:         49 LV SV Index:   22           2D Longitudinal Strain LVOT Area:     3.14 cm     2D Strain GLS Avg:     -15.7 %  LV Volumes (MOD) LV vol d, MOD A2C: 124.0 ml LV vol d, MOD A4C: 131.0 ml LV vol s, MOD A2C: 70.7 ml LV vol s, MOD A4C: 68.9 ml LV SV MOD A2C:     53.3 ml LV SV MOD A4C:     131.0 ml LV SV MOD BP:      58.9 ml RIGHT VENTRICLE             IVC RV Basal diam:  3.50 cm     IVC diam: 1.50 cm RV S prime:     13.40 cm/s LEFT ATRIUM           Index        RIGHT ATRIUM           Index LA diam:      3.20 cm 1.42 cm/m   RA Area:     13.10 cm LA Vol (A2C): 44.9 ml 19.92 ml/m  RA Volume:   28.30 ml  12.56 ml/m LA Vol (A4C): 45.0 ml 19.97 ml/m  AORTIC VALVE AV Area (Vmax):    1.70 cm AV Area (Vmean):   1.91 cm AV Area (VTI):     1.69 cm AV Vmax:           141.00 cm/s AV Vmean:          97.600 cm/s AV VTI:            0.288 m AV Peak Grad:      8.0 mmHg AV Mean Grad:      4.0 mmHg LVOT Vmax:         76.40 cm/s LVOT Vmean:  59.300 cm/s LVOT VTI:          0.155 m LVOT/AV VTI ratio: 0.54  AORTA Ao Root diam: 3.20 cm Ao Asc diam:  2.90 cm MITRAL VALVE                TRICUSPID VALVE MV Area (PHT): 7.29 cm     TR Peak grad:    20.6 mmHg MV Decel Time: 104 msec     TR Vmax:        227.00 cm/s MV E velocity: 81.40 cm/s MV A velocity: 102.00 cm/s  SHUNTS MV E/A ratio:  0.80         Systemic VTI:  0.16 m                             Systemic Diam: 2.00 cm Dalton McleanMD Electronically signed by Franki Monte Signature Date/Time: 05/22/2021/6:24:32 PM    Final (Updated)      Assessment and Plan:   Dyspnea on Exertion New Cardiomyopathy - Patient presented with rest of dyspnea on exertion over the last 2 weeks.  No other signs of CHF.  No chest pain. - High-sensitivity troponin negative x2.  - Chest x-ray showed no acute findings.  - Chest CTA negative for PE. - Echo showed 50% with global hypokinesis, grade 1 diastolic dysfunction, and abnormal global longitudinal strain of -15.7. EF was 70-75% and global longitudinal strain was -16.6 in 11/2020 prior to chemo.  - She received 1 dose of IV Lasix yesterday as well as 1 unit or PRBC for anemia.  No documented urinary output but patient reports good urine output with Lasix.  She has not been up to ambulate since she has been here so unclear if dyspnea has improved with above measures. - BNP 35.8 but may be falsely low due to obesity.  - Spoke with Dr. Cruzita Lederer - he was planning on given another dose of Lasix this morning. She does not seem very volume overloaded on exam but somewhat hard to tell due to obesity. I think it is OK to give another dose to see if that helps any. - Currently on Toprol-XL but notes significant fatigue since starting this. Will switch to Coreg 6.25mg  twice daily. If BP tolerate this, may consider adding ARB. - Suspect cardiomyopathy is due to chemotherapy drugs, specifically Adriamycin and Taxol.  She states she has completed chemo.  Decision was made to not come finish the last 3 weeks of Taxol due to neuropathy.  Will discuss further recommendations with MD.  Sinus Tachycardia - Rates initially in the 120s on admission. Currently in the low 100s to  110s.  - Potassium 3.2 on admission. Repleted and improve 3.8.  - Magnesium 1.7.  - TSH low at 0.172. Free TR slightly low at 0.60. Free T3 normal.  - On Toprol-XL at home (25mg  in the morning and 50mg  in the evening). She notes significant fatigue with this. Will switch to Coreg.    Risk Assessment/Risk Scores:    New York Heart Association (NYHA) Functional Class NYHA Class II-III   For questions or updates, please contact [facility] Please consult [email or web address] for contact info under    Signed, Darreld Mclean, PA-C  05/23/2021 9:10 AM

## 2021-05-23 NOTE — Progress Notes (Signed)
Mobility Specialist - Progress Note    05/23/21 1205  Oxygen Therapy  SpO2 94 %  O2 Device Room Air  Mobility  Activity Ambulated in hall  Level of Assistance Contact guard assist, steadying assist  Assistive Device None  Distance Ambulated (ft) 120 ft  Mobility Ambulated with assistance in hallway  Mobility Response Tolerated well  Mobility performed by Mobility specialist  $Mobility charge 1 Mobility    During mobility: 141 HR, 94%  SpO2 Post-mobility: 134 HR, 97% SPO2  Upon entry pt was up and returning from bathroom. No AD required when ambulating 120 ft in hallway, however pt reported feeling SOB and dizzy. Pt required 1 standing rest break to practice pursed breathing, HR was monitored during this time and recorded above. Once in room, pt reported feeling warm and requested to rest in recliner. Pt states she is not as baseline with HR or O2 sats and is experiencing more "dizzy spells". Pt left with call bell at side and was given a cold wash cloth to help her cool down. RN notified of session.   [place] Specialist Acute [facility] Services Phone: [phone] 05/23/21, 12:10 PM

## 2021-05-23 NOTE — Progress Notes (Signed)
The patient is injury-free, afebrile, alert, and oriented X 3. Pt's tachycardia is improving. She experiences tachypnea and tachycardia with exertion. The rest of vital signs were within the baseline during this shift.  Pt denies chest pain, SOB, nausea, vomiting, dizziness, signs or symptoms of bleeding or infection or acute changes during this shift. We will continue to monitor and work toward achieving the care plan goals

## 2021-05-23 NOTE — Progress Notes (Signed)
   05/23/21 1427  Assess: MEWS Score  Temp 98.7 F (37.1 C)  BP (!) 133/103  Pulse Rate (!) 122  Resp 20  SpO2 98 %  O2 Device Room Air  Assess: MEWS Score  MEWS Temp 0  MEWS Systolic 0  MEWS Pulse 2  MEWS RR 0  MEWS LOC 0  MEWS Score 2  MEWS Score Color Yellow  Assess: if the MEWS score is Yellow or Red  Were vital signs taken at a resting state? Yes  Focused Assessment Change from prior assessment (see assessment flowsheet)  Does the patient meet 2 or more of the SIRS criteria? Yes  Does the patient have a confirmed or suspected source of infection? No  MEWS guidelines implemented *See Row Information* Yes  Treat  MEWS Interventions Escalated (See documentation below)  Pain Scale 0-10  Pain Score 0  Take Vital Signs  Increase Vital Sign Frequency  Yellow: Q 2hr X 2 then Q 4hr X 2, if remains yellow, continue Q 4hrs  Escalate  MEWS: Escalate Yellow: discuss with charge nurse/RN and consider discussing with provider and RRT  Notify: Charge Nurse/RN  Name of Charge Nurse/RN Notified Advertising copywriter  Date Charge Nurse/RN Notified 05/23/21  Time Charge Nurse/RN Notified 1434  Notify: Provider  Provider Name/Title Dr. Cruzita Lederer and cardiologist  Date Provider Notified 05/23/21  Time Provider Notified 1435  Notification Type Page  Notification Reason Change in status  Provider response No new orders  Document  Patient Outcome Other (Comment)  Progress note created (see row info) Yes  Assess: SIRS CRITERIA  SIRS Temperature  0  SIRS Pulse 1  SIRS Respirations  0  SIRS WBC 0  SIRS Score Sum  1    Patient in yellow mews due to HR. Charge nurse made aware. Both MD and cardiologist notified. Awaiting new orders.

## 2021-05-23 NOTE — Plan of Care (Signed)

## 2021-05-24 ENCOUNTER — Inpatient Hospital Stay (HOSPITAL_COMMUNITY): Payer: No Typology Code available for payment source

## 2021-05-24 DIAGNOSIS — R0609 Other forms of dyspnea: Secondary | ICD-10-CM | POA: Diagnosis not present

## 2021-05-24 DIAGNOSIS — D6481 Anemia due to antineoplastic chemotherapy: Secondary | ICD-10-CM | POA: Diagnosis not present

## 2021-05-24 DIAGNOSIS — T451X5A Adverse effect of antineoplastic and immunosuppressive drugs, initial encounter: Secondary | ICD-10-CM | POA: Diagnosis not present

## 2021-05-24 LAB — BASIC METABOLIC PANEL
Anion gap: 9 (ref 5–15)
BUN: 16 mg/dL (ref 6–20)
CO2: 25 mmol/L (ref 22–32)
Calcium: 9.2 mg/dL (ref 8.9–10.3)
Chloride: 103 mmol/L (ref 98–111)
Creatinine, Ser: 0.98 mg/dL (ref 0.44–1.00)
GFR, Estimated: >60 mL/min (ref >60)
Glucose, Bld: 126 mg/dL — ABNORMAL HIGH (ref 70–99)
Potassium: 3.2 mmol/L — ABNORMAL LOW (ref 3.5–5.1)
Sodium: 137 mmol/L (ref 135–145)

## 2021-05-24 LAB — CBC
HCT: 33.5 % — ABNORMAL LOW (ref 36.0–46.0)
Hemoglobin: 10.9 g/dL — ABNORMAL LOW (ref 12.0–15.0)
MCH: 30.6 pg (ref 26.0–34.0)
MCHC: 32.5 g/dL (ref 30.0–36.0)
MCV: 94.1 fL (ref 80.0–100.0)
Platelets: 389 10*3/uL (ref 150–400)
RBC: 3.56 MIL/uL — ABNORMAL LOW (ref 3.87–5.11)
RDW: 15.9 % — ABNORMAL HIGH (ref 11.5–15.5)
WBC: 6.5 10*3/uL (ref 4.0–10.5)
nRBC: 0 % (ref 0.0–0.2)

## 2021-05-24 LAB — ACTH: C206 ACTH: <1.5 pg/mL — ABNORMAL LOW (ref 7.2–63.3)

## 2021-05-24 MED ORDER — HYDROCORTISONE 10 MG PO TABS: ORAL_TABLET | ORAL | 0 refills | Status: AC

## 2021-05-24 MED ORDER — GADOBUTROL 1 MMOL/ML IV SOLN
10.0000 mL | Freq: Once | INTRAVENOUS | Status: AC | PRN
  Administered 2021-05-24: 10 mL via INTRAVENOUS

## 2021-05-24 MED ORDER — BISOPROLOL FUMARATE 10 MG PO TABS: 10.0000 mg | ORAL_TABLET | Freq: Every day | ORAL | 0 refills | Status: DC

## 2021-05-24 MED ORDER — HYDROCORTISONE 5 MG PO TABS: ORAL_TABLET | ORAL | 1 refills | Status: DC

## 2021-05-24 MED ORDER — HEPARIN SOD (PORK) LOCK FLUSH 100 UNIT/ML IV SOLN
500.0000 [IU] | INTRAVENOUS | Status: AC | PRN
  Administered 2021-05-24: 500 [IU]
  Filled 2021-05-24: qty 5

## 2021-05-24 NOTE — Progress Notes (Signed)
   05/24/21 1100  Mobility  Activity Off unit   Pt at MRI per NT. Will check back as schedule permits.  [facility] Office: [phone]

## 2021-05-24 NOTE — Progress Notes (Signed)
   05/23/21 2022  Assess: MEWS Score  Temp 98 F (36.7 C)  BP 105/71  Pulse Rate 99  Resp 20  Level of Consciousness Alert  SpO2 100 %  O2 Device Room Air  Assess: if the MEWS score is Yellow or Red  Were vital signs taken at a resting state? Yes  Focused Assessment No change from prior assessment  Does the patient meet 2 or more of the SIRS criteria? Yes  Does the patient have a confirmed or suspected source of infection? No  MEWS guidelines implemented *See Row Information* No, previously yellow, continue vital signs every 4 hours  Treat  MEWS Interventions Other (Comment)  Pain Scale 0-10  Pain Score 0  Document  Patient Outcome Other (Comment) (patient remains stable)  Assess: SIRS CRITERIA  SIRS Temperature  0  SIRS Pulse 1  SIRS Respirations  0  SIRS WBC 0  SIRS Score Sum  1

## 2021-05-24 NOTE — Discharge Instructions (Addendum)
You are diagnosed with adrenal insufficiency in the hospital.  Please take hydrocortisone (Cortef) at 20 mg in the morning and 10 mg in the afternoon for the next 7 days.  After he finished his regimen, change hydrocortisone (Cortef) to 10 mg in the morning and 5 mg in the afternoon until you see your endocrinologist.    Heart Failure Education: Weigh yourself EVERY morning after you go to the bathroom but before you eat or drink anything. Write this number down in a weight log/diary. If you gain 3 pounds overnight or 5 pounds in a week, you can take a dose of Lasix 40mg  daily. You can also take a dose of Lasix once daily as needed for worsening lower extremity edema or shortness of breath. Take your medicines as prescribed. If you have concerns about your medications, please call us before you stop taking them.  Eat low salt foods--Limit salt (sodium) to 2000 mg per day. This will help prevent your body from holding onto fluid. Read food labels as many processed foods have a lot of sodium, especially canned goods and prepackaged meats. If you would like some assistance choosing low sodium foods, we would be happy to set you up with a nutritionist. Limit all fluids for the day to less than 2 liters (64 ounces). Fluid includes all drinks, coffee, juice, ice chips, soup, jello, and all other liquids. Stay as active as you can everyday. Staying active will give you more energy and make your muscles stronger. Start with 5 minutes at a time and work your way up to 30 minutes a day. Break up your activities--do some in the morning and some in the afternoon. Start with 3 days per week and work your way up to 5 days as you can.  If you have chest pain, feel short of breath, dizzy, or lightheaded, STOP. If you don't feel better after a short rest, call 911. If you do feel better, call the office to let us know you have symptoms with exercise.

## 2021-05-24 NOTE — Discharge Summary (Signed)
Physician Discharge Summary  Amber White IRC:[id number] DOB: 1976/06/26 DOA: 05/21/2021  PCP: [name], MD  Admit date: 05/21/2021 Discharge date: 05/24/2021  Admitted From: home Disposition:  home  Recommendations for Outpatient Follow-up:  Follow up with PCP in 1-2 weeks Follow up with Endocrinology in 3-4 weeks  Home Health: none Equipment/Devices: none  Discharge Condition: stable3 CODE STATUS: Full code Diet recommendation: heart healthy  HPI: Per admitting MD, Amber White is a 45 y.o. female with medical history significant of morbid obesity, breast cancer who is coming to the emergency department due to dyspnea on exertion that has gotten progressively worse over the past 2 weeks.  She has palpitations and lightheadedness, but she denied chest pain, diaphoresis, lower extremity edema, PND or orthopnea.  No fever, chills, sore throat, rhinorrhea, wheezing or hemoptysis.  No abdominal pain, nausea, emesis, diarrhea, constipation, melena or hematochezia.  No dysuria, frequency or hematuria.  No polyuria, polydipsia, polyphagia or blurred vision.  Hospital Course / Discharge diagnoses: Principal Problem Dyspnea on exertion, weakness, fatigue due to adrenal insufficiency-patient was admitted to the hospital with weakness, severe dyspnea on exertion.  She underwent a CT angiogram on admission which is negative for PE or any other pulmonary processes.  A 2D echo was done which showed low normal EF at 50% and LV hypokinesis, which was new from the prior echocardiogram.  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  She was also found to be anemic in the setting of recent chemotherapy and was transfused unit of packed red blood cells.  The patient is on Keytruda, and a cortisol / ACTH were checked, and both came back undetectable, suggesting a central cause.  She was placed on steroids with rapid improvement in her symptoms, she is feeling much improved,  able to ambulate the hallway without any further issues.  She will be placed on Cortef 20/10 over the next 7 days then transition to 10/5 and she was asked to follow-up with endocrinology as an outpatient.    Active Problems Breast cancer-outpatient management, discussed with Dr. Lindi Adie over the phone on 11/9.  CT scan this admission shows that axillary lymphadenopathy has resolved and there is no evidence of thoracic metastatic disease Symptomatic anemia-likely in the setting of chemotherapy, hemoglobin was 8  but given profound DOE she was transfused unit of packed red blood cells.  Peripheral neuropathy-Taxol was discontinued Sinus tachycardia-appreciate cardiology input, she was changed from metoprolol to bisoprolol. Abnormal, decreased TSH-T3, T4 unremarkable.  Sepsis ruled out   Discharge Instructions   Allergies as of 05/24/2021       Reactions   Paclitaxel Nausea Only, Other (See Comments)   Complained of chest burning and nausea. Resolved with famotidine, methylprednisolone, and diphenhydramine. Resumed paclitaxel and completed without further incident.    Penicillins Hives        Medication List     STOP taking these medications    fluconazole 100 MG tablet Commonly known as: DIFLUCAN   metoprolol succinate 50 MG 24 hr tablet Commonly known as: Toprol XL   metroNIDAZOLE 500 MG tablet Commonly known as: FLAGYL   OLANZapine 10 MG tablet Commonly known as: ZYPREXA       TAKE these medications    Adderall XR 30 MG 24 hr capsule Generic drug: amphetamine-dextroamphetamine Take 30 mg by mouth every morning.   bisoprolol 10 MG tablet Commonly known as: ZEBETA Take 1 tablet (10 mg total) by mouth daily. Start taking on: May 25, 2021  gabapentin 100 MG capsule Commonly known as: NEURONTIN Take 3 capsules (300 mg total) by mouth at bedtime.   hydrocortisone 10 MG tablet Commonly known as: Cortef Take 2 tablets (20 mg total) by mouth in the morning  AND 1 tablet (10 mg total) daily before supper. Do all this for 7 days.   hydrocortisone 5 MG tablet Commonly known as: Cortef Take 2 tablets (10 mg total) by mouth in the morning AND 1 tablet (5 mg total) daily before supper. Start taking on: June 01, 2021   omeprazole 40 MG capsule Commonly known as: PRILOSEC TAKE 1 CAPSULE (40 MG TOTAL) BY MOUTH DAILY. What changed:  when to take this reasons to take this   OVER THE COUNTER MEDICATION Take 1 tablet by mouth daily. Amberen        Follow-up Information     Darreld Mclean, PA-C Follow up.   Specialties: Physician Assistant, Cardiology Why: Hospital follow-up with Cardiology scheduled for 06/27/2021 at 10:05am. Please arrive 15 minutes early for check-in. If this date/time does not work for you, please call our office to reschedule. Contact information: [address] [place] [address] [phone]         [facility]. Call in 3 day(s).   Specialty: Internal Medicine Why: Call to establish care either with Dr Cruzita Lederer or with Dr Kelton Pillar, whoever can see you sooner Contact information: [address] [place] [address] [phone]                Consultations: Cardiology  Oncology   Procedures/Studies:  DG Chest 2 View  Result Date: 05/21/2021 CLINICAL DATA:  Shortness of breath. Additional history provided: History of breast cancer, patient reports recently completing chemotherapy. EXAM: CHEST - 2 VIEW COMPARISON:  Chest radiograph 11/21/2020. FINDINGS: A right chest infusion port catheter is present with tip projecting at the level of the lower SVC. Heart size within normal limits. No appreciable airspace consolidation or pulmonary edema. No evidence of pleural effusion or pneumothorax. No acute bony abnormality identified. Mild thoracolumbar dextrocurvature. Surgical clips within the upper abdomen. IMPRESSION: No evidence of acute  cardiopulmonary abnormality. Electronically Signed   By: Kellie Simmering D.O.   On: 05/21/2021 10:55   CT Angio Chest PE W/Cm &/Or Wo Cm  Result Date: 05/21/2021 CLINICAL DATA:  Chest pain and shortness of breath. Pulmonary embolism suspected, high probability. Diagnosed with right breast cancer 7 months ago. EXAM: CT ANGIOGRAPHY CHEST WITH CONTRAST TECHNIQUE: Multidetector CT imaging of the chest was performed using the standard protocol during bolus administration of intravenous contrast. Multiplanar CT image reconstructions and MIPs were obtained to evaluate the vascular anatomy. CONTRAST:  7mL OMNIPAQUE IOHEXOL 350 MG/ML SOLN COMPARISON:  CT chest 11/14/2020. FINDINGS: Cardiovascular: The pulmonary arteries are well opacified with contrast to the level of the subsegmental branches. There is no evidence of acute pulmonary embolism. No acute systemic arterial abnormalities are identified. A right IJ Port-A-Cath extends into the superior aspect of the right atrium. The heart size is normal. There is no pericardial effusion. Mediastinum/Nodes: The previously demonstrated axillary lymphadenopathy has resolved. There are no enlarged mediastinal, hilar or internal mammary lymph nodes. The thyroid gland, trachea and esophagus demonstrate no significant findings. Lungs/Pleura: There is no pleural effusion. The lungs appear clear. Upper abdomen: The visualized upper abdomen appears stable without significant findings. Musculoskeletal/Chest wall: There is no chest wall mass or suspicious osseous finding. No discrete breast masses are identified. Review of the MIP images confirms  the above findings. IMPRESSION: 1. No evidence of acute pulmonary embolism or other acute chest process. 2. Previously demonstrated axillary lymphadenopathy has resolved. No evidence of thoracic metastatic disease. Electronically Signed   By: Richardean Sale M.D.   On: 05/21/2021 14:07   MR BRAIN W WO CONTRAST  Result Date:  05/24/2021 CLINICAL DATA:  Dizziness, nonspecific, history of breast cancer, pituitary protocol per ordering provider EXAM: MRI HEAD WITHOUT AND WITH CONTRAST TECHNIQUE: Multiplanar, multiecho pulse sequences of the brain and surrounding structures were obtained without and with intravenous contrast. CONTRAST:  22mL GADAVIST GADOBUTROL 1 MMOL/ML IV SOLN COMPARISON:  None. FINDINGS: Evaluation is somewhat limited by motion artifact several sequences. Brain: The pituitary gland and sella are normal in appearance on precontrast imaging, with a normal pituitary bright spot. Postcontrast imaging is somewhat limited by motion; however, within this limitation no focal mass is seen within the sella or pituitary gland. The infundibulum is midline and normal in appearance. The optic chiasm is unremarkable. No acute infarction, hemorrhage, mass, mass effect, or midline shift. No abnormal enhancement. Insert normal note is made of a small arachnoid cyst at the medial aspect of the left hippocampus, without signal Abner adjacent parenchyma. No hydrocephalus or extra-axial collection Vascular: Normal flow voids. Skull and upper cervical spine: Small T1 and T2 hyperintense lesion in the right parietal calvarium, consistent with a benign hemangioma. Otherwise normal marrow signal. Sinuses/Orbits: Negative. Other: The mastoids are well aerated. IMPRESSION: 1. Evaluation of the pituitary is somewhat limited by motion; however, within this limitation, no focal masses seen within the sella or pituitary gland. 2. No acute intracranial process. Electronically Signed   By: Merilyn Baba M.D.   On: 05/24/2021 12:25   MR BREAST BILATERAL W WO CONTRAST INC CAD  Result Date: 05/17/2021 CLINICAL DATA:  Follow-up known breast cancer. Assess treatment response. LABS:  None EXAM: BILATERAL BREAST MRI WITH AND WITHOUT CONTRAST TECHNIQUE: Multiplanar, multisequence MR images of both breasts were obtained prior to and following the intravenous  administration of 10 ml of Gadavist Three-dimensional MR images were rendered by post-processing of the original MR data on an independent workstation. The three-dimensional MR images were interpreted, and findings are reported in the following complete MRI report for this study. Three dimensional images were evaluated at the independent interpreting workstation using the DynaCAD thin client. COMPARISON:  Nov 11, 2020 breast MRI FINDINGS: Breast composition: c. Heterogeneous fibroglandular tissue. Background parenchymal enhancement: Mild Right breast: The diffuse abnormal masslike and non mass enhancement throughout the right breast measuring 10.2 x 4.9 x 5.0 cm on the previous study has almost completely resolved in the interval. There is a single focus of enhancement remaining measuring 4.5 mm on series 6, image 73. No other abnormal enhancement is identified in the right breast. The enhancement of the nipple and adjacent skin has resolved. The enhancement in the skin of the medial right breast has resolved. Skin thickening has improved but remains. Left breast: No suspicious masses or non mass enhancement are seen in the left breast. There is mild skin based enhancement in the inferior left breast seen on series 6, image 126 best seen on DynaCAD. The patient has 5 left intramammary lymph nodes. The left intramammary lymph node on series 6, image 72 is larger in the interval. The intramammary lymph node on series 6, image 88 is larger in the interval. The other intramammary nodes are stable. Lymph nodes: Left intramammary lymph nodes as above. No axillary adenopathy identified bilaterally. The abnormal nodes on  the right have returned to normal size. Ancillary findings:  None. IMPRESSION: 1. Near complete response on the right. The 10.2 x 4.9 x 5.0 cm masslike and non masslike enhancement in the right breast on the previous study has almost completely resolved. A single remaining focus of enhancement measuring 4.5  mm remains on series 6, image 73. The finding is not specific but a tiny amount of residual malignancy in this region is not excluded on this study. 2. Mildly enhancing skin thickening in the inferior left breast, likely inflammatory or infectious. Recommend clinical correlation. 3. Two intramammary lymph nodes on the left are larger in the interval. Given the skin based process, reactive nodes are favored. This could be further evaluated with ultrasound and close follow-up. RECOMMENDATION: Recommend continued surgical and oncologic follow up. Recommend clinical correlation of the inferior left breast as there appears to be a skin based lesion in this region, likely inflammatory infectious. Short-term follow-up of the left intramammary nodes is recommended to ensure return to normal. BI-RADS CATEGORY  6: Known biopsy-proven malignancy. Electronically Signed   By: Dorise Bullion III M.D.   On: 05/17/2021 17:49  ECHOCARDIOGRAM COMPLETE  Result Date: 05/22/2021    ECHOCARDIOGRAM REPORT   Patient Name:   Amber White Date of Exam: 05/22/2021 Medical Rec #:  [id number]   Height:       64.0 in Accession #:    [id number]  Weight:       279.0 lb Date of Birth:  1976-02-23    BSA:          2.254 m Patient Age:    89 years    BP:           131/88 mmHg Patient Gender: F           HR:           97 bpm. Exam Location:  Inpatient Procedure: 2D Echo, Cardiac Doppler, Color Doppler and Strain Analysis Indications:     Murmur  History:         Patient has prior history of Echocardiogram examinations, most                  recent 11/19/2020. Signs/Symptoms:Dyspnea; Risk Factors:Chemo.  Sonographer:     Glo Herring Referring Phys:  [id number] [place] Diagnosing Phys: Franki Monte IMPRESSIONS  1. Left ventricular ejection fraction, by estimation, is 50%. The left ventricle has mildly decreased function. The left ventricle demonstrates global hypokinesis. Left ventricular diastolic parameters are consistent with Grade I  diastolic dysfunction (impaired relaxation). The average left ventricular global longitudinal strain is -15.7 %. The global longitudinal strain is abnormal.  2. Right ventricular systolic function is normal. The right ventricular size is normal. There is normal pulmonary artery systolic pressure. The estimated right ventricular systolic pressure is 53.2 mmHg.  3. The mitral valve is normal in structure. Trivial mitral valve regurgitation. No evidence of mitral stenosis.  4. The aortic valve is tricuspid. Aortic valve regurgitation is not visualized. No aortic stenosis is present.  5. The inferior vena cava is normal in size with greater than 50% respiratory variability, suggesting right atrial pressure of 3 mmHg. FINDINGS  Left Ventricle: Left ventricular ejection fraction, by estimation, is 50%. The left ventricle has mildly decreased function. The left ventricle demonstrates global hypokinesis. The average left ventricular global longitudinal strain is -15.7 %. The global longitudinal strain is abnormal. The left ventricular internal cavity size was normal in size. There is no left ventricular hypertrophy.  Left ventricular diastolic parameters are consistent with Grade I diastolic dysfunction (impaired relaxation). Right Ventricle: The right ventricular size is normal. No increase in right ventricular wall thickness. Right ventricular systolic function is normal. There is normal pulmonary artery systolic pressure. The tricuspid regurgitant velocity is 2.27 m/s, and  with an assumed right atrial pressure of 3 mmHg, the estimated right ventricular systolic pressure is 15.4 mmHg. Left Atrium: Left atrial size was normal in size. Right Atrium: Right atrial size was normal in size. Pericardium: There is no evidence of pericardial effusion. Mitral Valve: The mitral valve is normal in structure. Trivial mitral valve regurgitation. No evidence of mitral valve stenosis. Tricuspid Valve: The tricuspid valve is normal in  structure. Tricuspid valve regurgitation is trivial. Aortic Valve: The aortic valve is tricuspid. Aortic valve regurgitation is not visualized. No aortic stenosis is present. Aortic valve mean gradient measures 4.0 mmHg. Aortic valve peak gradient measures 8.0 mmHg. Aortic valve area, by VTI measures 1.69 cm. Pulmonic Valve: The pulmonic valve was normal in structure. Pulmonic valve regurgitation is not visualized. Aorta: The aortic root is normal in size and structure. Venous: The inferior vena cava is normal in size with greater than 50% respiratory variability, suggesting right atrial pressure of 3 mmHg. IAS/Shunts: No atrial level shunt detected by color flow Doppler.  LEFT VENTRICLE PLAX 2D LVIDd:         5.30 cm      Diastology LVIDs:         3.90 cm      LV e' medial:    7.94 cm/s LV PW:         1.10 cm      LV E/e' medial:  10.3 LV IVS:        1.10 cm      LV e' lateral:   11.30 cm/s LVOT diam:     2.00 cm      LV E/e' lateral: 7.2 LV SV:         49 LV SV Index:   22           2D Longitudinal Strain LVOT Area:     3.14 cm     2D Strain GLS Avg:     -15.7 %  LV Volumes (MOD) LV vol d, MOD A2C: 124.0 ml LV vol d, MOD A4C: 131.0 ml LV vol s, MOD A2C: 70.7 ml LV vol s, MOD A4C: 68.9 ml LV SV MOD A2C:     53.3 ml LV SV MOD A4C:     131.0 ml LV SV MOD BP:      58.9 ml RIGHT VENTRICLE             IVC RV Basal diam:  3.50 cm     IVC diam: 1.50 cm RV S prime:     13.40 cm/s LEFT ATRIUM           Index        RIGHT ATRIUM           Index LA diam:      3.20 cm 1.42 cm/m   RA Area:     13.10 cm LA Vol (A2C): 44.9 ml 19.92 ml/m  RA Volume:   28.30 ml  12.56 ml/m LA Vol (A4C): 45.0 ml 19.97 ml/m  AORTIC VALVE AV Area (Vmax):    1.70 cm AV Area (Vmean):   1.91 cm AV Area (VTI):     1.69 cm AV Vmax:  141.00 cm/s AV Vmean:          97.600 cm/s AV VTI:            0.288 m AV Peak Grad:      8.0 mmHg AV Mean Grad:      4.0 mmHg LVOT Vmax:         76.40 cm/s LVOT Vmean:        59.300 cm/s LVOT VTI:           0.155 m LVOT/AV VTI ratio: 0.54  AORTA Ao Root diam: 3.20 cm Ao Asc diam:  2.90 cm MITRAL VALVE                TRICUSPID VALVE MV Area (PHT): 7.29 cm     TR Peak grad:   20.6 mmHg MV Decel Time: 104 msec     TR Vmax:        227.00 cm/s MV E velocity: 81.40 cm/s MV A velocity: 102.00 cm/s  SHUNTS MV E/A ratio:  0.80         Systemic VTI:  0.16 m                             Systemic Diam: 2.00 cm Dalton McleanMD Electronically signed by Franki Monte Signature Date/Time: 05/22/2021/6:24:32 PM    Final (Updated)      Subjective: - no chest pain, shortness of breath, no abdominal pain, nausea or vomiting.   Discharge Exam: BP 116/75 (BP Location: Right Wrist)   Pulse 96   Temp 99.3 F (37.4 C) (Oral)   Resp 20   Ht 5\' 4"  (1.626 m)   Wt 126.6 kg   SpO2 99%   BMI 47.89 kg/m   General: Pt is alert, awake, not in acute distress Cardiovascular: RRR, S1/S2 +, no rubs, no gallops Respiratory: CTA bilaterally, no wheezing, no rhonchi Abdominal: Soft, NT, ND, bowel sounds + Extremities: no edema, no cyanosis  The results of significant diagnostics from this hospitalization (including imaging, microbiology, ancillary and laboratory) are listed below for reference.     Microbiology: Recent Results (from the past 240 hour(s))  Blood culture (routine x 2)     Status: None (Preliminary result)   Collection Time: 05/21/21 11:07 AM   Specimen: BLOOD  Result Value Ref Range Status   Specimen Description   Final    BLOOD RIGHT ANTECUBITAL Performed at [facility] [address], [place], [place] [address]    Special Requests   Final    BOTTLES DRAWN AEROBIC AND ANAEROBIC Blood Culture adequate volume Performed at [facility] [address], [place], [place] [address]    Culture   Final    NO GROWTH 3 DAYS Performed at [facility], [place] [address] [place], [place] [address]    Report Status PENDING  Incomplete  Culture, blood (Routine X 2) w  Reflex to ID Panel     Status: None (Preliminary result)   Collection Time: 05/21/21 12:45 PM   Specimen: BLOOD  Result Value Ref Range Status   Specimen Description   Final    BLOOD LEFT ANTECUBITAL Performed at [facility] [address], [place], [place] [address]    Special Requests   Final    BOTTLES DRAWN AEROBIC AND ANAEROBIC Blood Culture adequate volume Performed at [facility] [address], [place], [place] [address]    Culture  Final    NO GROWTH 3 DAYS Performed at [facility], [place] [address] [place], [place] [address]    Report Status PENDING  Incomplete  Resp Panel by RT-PCR (Flu A&B, Covid) Nasopharyngeal Swab     Status: None   Collection Time: 05/21/21 12:53 PM   Specimen: Nasopharyngeal Swab; Nasopharyngeal(NP) swabs in vial transport medium  Result Value Ref Range Status   SARS Coronavirus 2 by RT PCR NEGATIVE NEGATIVE Final    Comment: (NOTE) SARS-CoV-2 target nucleic acids are NOT DETECTED.  The SARS-CoV-2 RNA is generally detectable in upper respiratory specimens during the acute phase of infection. The lowest concentration of SARS-CoV-2 viral copies this assay can detect is 138 copies/mL. A negative result does not preclude SARS-Cov-2 infection and should not be used as the sole basis for treatment or other patient management decisions. A negative result may occur with  improper specimen collection/handling, submission of specimen other than nasopharyngeal swab, presence of viral mutation(s) within the areas targeted by this assay, and inadequate number of viral copies(<138 copies/mL). A negative result must be combined with clinical observations, patient history, and epidemiological information. The expected result is Negative.  Fact Sheet for Patients:  EntrepreneurPulse.com.au  Fact Sheet for Healthcare Providers:  IncredibleEmployment.be  This test is no t yet  approved or cleared by the [place] FDA and  has been authorized for detection and/or diagnosis of SARS-CoV-2 by FDA under an Emergency Use Authorization (EUA). This EUA will remain  in effect (meaning this test can be used) for the duration of the COVID-19 declaration under Section 564(b)(1) of the Act, 21 U.S.C.section 360bbb-3(b)(1), unless the authorization is terminated  or revoked sooner.       Influenza A by PCR NEGATIVE NEGATIVE Final   Influenza B by PCR NEGATIVE NEGATIVE Final    Comment: (NOTE) The Xpert Xpress SARS-CoV-2/FLU/RSV plus assay is intended as an aid in the diagnosis of influenza from Nasopharyngeal swab specimens and should not be used as a sole basis for treatment. Nasal washings and aspirates are unacceptable for Xpert Xpress SARS-CoV-2/FLU/RSV testing.  Fact Sheet for Patients: EntrepreneurPulse.com.au  Fact Sheet for Healthcare Providers: IncredibleEmployment.be  This test is not yet approved or cleared by the [place] FDA and has been authorized for detection and/or diagnosis of SARS-CoV-2 by FDA under an Emergency Use Authorization (EUA). This EUA will remain in effect (meaning this test can be used) for the duration of the COVID-19 declaration under Section 564(b)(1) of the Act, 21 U.S.C. section 360bbb-3(b)(1), unless the authorization is terminated or revoked.  Performed at [facility], [place] [address], [place], [place] [address]      Labs: Basic Metabolic Panel: Recent Labs  Lab 05/21/21 1026 05/21/21 1244 05/22/21 0535 05/23/21 0545 05/24/21 1003  NA 133*  --  136 135 137  K 3.2*  --  4.0 3.8 3.2*  CL 102  --  106 101 103  CO2 24  --  24 26 25   GLUCOSE 136*  --  101* 106* 126*  BUN 6  --  6 6 16   CREATININE 0.64  --  0.55 0.67 0.98  CALCIUM 8.8*  --  8.6* 9.1 9.2  MG  --  1.7  --   --   --   PHOS  --  4.5  --   --   --    Liver Function Tests: Recent Labs  Lab  05/21/21 1026 05/22/21 0535  AST 31 24  ALT 17 16  ALKPHOS 54 42  BILITOT 0.5 0.6  PROT 7.9 6.9  ALBUMIN 3.4* 3.0*   CBC: Recent Labs  Lab 05/21/21 1026 05/22/21 0535 05/23/21 0545 05/24/21 1003  WBC 7.1 6.1 7.1 6.5  NEUTROABS 4.2  --   --   --   HGB 9.8* 8.7* 10.9* 10.9*  HCT 31.0* 27.7* 33.7* 33.5*  MCV 96.3 96.5 94.4 94.1  PLT 417* 359 408* 389   CBG: No results for input(s): GLUCAP in the last 168 hours. Hgb A1c No results for input(s): HGBA1C in the last 72 hours. Lipid Profile No results for input(s): CHOL, HDL, LDLCALC, TRIG, CHOLHDL, LDLDIRECT in the last 72 hours. Thyroid function studies Recent Labs    05/22/21 1154  T3FREE 3.3   Urinalysis    Component Value Date/Time   COLORURINE YELLOW 12/09/2017 [place] 12/09/2017 2312   LABSPEC >1.030 (H) 12/09/2017 2312   PHURINE 5.5 12/09/2017 2312   GLUCOSEU NEGATIVE 12/09/2017 2312   HGBUR NEGATIVE 12/09/2017 2312   BILIRUBINUR NEGATIVE 12/09/2017 2312   KETONESUR NEGATIVE 12/09/2017 2312   PROTEINUR NEGATIVE 12/09/2017 2312   UROBILINOGEN 0.2 04/09/2014 2230   NITRITE NEGATIVE 12/09/2017 2312   LEUKOCYTESUR NEGATIVE 12/09/2017 2312    FURTHER DISCHARGE INSTRUCTIONS:   Get Medicines reviewed and adjusted: Please take all your medications with you for your next visit with your Primary MD   Laboratory/radiological data: Please request your Primary MD to go over all hospital tests and procedure/radiological results at the follow up, please ask your Primary MD to get all Hospital records sent to his/her office.   In some cases, they will be blood work, cultures and biopsy results pending at the time of your discharge. Please request that your primary care M.D. goes through all the records of your hospital data and follows up on these results.   Also Note the following: If you experience worsening of your admission symptoms, develop shortness of breath, life threatening emergency,  suicidal or homicidal thoughts you must seek medical attention immediately by calling 911 or calling your MD immediately  if symptoms less severe.   You must read complete instructions/literature along with all the possible adverse reactions/side effects for all the Medicines you take and that have been prescribed to you. Take any new Medicines after you have completely understood and accpet all the possible adverse reactions/side effects.    Do not drive when taking Pain medications or sleeping medications (Benzodaizepines)   Do not take more than prescribed Pain, Sleep and Anxiety Medications. It is not advisable to combine anxiety,sleep and pain medications without talking with your primary care practitioner   Special Instructions: If you have smoked or chewed Tobacco  in the last 2 yrs please stop smoking, stop any regular Alcohol  and or any Recreational drug use.   Wear Seat belts while driving.   Please note: You were cared for by a hospitalist during your hospital stay. Once you are discharged, your primary care physician will handle any further medical issues. Please note that NO REFILLS for any discharge medications will be authorized once you are discharged, as it is imperative that you return to your primary care physician (or establish a relationship with a primary care physician if you do not have one) for your post hospital discharge needs so that they can reassess your need for medications and monitor your lab values.  Time coordinating discharge: 35 minutes  SIGNED:  Marzetta Board, MD, PhD 05/24/2021, 3:09 PM

## 2021-05-24 NOTE — Progress Notes (Addendum)
Progress Note  Patient Name: Arvada Seaborn Date of Encounter: 05/24/2021  [facility] Cardiologist: Pixie Casino, MD   Subjective   No acute overnight events. Patient looks much better today and she feels like she is feeling much better. She states the medicines we gave yesterday (Lasix, Bisoprolol, and hydrocortisone) worked great. Breathing has significantly improved. She was able to ambulate yesterday afternoon and this morning OK. She is still mildly tachycardic at times with rates in the low 100s but improved from yesterday.   Inpatient Medications    Scheduled Meds:  bisoprolol  10 mg Oral Daily   Chlorhexidine Gluconate Cloth  6 each Topical Daily   hydrocortisone  10 mg Oral QAC supper   hydrocortisone  20 mg Oral QAC breakfast   Continuous Infusions:  PRN Meds: acetaminophen **OR** acetaminophen, ondansetron **OR** ondansetron (ZOFRAN) IV, sodium chloride flush   Vital Signs    Vitals:   05/23/21 1627 05/23/21 2022 05/24/21 0021 05/24/21 0440  BP: 129/90 105/71 114/69 110/71  Pulse: 95 99 100 93  Resp: 20 20 20 20   Temp: 98.5 F (36.9 C) 98 F (36.7 C) 98.5 F (36.9 C) 99.1 F (37.3 C)  TempSrc: Oral Oral Oral Oral  SpO2: 100% 100% 98% 98%  Weight:      Height:       No intake or output data in the 24 hours ending 05/24/21 0853 Last 3 Weights 05/21/2021 04/29/2021 04/25/2021  Weight (lbs) 279 lb 287 lb 288 lb 6.4 oz  Weight (kg) 126.554 kg 130.182 kg 130.817 kg      Telemetry    Sinus rhythm with rates in the 90s to low 100s. - Personally Reviewed  ECG    No new ECG tracing today. - Personally Reviewed  Physical Exam   GEN: African-American female in no acute distress.   Neck: No JVD. Cardiac: Mildly tachycardic with normal rhythm. No significant murmurs, rubs, or gallops.  Respiratory: Clear to auscultation bilaterally. No wheezes, rhonchi, or rales. GI: Soft, non-distended, and non-tender.  MS: Trace lower extremity edema bilaterally. No  deformity. Skin: Warm and dry. Neuro:  No focal deficits. Psych: Normal affect. Responds appropriately.  Labs    High Sensitivity Troponin:   Recent Labs  Lab 05/21/21 1026 05/21/21 1244  TROPONINIHS 8 9     Chemistry Recent Labs  Lab 05/21/21 1026 05/21/21 1244 05/22/21 0535 05/23/21 0545  NA 133*  --  136 135  K 3.2*  --  4.0 3.8  CL 102  --  106 101  CO2 24  --  24 26  GLUCOSE 136*  --  101* 106*  BUN 6  --  6 6  CREATININE 0.64  --  0.55 0.67  CALCIUM 8.8*  --  8.6* 9.1  MG  --  1.7  --   --   PROT 7.9  --  6.9  --   ALBUMIN 3.4*  --  3.0*  --   AST 31  --  24  --   ALT 17  --  16  --   ALKPHOS 54  --  42  --   BILITOT 0.5  --  0.6  --   GFRNONAA >60  --  >60 >60  ANIONGAP 7  --  6 8    Lipids No results for input(s): CHOL, TRIG, HDL, LABVLDL, LDLCALC, CHOLHDL in the last 168 hours.  Hematology Recent Labs  Lab 05/21/21 1026 05/22/21 0535 05/23/21 0545  WBC 7.1 6.1 7.1  RBC 3.22* 2.87* 3.57*  HGB 9.8* 8.7* 10.9*  HCT 31.0* 27.7* 33.7*  MCV 96.3 96.5 94.4  MCH 30.4 30.3 30.5  MCHC 31.6 31.4 32.3  RDW 16.2* 16.4* 16.0*  PLT 417* 359 408*   Thyroid  Recent Labs  Lab 05/21/21 1358 05/22/21 1154  TSH 0.172*  --   FREET4  --  0.60*    BNP Recent Labs  Lab 05/23/21 0545  BNP 35.8    DDimer No results for input(s): DDIMER in the last 168 hours.   Radiology    ECHOCARDIOGRAM COMPLETE  Result Date: 05/22/2021    ECHOCARDIOGRAM REPORT   Patient Name:   KEYANDRA SWENSON Date of Exam: 05/22/2021 Medical Rec #:  [id number]   Height:       64.0 in Accession #:    [id number]  Weight:       279.0 lb Date of Birth:  [birth year]    BSA:          2.254 m Patient Age:    8 years    BP:           131/88 mmHg Patient Gender: F           HR:           97 bpm. Exam Location:  Inpatient Procedure: 2D Echo, Cardiac Doppler, Color Doppler and Strain Analysis Indications:     Murmur  History:         Patient has prior history of Echocardiogram examinations, most                   recent 11/19/2020. Signs/Symptoms:Dyspnea; Risk Factors:Chemo.  Sonographer:     Glo Herring Referring Phys:  [id number] [place] Diagnosing Phys: Franki Monte IMPRESSIONS  1. Left ventricular ejection fraction, by estimation, is 50%. The left ventricle has mildly decreased function. The left ventricle demonstrates global hypokinesis. Left ventricular diastolic parameters are consistent with Grade I diastolic dysfunction (impaired relaxation). The average left ventricular global longitudinal strain is -15.7 %. The global longitudinal strain is abnormal.  2. Right ventricular systolic function is normal. The right ventricular size is normal. There is normal pulmonary artery systolic pressure. The estimated right ventricular systolic pressure is 03.5 mmHg.  3. The mitral valve is normal in structure. Trivial mitral valve regurgitation. No evidence of mitral stenosis.  4. The aortic valve is tricuspid. Aortic valve regurgitation is not visualized. No aortic stenosis is present.  5. The inferior vena cava is normal in size with greater than 50% respiratory variability, suggesting right atrial pressure of 3 mmHg. FINDINGS  Left Ventricle: Left ventricular ejection fraction, by estimation, is 50%. The left ventricle has mildly decreased function. The left ventricle demonstrates global hypokinesis. The average left ventricular global longitudinal strain is -15.7 %. The global longitudinal strain is abnormal. The left ventricular internal cavity size was normal in size. There is no left ventricular hypertrophy. Left ventricular diastolic parameters are consistent with Grade I diastolic dysfunction (impaired relaxation). Right Ventricle: The right ventricular size is normal. No increase in right ventricular wall thickness. Right ventricular systolic function is normal. There is normal pulmonary artery systolic pressure. The tricuspid regurgitant velocity is 2.27 m/s, and  with an assumed right atrial  pressure of 3 mmHg, the estimated right ventricular systolic pressure is 59.7 mmHg. Left Atrium: Left atrial size was normal in size. Right Atrium: Right atrial size was normal in size. Pericardium: There is no evidence of pericardial effusion. Mitral Valve: The mitral valve is  normal in structure. Trivial mitral valve regurgitation. No evidence of mitral valve stenosis. Tricuspid Valve: The tricuspid valve is normal in structure. Tricuspid valve regurgitation is trivial. Aortic Valve: The aortic valve is tricuspid. Aortic valve regurgitation is not visualized. No aortic stenosis is present. Aortic valve mean gradient measures 4.0 mmHg. Aortic valve peak gradient measures 8.0 mmHg. Aortic valve area, by VTI measures 1.69 cm. Pulmonic Valve: The pulmonic valve was normal in structure. Pulmonic valve regurgitation is not visualized. Aorta: The aortic root is normal in size and structure. Venous: The inferior vena cava is normal in size with greater than 50% respiratory variability, suggesting right atrial pressure of 3 mmHg. IAS/Shunts: No atrial level shunt detected by color flow Doppler.  LEFT VENTRICLE PLAX 2D LVIDd:         5.30 cm      Diastology LVIDs:         3.90 cm      LV e' medial:    7.94 cm/s LV PW:         1.10 cm      LV E/e' medial:  10.3 LV IVS:        1.10 cm      LV e' lateral:   11.30 cm/s LVOT diam:     2.00 cm      LV E/e' lateral: 7.2 LV SV:         49 LV SV Index:   22           2D Longitudinal Strain LVOT Area:     3.14 cm     2D Strain GLS Avg:     -15.7 %  LV Volumes (MOD) LV vol d, MOD A2C: 124.0 ml LV vol d, MOD A4C: 131.0 ml LV vol s, MOD A2C: 70.7 ml LV vol s, MOD A4C: 68.9 ml LV SV MOD A2C:     53.3 ml LV SV MOD A4C:     131.0 ml LV SV MOD BP:      58.9 ml RIGHT VENTRICLE             IVC RV Basal diam:  3.50 cm     IVC diam: 1.50 cm RV S prime:     13.40 cm/s LEFT ATRIUM           Index        RIGHT ATRIUM           Index LA diam:      3.20 cm 1.42 cm/m   RA Area:     13.10 cm LA  Vol (A2C): 44.9 ml 19.92 ml/m  RA Volume:   28.30 ml  12.56 ml/m LA Vol (A4C): 45.0 ml 19.97 ml/m  AORTIC VALVE AV Area (Vmax):    1.70 cm AV Area (Vmean):   1.91 cm AV Area (VTI):     1.69 cm AV Vmax:           141.00 cm/s AV Vmean:          97.600 cm/s AV VTI:            0.288 m AV Peak Grad:      8.0 mmHg AV Mean Grad:      4.0 mmHg LVOT Vmax:         76.40 cm/s LVOT Vmean:        59.300 cm/s LVOT VTI:          0.155 m LVOT/AV VTI ratio: 0.54  AORTA Ao Root diam: 3.20 cm  Ao Asc diam:  2.90 cm MITRAL VALVE                TRICUSPID VALVE MV Area (PHT): 7.29 cm     TR Peak grad:   20.6 mmHg MV Decel Time: 104 msec     TR Vmax:        227.00 cm/s MV E velocity: 81.40 cm/s MV A velocity: 102.00 cm/s  SHUNTS MV E/A ratio:  0.80         Systemic VTI:  0.16 m                             Systemic Diam: 2.00 cm Dalton McleanMD Electronically signed by Franki Monte Signature Date/Time: 05/22/2021/6:24:32 PM    Final (Updated)     Cardiac Studies   Echocardiogram 05/22/2021: Impressions: 1. Left ventricular ejection fraction, by estimation, is 50%. The left  ventricle has mildly decreased function. The left ventricle demonstrates  global hypokinesis. Left ventricular diastolic parameters are consistent  with Grade I diastolic dysfunction  (impaired relaxation). The average left ventricular global longitudinal  strain is -15.7 %. The global longitudinal strain is abnormal.   2. Right ventricular systolic function is normal. The right ventricular  size is normal. There is normal pulmonary artery systolic pressure. The  estimated right ventricular systolic pressure is 81.1 mmHg.   3. The mitral valve is normal in structure. Trivial mitral valve  regurgitation. No evidence of mitral stenosis.   4. The aortic valve is tricuspid. Aortic valve regurgitation is not  visualized. No aortic stenosis is present.   5. The inferior vena cava is normal in size with greater than 50%  respiratory variability,  suggesting right atrial pressure of 3 mmHg.   Patient Profile     45 y.o. female with a history of breast cancer and morbid obesity who is being seen for the evaluation of dyspnea on exertion and tachycardia at the request of Dr. Cruzita Lederer.   Assessment & Plan    Dyspnea on Exertion New Cardiomyopathy - Patient presented with rest of dyspnea on exertion over the last 2 weeks.  No other signs of CHF.  No chest pain. - High-sensitivity troponin negative x2.  - Chest x-ray showed no acute findings.  - Chest CTA negative for PE. - BNP 35.8 but may be falsely low due to obesity.  - Echo showed 50% with global hypokinesis, grade 1 diastolic dysfunction, and abnormal global longitudinal strain of -15.7. EF was 70-75% and global longitudinal strain was -16.6 in 11/2020 prior to chemo.  - She has received 2 doses of IV Lasix so far as well as 1 unit or PRBC given anemia. No documented urinary output but patient reports she feels much better today after the additional dose of Lasix yesterday. - Can give Lasix 40mg  daily as needed at discharge for weight gain (3lbs in 1 day or 5lbs in 1 week), worsening lower extremity edema, or worsening shortness of breath. - She was on Toprol-XL at home but reported significant fatigue with this so she was switched to Bisoprolol yesterday and is tolerating that much better. Continue Bisoprolol 10mg  daily. - BP soft at times but could consider adding ARB as well. May be able to add as outpatient. - Suspect cardiomyopathy is due to chemotherapy drugs, specifically Adriamycin and Taxol.  She states she has completed chemo.  Decision was made to not come finish the last 3 weeks of Taxol due to  neuropathy.     Sinus Tachycardia - Rates still mildly elevated in the 90s to low 100s but better than on admission. - Potassium 3.2 on admission. Repleted and improve 3.8.  - Magnesium 1.7.  - TSH low at 0.172. Free TR slightly low at 0.60. Free T3 normal.  - On Toprol-XL at home  (25mg  in the morning and 50mg  in the evening). However, she noted significant fatigue with this. She was switched to Bisoprolol 10mg  daily and is tolerating well. Continue.  Possible Adrenal Insufficiency. - AM Cortisol <0.4. CTH pending. - Keytruda can cause adrenal insufficiency. Suspect this was a large cause of her symptoms. - Primary team has ordered brain MRI. Started on Hydrocortisone. Will defer further work-up to them.  Otherwise, per primary team: - Breast cancer - Chronic anemia: s/p 1 unit of PRBCs - Peripheral neuropathy - Abnormal TSH, free T4  For questions or updates, please contact [place] Please consult [email or web address] for contact info under        Signed, Darreld Mclean, PA-C  05/24/2021, 8:53 AM

## 2021-05-26 LAB — CULTURE, BLOOD (ROUTINE X 2)
Culture: NO GROWTH
Culture: NO GROWTH
Special Requests: ADEQUATE
Special Requests: ADEQUATE

## 2021-05-28 ENCOUNTER — Encounter: Payer: Self-pay | Admitting: *Deleted

## 2021-05-29 ENCOUNTER — Other Ambulatory Visit: Payer: Self-pay | Admitting: Hematology and Oncology

## 2021-05-29 ENCOUNTER — Telehealth: Payer: Self-pay | Admitting: Hematology and Oncology

## 2021-06-03 ENCOUNTER — Other Ambulatory Visit: Payer: Self-pay | Admitting: *Deleted

## 2021-06-03 MED ORDER — HYDROCORTISONE 5 MG PO TABS: ORAL_TABLET | ORAL | 1 refills | Status: DC

## 2021-06-03 MED ORDER — BISOPROLOL FUMARATE 10 MG PO TABS: 10.0000 mg | ORAL_TABLET | Freq: Every day | ORAL | 0 refills | Status: DC

## 2021-06-04 ENCOUNTER — Telehealth: Payer: Self-pay | Admitting: Hematology and Oncology

## 2021-06-04 NOTE — Telephone Encounter (Signed)
Scheduled per sch msg. Called, not able to leave msg. Mailed printotu  

## 2021-06-07 ENCOUNTER — Other Ambulatory Visit: Payer: Self-pay | Admitting: Hematology and Oncology

## 2021-06-13 ENCOUNTER — Encounter: Payer: Self-pay | Admitting: *Deleted

## 2021-06-18 ENCOUNTER — Ambulatory Visit: Payer: Self-pay | Admitting: Surgery

## 2021-06-20 ENCOUNTER — Telehealth: Payer: Self-pay | Admitting: Radiation Oncology

## 2021-06-20 ENCOUNTER — Encounter: Payer: Self-pay | Admitting: *Deleted

## 2021-06-20 DIAGNOSIS — C50411 Malignant neoplasm of upper-outer quadrant of right female breast: Secondary | ICD-10-CM

## 2021-06-20 NOTE — Telephone Encounter (Signed)
Called pt to sch consultation w. Dr. Sondra Come. No answer, VM not setup, unable to leave VM.

## 2021-06-22 NOTE — Progress Notes (Deleted)
Cardiology Office Note:    Date:  06/22/2021   ID:  Amber White, DOB 1976-05-31, MRN [id number]  PCP:  [name], MD  Cardiologist:  Pixie Casino, MD  Electrophysiologist:  None   Referring MD: [name], MD   Chief Complaint: hospital follow-up for cardiomyopathy  History of Present Illness:    Amber White is a 45 y.o. 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 Debara Pickett and presents today for hospital follow-up of newly diagnosed cardiomyopathy.  Patient was recently admitted from 05/21/2021 to 05/24/2021 for severe dyspnea on exertion and tachypalpitations. High-sensitivity troponin negative x2. BNP 35 (but possibly falsely low due to obesity). Chest CTA negative for PE. Hemoglobin 9.8. Echo showed 50% with global hypokinesis, grade 1 diastolic dysfunction, and abnormal global longitudinal strain of -15.7. EF was 70-75% and global longitudinal strain was -16.6 in 11/2020 prior to starting chemo for breast cancer. She was transfused one unit of PRBC, given a couple of doses of IV Lasix, and was switched form Toprol-XL to Bisoprolol (given significant fatigue on Toprol-XL). 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 Thankfully, she has completed treatment. However, symptoms were felt to largely be due to adrenal insufficiency. AM cortisol <0.4. Keytruda which she was also on for breast cancer can cause adrenal insufficiency. Brain MR showed no mass on pituitary gland. Symptoms improved after receiving steroids.  Patient presents today for follow-up. ***  Chemo Induced Cardiomyopathy Patient recently admitted for dyspnea on exertion and tachypalpitations. Felt to mainly be due to adrenal insufficiency but also found to have new cardiomyopathy. Echo showed 50% with global hypokinesis, grade 1 diastolic dysfunction, and abnormal  global longitudinal strain of -15.7. EF was 70-75% and global longitudinal strain was -16.6 in 11/2020 prior to starting chemo for breast cancer. Cardiomyopathy felt to be chemo-induced from Adriamycin. Thankfully, she has completed treatment. - Euvolemic on exam.  - Continue Bisoprolol 10mg  daily.  - ARB *** - Discussed importance of daily weight and sodium/fluid instructions.   Adrenal Insufficiency AM cortisol during recent admission <0.4. ACTH <1.5. Brain MRI showed no pituitary mass. Keytruda which she was also on for breast cancer can cause adrenal insufficiency. - On Hydrocortisone. - Management per PCP.  Past Medical History:  Diagnosis Date   breast ca dx'd 10/2020   right   Family history of breast cancer    No pertinent past medical history     Past Surgical History:  Procedure Laterality Date   CHOLECYSTECTOMY     PORTACATH PLACEMENT N/A 11/21/2020   Procedure: INSERTION PORT-A-CATH;  Surgeon: Donnie Mesa, MD;  Location: [facility];  Service: General;  Laterality: N/A;    Current Medications: No outpatient medications have been marked as taking for the 06/27/21 encounter (Appointment) with Darreld Mclean, PA-C.     Allergies:   Paclitaxel and Penicillins   Social History   Socioeconomic History   Marital status: Single    Spouse name: Not on file   Number of children: Not on file   Years of education: Not on file   Highest education level: Not on file  Occupational History   Not on file  Tobacco Use   Smoking status: Never   Smokeless tobacco: Never  Vaping Use   Vaping Use: Never used  Substance and Sexual Activity   Alcohol use: Yes    Comment:  rare   Drug use: No   Sexual activity: Yes    Birth control/protection: None  Other Topics Concern   Not on file  Social History Narrative   Not on file   Social Determinants of Health   Financial Resource Strain: Not on file  Food Insecurity: Not on file  Transportation Needs: Not  on file  Physical Activity: Not on file  Stress: Not on file  Social Connections: Not on file     Family History: The patient's family history includes Breast cancer in her mother; Diabetes in her maternal grandmother and maternal uncle; Heart disease in her maternal grandmother.  ROS:   Please see the history of present illness.     EKGs/Labs/Other Studies Reviewed:    The following studies were reviewed today:  Echocardiogram 11/19/2020: Impressions:  1. Global longitudinal strain is -16.6%. Left ventricular ejection  fraction, by estimation, is 70 to 75%. The left ventricle has hyperdynamic  function. The left ventricle has no regional wall motion abnormalities.  Left ventricular diastolic parameters  were normal.   2. Right ventricular systolic function is normal. The right ventricular  size is normal.   3. The mitral valve is normal in structure. Trivial mitral valve  regurgitation.   4. The aortic valve is normal in structure. Aortic valve regurgitation is  not visualized. _______________  Echocardiogram 05/22/2021: Impressions: 1. Left ventricular ejection fraction, by estimation, is 50%. The left  ventricle has mildly decreased function. The left ventricle demonstrates  global hypokinesis. Left ventricular diastolic parameters are consistent  with Grade I diastolic dysfunction  (impaired relaxation). The average left ventricular global longitudinal  strain is -15.7 %. The global longitudinal strain is abnormal.   2. Right ventricular systolic function is normal. The right ventricular  size is normal. There is normal pulmonary artery systolic pressure. The  estimated right ventricular systolic pressure is 40.3 mmHg.   3. The mitral valve is normal in structure. Trivial mitral valve  regurgitation. No evidence of mitral stenosis.   4. The aortic valve is tricuspid. Aortic valve regurgitation is not  visualized. No aortic stenosis is present.   5. The inferior vena  cava is normal in size with greater than 50%  respiratory variability, suggesting right atrial pressure of 3 mmHg.  EKG:  EKG not ordered today.   Recent Labs: 05/21/2021: Magnesium 1.7; TSH 0.172 05/22/2021: ALT 16 05/23/2021: B Natriuretic Peptide 35.8 05/24/2021: BUN 16; Creatinine, Ser 0.98; Hemoglobin 10.9; Platelets 389; Potassium 3.2; Sodium 137  Recent Lipid Panel No results found for: CHOL, TRIG, HDL, CHOLHDL, VLDL, LDLCALC, LDLDIRECT  Physical Exam:    Vital Signs: There were no vitals taken for this visit.    Wt Readings from Last 3 Encounters:  05/21/21 279 lb (126.6 kg)  04/29/21 287 lb (130.2 kg)  04/25/21 288 lb 6.4 oz (130.8 kg)     General: 45 y.o. female in no acute distress. HEENT: Normocephalic and atraumatic. Sclera clear. EOMs intact. Neck: Supple. No carotid bruits. No JVD. Heart: *** RRR. Distinct S1 and S2. No murmurs, gallops, or rubs. Radial and distal pedal pulses 2+ and equal bilaterally. Lungs: No increased work of breathing. Clear to ausculation bilaterally. No wheezes, rhonchi, or rales.  Abdomen: Soft, non-distended, and non-tender to palpation. Bowel sounds present in all 4 quadrants.  MSK: Normal strength and tone for age. *** Extremities: No lower extremity edema.    Skin: Warm and dry. Neuro: Alert and oriented x3. No focal deficits. Psych: Normal affect. Responds appropriately.  Assessment:    No diagnosis found.  Plan:     Disposition: Follow up in ***   Medication Adjustments/Labs and Tests Ordered: Current medicines are reviewed at length with the patient today.  Concerns regarding medicines are outlined above.  No orders of the defined types were placed in this encounter.  No orders of the defined types were placed in this encounter.   There are no Patient Instructions on file for this visit.   Signed, Darreld Mclean, PA-C  06/22/2021 9:15 AM    [place]

## 2021-06-24 NOTE — Progress Notes (Signed)
Surgical Instructions    Your procedure is scheduled on Thursday December 15th.  Report to [facility] Main Entrance "A" at 5:30 A.M., then check in with the Admitting office.  Call this number if you have problems the morning of surgery:  [phone]   If you have any questions prior to your surgery date call [phone]: Open Monday-Friday 8am-4pm    Remember:  Do not eat after midnight the night before your surgery  You may drink clear liquids until 4:30am the morning of your surgery.   Clear liquids allowed are: Water, Non-Citrus Juices (without pulp), Carbonated Beverages, Clear Tea, Black Coffee ONLY (NO MILK, CREAM OR POWDERED CREAMER of any kind), and Gatorade    Take these medicines the morning of surgery with A SIP OF WATER bisoprolol (ZEBETA) 10 MG tablet hydrocortisone (CORTEF) 5 MG tablet  IF NEEDED omeprazole (PRILOSEC) 40 MG capsule  As of today, STOP taking any Aspirin (unless otherwise instructed by your surgeon) Aleve, Naproxen, Ibuprofen, Motrin, Advil, Goody's, BC's, all herbal medications, fish oil, and all vitamins.     After your COVID test   You are not required to quarantine however you are required to wear a well-fitting mask when you are out and around people not in your household.  If your mask becomes wet or soiled, replace with a new one.  Wash your hands often with soap and water for 20 seconds or clean your hands with an alcohol-based hand sanitizer that contains at least 60% alcohol.  Do not share personal items.  Notify your provider: if you are in close contact with someone who has COVID  or if you develop a fever of 100.4 or greater, sneezing, cough, sore throat, shortness of breath or body aches.             Do not wear jewelry or makeup Do not wear lotions, powders, perfumes, or deodorant. Do not shave 48 hours prior to surgery.   Do not bring valuables to the hospital. DO Not wear nail polish, gel polish, artificial nails, or any  other type of covering on natural nails including finger and toenails. If patients have artificial nails, gel coating, etc. that need to be removed by a nail salon, please have this removed prior to surgery or surgery may need to be canceled/delayed if the surgeon/ anesthesia feels like the patient is unable to be adequately monitored.             [facility] is not responsible for any belongings or valuables.  Do NOT Smoke (Tobacco/Vaping)  24 hours prior to your procedure  If you use a CPAP at night, you may bring your mask for your overnight stay.   Contacts, glasses, hearing aids, dentures or partials may not be worn into surgery, please bring cases for these belongings   For patients admitted to the hospital, discharge time will be determined by your treatment team.   Patients discharged the day of surgery will not be allowed to drive home, and someone needs to stay with them for 24 hours.  NO VISITORS WILL BE ALLOWED IN PRE-OP WHERE PATIENTS ARE PREPPED FOR SURGERY.  ONLY 1 SUPPORT PERSON MAY BE PRESENT IN THE WAITING ROOM WHILE YOU ARE IN SURGERY.  IF YOU ARE TO BE ADMITTED, ONCE YOU ARE IN YOUR ROOM YOU WILL BE ALLOWED TWO (2) VISITORS. 1 (ONE) VISITOR MAY STAY OVERNIGHT BUT MUST ARRIVE TO THE ROOM BY 8pm.  Minor children may have two parents present. Special consideration for  safety and communication needs will be reviewed on a case by case basis.  Special instructions:    Oral Hygiene is also important to reduce your risk of infection.  Remember - BRUSH YOUR TEETH THE MORNING OF SURGERY WITH YOUR REGULAR TOOTHPASTE   [facility]- Preparing For Surgery  Before surgery, you can play an important role. Because skin is not sterile, your skin needs to be as free of germs as possible. You can reduce the number of germs on your skin by washing with CHG (chlorahexidine gluconate) Soap before surgery.  CHG is an antiseptic cleaner which kills germs and bonds with the skin to continue killing  germs even after washing.     Please do not use if you have an allergy to CHG or antibacterial soaps. If your skin becomes reddened/irritated stop using the CHG.  Do not shave (including legs and underarms) for at least 48 hours prior to first CHG shower. It is OK to shave your face.  Please follow these instructions carefully.     Shower the NIGHT BEFORE SURGERY and the MORNING OF SURGERY with CHG Soap.   If you chose to wash your hair, wash your hair first as usual with your normal shampoo. After you shampoo, rinse your hair and body thoroughly to remove the shampoo.  Then [employer] and genitals (private parts) with your normal soap and rinse thoroughly to remove soap.  After that Use CHG Soap as you would any other liquid soap. You can apply CHG directly to the skin and wash gently with a scrungie or a clean washcloth.   Apply the CHG Soap to your body ONLY FROM THE NECK DOWN.  Do not use on open wounds or open sores. Avoid contact with your eyes, ears, mouth and genitals (private parts). Wash Face and genitals (private parts)  with your normal soap.   Wash thoroughly, paying special attention to the area where your surgery will be performed.  Thoroughly rinse your body with warm water from the neck down.  DO NOT shower/wash with your normal soap after using and rinsing off the CHG Soap.  Pat yourself dry with a CLEAN TOWEL.  Wear CLEAN PAJAMAS to bed the night before surgery  Place CLEAN SHEETS on your bed the night before your surgery  DO NOT SLEEP WITH PETS.   Day of Surgery:  Take a shower with CHG soap. Wear Clean/Comfortable clothing the morning of surgery Do not apply any deodorants/lotions.   Remember to brush your teeth WITH YOUR REGULAR TOOTHPASTE.   Please read over the following fact sheets that you were given.

## 2021-06-25 ENCOUNTER — Other Ambulatory Visit: Payer: Self-pay

## 2021-06-25 ENCOUNTER — Encounter (HOSPITAL_COMMUNITY): Payer: Self-pay

## 2021-06-25 ENCOUNTER — Encounter (HOSPITAL_COMMUNITY)
Admission: RE | Admit: 2021-06-25 | Discharge: 2021-06-25 | Disposition: A | Discharge status: Home / Self Care | Payer: No Typology Code available for payment source | Source: Ambulatory Visit | Attending: Surgery | Admitting: Surgery

## 2021-06-25 VITALS — BP 135/86 | HR 80 | Temp 98.6°F | Resp 17 | Ht 64.0 in | Wt 274.6 lb

## 2021-06-25 DIAGNOSIS — Z20822 Contact with and (suspected) exposure to covid-19: Secondary | ICD-10-CM | POA: Insufficient documentation

## 2021-06-25 DIAGNOSIS — D6481 Anemia due to antineoplastic chemotherapy: Secondary | ICD-10-CM | POA: Insufficient documentation

## 2021-06-25 DIAGNOSIS — Z171 Estrogen receptor negative status [ER-]: Secondary | ICD-10-CM | POA: Insufficient documentation

## 2021-06-25 DIAGNOSIS — Z01812 Encounter for preprocedural laboratory examination: Secondary | ICD-10-CM | POA: Insufficient documentation

## 2021-06-25 DIAGNOSIS — C50411 Malignant neoplasm of upper-outer quadrant of right female breast: Secondary | ICD-10-CM | POA: Insufficient documentation

## 2021-06-25 DIAGNOSIS — I429 Cardiomyopathy, unspecified: Secondary | ICD-10-CM | POA: Diagnosis not present

## 2021-06-25 DIAGNOSIS — Z01818 Encounter for other preprocedural examination: Secondary | ICD-10-CM

## 2021-06-25 DIAGNOSIS — Z6841 Body Mass Index (BMI) 40.0 and over, adult: Secondary | ICD-10-CM | POA: Insufficient documentation

## 2021-06-25 DIAGNOSIS — T451X5A Adverse effect of antineoplastic and immunosuppressive drugs, initial encounter: Secondary | ICD-10-CM

## 2021-06-25 HISTORY — DX: Polyneuropathy, unspecified: G62.9

## 2021-06-25 LAB — CBC
HCT: 33.5 % — ABNORMAL LOW (ref 36.0–46.0)
Hemoglobin: 10.7 g/dL — ABNORMAL LOW (ref 12.0–15.0)
MCH: 30.7 pg (ref 26.0–34.0)
MCHC: 31.9 g/dL (ref 30.0–36.0)
MCV: 96 fL (ref 80.0–100.0)
Platelets: 287 10*3/uL (ref 150–400)
RBC: 3.49 MIL/uL — ABNORMAL LOW (ref 3.87–5.11)
RDW: 14 % (ref 11.5–15.5)
WBC: 6.2 10*3/uL (ref 4.0–10.5)
nRBC: 0 % (ref 0.0–0.2)

## 2021-06-25 LAB — BASIC METABOLIC PANEL
Anion gap: 8 (ref 5–15)
BUN: 8 mg/dL (ref 6–20)
CO2: 24 mmol/L (ref 22–32)
Calcium: 9.4 mg/dL (ref 8.9–10.3)
Chloride: 105 mmol/L (ref 98–111)
Creatinine, Ser: 0.71 mg/dL (ref 0.44–1.00)
GFR, Estimated: >60 mL/min (ref >60)
Glucose, Bld: 105 mg/dL — ABNORMAL HIGH (ref 70–99)
Potassium: 3.9 mmol/L (ref 3.5–5.1)
Sodium: 137 mmol/L (ref 135–145)

## 2021-06-25 LAB — SARS CORONAVIRUS 2 (TAT 6-24 HRS): SARS Coronavirus 2: NEGATIVE

## 2021-06-25 NOTE — Progress Notes (Addendum)
[place] to patient to find out if she was still coming to her 8am appt, unable to leave message no answer.

## 2021-06-25 NOTE — Progress Notes (Signed)
PCP - Dr. Donnetta Hutching Cardiologist - Saw Sande Rives and will go to another appt with Dr. Debara Pickett in February as a f/u for her hospital admission in November. Came in with SOB.   Chest x-ray - 05/21/21 EKG - 05/22/21 Stress Test - Denies ECHO - 05/22/21 Cardiac Cath - Denies  Sleep Study - Denies  DM - Denies  ERAS Protcol - Yes  COVID TEST-  06/25/21   Anesthesia review: Yes recent hospital admission   Patient has had shortness of breath since beginning her chemo treatments, otherwise no fever, cough or chest pain at PAT appointment   All instructions explained to the patient, with a verbal understanding of the material. Patient agrees to go over the instructions while at home for a better understanding. Patient also instructed to wear a mask while in public after being tested for COVID-19. The opportunity to ask questions was provided.

## 2021-06-26 ENCOUNTER — Encounter (HOSPITAL_COMMUNITY): Payer: Self-pay

## 2021-06-26 NOTE — Progress Notes (Signed)
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  BMI is consistent with morbid obesity.  - [facility] admission 05/21/21-05/24/21 for worsening DOE and tachycardia in setting of recent chemotherapy with anemia. Also diagnosed with adrenal insufficiency and chemo-induced mild cardiomyopathy. Lactic acid, troponin, CK, BNP, COVID, and flu tests unremarkable. CTA negative for PE and no evidence of thoracic metastatic disease. She was transfused 1 unit PRBC for HGB 8.7. TSH decreased at 0.172, T3 increased at 212, Free T3 normal 3.3, and Free T4 low at 0.60. ACTH and Cortisol levels low at < 1.5 and < 0.4, respectively. Taxol recently discontinued for peripheral neuropathy. Cardiology consulted after echo showed a decrease in LVEF to 50% with global hypokinesis and reduction in GLS to 15.7%, new since May. 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 She was changed to bisoprolol for improved b-blocker tolerance. BP would not allow for initiation of ARB/ACEi. No additional cardiac testing ordered. Abnormal thyroid studies and undetectable cortisol level also suggested adrenal insufficiency, and she was started on Cortef. She  had marked improvement in her symptoms after starting steroids. Appears oncologist Dr. Lindi Adie was contacted during her admission. She was discharged with plans for oncology, cardiology, and endocrinology out-patient follow-up. Known to already be scheduled for mastectomy on 06/27/21. Unclear if Dr. Georgette Dover was notified of admission, so staff message sent with update.       HR 80 with VS. H/H stable at 10.7/33.5. She was instructed to take her scheduled Cortef on the morning of surgery.    VS: BP 135/86    Pulse 80    Temp 37 C (Oral)    Resp 17    Ht 5\' 4"  (1.626 m)    Wt 124.6 kg    SpO2 100%    BMI 47.13 kg/m    PROVIDERS: [place], Rosalyn Charters, MD is PCP Lyman Bishop, MD is cardiologist (new 05/23/21). Nicholas Lose, MD is HEM-ONC Gery Pray, MD is RAD-ONC. Appt pending.    LABS: Labs reviewed: Acceptable for surgery. (all labs ordered are listed, but only abnormal results are displayed)  Labs Reviewed  CBC - Abnormal; Notable for the following components:      Result Value   RBC 3.49 (*)    Hemoglobin 10.7 (*)    HCT 33.5 (*)    All other components within normal limits  BASIC METABOLIC PANEL - Abnormal; Notable for the following components:   Glucose, Bld 105 (*)    All other components within normal limits  SARS CORONAVIRUS 2 (TAT 6-24 HRS)     IMAGES: MRI Brain 05/24/21: IMPRESSION: 1. Evaluation of the pituitary is somewhat limited by motion; however, within this limitation, no focal masses seen within the sella or pituitary gland. 2. No acute intracranial process.   CTA Chest 05/21/21: IMPRESSION: 1. No evidence  of acute pulmonary embolism or other acute chest process. 2. Previously demonstrated axillary lymphadenopathy has resolved. No evidence of thoracic metastatic disease.  CXR 05/21/21: FINDINGS: A right chest infusion port catheter is present with tip projecting at the level of the lower SVC. Heart size within normal limits. No appreciable airspace  consolidation or pulmonary edema. No evidence of pleural effusion or pneumothorax. No acute bony abnormality identified. Mild thoracolumbar dextrocurvature. Surgical clips within the upper abdomen. IMPRESSION: No evidence of acute cardiopulmonary abnormality.   EKG: 05/21/21: Sinus tachycardia at 128 bpm Consider right atrial enlargement Low voltage, precordial leads no sig change from previous Confirmed by Charlesetta Shanks [phone]) on 05/21/2021 10:45:47 AM   CV: Echo 05/22/21: IMPRESSIONS   1. Left ventricular ejection fraction, by estimation, is 50%. The left  ventricle has mildly decreased function. The left ventricle demonstrates  global hypokinesis. Left ventricular diastolic parameters are consistent  with Grade I diastolic dysfunction  (impaired relaxation). The average left ventricular global longitudinal  strain is -15.7 %. The global longitudinal strain is abnormal.   2. Right ventricular systolic function is normal. The right ventricular  size is normal. There is normal pulmonary artery systolic pressure. The  estimated right ventricular systolic pressure is 15.4 mmHg.   3. The mitral valve is normal in structure. Trivial mitral valve  regurgitation. No evidence of mitral stenosis.   4. The aortic valve is tricuspid. Aortic valve regurgitation is not  visualized. No aortic stenosis is present.   5. The inferior vena cava is normal in size with greater than 50%  respiratory variability, suggesting right atrial pressure of 3 mmHg.  - Comparison 11/19/20: LVEF 70-75%, no regional wall motion abnormality, trivial MR   Past Medical History:  Diagnosis Date   Adrenal insufficiency ([place]) 05/2021   in setting of chemotherapy   breast ca dx'd 10/2020   right   Cardiomyopathy ([place]) 05/2021   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   Family history of breast cancer    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    Past Surgical History:  Procedure Laterality Date    CHOLECYSTECTOMY     PORTACATH PLACEMENT N/A 11/21/2020   Procedure: INSERTION PORT-A-CATH;  Surgeon: Donnie Mesa, MD;  Location: [facility];  Service: General;  Laterality: N/A;    MEDICATIONS:  ADDERALL XR 30 MG 24 hr capsule   bisoprolol (ZEBETA) 10 MG tablet   gabapentin (NEURONTIN) 100 MG capsule   hydrocortisone (CORTEF) 5 MG tablet   omeprazole (PRILOSEC) 40 MG capsule   OVER THE COUNTER MEDICATION   No current facility-administered medications for this encounter.    filgrastim-aafi (NIVESTYM) 480 MCG/0.8ML injection    Myra Gianotti, PA-C Surgical Short Stay/Anesthesiology [facility] Phone [phone] [facility] Phone [phone] 06/26/2021 10:45 AM

## 2021-06-26 NOTE — Anesthesia Preprocedure Evaluation (Addendum)
Anesthesia Evaluation  Patient identified by MRN, date of birth, ID band Patient awake    Reviewed: Allergy & Precautions, NPO status , Patient's Chart, lab work & pertinent test results  Airway Mallampati: II  TM Distance: >3 FB     Dental   Pulmonary shortness of breath,    breath sounds clear to auscultation       Cardiovascular negative cardio ROS   Rhythm:Regular Rate:Normal     Neuro/Psych  Neuromuscular disease    GI/Hepatic negative GI ROS, Neg liver ROS,   Endo/Other  negative endocrine ROS  Renal/GU negative Renal ROS     Musculoskeletal   Abdominal   Peds  Hematology   Anesthesia Other Findings   Reproductive/Obstetrics                             Anesthesia Physical Anesthesia Plan  ASA: 2  Anesthesia Plan: General   Post-op Pain Management:    Induction: Intravenous  PONV Risk Score and Plan: 3 and Ondansetron, Dexamethasone and Midazolam  Airway Management Planned: Oral ETT  Additional Equipment:   Intra-op Plan:   Post-operative Plan: Extubation in OR  Informed Consent: I have reviewed the patients History and Physical, chart, labs and discussed the procedure including the risks, benefits and alternatives for the proposed anesthesia with the patient or authorized representative who has indicated his/her understanding and acceptance.     Dental advisory given  Plan Discussed with: CRNA and Anesthesiologist  Anesthesia Plan Comments: (PAT note written 06/26/2021 by Myra Gianotti, PA-C. )       Anesthesia Quick Evaluation

## 2021-06-27 ENCOUNTER — Other Ambulatory Visit: Payer: Self-pay

## 2021-06-27 ENCOUNTER — Encounter (HOSPITAL_COMMUNITY)
Admission: RE | Disposition: A | Discharge status: Home / Self Care | Payer: Self-pay | Source: Ambulatory Visit | Attending: Surgery

## 2021-06-27 ENCOUNTER — Ambulatory Visit (HOSPITAL_COMMUNITY): Payer: No Typology Code available for payment source | Admitting: Certified Registered"

## 2021-06-27 ENCOUNTER — Ambulatory Visit: Payer: No Typology Code available for payment source | Admitting: Student

## 2021-06-27 ENCOUNTER — Ambulatory Visit (HOSPITAL_COMMUNITY): Payer: No Typology Code available for payment source | Admitting: Vascular Surgery

## 2021-06-27 ENCOUNTER — Encounter (HOSPITAL_COMMUNITY): Payer: Self-pay | Admitting: Surgery

## 2021-06-27 ENCOUNTER — Observation Stay (HOSPITAL_COMMUNITY)
Admission: RE | Admit: 2021-06-27 | Discharge: 2021-06-28 | Disposition: A | Discharge status: Home / Self Care | Payer: No Typology Code available for payment source | Source: Ambulatory Visit | Attending: Surgery | Admitting: Surgery

## 2021-06-27 DIAGNOSIS — C50911 Malignant neoplasm of unspecified site of right female breast: Secondary | ICD-10-CM | POA: Diagnosis not present

## 2021-06-27 DIAGNOSIS — I429 Cardiomyopathy, unspecified: Secondary | ICD-10-CM | POA: Diagnosis not present

## 2021-06-27 DIAGNOSIS — Z171 Estrogen receptor negative status [ER-]: Secondary | ICD-10-CM | POA: Diagnosis not present

## 2021-06-27 DIAGNOSIS — C50919 Malignant neoplasm of unspecified site of unspecified female breast: Secondary | ICD-10-CM

## 2021-06-27 DIAGNOSIS — C773 Secondary and unspecified malignant neoplasm of axilla and upper limb lymph nodes: Secondary | ICD-10-CM | POA: Insufficient documentation

## 2021-06-27 DIAGNOSIS — C50411 Malignant neoplasm of upper-outer quadrant of right female breast: Secondary | ICD-10-CM | POA: Insufficient documentation

## 2021-06-27 DIAGNOSIS — Z4001 Encounter for prophylactic removal of breast: Principal | ICD-10-CM | POA: Insufficient documentation

## 2021-06-27 HISTORY — PX: MODIFIED MASTECTOMY: SHX5268

## 2021-06-27 HISTORY — PX: TOTAL MASTECTOMY: SHX6129

## 2021-06-27 LAB — POCT PREGNANCY, URINE: Preg Test, Ur: NEGATIVE

## 2021-06-27 SURGERY — MODIFIED MASTECTOMY
Anesthesia: General | Site: Breast | Laterality: Right

## 2021-06-27 MED ORDER — MIDAZOLAM HCL 2 MG/2ML IJ SOLN
INTRAMUSCULAR | Status: AC
  Filled 2021-06-27: qty 2

## 2021-06-27 MED ORDER — BISOPROLOL FUMARATE 5 MG PO TABS
10.0000 mg | ORAL_TABLET | Freq: Every day | ORAL | Status: DC
  Administered 2021-06-28: 10 mg via ORAL
  Filled 2021-06-27: qty 2

## 2021-06-27 MED ORDER — DIPHENHYDRAMINE HCL 50 MG/ML IJ SOLN: 25.0000 mg | Freq: Four times a day (QID) | INTRAMUSCULAR | Status: DC | PRN

## 2021-06-27 MED ORDER — PROPOFOL 10 MG/ML IV BOLUS
INTRAVENOUS | Status: AC
  Filled 2021-06-27: qty 20

## 2021-06-27 MED ORDER — ORAL CARE MOUTH RINSE: 15.0000 mL | Freq: Once | OROMUCOSAL | Status: AC

## 2021-06-27 MED ORDER — ONDANSETRON 4 MG PO TBDP: 4.0000 mg | ORAL_TABLET | Freq: Four times a day (QID) | ORAL | Status: DC | PRN

## 2021-06-27 MED ORDER — METHOCARBAMOL 500 MG PO TABS
500.0000 mg | ORAL_TABLET | Freq: Four times a day (QID) | ORAL | Status: DC | PRN
  Filled 2021-06-27: qty 1

## 2021-06-27 MED ORDER — PROPOFOL 10 MG/ML IV BOLUS
INTRAVENOUS | Status: DC | PRN
  Administered 2021-06-27: 150 mg via INTRAVENOUS

## 2021-06-27 MED ORDER — LACTATED RINGERS IV SOLN: INTRAVENOUS | Status: DC

## 2021-06-27 MED ORDER — HYDROMORPHONE HCL 1 MG/ML IJ SOLN
INTRAMUSCULAR | Status: AC
  Filled 2021-06-27: qty 0.5

## 2021-06-27 MED ORDER — DIPHENHYDRAMINE HCL 25 MG PO CAPS: 25.0000 mg | ORAL_CAPSULE | Freq: Four times a day (QID) | ORAL | Status: DC | PRN

## 2021-06-27 MED ORDER — SUCCINYLCHOLINE CHLORIDE 200 MG/10ML IV SOSY
PREFILLED_SYRINGE | INTRAVENOUS | Status: DC | PRN
  Administered 2021-06-27: 140 mg via INTRAVENOUS

## 2021-06-27 MED ORDER — HEMOSTATIC AGENTS (NO CHARGE) OPTIME
TOPICAL | Status: DC | PRN
  Administered 2021-06-27 (×2): 1 via TOPICAL

## 2021-06-27 MED ORDER — HYDROCORTISONE 5 MG PO TABS
5.0000 mg | ORAL_TABLET | Freq: Every day | ORAL | Status: DC
  Administered 2021-06-27: 5 mg via ORAL
  Filled 2021-06-27 (×2): qty 1

## 2021-06-27 MED ORDER — ACETAMINOPHEN 500 MG PO TABS
1000.0000 mg | ORAL_TABLET | ORAL | Status: AC
  Administered 2021-06-27: 1000 mg via ORAL
  Filled 2021-06-27: qty 2

## 2021-06-27 MED ORDER — ROCURONIUM BROMIDE 10 MG/ML (PF) SYRINGE
PREFILLED_SYRINGE | INTRAVENOUS | Status: AC
  Filled 2021-06-27: qty 10

## 2021-06-27 MED ORDER — HYDROMORPHONE HCL 1 MG/ML IJ SOLN
INTRAMUSCULAR | Status: DC | PRN
  Administered 2021-06-27: 1 mg via INTRAVENOUS

## 2021-06-27 MED ORDER — OXYCODONE HCL 5 MG PO TABS: 5.0000 mg | ORAL_TABLET | ORAL | Status: DC | PRN

## 2021-06-27 MED ORDER — AMPHETAMINE-DEXTROAMPHET ER 10 MG PO CP24
30.0000 mg | ORAL_CAPSULE | Freq: Every morning | ORAL | Status: DC
  Administered 2021-06-27 – 2021-06-28 (×2): 30 mg via ORAL
  Filled 2021-06-27 (×2): qty 3

## 2021-06-27 MED ORDER — CHLORHEXIDINE GLUCONATE CLOTH 2 % EX PADS: 6.0000 | MEDICATED_PAD | Freq: Once | CUTANEOUS | Status: DC

## 2021-06-27 MED ORDER — LACTATED RINGERS IV SOLN: INTRAVENOUS | Status: DC | PRN

## 2021-06-27 MED ORDER — CHLORHEXIDINE GLUCONATE 0.12 % MT SOLN
15.0000 mL | Freq: Once | OROMUCOSAL | Status: AC
  Administered 2021-06-27: 15 mL via OROMUCOSAL
  Filled 2021-06-27: qty 15

## 2021-06-27 MED ORDER — DEXAMETHASONE SODIUM PHOSPHATE 10 MG/ML IJ SOLN
INTRAMUSCULAR | Status: DC | PRN
  Administered 2021-06-27: 4 mg via INTRAVENOUS

## 2021-06-27 MED ORDER — FENTANYL CITRATE (PF) 100 MCG/2ML IJ SOLN: 25.0000 ug | INTRAMUSCULAR | Status: DC | PRN

## 2021-06-27 MED ORDER — MIDAZOLAM HCL 5 MG/5ML IJ SOLN
INTRAMUSCULAR | Status: DC | PRN
  Administered 2021-06-27: 2 mg via INTRAVENOUS

## 2021-06-27 MED ORDER — FENTANYL CITRATE (PF) 250 MCG/5ML IJ SOLN
INTRAMUSCULAR | Status: AC
  Filled 2021-06-27: qty 5

## 2021-06-27 MED ORDER — PHENYLEPHRINE HCL-NACL 20-0.9 MG/250ML-% IV SOLN
INTRAVENOUS | Status: DC | PRN
  Administered 2021-06-27: 50 ug/min via INTRAVENOUS

## 2021-06-27 MED ORDER — BUPIVACAINE-EPINEPHRINE (PF) 0.5% -1:200000 IJ SOLN
INTRAMUSCULAR | Status: DC | PRN
  Administered 2021-06-27: 20 mL via PERINEURAL

## 2021-06-27 MED ORDER — SUCCINYLCHOLINE CHLORIDE 200 MG/10ML IV SOSY
PREFILLED_SYRINGE | INTRAVENOUS | Status: AC
  Filled 2021-06-27: qty 10

## 2021-06-27 MED ORDER — ENOXAPARIN SODIUM 40 MG/0.4ML IJ SOSY
40.0000 mg | PREFILLED_SYRINGE | INTRAMUSCULAR | Status: DC
  Administered 2021-06-28: 40 mg via SUBCUTANEOUS
  Filled 2021-06-27: qty 0.4

## 2021-06-27 MED ORDER — HYDROCORTISONE 10 MG PO TABS
10.0000 mg | ORAL_TABLET | Freq: Every day | ORAL | Status: DC
  Administered 2021-06-27 – 2021-06-28 (×2): 10 mg via ORAL
  Filled 2021-06-27 (×2): qty 1

## 2021-06-27 MED ORDER — ONDANSETRON HCL 4 MG/2ML IJ SOLN
4.0000 mg | Freq: Four times a day (QID) | INTRAMUSCULAR | Status: DC | PRN
  Administered 2021-06-27: 4 mg via INTRAVENOUS
  Filled 2021-06-27: qty 2

## 2021-06-27 MED ORDER — ACETAMINOPHEN 325 MG PO TABS: 650.0000 mg | ORAL_TABLET | Freq: Four times a day (QID) | ORAL | Status: DC | PRN

## 2021-06-27 MED ORDER — CEFAZOLIN IN SODIUM CHLORIDE 3-0.9 GM/100ML-% IV SOLN
3.0000 g | INTRAVENOUS | Status: AC
  Administered 2021-06-27: 3 g via INTRAVENOUS
  Filled 2021-06-27: qty 100

## 2021-06-27 MED ORDER — ONDANSETRON HCL 4 MG/2ML IJ SOLN
INTRAMUSCULAR | Status: AC
  Filled 2021-06-27: qty 2

## 2021-06-27 MED ORDER — ACETAMINOPHEN 650 MG RE SUPP: 650.0000 mg | Freq: Four times a day (QID) | RECTAL | Status: DC | PRN

## 2021-06-27 MED ORDER — SUGAMMADEX SODIUM 200 MG/2ML IV SOLN
INTRAVENOUS | Status: DC | PRN
  Administered 2021-06-27 (×2): 200 mg via INTRAVENOUS

## 2021-06-27 MED ORDER — DOCUSATE SODIUM 100 MG PO CAPS
100.0000 mg | ORAL_CAPSULE | Freq: Two times a day (BID) | ORAL | Status: DC
  Administered 2021-06-27 – 2021-06-28 (×3): 100 mg via ORAL
  Filled 2021-06-27 (×3): qty 1

## 2021-06-27 MED ORDER — PHENYLEPHRINE HCL (PRESSORS) 10 MG/ML IV SOLN
INTRAVENOUS | Status: DC | PRN
  Administered 2021-06-27 (×2): 160 ug via INTRAVENOUS
  Administered 2021-06-27: 80 ug via INTRAVENOUS

## 2021-06-27 MED ORDER — FENTANYL CITRATE (PF) 100 MCG/2ML IJ SOLN
INTRAMUSCULAR | Status: DC | PRN
  Administered 2021-06-27: 50 ug via INTRAVENOUS
  Administered 2021-06-27: 100 ug via INTRAVENOUS

## 2021-06-27 MED ORDER — SODIUM CHLORIDE 0.9 % IV SOLN: INTRAVENOUS | Status: DC

## 2021-06-27 MED ORDER — HYDROCORTISONE 5 MG PO TABS
5.0000 mg | ORAL_TABLET | Freq: Two times a day (BID) | ORAL | Status: DC
  Administered 2021-06-27: 5 mg via ORAL

## 2021-06-27 MED ORDER — ONDANSETRON HCL 4 MG/2ML IJ SOLN
INTRAMUSCULAR | Status: DC | PRN
  Administered 2021-06-27: 4 mg via INTRAVENOUS

## 2021-06-27 MED ORDER — DEXAMETHASONE SODIUM PHOSPHATE 10 MG/ML IJ SOLN
INTRAMUSCULAR | Status: AC
  Filled 2021-06-27: qty 1

## 2021-06-27 MED ORDER — ROCURONIUM BROMIDE 10 MG/ML (PF) SYRINGE
PREFILLED_SYRINGE | INTRAVENOUS | Status: DC | PRN
  Administered 2021-06-27: 20 mg via INTRAVENOUS
  Administered 2021-06-27: 60 mg via INTRAVENOUS

## 2021-06-27 MED ORDER — MORPHINE SULFATE (PF) 2 MG/ML IV SOLN
2.0000 mg | INTRAVENOUS | Status: DC | PRN
  Administered 2021-06-27: 4 mg via INTRAVENOUS
  Filled 2021-06-27: qty 2

## 2021-06-27 MED ORDER — TRAMADOL HCL 50 MG PO TABS
50.0000 mg | ORAL_TABLET | Freq: Four times a day (QID) | ORAL | Status: DC | PRN
  Administered 2021-06-27 – 2021-06-28 (×2): 50 mg via ORAL
  Filled 2021-06-27 (×2): qty 1

## 2021-06-27 MED ORDER — 0.9 % SODIUM CHLORIDE (POUR BTL) OPTIME
TOPICAL | Status: DC | PRN
  Administered 2021-06-27 (×2): 1000 mL

## 2021-06-27 MED ORDER — SODIUM CHLORIDE 0.9 % IV SOLN
12.5000 mg | Freq: Four times a day (QID) | INTRAVENOUS | Status: DC | PRN
  Administered 2021-06-27: 12.5 mg via INTRAVENOUS
  Filled 2021-06-27 (×2): qty 0.5

## 2021-06-27 SURGICAL SUPPLY — 51 items
APL PRP STRL LF DISP 70% ISPRP (MISCELLANEOUS) ×2
APL SKNCLS STERI-STRIP NONHPOA (GAUZE/BANDAGES/DRESSINGS) ×4
APPLIER CLIP 9.375 MED OPEN (MISCELLANEOUS) ×6
APR CLP MED 9.3 20 MLT OPN (MISCELLANEOUS) ×4
BAG COUNTER SPONGE SURGICOUNT (BAG) ×4 IMPLANT
BAG SPNG CNTER NS LX DISP (BAG) ×2
BENZOIN TINCTURE PRP APPL 2/3 (GAUZE/BANDAGES/DRESSINGS) ×2 IMPLANT
BINDER BREAST LRG (GAUZE/BANDAGES/DRESSINGS) IMPLANT
BINDER BREAST XLRG (GAUZE/BANDAGES/DRESSINGS) ×1 IMPLANT
BIOPATCH RED 1 DISK 7.0 (GAUZE/BANDAGES/DRESSINGS) ×9 IMPLANT
CANISTER SUCT 3000ML PPV (MISCELLANEOUS) ×4 IMPLANT
CHLORAPREP W/TINT 26 (MISCELLANEOUS) ×4 IMPLANT
CLIP APPLIE 9.375 MED OPEN (MISCELLANEOUS) ×3 IMPLANT
COVER SURGICAL LIGHT HANDLE (MISCELLANEOUS) ×4 IMPLANT
DRAIN CHANNEL 19F RND (DRAIN) ×9 IMPLANT
DRAPE LAPAROSCOPIC ABDOMINAL (DRAPES) ×4 IMPLANT
DRSG PAD ABDOMINAL 8X10 ST (GAUZE/BANDAGES/DRESSINGS) ×4 IMPLANT
DRSG TEGADERM 4X4.5 CHG (GAUZE/BANDAGES/DRESSINGS) ×8 IMPLANT
DRSG TEGADERM 4X4.75 (GAUZE/BANDAGES/DRESSINGS) ×2 IMPLANT
ELECT BLADE 4.0 EZ CLEAN MEGAD (MISCELLANEOUS) ×3
ELECT CAUTERY BLADE 6.4 (BLADE) IMPLANT
ELECT REM PT RETURN 9FT ADLT (ELECTROSURGICAL) ×3
ELECTRODE BLDE 4.0 EZ CLN MEGD (MISCELLANEOUS) IMPLANT
ELECTRODE REM PT RTRN 9FT ADLT (ELECTROSURGICAL) ×3 IMPLANT
EVACUATOR SILICONE 100CC (DRAIN) ×9 IMPLANT
GAUZE SPONGE 4X4 12PLY STRL (GAUZE/BANDAGES/DRESSINGS) ×5 IMPLANT
GAUZE SPONGE 4X4 12PLY STRL LF (GAUZE/BANDAGES/DRESSINGS) ×4 IMPLANT
GAUZE XEROFORM 1X8 LF (GAUZE/BANDAGES/DRESSINGS) ×8 IMPLANT
GLOVE SURG ENC MOIS LTX SZ7 (GLOVE) ×4 IMPLANT
GLOVE SURG UNDER POLY LF SZ7.5 (GLOVE) ×4 IMPLANT
GOWN STRL REUS W/ TWL LRG LVL3 (GOWN DISPOSABLE) ×6 IMPLANT
GOWN STRL REUS W/TWL LRG LVL3 (GOWN DISPOSABLE) ×6
HEMOSTAT ARISTA ABSORB 3G PWDR (HEMOSTASIS) ×2 IMPLANT
KIT BASIN OR (CUSTOM PROCEDURE TRAY) ×4 IMPLANT
KIT TURNOVER KIT B (KITS) ×4 IMPLANT
NS IRRIG 1000ML POUR BTL (IV SOLUTION) ×4 IMPLANT
PACK GENERAL/GYN (CUSTOM PROCEDURE TRAY) ×4 IMPLANT
PAD ABD 7.5X8 STRL (GAUZE/BANDAGES/DRESSINGS) ×2 IMPLANT
PAD ARMBOARD 7.5X6 YLW CONV (MISCELLANEOUS) ×4 IMPLANT
PENCIL SMOKE EVACUATOR (MISCELLANEOUS) ×4 IMPLANT
PIN SAFETY STERILE (MISCELLANEOUS) ×2 IMPLANT
SPECIMEN JAR X LARGE (MISCELLANEOUS) ×4 IMPLANT
SPONGE T-LAP 18X18 ~~LOC~~+RFID (SPONGE) IMPLANT
STRIP CLOSURE SKIN 1/2X4 (GAUZE/BANDAGES/DRESSINGS) ×3 IMPLANT
SUT ETHILON 2 0 FS 18 (SUTURE) ×9 IMPLANT
SUT ETHILON 3 0 FSL (SUTURE) ×4 IMPLANT
SUT MNCRL AB 4-0 PS2 18 (SUTURE) ×5 IMPLANT
SUT SILK 2 0 SH (SUTURE) ×4 IMPLANT
SUT VIC AB 3-0 SH 18 (SUTURE) ×4 IMPLANT
TOWEL GREEN STERILE (TOWEL DISPOSABLE) ×4 IMPLANT
TOWEL GREEN STERILE FF (TOWEL DISPOSABLE) ×4 IMPLANT

## 2021-06-27 NOTE — Anesthesia Postprocedure Evaluation (Signed)
Anesthesia Post Note  Patient: Amber White  Procedure(s) Performed: RIGHT MODIFIED RADICAL MASTECTOMY (Right: Breast) LEFT TOTAL MASTECTOMY (Left: Breast)     Patient location during evaluation: PACU Anesthesia Type: General Level of consciousness: awake Pain management: pain level controlled Vital Signs Assessment: post-procedure vital signs reviewed and stable Cardiovascular status: stable Postop Assessment: no apparent nausea or vomiting Anesthetic complications: no   No notable events documented.  Last Vitals:  Vitals:   06/27/21 1115 06/27/21 1229  BP: (!) 147/97 (!) 162/106  Pulse: 79 78  Resp: 12 15  Temp: 36.9 C (!) 36.4 C  SpO2: 92% 98%    Last Pain:  Vitals:   06/27/21 1229  TempSrc: Oral  PainSc:                  Areeba Sulser

## 2021-06-27 NOTE — Plan of Care (Signed)
  Problem: Clinical Measurements: Goal: Will remain free from infection Outcome: Progressing   Problem: Pain Managment: Goal: General experience of comfort will improve Outcome: Progressing   Problem: Safety: Goal: Ability to remain free from injury will improve Outcome: Progressing   

## 2021-06-27 NOTE — Consult Note (Addendum)
Cardiology Consultation:   Patient ID: Amber White MRN: [id number]; DOB: 11/22/1975  Admit date: 06/27/2021 Date of Consult: 06/27/2021  PCP:  Amber Nasuti, MD   [facility] Providers Cardiologist:  Amber Casino, MD   {    Patient Profile:   Amber White is a 45 y.o. female with a hx of cardiomyopathy, breast cancer, and morbid obesity who is being seen 06/27/2021 for the evaluation of cardiomyopathy and fluid status at the request of Dr. Georgette White.    History of Present Illness:   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   Patient was hospitalized from 05/21/2021 to 05/24/2021, during which she was diagnosed with cardiomyopathy, likely related to her course of chemotherapy. Patient reported that she had been short of breath for 2 weeks, denied chest pain. BNP 35.8. HSTN negative. Echocardiogram showed a LVEF of 50% (down from 70-75% on 11/19/20). Left ventricle demonstrated global hypokinesis. LV diastolic parameters were consistent with Grade I diastolic dysfunction. Patient was sent home on bisoprolol 10 mg daily, Lasix 40 mg PRN for weight gain.  Patient reports that she has continued to have some SOB. Denies SOB at this time. Denies swelling in her feet/lower extremities. Reports that she does not feel like she is holding fluid at this time.     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 Authorizing Provider  ADDERALL XR 30 MG 24 hr capsule Take 30 mg by mouth every morning. 03/19/21  Yes [provider]  bisoprolol (ZEBETA) 10 MG tablet Take 1 tablet (10 mg total) by mouth daily. 06/03/21  Yes Amber Lose, MD  hydrocortisone (CORTEF) 5 MG tablet Take 2 tablets (10 mg total) by mouth in the morning AND 1 tablet (5 mg total) daily before supper. 06/03/21  Yes Amber Lose, MD  omeprazole (PRILOSEC) 40 MG capsule TAKE 1 CAPSULE (40 MG TOTAL) BY MOUTH DAILY. Patient taking differently: Take 40 mg by mouth daily as needed (heartburn). 05/13/21  Yes Amber Lose, MD  gabapentin (NEURONTIN) 100 MG capsule Take 3 capsules (300 mg total) by mouth at bedtime. Patient not taking: Reported on 05/21/2021 04/25/21   Amber Lose, MD  OVER THE COUNTER MEDICATION Take 1 tablet by mouth daily. Amberen    [provider]  prochlorperazine (COMPAZINE) 10 MG tablet Take 1 tablet (10 mg total) by mouth every 6 (six) hours as needed (Nausea or vomiting). 11/07/20 04/25/21  Amber Lose, MD    Inpatient Medications: Scheduled Meds:  amphetamine-dextroamphetamine  30 mg Oral q morning   [START ON 06/28/2021] bisoprolol  10 mg Oral Daily   docusate sodium  100 mg Oral BID   [START ON 06/28/2021] enoxaparin (LOVENOX) injection  40 mg  Subcutaneous Q24H   hydrocortisone  10 mg Oral Daily   And   hydrocortisone  5 mg Oral QHS   Continuous Infusions:  sodium chloride 50 mL/hr at 06/27/21 1304   PRN Meds: acetaminophen **OR** acetaminophen, diphenhydrAMINE **OR** diphenhydrAMINE, methocarbamol, morphine injection, ondansetron **OR** ondansetron (ZOFRAN) IV, oxyCODONE, traMADol  Allergies:    Allergies  Allergen Reactions   Paclitaxel Nausea Only and Other (See Comments)     Complained of chest burning and nausea. Resolved with famotidine, methylprednisolone, and diphenhydramine. Resumed paclitaxel and completed without further incident.    Penicillins Hives    Social History:   Social History   Socioeconomic History   Marital status: Single    Spouse name: Not on file   Number of children: 4   Years of education: Not on file   Highest education level: Not on file  Occupational History   Not on file  Tobacco Use   Smoking status: Never   Smokeless tobacco: Never  Vaping Use   Vaping Use: Never used  Substance and Sexual Activity   Alcohol use: Yes    Comment: rare   Drug use: No   Sexual activity: Yes    Birth control/protection: None  Other Topics Concern   Not on file  Social History Narrative   Not on file   Social Determinants of Health   Financial Resource Strain: Not on file  Food Insecurity: Not on file  Transportation Needs: Not on file  Physical Activity: Not on file  Stress: Not on file  Social Connections: Not on file  Intimate Partner Violence: Not on file    Family History:    Family History  Problem Relation Age of Onset   Breast cancer Mother    Diabetes Maternal Uncle    Diabetes Maternal Grandmother    Heart disease Maternal Grandmother      ROS:  Please see the history of present illness.   All other ROS reviewed and negative.     Physical Exam/Data:   Vitals:   06/27/21 1045 06/27/21 1100 06/27/21 1115 06/27/21 1229  BP: (!) 158/96 (!) 145/96 (!) 147/97 (!) 162/106  Pulse: 79 80 79 78  Resp: 15 15 12 15   Temp:   98.4 F (36.9 C) (!) 97.5 F (36.4 C)  TempSrc:    Oral  SpO2: 97% 97% 92% 98%  Weight:      Height:        Intake/Output Summary (Last 24 hours) at 06/27/2021 1529 Last data filed at 06/27/2021 1022 Gross per 24 hour  Intake 1400 ml  Output 50 ml  Net 1350 ml   Last 3 Weights 06/27/2021 06/25/2021 05/21/2021  Weight (lbs) 274 lb 9.6 oz 274 lb 9.6 oz 279 lb  Weight (kg) 124.558 kg  124.558 kg 126.554 kg     Body mass index is 47.14 kg/m.  General:  Well nourished, well developed, somnolent  HEENT: normal Vascular: Distal radial pulses 2+ bilaterally Cardiac:  normal S1, S2; RRR; no murmur over aortic and pulmonary valves. Bandages over chest limited the exam.  Lungs:  clear to auscultation bilaterally, no wheezing, rhonchi or rales. Ext: no edema Musculoskeletal:  No deformities  Skin: warm and dry  Neuro:  CNs 2-12 intact, no focal abnormalities noted Psych:  Normal affect    Relevant CV Studies:  Echo 05/22/21 1. Left ventricular ejection fraction, by estimation, is 50%. The left  ventricle has mildly decreased function. The left ventricle demonstrates  global hypokinesis. Left ventricular diastolic  parameters are consistent  with Grade I diastolic dysfunction  (impaired relaxation). The average left ventricular global longitudinal  strain is -15.7 %. The global longitudinal strain is abnormal.   2. Right ventricular systolic function is normal. The right ventricular  size is normal. There is normal pulmonary artery systolic pressure. The  estimated right ventricular systolic pressure is 40.9 mmHg.   3. The mitral valve is normal in structure. Trivial mitral valve  regurgitation. No evidence of mitral stenosis.   4. The aortic valve is tricuspid. Aortic valve regurgitation is not  visualized. No aortic stenosis is present.   5. The inferior vena cava is normal in size with greater than 50%  respiratory variability, suggesting right atrial pressure of 3 mmHg.   Laboratory Data:  High Sensitivity Troponin:  No results for input(s): TROPONINIHS in the last 720 hours.   Chemistry Recent Labs  Lab 06/25/21 0935  NA 137  K 3.9  CL 105  CO2 24  GLUCOSE 105*  BUN 8  CREATININE 0.71  CALCIUM 9.4  GFRNONAA >60  ANIONGAP 8    No results for input(s): PROT, ALBUMIN, AST, ALT, ALKPHOS, BILITOT in the last 168 hours. Lipids No results for input(s):  CHOL, TRIG, HDL, LABVLDL, LDLCALC, CHOLHDL in the last 168 hours.  Hematology Recent Labs  Lab 06/25/21 0935  WBC 6.2  RBC 3.49*  HGB 10.7*  HCT 33.5*  MCV 96.0  MCH 30.7  MCHC 31.9  RDW 14.0  PLT 287   Thyroid No results for input(s): TSH, FREET4 in the last 168 hours.  BNPNo results for input(s): BNP, PROBNP in the last 168 hours.  DDimer No results for input(s): DDIMER in the last 168 hours.   Radiology/Studies:  No results found.   Assessment and Plan:   Cardiomyopathy: likely caused by chemotherapy medications, patient on bisoprolol 10 mg daily and lasix 40mg  PRN for weight gain.  - Patient reports that she has continued to be SOB. Was originally scheduled for an outpatient visit today, but had to reschedule due to surgery  - Continue current management until outpatient follow up  - Recommend echo at outpatient visit   Risk Assessment/Risk Scores:  {      For questions or updates, please contact [place] Please consult [email or web address] for contact info under    Signed, Margie Billet, PA-C  06/27/2021 3:29 PM   Patient seen and examined.  Agree with below documentation.  Mr. Vallandingham is a 45 year old female with a history of breast cancer, morbid obesity who we are consulted for evaluation of cardiomyopathy at the request of Dr. Georgette White.  She was hospitalized in November 2022 for shortness of breath.  Echocardiogram at that time showed EF 50% with global hypokinesis, decreased from 70 to 75% in May 2022.  This was thought to be secondary to her chemotherapy.  She was discharged on bisoprolol 10 mg daily and Lasix 40 mg daily as needed.  She presented today for bilateral mastectomy.  Tolerated procedure well.  On exam, patient is alert and oriented, regular rate and rhythm, no murmurs, lungs CTAB, no LE edema or JVD.  She appears euvolemic on exam.  No indication for diuresis at this time.  Would continue home bisoprolol, as BP has been elevated.  Would plan for  repeat echocardiogram at follow-up appointment in February to monitor EF.   Donato Heinz, MD

## 2021-06-27 NOTE — Anesthesia Procedure Notes (Addendum)
Anesthesia Regional Block: Pectoralis block   Pre-Anesthetic Checklist: , timeout performed,  Correct Patient, Correct Site, Correct Laterality,  Correct Procedure, Correct Position, site marked,  Risks and benefits discussed,  Surgical consent,  Pre-op evaluation,  At surgeon's request and post-op pain management  Laterality: Left  Prep: chloraprep       Needles:  Injection technique: Single-shot      Additional Needles:   Procedures: Doppler guided,,,, ultrasound used (permanent image in chart),,    Narrative:  Start time: 06/27/2021 7:15 AM End time: 06/27/2021 7:25 AM Injection made incrementally with aspirations every 5 mL.  Performed by: Personally  Anesthesiologist: Belinda Block, MD

## 2021-06-27 NOTE — Op Note (Signed)
Preop diagnosis: Invasive ductal carcinoma right breast with axillary metastases status post neoadjuvant chemotherapy  Postop diagnosis: Same Procedure performed: Right modified radical mastectomy, left prophylactic mastectomy Surgeon:Tavyn Kurka K Rolondo Pierre Assistant: Pryor Curia, RNFA Anesthesia: General, bilateral pectoralis blocks Indications:  This is a 45 year old female with a long history of axillary hidradenitis who presented in early 2022 with 2 months of enlarging firmness in the right upper outer quadrant of her breast with some associated skin thickening.  She initially thought that this was part of her hidradenitis.  She finally sought attention for the breast mass.  Imaging showed an 8.2 cm mass in the right upper outer quadrant with overlying skin thickening.  She had bulky right axillary lymphadenopathy.  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  One of the lymph nodes was also biopsied and revealed metastatic carcinoma.     Her mother had breast cancer about 20 years ago.     We placed a port on 11/21/2020.  She underwent neoadjuvant chemotherapy.  This had to be stopped early because of peripheral neuropathy and cardiac issues.  MRI was performed on 05/17/2021.  This showed near complete response on the right.  Previously the area of enhancement measured 10.2 x 4.9 x 5.0 cm.  Currently there is a single remaining area of enhancement measuring 4.5 mm.  The lymph nodes on the right side have returned to normal size.  Previously there were 7-8 markedly abnormal lymph nodes.  She presents now for modified radical mastectomy.  She also will undergo prophylactic left mastectomy.  Description of procedure: The patient is brought to the operating room and placed in the supine position on the operating room table.  After an adequate level of general anesthesia was obtained, her entire chest was prepped  with ChloraPrep and draped in sterile fashion.  The patient has evidence of previous hidradenitis in both axilla and both inframammary creases.  There is no active infection noted but there is a small open wound in the right inframammary crease.  No purulence is noted.  We began on the left side for the prophylactic mastectomy.  I made an elliptical incision around the nipple areolar complex.  We raised skin flaps superiorly to the infraclavicular chest wall.  Inferiorly, we dissected down to the inframammary crease.  Medially we dissected to the edge of the sternum.  Our lateral border was the anterior edge of the latissimus.  We dissected the breast off of the underlying chest wall from medial to lateral.  We amputated the breast leaving the axillary tail up in the axilla.  There are no palpable nodes in the axilla.  The breast tissue was oriented with a long suture lateral and a short suture superior.  We irrigated and inspected carefully for hemostasis.  A moist sponge was packed into the wound.  We turned our attention to the right side.  I outlined a similar elliptical incision.  We raised skin flaps using similar landmarks.  We then dissected from medial to lateral taking the breast off of the chest wall.  We continued our dissection up into the axilla.  The long thoracic nerve was identified.  We identified the thoracodorsal bundle.  I bluntly dissected until we could see the axillary vein.  We then excised all of the lymph nodes that we could identify as a single specimen along with the breast.  This was sent for pathologic examination  with a long suture lateral and a short stitch superior we inspected for hemostasis.  The axillary vein is intact with no sign of bleeding.  The landmark neurovascular bundles are also identified and preserved.  We irrigated the wound thoroughly.  We inspected both sides.  Arista powder was sprayed on both sides.  I used 2 drains on the right side and one drain on the  left.  We then closed the wound with multiple deep 3-0 Vicryl sutures.  4-0 Monocryl was used to close the skin.  Benzoin and Steri-Strips were applied.  The drains were secured with 2-0 nylon sutures.  The patient was then extubated and brought to the recovery room in stable condition.  All sponge, instrument, and needle counts are correct.  Imogene Burn. Georgette Dover, MD, [facility] Surgery  General Surgery   06/27/2021 10:27 AM

## 2021-06-27 NOTE — Transfer of Care (Signed)
Immediate Anesthesia Transfer of Care Note  Patient: Amber White  Procedure(s) Performed: RIGHT MODIFIED RADICAL MASTECTOMY (Right: Breast) LEFT TOTAL MASTECTOMY (Left: Breast)  Patient Location: PACU  Anesthesia Type:General and Regional  Level of Consciousness: drowsy, patient cooperative and responds to stimulation  Airway & Oxygen Therapy: Patient Spontanous Breathing and Patient connected to nasal cannula oxygen  Post-op Assessment: Report given to RN and Post -op Vital signs reviewed and stable  Post vital signs: Reviewed and stable  Last Vitals:  Vitals Value Taken Time  BP 155/99 06/27/21 1032  Temp    Pulse 80 06/27/21 1032  Resp 16 06/27/21 1032  SpO2 98 % 06/27/21 1032  Vitals shown include unvalidated device data.  Last Pain:  Vitals:   06/27/21 0641  TempSrc:   PainSc: 0-No pain         Complications: No notable events documented.

## 2021-06-27 NOTE — H&P (Signed)
Subjective    Chief Complaint: Follow-up (Right breast cancer)       History of Present Illness: Amber White is a 45 y.o. female who is seen today as an office consultation at the request of Amber White for evaluation of Follow-up (Right breast cancer) .     Breast [facility]DC 11/07/20 Amber White   PCP - Dr. London White   This is a 45 year old female with a long history of axillary hidradenitis who presents with 2 months of enlarging firmness in the right upper outer quadrant of her breast with some associated skin thickening.  She initially thought that this was part of her hidradenitis.  She finally sought attention for the breast mass.  Imaging showed an 8.2 cm mass in the right upper outer quadrant with overlying skin thickening.  She had bulky right axillary lymphadenopathy.  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  One of the lymph nodes was also biopsied and revealed metastatic carcinoma.     Her mother had breast cancer about 20 years ago.     We placed a port on 11/21/2020.  She underwent neoadjuvant chemotherapy.  This had to be stopped early because of peripheral neuropathy and cardiac issues.  MRI was performed on 05/17/2021.  This showed near complete response on the right.  Previously the area of enhancement measured 10.2 x 4.9 x 5.0 cm.  Currently there is a single remaining area of enhancement measuring 4.5 mm.  The lymph nodes on the right side have returned to normal size.  Previously there were 7-8 markedly abnormal lymph nodes.     Review of Systems: A complete review of systems was obtained from the patient.  I have reviewed this information and discussed as appropriate with the patient.  See HPI as well for other ROS.   Review of Systems  Constitutional: Negative.   HENT: Negative.   Eyes: Negative.   Respiratory: Negative.   Cardiovascular: Negative.   Gastrointestinal: Negative.    Genitourinary: Negative.   Musculoskeletal: Negative.   Skin: Negative.   Neurological: Positive for tingling and sensory change.  Endo/Heme/Allergies: Negative.   Psychiatric/Behavioral: Negative.         Medical History: Past Medical History         Past Medical History:  Diagnosis Date   History of cancer               Patient Active Problem List  Diagnosis   Malignant neoplasm of upper-outer quadrant of right breast in female, estrogen receptor negative (CMS-HCC)   Morbid obesity (CMS-HCC)   Port-A-Cath in place      Past Surgical History           Past Surgical History:  Procedure Laterality Date   LAPAROSCOPIC CHOLECYSTECTOMY            Allergies           Allergies  Allergen Reactions   Paclitaxel Nausea and Other (See Comments)      Complained of chest burning and nausea. Resolved with famotidine, methylprednisolone, and diphenhydramine. Resumed paclitaxel and completed without further incident.    Penicillins Hives                   Current Outpatient Medications on File Prior to Visit  Medication Sig Dispense Refill   dextroamphetamine-amphetamine (ADDERALL XR) 30 MG XR capsule Take 1 capsule (30 mg total) by mouth  every morning       metoprolol succinate (TOPROL-XL) 25 MG XL tablet Take 1 tablet (25 mg total) by mouth once daily        No current facility-administered medications on file prior to visit.      Family History           Family History  Problem Relation Age of Onset   Breast cancer Mother          Social History         Tobacco Use  Smoking Status Never  Smokeless Tobacco Never      Social History  Social History             Socioeconomic History   Marital status: Unknown  Tobacco Use   Smoking status: Never   Smokeless tobacco: Never  Substance and Sexual Activity   Alcohol use: Yes      Comment: occasionally   Drug use: Never        Objective:           Vitals:      BP: (!) 140/80  Pulse: (!) 153   Temp: 36.7 C (98 F)  SpO2: 100%  Weight: (!) 129.8 kg (286 lb 3.2 oz)  Height: 162.6 cm ('5\' 4"' )    Body mass index is 49.13 kg/m.   Physical Exam    Constitutional: WDWN in NAD, conversant, no obvious deformities; resting comfortably Eyes: Pupils equal, round; sclera anicteric; moist conjunctiva; no lid lag HENT: Oral mucosa moist; good dentition Neck: No masses palpated, trachea midline; no thyromegaly Lungs: CTA bilaterally; normal respiratory effort Right chest port site WNL Breasts: left breast shows no palpable masses, no nipple changes or discharge; no axillary lymphadenopathy; axillary and bilateral inframammary hidradenitis Right breast shows resolution of the large firm palpable mass in the upper outer quadrant; no palpable lymph nodes;  no nipple changes or discharge CV: Regular rate and rhythm; no murmurs; extremities well-perfused with no edema Abd: +bowel sounds, obese, soft, non-tender, no palpable organomegaly; no palpable hernias Musc: Normal gait; no apparent clubbing or cyanosis in extremities Lymphatic: No palpable cervical or axillary lymphadenopathy Skin: Warm, dry; no sign of jaundice Psychiatric - alert and oriented x 4; calm mood and affect       Labs, Imaging and Diagnostic Testing: CLINICAL DATA:  Follow-up known breast cancer. Assess treatment response.   LABS:  None   EXAM: BILATERAL BREAST MRI WITH AND WITHOUT CONTRAST   TECHNIQUE: Multiplanar, multisequence MR images of both breasts were obtained prior to and following the intravenous administration of 10 ml of Gadavist   Three-dimensional MR images were rendered by post-processing of the original MR data on an independent workstation. The three-dimensional MR images were interpreted, and findings are reported in the following complete MRI report for this study. Three dimensional images were evaluated at the independent interpreting workstation using the DynaCAD thin client.    COMPARISON:  Nov 11, 2020 breast MRI   FINDINGS: Breast composition: c. Heterogeneous fibroglandular tissue.   Background parenchymal enhancement: Mild   Right breast: The diffuse abnormal masslike and non mass enhancement throughout the right breast measuring 10.2 x 4.9 x 5.0 cm on the previous study has almost completely resolved in the interval. There is a single focus of enhancement remaining measuring 4.5 mm on series 6, image 73. No other abnormal enhancement is identified in the right breast. The enhancement of the nipple and adjacent skin has resolved. The enhancement in the skin of the  medial right breast has resolved. Skin thickening has improved but remains.   Left breast: No suspicious masses or non mass enhancement are seen in the left breast. There is mild skin based enhancement in the inferior left breast seen on series 6, image 126 best seen on DynaCAD. The patient has 5 left intramammary lymph nodes. The left intramammary lymph node on series 6, image 72 is larger in the interval. The intramammary lymph node on series 6, image 88 is larger in the interval. The other intramammary nodes are stable.   Lymph nodes: Left intramammary lymph nodes as above. No axillary adenopathy identified bilaterally. The abnormal nodes on the right have returned to normal size.   Ancillary findings:  None.   IMPRESSION: 1. Near complete response on the right. The 10.2 x 4.9 x 5.0 cm masslike and non masslike enhancement in the right breast on the previous study has almost completely resolved. A single remaining focus of enhancement measuring 4.5 mm remains on series 6, image 73. The finding is not specific but a tiny amount of residual malignancy in this region is not excluded on this study. 2. Mildly enhancing skin thickening in the inferior left breast, likely inflammatory or infectious. Recommend clinical correlation. 3. Two intramammary lymph nodes on the left are larger in  the interval. Given the skin based process, reactive nodes are favored. This could be further evaluated with ultrasound and close follow-up.   RECOMMENDATION: Recommend continued surgical and oncologic follow up. Recommend clinical correlation of the inferior left breast as there appears to be a skin based lesion in this region, likely inflammatory infectious. Short-term follow-up of the left intramammary nodes is recommended to ensure return to normal.   BI-RADS CATEGORY  6: Known biopsy-proven malignancy.     Electronically Signed   By: Dorise Bullion III M.D.   On: 05/17/2021 17:49     Assessment and Plan:  Diagnoses and all orders for this visit:   Invasive ductal carcinoma of breast, female, right (CMS-HCC)   Stage IIIc   Although the patient has had an excellent response to neoadjuvant chemotherapy, her initial presentation showed a large number of positive lymph nodes as well as a very large primary tumor with skin involvement.  We continue to recommend mastectomy with axillary lymph node dissection (modified radical mastectomy).  She will need radiation after surgery.  She may elect to consider delayed reconstruction.  The patient also would like prophylactic left mastectomy.     We will plan a right modified radical mastectomy and prophylactic left mastectomy.  The surgical procedure has been discussed with the patient.  Potential risks, benefits, alternative treatments, and expected outcomes have been explained.  All of the patient's questions at this time have been answered.  The likelihood of reaching the patient's treatment goal is good.  The patient understand the proposed surgical procedure and wishes to proceed.  Imogene Burn. Georgette Dover, MD, [facility]  General Surgery   06/27/2021 7:27 AM

## 2021-06-27 NOTE — Anesthesia Procedure Notes (Addendum)
°  Anesthesia Regional Block: Pectoralis block   Pre-Anesthetic Checklist: , timeout performed,  Correct Patient, Correct Site, Correct Laterality,  Correct Procedure, Correct Position, site marked,  Risks and benefits discussed,  Surgical consent,  Pre-op evaluation,  At surgeon's request and post-op pain management  Laterality: Right  Prep: chloraprep       Needles:  Injection technique: Single-shot  Needle Type: Echogenic Stimulator Needle          Additional Needles:   Procedures: Doppler guided,,,, ultrasound used (permanent image in chart),,    Narrative:  Start time: 06/27/2021 7:00 AM End time: 06/27/2021 7:15 AM Injection made incrementally with aspirations every 5 mL. Anesthesiologist: Belinda Block, MD

## 2021-06-27 NOTE — Anesthesia Procedure Notes (Signed)
Procedure Name: Intubation Date/Time: 06/27/2021 7:43 AM Performed by: Cathren Harsh, CRNA Pre-anesthesia Checklist: Patient identified, Emergency Drugs available, Suction available and Patient being monitored Patient Re-evaluated:Patient Re-evaluated prior to induction Oxygen Delivery Method: Circle System Utilized Preoxygenation: Pre-oxygenation with 100% oxygen Induction Type: IV induction Laryngoscope Size: Mac and 3 Grade View: Grade I Tube type: Oral Tube size: 7.0 mm Number of attempts: 1 Airway Equipment and Method: Stylet and Oral airway Placement Confirmation: ETT inserted through vocal cords under direct vision, positive ETCO2 and breath sounds checked- equal and bilateral Secured at: 21 cm Tube secured with: Tape Dental Injury: Teeth and Oropharynx as per pre-operative assessment  Comments: Mask ventilation NOT attempted.

## 2021-06-28 ENCOUNTER — Encounter (HOSPITAL_COMMUNITY): Payer: Self-pay | Admitting: Surgery

## 2021-06-28 DIAGNOSIS — Z4001 Encounter for prophylactic removal of breast: Secondary | ICD-10-CM | POA: Diagnosis not present

## 2021-06-28 LAB — CBC
HCT: 30.9 % — ABNORMAL LOW (ref 36.0–46.0)
Hemoglobin: 9.9 g/dL — ABNORMAL LOW (ref 12.0–15.0)
MCH: 30.3 pg (ref 26.0–34.0)
MCHC: 32 g/dL (ref 30.0–36.0)
MCV: 94.5 fL (ref 80.0–100.0)
Platelets: 299 10*3/uL (ref 150–400)
RBC: 3.27 MIL/uL — ABNORMAL LOW (ref 3.87–5.11)
RDW: 13.7 % (ref 11.5–15.5)
WBC: 7.4 10*3/uL (ref 4.0–10.5)
nRBC: 0 % (ref 0.0–0.2)

## 2021-06-28 LAB — BASIC METABOLIC PANEL
Anion gap: 9 (ref 5–15)
BUN: 8 mg/dL (ref 6–20)
CO2: 20 mmol/L — ABNORMAL LOW (ref 22–32)
Calcium: 8.8 mg/dL — ABNORMAL LOW (ref 8.9–10.3)
Chloride: 106 mmol/L (ref 98–111)
Creatinine, Ser: 0.81 mg/dL (ref 0.44–1.00)
GFR, Estimated: >60 mL/min (ref >60)
Glucose, Bld: 112 mg/dL — ABNORMAL HIGH (ref 70–99)
Potassium: 3.8 mmol/L (ref 3.5–5.1)
Sodium: 135 mmol/L (ref 135–145)

## 2021-06-28 MED ORDER — OXYCODONE HCL 5 MG PO TABS: 5.0000 mg | ORAL_TABLET | Freq: Four times a day (QID) | ORAL | 0 refills | Status: DC | PRN

## 2021-06-28 NOTE — Discharge Instructions (Signed)
[facility] [phone]  MASTECTOMY: POST OP INSTRUCTIONS  Always review your discharge instruction sheet given to you by the facility where your surgery was performed. IF YOU HAVE DISABILITY OR FAMILY LEAVE FORMS, YOU MUST BRING THEM TO THE OFFICE FOR PROCESSING.   DO NOT GIVE THEM TO YOUR DOCTOR. A prescription for pain medication may be given to you upon discharge.  Take your pain medication as prescribed, if needed.  If narcotic pain medicine is not needed, then you may take acetaminophen (Tylenol) or ibuprofen (Advil) as needed. Take your usually prescribed medications unless otherwise directed. If you need a refill on your pain medication, please contact your pharmacy.  They will contact our office to request authorization.  Prescriptions will not be filled after 5pm or on week-ends. You should follow a light diet the first few days after arrival home, such as soup and crackers, etc.  Resume your normal diet the day after surgery. Most patients will experience some swelling and bruising on the chest and underarm.  Ice packs will help.  Swelling and bruising can take several days to resolve.  It is common to experience some constipation if taking pain medication after surgery.  Increasing fluid intake and taking a stool softener (such as Colace) will usually help or prevent this problem from occurring.  A mild laxative (Milk of Magnesia or Miralax) should be taken according to package instructions if there are no bowel movements after 48 hours. Unless discharge instructions indicate otherwise, leave your bandage dry and in place until your next appointment in 3-5 days.  You may take a limited sponge bath.  No tube baths or showers until the drains are removed.  You may have steri-strips (small skin tapes) in place directly over the incision.  These strips should be left on the skin for 7-10 days.  If your surgeon used skin glue on the incision, you may shower in 24 hours.   The glue will flake off over the next 2-3 weeks.  Any sutures or staples will be removed at the office during your follow-up visit. DRAINS:  If you have drains in place, it is important to keep a list of the amount of drainage produced each day in your drains.  Before leaving the hospital, you should be instructed on drain care.  Call our office if you have any questions about your drains. ACTIVITIES:  You may resume regular (light) daily activities beginning the next day--such as daily self-care, walking, climbing stairs--gradually increasing activities as tolerated.  You may have sexual intercourse when it is comfortable.  Refrain from any heavy lifting or straining until approved by your doctor. You may drive when you are no longer taking prescription pain medication, you can comfortably wear a seatbelt, and you can safely maneuver your car and apply brakes. RETURN TO WORK:  __________________________________________________________ You should see your doctor in the office for a follow-up appointment approximately 3-5 days after your surgery.  Your doctor's nurse will typically make your follow-up appointment when she calls you with your pathology report.  Expect your pathology report 2-3 business days after your surgery.  You may call to check if you do not hear from us after three days.   OTHER INSTRUCTIONS: ______________________________________________________________________________________________ ____________________________________________________________________________________________ WHEN TO CALL YOUR DOCTOR: Fever over 101.0 Nausea and/or vomiting Extreme swelling or bruising Continued bleeding from incision. Increased pain, redness, or drainage from the incision. The clinic staff is available to answer your questions during regular business hours.  Please don't hesitate   to call and ask to speak to one of the nurses for clinical concerns.  If you have a medical emergency, go to the  nearest emergency room or call 911.  A surgeon from [facility] is always on call at the hospital. [address], [address], [place], [place]  [address] ? [address] [address], [place], [place]   [address] [phone] ? [phone] ? FAX [phone] Web site: [email or web address]  

## 2021-06-28 NOTE — TOC Transition Note (Signed)
Transition of Care [facility]) - CM/SW Discharge Note   Patient Details  Name: Amber White MRN: [id number] Date of Birth: [birth year]  Transition of Care [facility]) CM/SW Contact:  Marilu Favre, RN Phone Number: 06/28/2021, 11:26 AM   Clinical Narrative:      Bedside nurse provided post op and drain care education.  Transition of Care Department [facility]) has reviewed patient and no TOC needs have been identified at this time. We will continue to monitor patient advancement through interdisciplinary progression rounds. If new patient transition needs arise , please place a TOC consult       Patient Goals and CMS Choice        Discharge Placement                       Discharge Plan and Services                                     Social Determinants of Health (SDOH) Interventions     Readmission Risk Interventions No flowsheet data found.

## 2021-06-28 NOTE — Discharge Planning (Signed)
Patient discharged home in stable condition. Verbalizes understanding of all discharge instructions, including home medications, JP drain care and documentation, and follow up appointments.

## 2021-06-28 NOTE — Discharge Summary (Signed)
Physician Discharge Summary  Patient ID: Amber White MRN: [id number] DOB/AGE: [birth year] 45 y.o.  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 She did well with surgery with good pain control.  No cardiac issues overnight.  Hgb stable.  Ready for discharge on POD #1.  Consults: cardiology  Treatments: surgery: Right modified radical mastectomy/ left prophylactic mastectomy on 06/27/21.  Discharge Exam: Blood pressure (!) 154/97, pulse (!) 102, temperature 98.3 F (36.8 C), temperature source Oral, resp. rate 16, height 5\' 4"  (1.626 m), weight 124.6 kg, SpO2 100 %. WDWN in NAD Mastectomy sites - skin flaps viable; no hematoma Drains serosanguinous Moderate ROM of both shoulders - limited by discomfort  Disposition: Discharge disposition: 01-Home or Self Care        Allergies as of 06/28/2021       Reactions   Paclitaxel Nausea Only, Other (See Comments)   Complained of chest burning and nausea. Resolved with famotidine, methylprednisolone, and diphenhydramine. Resumed paclitaxel and completed without further incident.    Penicillins Hives        Medication List     TAKE these medications    Adderall XR 30 MG 24 hr capsule Generic drug: amphetamine-dextroamphetamine Take 30 mg by mouth every morning.   bisoprolol 10 MG tablet Commonly known as: ZEBETA Take 1 tablet (10 mg total) by mouth daily.   gabapentin 100 MG capsule Commonly known as: NEURONTIN Take 3 capsules (300 mg total) by mouth at bedtime.   hydrocortisone 5 MG tablet Commonly known as: Cortef Take 2 tablets (10 mg  total) by mouth in the morning AND 1 tablet (5 mg total) daily before supper.   omeprazole 40 MG capsule Commonly known as: PRILOSEC TAKE 1 CAPSULE (40 MG TOTAL) BY MOUTH DAILY. What changed:  when to take this reasons to take this   OVER THE COUNTER MEDICATION Take 1 tablet by mouth daily. Amberen   oxyCODONE 5 MG immediate release tablet Commonly known as: Oxy IR/ROXICODONE Take 1 tablet (5 mg total) by mouth every 6 (six) hours as needed for moderate pain.        Follow-up Information     Donnie Mesa, MD Follow up in 1 week(s).   Specialty: General Surgery Contact information: [address] [place] [address] [phone]                 Signed: Maia Petties 06/28/2021, 9:00 AM

## 2021-07-02 ENCOUNTER — Encounter: Payer: Self-pay | Admitting: *Deleted

## 2021-07-04 ENCOUNTER — Encounter: Payer: Self-pay | Admitting: *Deleted

## 2021-07-11 NOTE — Progress Notes (Signed)
Patient Care Team: Hague, Rosalyn Charters, MD as PCP - General (Internal Medicine) Debara Pickett Nadean Corwin, MD as PCP - Cardiology (Cardiology) Rockwell Germany, RN as Oncology Nurse Navigator Mauro Kaufmann, RN as Oncology Nurse Navigator Donnie Mesa, MD as Consulting Physician (General Surgery) Nicholas Lose, MD as Consulting Physician (Hematology and Oncology) Gery Pray, MD as Consulting Physician (Radiation Oncology)  DIAGNOSIS:    ICD-10-CM   1. Malignant neoplasm of upper-outer quadrant of right breast in female, estrogen receptor negative ([place])  C50.411    Z17.1       SUMMARY OF ONCOLOGIC HISTORY: Oncology History  Malignant neoplasm of upper-outer quadrant of right breast in female, estrogen receptor negative ([place])  10/31/2020 Initial Diagnosis   Patient palpated a right breast mass x81mo Diagnostic mammogram and [place]owed an abnormality and calcifications in the right breast, 8.2cm, with skin thickening and right axillary adenopathy. 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   11/07/2020 Cancer Staging   Staging form: Breast, AJCC 8th Edition - Clinical: Stage IIIC (cT4, cN1, cM0, G3, ER-, PR-, HER2: Equivocal) - Signed by GNicholas Lose MD on 11/07/2020 Histologic grading system: 3 grade system    11/22/2020 -  Chemotherapy   Patient is on Treatment Plan: BREAST PEMBROLIZUMAB + AC Q21D X 4 CYCLES FOLLOWED BY PEMBROLIZUMAB + CARBOPLATIN D1 + PACLITAXEL D1,8,15 Q21D X 4 CYCLES        Genetic Testing   Negative genetic testing. No pathogenic variants identified on the [facility]RNA panel. The report date is 11/28/2020.  The CancerNext-Expanded + RNAinsight gene panel offered by [employer] includes sequencing and rearrangement analysis for the following 77 genes: IP, ALK, APC*, ATM*, AXIN2, BAP1, BARD1, BLM, BMPR1A, BRCA1*, BRCA2*, BRIP1*, CDC73, CDH1*,CDK4,  CDKN1B, CDKN2A, CHEK2*, CTNNA1, DICER1, FANCC, FH, FLCN, GALNT12, KIF1B, LZTR1, MAX, MEN1, MET, MLH1*, MSH2*, MSH3, MSH6*, MUTYH*, NBN, NF1*, NF2, NTHL1, PALB2*, PHOX2B, PMS2*, POT1, PRKAR1A, PTCH1, PTEN*, RAD51C*, RAD51D*,RB1, RECQL, RET, SDHA, SDHAF2, SDHB, SDHC, SDHD, SMAD4, SMARCA4, SMARCB1, SMARCE1, STK11, SUFU, TMEM127, TP53*,TSC1, TSC2, VHL and XRCC2 (sequencing and deletion/duplication); EGFR, EGLN1, HOXB13, KIT, MITF, PDGFRA, POLD1 and POLE (sequencing only); EPCAM and GREM1 (deletion/duplication only).     CHIEF COMPLIANT: Follow-up since recent surgery  INTERVAL HISTORY: Amber Dicensois a 45y.o. with above-mentioned history of breast cancer who received neoadjuvant chemotherapy which was discontinued early because of worsening peripheral neuropathy.  She had adrenal insufficiency diagnosis when she was in the hospital.  Because of this we discontinued immunotherapy.  She underwent recent surgery and is here today to discuss the final pathology results.  She is recovering and healing from the recent surgery.  ALLERGIES:  is allergic to paclitaxel and penicillins.  MEDICATIONS:  Current Outpatient Medications  Medication Sig Dispense Refill   ADDERALL XR 30 MG 24 hr capsule Take 30 mg by mouth every morning.     bisoprolol (ZEBETA) 10 MG tablet Take 1 tablet (10 mg total) by mouth daily. 30 tablet 0   hydrocortisone (CORTEF) 5 MG tablet Take 2 tablets (10 mg total) by mouth in the morning AND 1 tablet (5 mg total) daily before supper. 90 tablet 1   omeprazole (PRILOSEC) 40 MG capsule TAKE 1 CAPSULE (40 MG TOTAL) BY MOUTH DAILY. (Patient taking differently: Take 40 mg by mouth daily as needed (heartburn).) 90 capsule 1   oxyCODONE (OXY IR/ROXICODONE) 5 MG immediate release tablet Take 1 tablet (5  mg total) by mouth every 6 (six) hours as needed for moderate pain. 30 tablet 0   No current facility-administered medications for this visit.   Facility-Administered Medications Ordered in  Other Visits  Medication Dose Route Frequency Provider Last Rate Last Admin   filgrastim-aafi (NIVESTYM) 480 MCG/0.8ML injection             PHYSICAL EXAMINATION: ECOG PERFORMANCE STATUS: 1 - Symptomatic but completely ambulatory  Vitals:   07/12/21 0900  BP: 119/73  Pulse: 81  Resp: 18  Temp: 97.6 F (36.4 C)  SpO2: 100%   Filed Weights   07/12/21 0900  Weight: 274 lb 9 oz (124.5 kg)    LABORATORY DATA:  I have reviewed the data as listed CMP Latest Ref Rng & Units 06/28/2021 06/25/2021 05/24/2021  Glucose 70 - 99 mg/dL 112(H) 105(H) 126(H)  BUN 6 - 20 mg/dL '8 8 16  ' Creatinine 0.44 - 1.00 mg/dL 0.81 0.71 0.98  Sodium 135 - 145 mmol/L 135 137 137  Potassium 3.5 - 5.1 mmol/L 3.8 3.9 3.2(L)  Chloride 98 - 111 mmol/L 106 105 103  CO2 22 - 32 mmol/L 20(L) 24 25  Calcium 8.9 - 10.3 mg/dL 8.8(L) 9.4 9.2  Total Protein 6.5 - 8.1 g/dL - - -  Total Bilirubin 0.3 - 1.2 mg/dL - - -  Alkaline Phos 38 - 126 U/L - - -  AST 15 - 41 U/L - - -  ALT 0 - 44 U/L - - -    Lab Results  Component Value Date   WBC 7.4 06/28/2021   HGB 9.9 (L) 06/28/2021   HCT 30.9 (L) 06/28/2021   MCV 94.5 06/28/2021   PLT 299 06/28/2021   NEUTROABS 4.2 05/21/2021    ASSESSMENT & PLAN:  Malignant neoplasm of upper-outer quadrant of right breast in female, estrogen receptor negative ([place]) 10/31/20: Patient palpated a right breast mass x15mo Diagnostic mammogram and UKore[place]owed an abnormality and calcifications in the right breast, 8.2cm, with skin thickening and right axillary adenopathy. 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   Treatment plan: 1. Neoadjuvant chemotherapy with Adriamycin and Cytoxan Keytruda 4 followed by Taxol weekly 12 with carboplatin and Keytruda every 3 weeks x4 completed 04/18/2021 followed by 1 year of torted Keytruda maintenance 2. 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 Followed by adjuvant radiation therapy UR CC nausea study CT CAP 11/14/2020: Enlarged right axillary lymph nodes, prominent left axillary lymph node.  (Requires an ultrasound) no evidence of metastatic disease in the abdomen or pelvis Bone scan 11/16/2020: No evidence of bone metastases --------------------------------------------------------------------------------------------------------------------------------------------------------- Pathology counseling: I discussed the final pathology report of the patient provided  a copy of this report. I discussed the margins as well as lymph node surgeries. We also discussed the final staging along with previously performed ER/PR and HER-2/neu testing.  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  Immunotherapy has been discontinued.  MRI brain: 05/24/2021: No focal masses, endocrinology follow-up  Treatment plan: 1.  Adjuvant radiation 2. consideration for adjuvant Xeloda since immunotherapy has been discontinued.  We will keep the port until 6 months to year and then it can be removed if necessary.  Patient is at a higher risk for recurrence and therefore we are leaving the port in  for the time being.  Return to clinic after radiation is complete.  No orders of the defined types were placed in this encounter.  The patient has a good understanding of the overall plan. she agrees with it. she will call with any problems that may develop before the next visit here.  Total time spent: 30 mins including face to face time and time spent for planning, charting and coordination of care  Rulon Eisenmenger, MD, MPH 07/12/2021  I, Thana Ates, am acting as scribe for Dr. Nicholas Lose.  I have reviewed the above documentation for accuracy and completeness,  and I agree with the above.

## 2021-07-12 ENCOUNTER — Other Ambulatory Visit: Payer: Self-pay

## 2021-07-12 ENCOUNTER — Inpatient Hospital Stay
Payer: No Typology Code available for payment source | Attending: Hematology and Oncology | Admitting: Hematology and Oncology

## 2021-07-12 DIAGNOSIS — C50411 Malignant neoplasm of upper-outer quadrant of right female breast: Secondary | ICD-10-CM

## 2021-07-12 DIAGNOSIS — Z171 Estrogen receptor negative status [ER-]: Secondary | ICD-10-CM

## 2021-07-12 DIAGNOSIS — E2740 Unspecified adrenocortical insufficiency: Secondary | ICD-10-CM | POA: Insufficient documentation

## 2021-07-12 DIAGNOSIS — Z79899 Other long term (current) drug therapy: Secondary | ICD-10-CM | POA: Insufficient documentation

## 2021-07-12 NOTE — Addendum Note (Signed)
Addendum  created 07/12/21 2211 by Belinda Block, MD   Clinical Note Signed, Intraprocedure Blocks edited, SmartForm saved

## 2021-07-12 NOTE — Addendum Note (Signed)
Addendum  created 07/12/21 2341 by Belinda Block, MD   Clinical Note Signed, Intraprocedure Blocks edited

## 2021-07-12 NOTE — Assessment & Plan Note (Signed)
10/31/20:Patient palpated a right breast mass x30mo Diagnostic mammogram and UKoreashowed an abnormality and calcifications in the right breast, 8.2cm, with skin thickening and right axillary adenopathy. 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  Treatment plan: 1. Neoadjuvant chemotherapy with Adriamycin and CytoxanKeytruda4 followed by Taxol weekly 12 with carboplatinand Keytrudaevery 3 weeks x4completed 04/18/2021 followed by 1 year of torted Keytruda maintenance 2. 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 Followed by adjuvant radiation therapy UR CC nausea study CT CAP 11/14/2020: Enlarged right axillary lymph nodes, prominent left axillary lymph node. (Requires an ultrasound) no evidence of metastatic disease in the abdomen or pelvis Bone scan 11/16/2020: No evidence of bone metastases --------------------------------------------------------------------------------------------------------------------------------------------------------- Pathology counseling: I discussed the final pathology report of the patient provided  a copy of this report. I discussed the margins as well as lymph node surgeries. We also discussed the final staging along with previously performed ER/PR and HER-2/neu testing.  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  Immunotherapy has been discontinued.  MRI brain: 05/24/2021: No focal masses, endocrinology follow-up  Treatment plan: 1.  Adjuvant radiation 2. consideration for adjuvant Xeloda since immunotherapy has been discontinued.

## 2021-07-15 ENCOUNTER — Other Ambulatory Visit: Payer: Self-pay | Admitting: Hematology and Oncology

## 2021-07-16 ENCOUNTER — Encounter: Payer: Self-pay | Admitting: Hematology and Oncology

## 2021-07-16 ENCOUNTER — Telehealth: Payer: Self-pay | Admitting: Hematology and Oncology

## 2021-07-16 ENCOUNTER — Other Ambulatory Visit: Payer: Self-pay | Admitting: *Deleted

## 2021-07-16 DIAGNOSIS — Z171 Estrogen receptor negative status [ER-]: Secondary | ICD-10-CM

## 2021-07-16 DIAGNOSIS — C50411 Malignant neoplasm of upper-outer quadrant of right female breast: Secondary | ICD-10-CM

## 2021-07-16 NOTE — Telephone Encounter (Signed)
Spoke with patient to get port flushes schedule. Patient stated she is getting port removed in 3 weeks and does not need port flushes.

## 2021-07-17 NOTE — Progress Notes (Signed)
Error

## 2021-07-21 NOTE — Progress Notes (Incomplete)
Radiation Oncology         [phone] ________________________________  Name: Amber White MRN: [id number]  Date: 07/22/2021  DOB: Nov 07, 1975  Re-Evaluation Note  CC: [name], MD  Nicholas Lose, MD  No diagnosis found.  Diagnosis:   Status post bilateral mastectomies: Stage *** (***) Right Breast UOQ, Residual invasive ductal carcinoma, ad high-grade DCIS, ER+ / PR- / Her2 pending, Grade 3  Narrative:  The patient returns today to discuss radiation treatment options. She was seen in the multidisciplinary breast clinic on 11/07/20.   Since consultation, she underwent genetic testing on 11/07/20. Results showed no clinically significant variants detected.  The patient underwent neoadjuvant chemotherapy consisting of Adriamycin and Cytoxan Keytruda under Dr. Lindi Adie on 11/22/20 through 04/25/21. Taxol was ultimately discontinued following cycle 9 due to worsening peripheral neuropathy.  The patient was also noted to have tachycardia going on for a while and was restarted on her metoprolol 25 mg daily near the end of her final infusion. The patient later met with Dr. Lindi Adie on 04/29/21 and reported that her heart rate was very high and requested evaluation. EKG performed during this visit showed sinus tachy. Accordingly, the patient was encouraged to up metoprolol XL to 50 mg once a day. Other chemo toxicities reported by the patient throughout her systemic treatment included: maculopapular rash (resolved with prednisone 20 mg), severe fatigue (managed with B12 injections weekly with each treatment, and by reducing taxol dose), emotional distress. The patient will continue on 1 year of torted Keytruda maintenance.  She opted to proceed with bilateral mastectomies on 06/27/21 under the care of Dr. Georgette Dover. Pathology from right modified radical mastectomy revealed: grade 3 residual invasive ductal carcinoma and high-grade DCIS spanning approximately 4.8 cm. All resection margins negative for  carcinoma; nipple uninvolved by carcinoma; nodal status of 8/21 lymph nodes positive for metastatic carcinoma. Left mastectomy negative for carcinoma.   Prognostic indicators significant for:  ER status 15% positive with weak staining intensity; PR status negative; Her2 status pending; Proliferation marker Ki67 at 15%; Grade 3.   During post-up follow-up with Dr. Georgette Dover on 07/11/21, the patient reported doing well overall, and was noted to be healing quite nicely. Her two remaining mastectomy drains were noted have decreased serosanguinous output.  In the interval since the patient was last seen, the patient presented to the ED on several occasions, detailed as follows:  --[facility] on 04/15/21: patient presented with a 2-day history of urinary burning, urinary frequency, and urinary urgency. The patient reported taking AZO without relief, which complicated urinalysis. Given her presenting UTI symptoms, the patient was prescribed Macrobid x5 days. --[place] admission 05/21/21 - 05/24/21: patient presented to the ED due to dyspnea on exertion that had gotten progressively worse over 2 weeks,  palpitations, and lightheadedness. She underwent a CTA on admission which was negative for PE or any other pulmonary processes.  A 2D echo was done which showed low/normal EF at 50% and LV hypokinesis, which was new from her prior echocardiogram.  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  She was also found to be anemic in the setting of recent chemotherapy and was transfused 1 unit of packed red blood cells. Cortisol / ACTH were checked, and both came back undetectable, suggesting a central cause.  She was placed on steroids with rapid improvement of her symptoms. At discharge, the patient was placed on Cortef 20/10 over the following 7 days. MRI of the brain performed prior to discharge on  05/24/21 showed no acute intracranial processes.   Pertinent imaging since the  patient was seen for her initial consultation date are as follows:  --Bilateral breast MRI on 11/11/20 showed extensive abnormal enhancement in the right breast spanning at least 10.2 cm, and diffuse skin thickening with a foci of enhancement within the nipple and the skin. MRI also showed at least 7 markedly abnormal right axillary lymph nodes, and 1 prominent intramammary lymph node (without a definite fatty hilum) in the axillary tail of the left breast. The intramammary lymph node was noted to likely correspond with the largest lymph node seen on the mammogram in this location. No evidence of left breast malignancy appreciated.  --CT of the chest abdomen and pelvis on 11/14/20 showed cutaneous skin thickening, with masslike nodularity in the right breast, compatible with known breast malignancy. Enlarged right axillary lymph nodes were again appreciated, measuring up to 1.9 cm in the maximum axial dimension, and consistent with metastatic nodal disease involvement. The prominent left axillary lymph nodes were also again seen, and appeared similar to when seen on recent breast MRI. Otherwise, no evidence of metastatic disease appreciated within the abdomen or pelvis. --Whole body bone scan on 11/14/20 showed no evidence of bony metastatic disease. (Increased soft tissue uptake was seen within the right breast consistent with the patient's known malignancy).  --Bilateral breast MRI on 05/17/21 revealed the 10.2 x 4.9 x 5.0 cm mass-like and non masslike enhancement in the right breast as almost completely resolved. A single remaining focus of enhancement measuring 4.5 mm was seen within the right breast, noted as nonspecific though possibly representing a tiny amount of residual malignancy. Lastly, the two intramammary lymph nodes in the left breast were noted to appear larger in the interval. Given the skin based process, reactive nodes were favored in respects to this findings.  On review of systems, the  patient reports ***. She denies *** and any other symptoms.   Allergies:  is allergic to paclitaxel and penicillins.  Meds: Current Outpatient Medications  Medication Sig Dispense Refill   ADDERALL XR 30 MG 24 hr capsule Take 30 mg by mouth every morning.     bisoprolol (ZEBETA) 10 MG tablet TAKE 1 TABLET BY MOUTH EVERY DAY 30 tablet 0   hydrocortisone (CORTEF) 5 MG tablet Take 2 tablets (10 mg total) by mouth in the morning AND 1 tablet (5 mg total) daily before supper. 90 tablet 1   omeprazole (PRILOSEC) 40 MG capsule TAKE 1 CAPSULE (40 MG TOTAL) BY MOUTH DAILY. (Patient taking differently: Take 40 mg by mouth daily as needed (heartburn).) 90 capsule 1   oxyCODONE (OXY IR/ROXICODONE) 5 MG immediate release tablet Take 1 tablet (5 mg total) by mouth every 6 (six) hours as needed for moderate pain. 30 tablet 0   No current facility-administered medications for this encounter.   Facility-Administered Medications Ordered in Other Encounters  Medication Dose Route Frequency Provider Last Rate Last Admin   filgrastim-aafi (NIVESTYM) 480 MCG/0.8ML injection             Physical Findings: The patient is in no acute distress. Patient is alert and oriented.  vitals were not taken for this visit.  No significant changes. Lungs are clear to auscultation bilaterally. Heart has regular rate and rhythm. No palpable cervical, supraclavicular, or axillary adenopathy. Abdomen soft, non-tender, normal bowel sounds. *** Breast: no palpable mass, nipple discharge or bleeding. *** Breast: ***  Lab Findings: Lab Results  Component Value Date   WBC 7.4  06/28/2021   HGB 9.9 (L) 06/28/2021   HCT 30.9 (L) 06/28/2021   MCV 94.5 06/28/2021   PLT 299 06/28/2021    Radiographic Findings: No results found.  Impression:  Status post bilateral mastectomies: Stage *** (***) Right Breast UOQ, Residual invasive ductal carcinoma, ad high-grade DCIS, ER+ / PR- / Her2 pending, Grade 3  ***  Plan:  Patient is  scheduled for CT simulation {date/later today}. ***  -----------------------------------  Blair Promise, PhD, MD  This document serves as a record of services personally performed by Gery Pray, MD. It was created on his behalf by Roney Mans, a trained medical scribe. The creation of this record is based on the scribe's personal observations and the provider's statements to them. This document has been checked and approved by the attending provider.

## 2021-07-22 ENCOUNTER — Ambulatory Visit: Payer: No Typology Code available for payment source | Admitting: Radiation Oncology

## 2021-07-22 ENCOUNTER — Ambulatory Visit: Payer: No Typology Code available for payment source

## 2021-07-22 ENCOUNTER — Ambulatory Visit
Admission: RE | Admit: 2021-07-22 | Discharge: 2021-07-22 | Disposition: A | Discharge status: Home / Self Care | Payer: No Typology Code available for payment source | Source: Ambulatory Visit | Attending: Radiation Oncology | Admitting: Radiation Oncology

## 2021-07-24 NOTE — Progress Notes (Signed)
Location of Breast Cancer: UOQ right breast  Histology per Pathology Report: Biopsy on 10/31/20 showed: invasive ductal carcinoma, grade 3; DCIS, high grade with necrosis. The biopsied lymph node was positive for metastatic carcinoma.  Receptor Status:  estrogen receptor, 0% negative and progesterone receptor, 0% negative. Proliferation marker Ki67 at 60%. HER2 negative by FISH.  Did patient present with symptoms (if so, please note symptoms) or was this found on screening mammography?: She presented with a palpable right breast lump for approximately 1 month  Past/Anticipated interventions by surgeon, if any:  Procedure performed: Right modified radical mastectomy, left prophylactic mastectomy Surgeon:Matthew K Tsuei  Past/Anticipated interventions by medical oncology, if any: Dr Lindi Adie 11/22/2020 -  Chemotherapy    Patient is on Treatment Plan: BREAST PEMBROLIZUMAB + AC Q21D X 4 CYCLES FOLLOWED BY PEMBROLIZUMAB + CARBOPLATIN D1 + PACLITAXEL D1,8,15 Q21D X 4 CYCLES    Lymphedema issues, if any:  no    Pain issues, if any:  soreness from double mastectomy intermittent  SAFETY ISSUES: Prior radiation? no Pacemaker/ICD? no Possible current pregnancy?no, perimenopausal Is the patient on methotrexate? no  Current Complaints / other details:  right arm is still numb    Vitals:   08/01/21 1238  BP: 104/74  Pulse: 85  Resp: 18  Temp: (!) 96.5 F (35.8 C)  TempSrc: Temporal  SpO2: 100%  Weight: 273 lb (123.8 kg)  Height: _0  (1.626 m)

## 2021-07-25 ENCOUNTER — Institutional Professional Consult (permissible substitution): Payer: No Typology Code available for payment source | Admitting: Plastic Surgery

## 2021-07-26 ENCOUNTER — Ambulatory Visit: Payer: No Typology Code available for payment source | Attending: Surgery | Admitting: Rehabilitation

## 2021-07-29 ENCOUNTER — Encounter: Payer: Self-pay | Admitting: *Deleted

## 2021-07-30 ENCOUNTER — Ambulatory Visit: Payer: Self-pay | Admitting: Surgery

## 2021-07-31 NOTE — Progress Notes (Signed)
Radiation Oncology         [phone] ________________________________  Name: Amber White MRN: [id number]  Date: 08/01/2021  DOB: 1976-06-04  Re-Evaluation Note  CC: [name], MD  Nicholas Lose, MD    ICD-10-CM   1. Malignant neoplasm of upper-outer quadrant of right breast in female, estrogen receptor negative ([place])  C50.411    Z17.1       Diagnosis:   Status post bilateral mastectomies: Stage ypT2, ypN2a   Right Breast UOQ, Residual invasive ductal carcinoma, ad high-grade DCIS, ER+ / PR- / Her2 pending, Grade 3  Narrative:  The patient returns today to discuss radiation treatment options. She was seen in the multidisciplinary breast clinic on 11/07/20.   Since consultation, she underwent genetic testing on 11/07/20. Results showed no clinically significant variants detected.  The patient underwent neoadjuvant chemotherapy consisting of Adriamycin and Cytoxan Keytruda under Dr. Lindi Adie on 11/22/20 through 04/25/21. Taxol was ultimately discontinued following cycle 9 due to worsening peripheral neuropathy.  The patient was also noted to have tachycardia going on for a while and was restarted on her metoprolol 25 mg daily near the end of her final infusion. The patient later met with Dr. Lindi Adie on 04/29/21 and reported that her heart rate was very high and requested evaluation. EKG performed during this visit showed sinus tachycardia. Accordingly, the patient was encouraged to up metoprolol XL to 50 mg once a day. Other chemo toxicities reported by the patient throughout her systemic treatment included: maculopapular rash (resolved with prednisone 20 mg), severe fatigue (managed with B12 injections weekly with each treatment, and by reducing taxol dose), and emotional distress. The patient will continue on with 1 year of torted Keytruda maintenance.  She opted to proceed with bilateral mastectomies on 06/27/21 under the care of Dr. Georgette Dover. Pathology from right modified radical  mastectomy revealed: grade 3 residual invasive ductal carcinoma and high-grade DCIS spanning approximately 4.8 cm. All resection margins negative for carcinoma; nipple uninvolved by carcinoma; nodal status of 8/21 lymph nodes positive for metastatic carcinoma. Left mastectomy negative for carcinoma.   Prognostic indicators significant for:  ER status 15% positive with weak staining intensity; PR status negative; Her2 status pending; Proliferation marker Ki67 at 15%; Grade 3.   During post-up follow-up with Dr. Georgette Dover on 07/11/21, the patient reported doing well overall, and was noted to be healing quite nicely. Her two remaining mastectomy drains were noted have decreased serosanguinous output.  In the interval since the patient was last seen, the patient presented to the ED on several occasions, detailed as follows:  --[facility] on 04/15/21: patient presented with a 2-day history of urinary burning, urinary frequency, and urinary urgency. The patient reported taking AZO without relief, which complicated urinalysis. Given her presenting UTI symptoms, the patient was prescribed Macrobid x5 days. --[facility] admission 05/21/21 - 05/24/21: patient presented to the ED due to dyspnea on exertion that had gotten progressively worse over 2 weeks,  palpitations, and lightheadedness. She underwent a CTA on admission which was negative for PE or any other pulmonary processes.  A 2D echo was done which showed low/normal EF at 50% and LV hypokinesis, which was new from her prior echocardiogram.  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  She was also found to be anemic in the setting of recent chemotherapy and was transfused 1 unit of packed red blood cells. Cortisol / ACTH were checked, and both came back undetectable, suggesting a central cause.  She was  placed on steroids with rapid improvement of her symptoms. At discharge, the patient was placed on Cortef 20/10 over the  following 7 days. MRI of the brain performed prior to discharge on 05/24/21 showed no acute intracranial processes.   Pertinent imaging since the patient was seen for her initial consultation date are as follows:  --Bilateral breast MRI on 11/11/20 showed extensive abnormal enhancement in the right breast spanning at least 10.2 cm, and diffuse skin thickening with a foci of enhancement within the nipple and the skin. MRI also showed at least 7 markedly abnormal right axillary lymph nodes, and 1 prominent intramammary lymph node (without a definite fatty hilum) in the axillary tail of the left breast. The intramammary lymph node was noted to likely correspond with the largest lymph node seen on the mammogram in this location. No evidence of left breast malignancy appreciated.  --CT of the chest abdomen and pelvis on 11/14/20 showed cutaneous skin thickening, with masslike nodularity in the right breast, compatible with known breast malignancy. Enlarged right axillary lymph nodes were again appreciated, measuring up to 1.9 cm in the maximum axial dimension, and consistent with metastatic nodal disease involvement. The prominent left axillary lymph nodes were also again seen, and appeared similar to when seen on recent breast MRI. Otherwise, no evidence of metastatic disease appreciated within the abdomen or pelvis. --Whole body bone scan on 11/14/20 showed no evidence of bony metastatic disease. (Increased soft tissue uptake was seen within the right breast consistent with the patient's known malignancy).  --Bilateral breast MRI on 05/17/21 revealed the 10.2 x 4.9 x 5.0 cm mass-like and non masslike enhancement in the right breast as almost completely resolved. A single remaining focus of enhancement measuring 4.5 mm was seen within the right breast, noted as nonspecific though possibly representing a tiny amount of residual malignancy. Lastly, the two intramammary lymph nodes in the left breast were noted to  appear larger in the interval. Given the skin based process, reactive nodes were favored in respects to this findings.  On review of systems, the patient reports ongoing problems with peripheral neuropathy but this issue is improving.  She is able to work at this time.  She reports numbness along the right chest wall and axillary region.  She denies any obvious swelling in her right arm or hand..   Allergies:  is allergic to paclitaxel and penicillins.  Meds: Current Outpatient Medications  Medication Sig Dispense Refill   ADDERALL XR 30 MG 24 hr capsule Take 30 mg by mouth every morning.     bisoprolol (ZEBETA) 10 MG tablet TAKE 1 TABLET BY MOUTH EVERY DAY 30 tablet 0   hydrocortisone (CORTEF) 5 MG tablet Take 2 tablets (10 mg total) by mouth in the morning AND 1 tablet (5 mg total) daily before supper. 90 tablet 1   omeprazole (PRILOSEC) 40 MG capsule TAKE 1 CAPSULE (40 MG TOTAL) BY MOUTH DAILY. (Patient taking differently: Take 40 mg by mouth daily as needed (heartburn).) 90 capsule 1   oxyCODONE (OXY IR/ROXICODONE) 5 MG immediate release tablet Take 1 tablet (5 mg total) by mouth every 6 (six) hours as needed for moderate pain. (Patient not taking: Reported on 08/01/2021) 30 tablet 0   No current facility-administered medications for this encounter.   Facility-Administered Medications Ordered in Other Encounters  Medication Dose Route Frequency Provider Last Rate Last Admin   filgrastim-aafi (NIVESTYM) 480 MCG/0.8ML injection             Physical Findings: The  patient is in no acute distress. Patient is alert and oriented.  height is '5\' 4"'  (1.626 m) and weight is 273 lb (123.8 kg). Her temporal temperature is 96.5 F (35.8 C) (abnormal). Her blood pressure is 104/74 and her pulse is 85. Her respiration is 18 and oxygen saturation is 100%.   Lungs are clear to auscultation bilaterally. Heart has regular rate and rhythm. No palpable cervical, supraclavicular, or axillary adenopathy. Abdomen  soft, non-tender, normal bowel sounds. Left chest wall area reveals a mastectomy of the scar in place without breakdown or infection. Right chest wall area shows a mastectomy scar in place without breakdown or signs of infection.  No visible or palpable recurrence along the right chest wall area.  Lab Findings: Lab Results  Component Value Date   WBC 7.4 06/28/2021   HGB 9.9 (L) 06/28/2021   HCT 30.9 (L) 06/28/2021   MCV 94.5 06/28/2021   PLT 299 06/28/2021    Radiographic Findings: No results found.  Impression:  Stage ypT2, ypN2a   Right Breast UOQ, Residual invasive ductal carcinoma, ad high-grade DCIS, ER+ / PR- / Her2 pending, Grade 3   She was found to have a partial response to her neoadjuvant chemotherapy.  She was intolerant of Taxol and immunotherapy and this therapy could not be completed.  All of her disease was resected with her right modified radical mastectomy.  No evidence of cancer along the left.  Patient is now ready to proceed with comprehensive radiation therapy along the right chest wall and axillary region.  I discussed the general course of treatment side effects and potential toxicities of radiation therapy in the situation with the patient.  She appears to understand and wishes to proceed with planned course of treatment.  Plan:  Patient is scheduled for CT simulation later today.  Treatments to begin in approximately a week.  Anticipate 6 weeks of postmastectomy radiation therapy along the right side.  Dr. Lindi Adie did mention considering capecitabine and the patient is willing to try this along with her radiation therapy.  She will be watched closely during her chest wall radiation therapy with her being on this radiosensitizing chemotherapy agent.  -----------------------------------  Blair Promise, PhD, MD  This document serves as a record of services personally performed by Gery Pray, MD. It was created on his behalf by Roney Mans, a trained medical  scribe. The creation of this record is based on the scribe's personal observations and the provider's statements to them. This document has been checked and approved by the attending provider.

## 2021-08-01 ENCOUNTER — Ambulatory Visit
Admission: RE | Admit: 2021-08-01 | Discharge: 2021-08-01 | Disposition: A | Discharge status: Home / Self Care | Payer: No Typology Code available for payment source | Source: Ambulatory Visit | Attending: Radiation Oncology | Admitting: Radiation Oncology

## 2021-08-01 ENCOUNTER — Encounter: Payer: Self-pay | Admitting: Radiation Oncology

## 2021-08-01 ENCOUNTER — Other Ambulatory Visit: Payer: Self-pay

## 2021-08-01 VITALS — BP 104/74 | HR 85 | Temp 96.5°F | Resp 18 | Ht 64.0 in | Wt 273.0 lb

## 2021-08-01 DIAGNOSIS — Z510 Encounter for antineoplastic radiation therapy: Secondary | ICD-10-CM | POA: Insufficient documentation

## 2021-08-01 DIAGNOSIS — C50411 Malignant neoplasm of upper-outer quadrant of right female breast: Secondary | ICD-10-CM | POA: Insufficient documentation

## 2021-08-01 DIAGNOSIS — C50911 Malignant neoplasm of unspecified site of right female breast: Secondary | ICD-10-CM

## 2021-08-01 DIAGNOSIS — Z171 Estrogen receptor negative status [ER-]: Secondary | ICD-10-CM

## 2021-08-01 NOTE — Progress Notes (Signed)
See MD note for nursing evaluation. °

## 2021-08-07 ENCOUNTER — Encounter: Payer: Self-pay | Admitting: *Deleted

## 2021-08-08 ENCOUNTER — Other Ambulatory Visit: Payer: Self-pay | Admitting: *Deleted

## 2021-08-08 ENCOUNTER — Institutional Professional Consult (permissible substitution): Payer: No Typology Code available for payment source | Admitting: Plastic Surgery

## 2021-08-08 ENCOUNTER — Ambulatory Visit: Payer: No Typology Code available for payment source | Admitting: Radiation Oncology

## 2021-08-08 DIAGNOSIS — Z171 Estrogen receptor negative status [ER-]: Secondary | ICD-10-CM

## 2021-08-08 DIAGNOSIS — C50411 Malignant neoplasm of upper-outer quadrant of right female breast: Secondary | ICD-10-CM

## 2021-08-08 DIAGNOSIS — Z510 Encounter for antineoplastic radiation therapy: Secondary | ICD-10-CM | POA: Diagnosis not present

## 2021-08-08 NOTE — Progress Notes (Signed)
Received call from pt with complaint of increase in fatigue x several week.  Pt denies dizziness or lightheadedness and states she is constantly tired.  RN reviewed with MD who states symptoms may be related to recent chemo tx and surgery but he would like to assess pt Hgb for the possible need of blood transfusion.  Orders placed, appt scheduled and pt verbalized understanding.

## 2021-08-09 ENCOUNTER — Ambulatory Visit: Payer: No Typology Code available for payment source

## 2021-08-09 ENCOUNTER — Other Ambulatory Visit: Payer: Self-pay | Admitting: Hematology and Oncology

## 2021-08-12 ENCOUNTER — Inpatient Hospital Stay: Payer: No Typology Code available for payment source | Attending: Hematology and Oncology

## 2021-08-12 ENCOUNTER — Other Ambulatory Visit: Payer: Self-pay

## 2021-08-12 ENCOUNTER — Ambulatory Visit
Admission: RE | Admit: 2021-08-12 | Discharge: 2021-08-12 | Disposition: A | Discharge status: Home / Self Care | Payer: No Typology Code available for payment source | Source: Ambulatory Visit | Attending: Radiation Oncology | Admitting: Radiation Oncology

## 2021-08-12 DIAGNOSIS — Z171 Estrogen receptor negative status [ER-]: Secondary | ICD-10-CM

## 2021-08-12 DIAGNOSIS — Z510 Encounter for antineoplastic radiation therapy: Secondary | ICD-10-CM | POA: Diagnosis not present

## 2021-08-13 ENCOUNTER — Telehealth: Payer: Self-pay

## 2021-08-13 ENCOUNTER — Ambulatory Visit: Payer: No Typology Code available for payment source

## 2021-08-13 ENCOUNTER — Ambulatory Visit
Admission: RE | Admit: 2021-08-13 | Discharge: 2021-08-13 | Disposition: A | Discharge status: Home / Self Care | Payer: No Typology Code available for payment source | Source: Ambulatory Visit | Attending: Radiation Oncology | Admitting: Radiation Oncology

## 2021-08-13 DIAGNOSIS — Z171 Estrogen receptor negative status [ER-]: Secondary | ICD-10-CM

## 2021-08-13 DIAGNOSIS — Z510 Encounter for antineoplastic radiation therapy: Secondary | ICD-10-CM | POA: Diagnosis not present

## 2021-08-13 DIAGNOSIS — C50911 Malignant neoplasm of unspecified site of right female breast: Secondary | ICD-10-CM

## 2021-08-13 MED ORDER — RADIAPLEXRX EX GEL
1.0000 "application " | Freq: Once | CUTANEOUS | Status: AC
  Administered 2021-08-13: 1 via TOPICAL

## 2021-08-13 NOTE — Telephone Encounter (Signed)
Called to make patient aware of appointment on 08/16/21 @ 10am  at Dr. Georgette Dover office for skin check per Dr. Sondra Come . Patients agrees with date and time of appointment.

## 2021-08-14 ENCOUNTER — Ambulatory Visit
Admission: RE | Admit: 2021-08-14 | Discharge: 2021-08-14 | Disposition: A | Discharge status: Home / Self Care | Payer: No Typology Code available for payment source | Source: Ambulatory Visit | Attending: Radiation Oncology | Admitting: Radiation Oncology

## 2021-08-14 ENCOUNTER — Telehealth: Payer: Self-pay | Admitting: Hematology and Oncology

## 2021-08-14 ENCOUNTER — Encounter: Payer: Self-pay | Admitting: Hematology and Oncology

## 2021-08-14 DIAGNOSIS — C50911 Malignant neoplasm of unspecified site of right female breast: Secondary | ICD-10-CM | POA: Diagnosis not present

## 2021-08-14 NOTE — Telephone Encounter (Signed)
Sch per 1/27 inbasket, unable to leave message. Will mail calendar

## 2021-08-15 ENCOUNTER — Other Ambulatory Visit: Payer: Self-pay | Admitting: Radiation Oncology

## 2021-08-15 ENCOUNTER — Telehealth: Payer: Self-pay

## 2021-08-15 ENCOUNTER — Other Ambulatory Visit: Payer: Self-pay

## 2021-08-15 ENCOUNTER — Ambulatory Visit
Admission: RE | Admit: 2021-08-15 | Discharge: 2021-08-15 | Disposition: A | Discharge status: Home / Self Care | Payer: No Typology Code available for payment source | Source: Ambulatory Visit | Attending: Radiation Oncology | Admitting: Radiation Oncology

## 2021-08-15 DIAGNOSIS — C50911 Malignant neoplasm of unspecified site of right female breast: Secondary | ICD-10-CM | POA: Diagnosis not present

## 2021-08-15 MED ORDER — SULFAMETHOXAZOLE-TRIMETHOPRIM 800-160 MG PO TABS: 1.0000 | ORAL_TABLET | Freq: Two times a day (BID) | ORAL | 0 refills | Status: DC

## 2021-08-15 NOTE — Telephone Encounter (Signed)
Called to make patient aware of new prescription for antibiotic being sent to [facility] on [address]. Patient voiced understanding.

## 2021-08-16 ENCOUNTER — Ambulatory Visit
Admission: RE | Admit: 2021-08-16 | Discharge: 2021-08-16 | Disposition: A | Discharge status: Home / Self Care | Payer: No Typology Code available for payment source | Source: Ambulatory Visit | Attending: Radiation Oncology | Admitting: Radiation Oncology

## 2021-08-16 DIAGNOSIS — C50911 Malignant neoplasm of unspecified site of right female breast: Secondary | ICD-10-CM | POA: Diagnosis not present

## 2021-08-19 ENCOUNTER — Other Ambulatory Visit: Payer: Self-pay | Admitting: Adult Health

## 2021-08-19 ENCOUNTER — Ambulatory Visit
Admission: RE | Admit: 2021-08-19 | Discharge: 2021-08-19 | Disposition: A | Discharge status: Home / Self Care | Payer: No Typology Code available for payment source | Source: Ambulatory Visit | Attending: Radiation Oncology | Admitting: Radiation Oncology

## 2021-08-19 ENCOUNTER — Other Ambulatory Visit: Payer: Self-pay

## 2021-08-19 DIAGNOSIS — Z171 Estrogen receptor negative status [ER-]: Secondary | ICD-10-CM

## 2021-08-19 DIAGNOSIS — C50411 Malignant neoplasm of upper-outer quadrant of right female breast: Secondary | ICD-10-CM

## 2021-08-19 DIAGNOSIS — C50911 Malignant neoplasm of unspecified site of right female breast: Secondary | ICD-10-CM | POA: Diagnosis not present

## 2021-08-19 LAB — AEROBIC/ANAEROBIC CULTURE W GRAM STAIN (SURGICAL/DEEP WOUND)

## 2021-08-20 ENCOUNTER — Other Ambulatory Visit: Payer: Self-pay | Admitting: Radiation Oncology

## 2021-08-20 ENCOUNTER — Ambulatory Visit
Admission: RE | Admit: 2021-08-20 | Discharge: 2021-08-20 | Disposition: A | Discharge status: Home / Self Care | Payer: No Typology Code available for payment source | Source: Ambulatory Visit | Attending: Radiation Oncology | Admitting: Radiation Oncology

## 2021-08-20 DIAGNOSIS — C50911 Malignant neoplasm of unspecified site of right female breast: Secondary | ICD-10-CM | POA: Diagnosis not present

## 2021-08-20 MED ORDER — FLUCONAZOLE 150 MG PO TABS: 150.0000 mg | ORAL_TABLET | Freq: Every day | ORAL | 0 refills | Status: DC

## 2021-08-21 ENCOUNTER — Other Ambulatory Visit: Payer: Self-pay

## 2021-08-21 ENCOUNTER — Ambulatory Visit
Admission: RE | Admit: 2021-08-21 | Discharge: 2021-08-21 | Disposition: A | Discharge status: Home / Self Care | Payer: No Typology Code available for payment source | Source: Ambulatory Visit | Attending: Radiation Oncology | Admitting: Radiation Oncology

## 2021-08-21 DIAGNOSIS — C50911 Malignant neoplasm of unspecified site of right female breast: Secondary | ICD-10-CM | POA: Diagnosis not present

## 2021-08-21 LAB — SURGICAL PATHOLOGY

## 2021-08-22 ENCOUNTER — Ambulatory Visit
Admission: RE | Admit: 2021-08-22 | Discharge: 2021-08-22 | Disposition: A | Discharge status: Home / Self Care | Payer: No Typology Code available for payment source | Source: Ambulatory Visit | Attending: Radiation Oncology | Admitting: Radiation Oncology

## 2021-08-22 DIAGNOSIS — C50911 Malignant neoplasm of unspecified site of right female breast: Secondary | ICD-10-CM | POA: Diagnosis not present

## 2021-08-22 NOTE — Progress Notes (Signed)
Cardiology Clinic Note   Patient Name: Amber White Date of Encounter: 08/23/2021  Primary Care Provider:  [name], MD Primary Cardiologist:  Pixie Casino, MD  Patient Profile    46 year old female patient with known history of cardiomyopathy, breast cancer, morbid obesity, who was seen during recent hospitalization on 06/27/2021 by Dr. Nechama Guard in the setting of cardiomyopathy and evaluation of fluid status.  The consultation occurred after the patient had a right modified radical mastectomy and left prophylactic mastectomy for treatment of invasive ductal carcinoma of the right breast (Malignant neoplasm of upper-outer quadrant of right breast in female, estrogen receptor negative Stage IIIC cT4, cN1, cM0, G3, ER-, PR-, HER2: Equivocal)  left mastectomy  (benign) with axillary metastasis status post neoadjuvant chemotherapy on 05/21/2021 to 05/24/2021.  Chemotherapy     Patient is on Treatment Plan: BREAST PEMBROLIZUMAB + AC Q21D X 4 CYCLES FOLLOWED BY PEMBROLIZUMAB + CARBOPLATIN D1 + PACLITAXEL D1,8,15 Q21D X 4 CYCLES    Echocardiogram was completed at that time with an LVEF of 50% (down from 75% on 11/19/2020), LV demonstrated global hypokinesis with LV diastolic parameters consistent with grade 1 diastolic dysfunction.  She was continued on beta-blocker and diuretic.  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   Family history of breast cancer    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   Past Surgical History:  Procedure Laterality Date   CHOLECYSTECTOMY     MODIFIED MASTECTOMY Right 06/27/2021   Procedure: RIGHT MODIFIED RADICAL MASTECTOMY;  Surgeon: Donnie Mesa, MD;  Location: [place];  Service: General;  Laterality: Right;   PORTACATH PLACEMENT N/A 11/21/2020    Procedure: INSERTION PORT-A-CATH;  Surgeon: Donnie Mesa, MD;  Location: [place];  Service: General;  Laterality: N/A;   TOTAL MASTECTOMY Left 06/27/2021   Procedure: LEFT TOTAL MASTECTOMY;  Surgeon: Donnie Mesa, MD;  Location: [place];  Service: General;  Laterality: Left;    Allergies  Allergies  Allergen Reactions   Paclitaxel Nausea Only and Other (See Comments)    Complained of chest burning and nausea. Resolved with famotidine, methylprednisolone, and diphenhydramine. Resumed paclitaxel and completed without further incident.    Penicillins Hives    History of Present Illness    Mrs. Amber White today posthospitalization after being seen on consultation by Dr. Alda Lea for nonischemic cardiomyopathy during hospitalization in December 2022.  The etiology for her cardiomyopathy was found to be related to treatment of breast cancer chemotherapy medications. She is currently on bisoprolol 10 mg daily and Lasix 20 mg as needed.  She was to continue her current medication regimen and to have a follow-up echocardiogram completed during this visit.  She comes today with profound fatigue.  She is undergoing radiation therapy has completed chemotherapy.  She states she just has periods when she has significant fatigue and weakness with associated shortness of breath.  She states that just walking back from the waiting area to the clinic exam room did cause her to have some shortness of breath.  She has not been putting on fluid weight in fact she has lost 15 pounds over the last month.  She states that she continues to work and take care of her children, and "pushes through" these episodes so that she can continue  on with her day and her responsibilities.  She denies chest pain or rapid heart rhythm, near syncope, nausea, or diaphoresis associated with these episodes of weakness and shortness of breath.  She has 3 more weeks of radiation.   Current Outpatient Medications   Medication Sig Dispense Refill   ADDERALL XR 30 MG 24 hr capsule Take 30 mg by mouth every morning.     hydrocortisone (CORTEF) 5 MG tablet Take 2 tablets (10 mg total) by mouth in the morning AND 1 tablet (5 mg total) daily before supper. 90 tablet 1   sulfamethoxazole-trimethoprim (BACTRIM DS) 800-160 MG tablet Take 1 tablet by mouth 2 (two) times daily. 14 tablet 0   bisoprolol (ZEBETA) 10 MG tablet TAKE 1 TABLET BY MOUTH EVERY DAY (Patient not taking: Reported on 08/23/2021) 30 tablet 0   fluconazole (DIFLUCAN) 150 MG tablet Take 1 tablet (150 mg total) by mouth daily. Take second tablet 72 hours later (Patient not taking: Reported on 08/23/2021) 2 tablet 0   omeprazole (PRILOSEC) 40 MG capsule TAKE 1 CAPSULE (40 MG TOTAL) BY MOUTH DAILY. (Patient not taking: Reported on 08/23/2021) 90 capsule 1   oxyCODONE (OXY IR/ROXICODONE) 5 MG immediate release tablet Take 1 tablet (5 mg total) by mouth every 6 (six) hours as needed for moderate pain. (Patient not taking: Reported on 08/01/2021) 30 tablet 0   No current facility-administered medications for this visit.   Facility-Administered Medications Ordered in Other Visits  Medication Dose Route Frequency Provider Last Rate Last Admin   filgrastim-aafi ([place]) 480 MCG/0.8ML injection              Family History    Family History  Problem Relation Age of Onset   Breast cancer Mother    Diabetes Maternal Uncle    Diabetes Maternal Grandmother    Heart disease Maternal Grandmother    She indicated that her mother is alive. She indicated that her father is alive. She indicated that the status of her maternal grandmother is unknown. She indicated that her daughter is alive. She indicated that her maternal aunt is alive. She indicated that her maternal uncle is alive. She indicated that her half-brother is alive. She indicated that her half-sister is alive.  Social History    Social History   Socioeconomic History   Marital status: Single     Spouse name: Not on file   Number of children: 4   Years of education: Not on file   Highest education level: Not on file  Occupational History   Not on file  Tobacco Use   Smoking status: Never   Smokeless tobacco: Never  Vaping Use   Vaping Use: Never used  Substance and Sexual Activity   Alcohol use: Yes    Comment: rare   Drug use: No   Sexual activity: Yes    Birth control/protection: None  Other Topics Concern   Not on file  Social History Narrative   Not on file   Social Determinants of Health   Financial Resource Strain: Not on file  Food Insecurity: Not on file  Transportation Needs: Not on file  Physical Activity: Not on file  Stress: Not on file  Social Connections: Not on file  Intimate Partner Violence: Not on file     Review of Systems    General:  No chills, fever, night sweats or positive for 15 lb weight change. Cardiovascular:  No chest pain,positive for dyspnea on exertion, edema, orthopnea, palpitations, paroxysmal nocturnal dyspnea. Dermatological: No  rash, lesions/masses MSK: Bilateral mastectomies  Respiratory: No cough, positive for dyspnea with minimal exertion. Urologic: No hematuria, dysuria Abdominal:   No nausea, vomiting, diarrhea, bright red blood per rectum, melena, or hematemesis Neurologic:  No visual changes, wkns, changes in mental status. All other systems reviewed and are otherwise negative except as noted above.     Physical Exam    VS:  BP 100/70 (BP Location: Left Arm, Patient Position: Sitting)    Pulse 70    Ht '5\' 4"'  (1.626 m)    Wt 263 lb (119.3 kg)    BMI 45.14 kg/m  , BMI Body mass index is 45.14 kg/m.     GEN: Well nourished, well developed, in no acute distress. HEENT: normal. Neck: Supple, no JVD, carotid bruits, or masses. Cardiac: RRR, no murmurs, rubs, or gallops. No clubbing, cyanosis, edema.  Radials/DP/PT 2+ and equal bilaterally.  Respiratory:  Respirations regular and unlabored, clear to auscultation  bilaterally. GI: Soft, nontender, nondistended, BS + x 4. MS: no deformity or atrophy.Bilateral mastectomy. Skin: warm and dry, no rash. Neuro:  Strength and sensation are intact. Psych: Normal affect.  Accessory Clinical Findings    ECG personally reviewed by me today-  - No acute changes  Lab Results  Component Value Date   WBC 7.4 06/28/2021   HGB 9.9 (L) 06/28/2021   HCT 30.9 (L) 06/28/2021   MCV 94.5 06/28/2021   PLT 299 06/28/2021   Lab Results  Component Value Date   CREATININE 0.81 06/28/2021   BUN 8 06/28/2021   NA 135 06/28/2021   K 3.8 06/28/2021   CL 106 06/28/2021   CO2 20 (L) 06/28/2021   Lab Results  Component Value Date   ALT 16 05/22/2021   AST 24 05/22/2021   ALKPHOS 42 05/22/2021   BILITOT 0.6 05/22/2021   No results found for: CHOL, HDL, LDLCALC, LDLDIRECT, TRIG, CHOLHDL  No results found for: HGBA1C  Review of Prior Studies: Echocardiogram 05/22/2021 1. Left ventricular ejection fraction, by estimation, is 50%. The left  ventricle has mildly decreased function. The left ventricle demonstrates  global hypokinesis. Left ventricular diastolic parameters are consistent  with Grade I diastolic dysfunction  (impaired relaxation). The average left ventricular global longitudinal  strain is -15.7 %. The global longitudinal strain is abnormal.   2. Right ventricular systolic function is normal. The right ventricular  size is normal. There is normal pulmonary artery systolic pressure. The  estimated right ventricular systolic pressure is 29.5 mmHg.   3. The mitral valve is normal in structure. Trivial mitral valve  regurgitation. No evidence of mitral stenosis.   4. The aortic valve is tricuspid. Aortic valve regurgitation is not  visualized. No aortic stenosis is present.   5. The inferior vena cava is normal in size with greater than 50%  respiratory variability, suggesting right atrial pressure of 3 mmHg.   Assessment & Plan   1.  Nonischemic  cardiomyopathy: Symptoms of dyspnea on exertion and profound weakness which occurs without warning.  Most recent echocardiogram was in 05/22/2021 with an EF of 50% with global hypokinesis (he average left ventricular global longitudinal  strain is -15.7 %. The global longitudinal strain is abnormal).  I will repeat her echocardiogram for ongoing assessment of LV function and longitudinal strain calculation.  I will also check a CBC and a BNP.  Most recent CBC was completed during hospitalization in December and she was anemic with hemoglobin of 9.9.    2.  Malignant neoplasm of  the upper outer quadrant of the right breast: Status post bilateral mastectomies with benign left breast.  Stage IIIc left breast status postchemotherapy and ongoing radiation.  I believe some of which she is experiencing is related to her breast cancer treatment.  However I think it would be a good idea for her to be seen by a cardio oncologist, and have spoken with Dr. Phineas Inches concerning her as a consulting cardiologist in conjunction with the care provided by Dr. Debara Pickett.  The patient is in agreement to be seen by Dr. Harl Bowie.  I have reassured her that she will still continue to be followed by Dr. Debara Pickett as he is her primary cardiologist.  She states that she will always be indebted to Dr. Debara Pickett concerning his care and evaluation leading to her cancer diagnosis.  She is advised to rest and not to overexert herself despite her responsibilities for her children and her job so that she may have recovery time as she continues treatment.  Current medicines are reviewed at length with the patient today.  I have spent  45 min's  dedicated to the care of this patient on the date of this encounter to include pre-visit review of records, assessment, management and diagnostic testing,with shared decision making.  Signed, Phill Myron. West Pugh, ANP, AACC   08/23/2021 10:01 AM    [facility] [address] Office [phone] Fax [phone]  Notice: This dictation was prepared with Dragon dictation along with smaller phrase technology. Any transcriptional errors that result from this process are unintentional and may not be corrected upon review.

## 2021-08-23 ENCOUNTER — Ambulatory Visit
Admission: RE | Admit: 2021-08-23 | Discharge: 2021-08-23 | Disposition: A | Discharge status: Home / Self Care | Payer: No Typology Code available for payment source | Source: Ambulatory Visit | Attending: Radiation Oncology | Admitting: Radiation Oncology

## 2021-08-23 ENCOUNTER — Ambulatory Visit (INDEPENDENT_AMBULATORY_CARE_PROVIDER_SITE_OTHER): Payer: No Typology Code available for payment source | Admitting: Adult Health

## 2021-08-23 ENCOUNTER — Encounter: Payer: Self-pay | Admitting: Adult Health

## 2021-08-23 ENCOUNTER — Other Ambulatory Visit: Payer: Self-pay

## 2021-08-23 VITALS — BP 100/70 | HR 70 | Ht 64.0 in | Wt 263.0 lb

## 2021-08-23 DIAGNOSIS — I429 Cardiomyopathy, unspecified: Secondary | ICD-10-CM | POA: Diagnosis not present

## 2021-08-23 DIAGNOSIS — Z171 Estrogen receptor negative status [ER-]: Secondary | ICD-10-CM | POA: Diagnosis not present

## 2021-08-23 DIAGNOSIS — C50411 Malignant neoplasm of upper-outer quadrant of right female breast: Secondary | ICD-10-CM | POA: Diagnosis not present

## 2021-08-23 DIAGNOSIS — C50911 Malignant neoplasm of unspecified site of right female breast: Secondary | ICD-10-CM | POA: Diagnosis not present

## 2021-08-23 NOTE — Patient Instructions (Signed)
Medication Instructions:  No Changes *If you need a refill on your cardiac medications before your next appointment, please call your pharmacy*   Lab Work: BNP, CBC If you have labs (blood work) drawn today and your tests are completely normal, you will receive your results only by: [place] (if you have MyChart) OR A paper copy in the mail If you have any lab test that is abnormal or we need to change your treatment, we will call you to review the results.   Testing/Procedures: [address] [place] has requested that you have an echocardiogram. Echocardiography is a painless test that uses sound waves to create images of your heart. It provides your doctor with information about the size and shape of your heart and how well your hearts chambers and valves are working. This procedure takes approximately one hour. There are no restrictions for this procedure.    Follow-Up: At [facility], you and your health needs are our priority.  As part of our continuing mission to provide you with exceptional heart care, we have created designated Provider Care Teams.  These Care Teams include your primary Cardiologist (physician) and Advanced Practice Providers (APPs -  Physician Assistants and Nurse Practitioners) who all work together to provide you with the care you need, when you need it.  We recommend signing up for the patient portal called "MyChart".  Sign up information is provided on this After Visit Summary.  MyChart is used to connect with patients for Virtual Visits (Telemedicine).  Patients are able to view lab/test results, encounter notes, upcoming appointments, etc.  Non-urgent messages can be sent to your provider as well.   To learn more about what you can do with MyChart, go to [email or web address].    Your next appointment:   September 18, 2021 10:00 AM  The format for your next appointment:   In Person  Provider:   Janina Mayo, MD      :1}

## 2021-08-24 LAB — BRAIN NATRIURETIC PEPTIDE: BNP: 14.8 pg/mL (ref 0.0–100.0)

## 2021-08-24 LAB — CBC
Hematocrit: 33.8 % — ABNORMAL LOW (ref 34.0–46.6)
Hemoglobin: 10.8 g/dL — ABNORMAL LOW (ref 11.1–15.9)
MCH: 29.7 pg (ref 26.6–33.0)
MCHC: 32 g/dL (ref 31.5–35.7)
MCV: 93 fL (ref 79–97)
Platelets: 299 10*3/uL (ref 150–450)
RBC: 3.64 x10E6/uL — ABNORMAL LOW (ref 3.77–5.28)
RDW: 12.6 % (ref 11.7–15.4)
WBC: 4.2 10*3/uL (ref 3.4–10.8)

## 2021-08-26 ENCOUNTER — Ambulatory Visit
Admission: RE | Admit: 2021-08-26 | Discharge: 2021-08-26 | Disposition: A | Discharge status: Home / Self Care | Payer: No Typology Code available for payment source | Source: Ambulatory Visit | Attending: Radiation Oncology | Admitting: Radiation Oncology

## 2021-08-26 DIAGNOSIS — C50911 Malignant neoplasm of unspecified site of right female breast: Secondary | ICD-10-CM | POA: Diagnosis not present

## 2021-08-27 ENCOUNTER — Encounter: Payer: Self-pay | Admitting: *Deleted

## 2021-08-27 ENCOUNTER — Ambulatory Visit
Admission: RE | Admit: 2021-08-27 | Discharge: 2021-08-27 | Disposition: A | Discharge status: Home / Self Care | Payer: No Typology Code available for payment source | Source: Ambulatory Visit | Attending: Radiation Oncology | Admitting: Radiation Oncology

## 2021-08-27 ENCOUNTER — Other Ambulatory Visit: Payer: Self-pay

## 2021-08-27 DIAGNOSIS — C50911 Malignant neoplasm of unspecified site of right female breast: Secondary | ICD-10-CM | POA: Diagnosis not present

## 2021-08-28 ENCOUNTER — Ambulatory Visit
Admission: RE | Admit: 2021-08-28 | Discharge: 2021-08-28 | Disposition: A | Discharge status: Home / Self Care | Payer: No Typology Code available for payment source | Source: Ambulatory Visit | Attending: Radiation Oncology | Admitting: Radiation Oncology

## 2021-08-28 DIAGNOSIS — C50911 Malignant neoplasm of unspecified site of right female breast: Secondary | ICD-10-CM | POA: Diagnosis not present

## 2021-08-29 ENCOUNTER — Telehealth: Payer: Self-pay

## 2021-08-29 ENCOUNTER — Ambulatory Visit
Admission: RE | Admit: 2021-08-29 | Discharge: 2021-08-29 | Disposition: A | Discharge status: Home / Self Care | Payer: No Typology Code available for payment source | Source: Ambulatory Visit | Attending: Radiation Oncology | Admitting: Radiation Oncology

## 2021-08-29 DIAGNOSIS — C50911 Malignant neoplasm of unspecified site of right female breast: Secondary | ICD-10-CM | POA: Diagnosis not present

## 2021-08-29 NOTE — Telephone Encounter (Addendum)
Called patient regarding results. Patient had understanding of results.----- Message from Lendon Colonel, NP sent at 08/25/2021  3:50 PM EST ----- She still remains anemic but better than last blood drawn in December. She will need to follow up with oncologist for management.  Waiting on BNP and Echo results. She is also going to see Dr.Mary Branch for cardio/oncology soon.

## 2021-08-30 ENCOUNTER — Other Ambulatory Visit: Payer: Self-pay

## 2021-08-30 ENCOUNTER — Ambulatory Visit
Admission: RE | Admit: 2021-08-30 | Discharge: 2021-08-30 | Disposition: A | Discharge status: Home / Self Care | Payer: No Typology Code available for payment source | Source: Ambulatory Visit | Attending: Radiation Oncology | Admitting: Radiation Oncology

## 2021-08-30 DIAGNOSIS — C50911 Malignant neoplasm of unspecified site of right female breast: Secondary | ICD-10-CM | POA: Diagnosis not present

## 2021-09-02 ENCOUNTER — Other Ambulatory Visit: Payer: Self-pay

## 2021-09-02 ENCOUNTER — Ambulatory Visit
Admission: RE | Admit: 2021-09-02 | Discharge: 2021-09-02 | Disposition: A | Discharge status: Home / Self Care | Payer: No Typology Code available for payment source | Source: Ambulatory Visit | Attending: Radiation Oncology | Admitting: Radiation Oncology

## 2021-09-02 DIAGNOSIS — C50911 Malignant neoplasm of unspecified site of right female breast: Secondary | ICD-10-CM | POA: Diagnosis not present

## 2021-09-03 ENCOUNTER — Ambulatory Visit
Admission: RE | Admit: 2021-09-03 | Discharge: 2021-09-03 | Disposition: A | Discharge status: Home / Self Care | Payer: No Typology Code available for payment source | Source: Ambulatory Visit | Attending: Radiation Oncology | Admitting: Radiation Oncology

## 2021-09-03 DIAGNOSIS — C50911 Malignant neoplasm of unspecified site of right female breast: Secondary | ICD-10-CM

## 2021-09-04 ENCOUNTER — Other Ambulatory Visit: Payer: Self-pay | Admitting: Hematology and Oncology

## 2021-09-04 ENCOUNTER — Other Ambulatory Visit: Payer: Self-pay

## 2021-09-04 ENCOUNTER — Other Ambulatory Visit (HOSPITAL_BASED_OUTPATIENT_CLINIC_OR_DEPARTMENT_OTHER): Payer: No Typology Code available for payment source

## 2021-09-04 ENCOUNTER — Ambulatory Visit
Admission: RE | Admit: 2021-09-04 | Discharge: 2021-09-04 | Disposition: A | Discharge status: Home / Self Care | Payer: No Typology Code available for payment source | Source: Ambulatory Visit | Attending: Radiation Oncology | Admitting: Radiation Oncology

## 2021-09-04 ENCOUNTER — Telehealth (HOSPITAL_COMMUNITY): Payer: Self-pay | Admitting: Adult Health

## 2021-09-04 DIAGNOSIS — C50911 Malignant neoplasm of unspecified site of right female breast: Secondary | ICD-10-CM | POA: Diagnosis not present

## 2021-09-04 NOTE — Telephone Encounter (Signed)
Patient called and cancelled echocardiogram for the reason below:  09/03/2021 4:58 PM HF:SFSEL, Amber White  Cancel Rsn: Patient (has too many appts, will call back to reschedule after her radiation)  Order will be removed from the [facility] and when patient calls back to reschedule we will reinstate the order.

## 2021-09-05 ENCOUNTER — Ambulatory Visit
Admission: RE | Admit: 2021-09-05 | Discharge: 2021-09-05 | Disposition: A | Discharge status: Home / Self Care | Payer: No Typology Code available for payment source | Source: Ambulatory Visit | Attending: Radiation Oncology | Admitting: Radiation Oncology

## 2021-09-05 ENCOUNTER — Encounter: Payer: Self-pay | Admitting: Hematology and Oncology

## 2021-09-05 DIAGNOSIS — C50911 Malignant neoplasm of unspecified site of right female breast: Secondary | ICD-10-CM | POA: Diagnosis not present

## 2021-09-06 ENCOUNTER — Ambulatory Visit
Admission: RE | Admit: 2021-09-06 | Discharge: 2021-09-06 | Disposition: A | Discharge status: Home / Self Care | Payer: No Typology Code available for payment source | Source: Ambulatory Visit | Attending: Radiation Oncology | Admitting: Radiation Oncology

## 2021-09-06 ENCOUNTER — Other Ambulatory Visit: Payer: Self-pay

## 2021-09-06 DIAGNOSIS — C50911 Malignant neoplasm of unspecified site of right female breast: Secondary | ICD-10-CM | POA: Diagnosis not present

## 2021-09-06 LAB — AEROBIC CULTURE W GRAM STAIN (SUPERFICIAL SPECIMEN)

## 2021-09-09 ENCOUNTER — Other Ambulatory Visit: Payer: Self-pay

## 2021-09-09 ENCOUNTER — Other Ambulatory Visit: Payer: Self-pay | Admitting: Radiation Oncology

## 2021-09-09 ENCOUNTER — Ambulatory Visit
Admission: RE | Admit: 2021-09-09 | Discharge: 2021-09-09 | Disposition: A | Discharge status: Home / Self Care | Payer: No Typology Code available for payment source | Source: Ambulatory Visit | Attending: Radiation Oncology | Admitting: Radiation Oncology

## 2021-09-09 ENCOUNTER — Emergency Department (HOSPITAL_COMMUNITY): Payer: No Typology Code available for payment source

## 2021-09-09 ENCOUNTER — Telehealth: Payer: Self-pay

## 2021-09-09 ENCOUNTER — Emergency Department (HOSPITAL_COMMUNITY)
Admission: EM | Admit: 2021-09-09 | Discharge: 2021-09-09 | Disposition: A | Discharge status: Home / Self Care | Payer: No Typology Code available for payment source | Attending: Emergency Medicine | Admitting: Emergency Medicine

## 2021-09-09 ENCOUNTER — Encounter (HOSPITAL_COMMUNITY): Payer: Self-pay

## 2021-09-09 DIAGNOSIS — R0602 Shortness of breath: Secondary | ICD-10-CM | POA: Diagnosis not present

## 2021-09-09 DIAGNOSIS — R531 Weakness: Secondary | ICD-10-CM | POA: Insufficient documentation

## 2021-09-09 DIAGNOSIS — Z853 Personal history of malignant neoplasm of breast: Secondary | ICD-10-CM | POA: Diagnosis not present

## 2021-09-09 DIAGNOSIS — R791 Abnormal coagulation profile: Secondary | ICD-10-CM | POA: Insufficient documentation

## 2021-09-09 DIAGNOSIS — C50911 Malignant neoplasm of unspecified site of right female breast: Secondary | ICD-10-CM | POA: Diagnosis not present

## 2021-09-09 LAB — COMPREHENSIVE METABOLIC PANEL
ALT: 13 U/L (ref 0–44)
AST: 23 U/L (ref 15–41)
Albumin: 3.4 g/dL — ABNORMAL LOW (ref 3.5–5.0)
Alkaline Phosphatase: 33 U/L — ABNORMAL LOW (ref 38–126)
Anion gap: 6 (ref 5–15)
BUN: 8 mg/dL (ref 6–20)
CO2: 23 mmol/L (ref 22–32)
Calcium: 9.3 mg/dL (ref 8.9–10.3)
Chloride: 106 mmol/L (ref 98–111)
Creatinine, Ser: 0.78 mg/dL (ref 0.44–1.00)
GFR, Estimated: >60 mL/min (ref >60)
Glucose, Bld: 111 mg/dL — ABNORMAL HIGH (ref 70–99)
Potassium: 3.3 mmol/L — ABNORMAL LOW (ref 3.5–5.1)
Sodium: 135 mmol/L (ref 135–145)
Total Bilirubin: 0.4 mg/dL (ref 0.3–1.2)
Total Protein: 6.9 g/dL (ref 6.5–8.1)

## 2021-09-09 LAB — CBC WITH DIFFERENTIAL/PLATELET
Abs Immature Granulocytes: 0 10*3/uL (ref 0.00–0.07)
Basophils Absolute: 0 10*3/uL (ref 0.0–0.1)
Basophils Relative: 1 %
Eosinophils Absolute: 0.3 10*3/uL (ref 0.0–0.5)
Eosinophils Relative: 8 %
HCT: 32.3 % — ABNORMAL LOW (ref 36.0–46.0)
Hemoglobin: 10.6 g/dL — ABNORMAL LOW (ref 12.0–15.0)
Immature Granulocytes: 0 %
Lymphocytes Relative: 40 %
Lymphs Abs: 1.4 10*3/uL (ref 0.7–4.0)
MCH: 30.5 pg (ref 26.0–34.0)
MCHC: 32.8 g/dL (ref 30.0–36.0)
MCV: 92.8 fL (ref 80.0–100.0)
Monocytes Absolute: 0.4 10*3/uL (ref 0.1–1.0)
Monocytes Relative: 10 %
Neutro Abs: 1.4 10*3/uL — ABNORMAL LOW (ref 1.7–7.7)
Neutrophils Relative %: 41 %
Platelets: 252 10*3/uL (ref 150–400)
RBC: 3.48 MIL/uL — ABNORMAL LOW (ref 3.87–5.11)
RDW: 13.2 % (ref 11.5–15.5)
WBC: 3.5 10*3/uL — ABNORMAL LOW (ref 4.0–10.5)
nRBC: 0 % (ref 0.0–0.2)

## 2021-09-09 LAB — D-DIMER, QUANTITATIVE: D-Dimer, Quant: 1.79 ug/mL-FEU — ABNORMAL HIGH (ref 0.00–0.50)

## 2021-09-09 LAB — BRAIN NATRIURETIC PEPTIDE: B Natriuretic Peptide: 57.8 pg/mL (ref 0.0–100.0)

## 2021-09-09 LAB — TROPONIN I (HIGH SENSITIVITY): Troponin I (High Sensitivity): 5 ng/L (ref <18)

## 2021-09-09 MED ORDER — SULFAMETHOXAZOLE-TRIMETHOPRIM 800-160 MG PO TABS
1.0000 | ORAL_TABLET | Freq: Two times a day (BID) | ORAL | 0 refills | Status: DC
Start: 2021-09-09 — End: 2021-09-11

## 2021-09-09 MED ORDER — HEPARIN SOD (PORK) LOCK FLUSH 100 UNIT/ML IV SOLN
500.0000 [IU] | Freq: Once | INTRAVENOUS | Status: AC
  Administered 2021-09-09: 500 [IU]
  Filled 2021-09-09: qty 5

## 2021-09-09 MED ORDER — POTASSIUM CHLORIDE ER 10 MEQ PO TBCR: 10.0000 meq | EXTENDED_RELEASE_TABLET | Freq: Two times a day (BID) | ORAL | 0 refills | Status: DC

## 2021-09-09 MED ORDER — IOHEXOL 350 MG/ML SOLN
80.0000 mL | Freq: Once | INTRAVENOUS | Status: AC | PRN
  Administered 2021-09-09: 80 mL via INTRAVENOUS

## 2021-09-09 NOTE — ED Provider Notes (Signed)
[facility] DEPT Provider Note   CSN: [id number] Arrival date & time: 09/09/21  1884     History  Chief Complaint  Patient presents with   Shortness of Breath    Amber White is a 46 y.o. female.   Shortness of Breath Patient presents with shortness of breath.  Has had for weeks now but worse over the last week.  States no coughing.  States she cannot do the same activity she did previously.  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  Is currently on radiation for breast cancer.  States doctors have thought it was just related to everything going on but states it is getting worse.  States her weight has been doing well and if anything is coming down.  No swelling in her leg.  Not having chest pain.    Home Medications Prior to Admission medications   Medication Sig Start Date End Date Taking? Authorizing Provider  potassium chloride (KLOR-CON) 10 MEQ tablet Take 1 tablet (10 mEq total) by mouth 2 (two) times daily. 09/09/21  Yes Davonna Belling, MD  sulfamethoxazole-trimethoprim (BACTRIM DS) 800-160 MG tablet Take 1 tablet by mouth 2 (two) times daily. 09/09/21   Gery Pray, MD  ADDERALL XR 30 MG 24 hr capsule Take 30 mg by mouth every morning. 03/19/21   [provider]  bisoprolol (ZEBETA) 10 MG tablet TAKE 1 TABLET BY MOUTH EVERY DAY 09/05/21   Nicholas Lose, MD  fluconazole (DIFLUCAN) 150 MG tablet Take 1 tablet (150 mg total) by mouth daily. Take second tablet 72 hours later Patient not taking: Reported on 08/23/2021 08/20/21   Gery Pray, MD  hydrocortisone (CORTEF) 5 MG tablet Take 2 tablets (10 mg total) by mouth in the morning AND 1 tablet (5 mg total) daily before supper. 06/03/21   Nicholas Lose, MD  omeprazole (PRILOSEC) 40 MG capsule TAKE 1 CAPSULE (40 MG TOTAL) BY MOUTH DAILY. Patient not taking: Reported on 08/23/2021 05/13/21   Nicholas Lose, MD  oxyCODONE (OXY IR/ROXICODONE) 5 MG immediate release tablet Take 1 tablet (5  mg total) by mouth every 6 (six) hours as needed for moderate pain. Patient not taking: Reported on 08/01/2021 06/28/21   Donnie Mesa, MD  sulfamethoxazole-trimethoprim (BACTRIM DS) 800-160 MG tablet Take 1 tablet by mouth 2 (two) times daily. 08/15/21   Gery Pray, MD  prochlorperazine (COMPAZINE) 10 MG tablet Take 1 tablet (10 mg total) by mouth every 6 (six) hours as needed (Nausea or vomiting). 11/07/20 04/25/21  Nicholas Lose, MD      Allergies    Paclitaxel and Penicillins    Review of Systems   Review of Systems  Constitutional:  Positive for fatigue. Negative for appetite change.  HENT:  Negative for congestion.   Respiratory:  Positive for shortness of breath.   Cardiovascular:  Negative for leg swelling.  Genitourinary:  Negative for flank pain.  Musculoskeletal:  Negative for back pain.  Neurological:  Negative for weakness.   Physical Exam Updated Vital Signs BP 122/86    Pulse 82    Temp 98.5 F (36.9 C) (Oral)    Resp 17    Ht 5\' 4"  (1.626 m)    Wt 119.3 kg    SpO2 96%    BMI 45.14 kg/m  Physical Exam Vitals and nursing note reviewed.  Cardiovascular:     Rate and Rhythm: Regular rhythm.  Pulmonary:     Breath sounds: No wheezing, rhonchi or rales.  Chest:     Chest  wall: No tenderness.  Musculoskeletal:     Cervical back: Neck supple.     Right lower leg: No tenderness. No edema.     Left lower leg: No tenderness. No edema.  Skin:    General: Skin is warm.     Capillary Refill: Capillary refill takes less than 2 seconds.  Neurological:     Mental Status: She is alert.    ED Results / Procedures / Treatments   Labs (all labs ordered are listed, but only abnormal results are displayed) Labs Reviewed  CBC WITH DIFFERENTIAL/PLATELET - Abnormal; Notable for the following components:      Result Value   WBC 3.5 (*)    RBC 3.48 (*)    Hemoglobin 10.6 (*)    HCT 32.3 (*)    Neutro Abs 1.4 (*)    All other components within normal limits  COMPREHENSIVE  METABOLIC PANEL - Abnormal; Notable for the following components:   Potassium 3.3 (*)    Glucose, Bld 111 (*)    Albumin 3.4 (*)    Alkaline Phosphatase 33 (*)    All other components within normal limits  D-DIMER, QUANTITATIVE - Abnormal; Notable for the following components:   D-Dimer, Quant 1.79 (*)    All other components within normal limits  BRAIN NATRIURETIC PEPTIDE  TROPONIN I (HIGH SENSITIVITY)  TROPONIN I (HIGH SENSITIVITY)    EKG EKG Interpretation  Date/Time:  Monday September 09 2021 09:53:04 EST Ventricular Rate:  80 PR Interval:  182 QRS Duration: 98 QT Interval:  412 QTC Calculation: 476 R Axis:   17 Text Interpretation: Sinus rhythm Low voltage, precordial leads Abnormal T, consider ischemia, diffuse leads Confirmed by Davonna Belling [phone]) on 09/09/2021 12:13:34 PM  Radiology CT Angio Chest PE W and/or Wo Contrast  Result Date: 09/09/2021 CLINICAL DATA:  Shortness of breath.  Elevated D-dimer. EXAM: CT ANGIOGRAPHY CHEST WITH CONTRAST TECHNIQUE: Multidetector CT imaging of the chest was performed using the standard protocol during bolus administration of intravenous contrast. Multiplanar CT image reconstructions and MIPs were obtained to evaluate the vascular anatomy. RADIATION DOSE REDUCTION: This exam was performed according to the departmental dose-optimization program which includes automated exposure control, adjustment of the mA and/or kV according to patient size and/or use of iterative reconstruction technique. CONTRAST:  35mL OMNIPAQUE IOHEXOL 350 MG/ML SOLN COMPARISON:  CT chest 05/21/2021 FINDINGS: Cardiovascular: Slightly suboptimal contrast opacification of the pulmonary arteries limiting evaluation beyond the segmental level. Within these limitations no evidence of pulmonary embolism. Normal heart size. No pericardial effusion. Mediastinum/Nodes: No enlarged mediastinal, hilar or left axillary lymph nodes. There are postoperative changes and surgical  clips in the right axilla. The trachea, esophagus and thyroid are normal in appearance. Lungs/Pleura: No new focal consolidation. No pneumothorax or pleural effusion. There is a tiny cyst in the right lung apex which is not significantly changed from prior. Upper Abdomen: No acute abnormality. Musculoskeletal: No acute finding in the visualized skeleton. Skin thickening and asymmetric soft tissue density along the right chest wall likely secondary to radiation. A central venous catheter terminates in the right atrium. Review of the MIP images confirms the above findings. IMPRESSION: 1. Slightly suboptimal contrast opacification of the pulmonary arteries limiting evaluation beyond the segmental level. Within these limitations no evidence of pulmonary embolism. 2. No acute intrathoracic process. Electronically Signed   By: Audie Pinto M.D.   On: 09/09/2021 11:59   DG Chest Portable 1 View  Result Date: 09/09/2021 CLINICAL DATA:  Shortness of breath EXAM:  PORTABLE CHEST 1 VIEW COMPARISON:  Previous studies including the examination of 05/21/2021 FINDINGS: Transverse diameter of heart is slightly increased. Tip of right chest port is seen in the superior vena cava. Surgical clips are seen in the right chest wall. There are no signs of pulmonary edema or new focal infiltrates. There is no pleural effusion or pneumothorax. IMPRESSION: There are no new infiltrates or signs of pulmonary edema. Electronically Signed   By: Elmer Picker M.D.   On: 09/09/2021 10:34    Procedures Procedures    Medications Ordered in ED Medications  iohexol (OMNIPAQUE) 350 MG/ML injection 80 mL (80 mLs Intravenous Contrast Given 09/09/21 1135)  heparin lock flush 100 unit/mL (500 Units Intracatheter Given 09/09/21 1234)    ED Course/ Medical Decision Making/ A&P                           Medical Decision Making Amount and/or Complexity of Data Reviewed Labs: ordered. Radiology: ordered.  Risk Prescription drug  management.   Patient with shortness of breath.  Has been going on for weeks but worse over the last week.  On radiation for breast cancer and only has 1 treatment left.  Does have a history of heart failure from the chemotherapy.  Had rescheduled appointment for repeat echo recently.  Initial differential diagnosis is long but includes pathologies such as heart failure, pulmonary embolism, pneumonia. Chest x-ray interpreted by me and reassuring.  No infiltrates or edema.  BNP is normal.  D-dimer done and elevated.  Chest CT done and showed no acute cause of the dyspnea.  No pulmonary embolism although slightly poor timing of the bolus.  Instructed patient of findings.  Overall reassuring.  Initial differential diagnosis for dyspnea and shortness of breath as long and includes life-threatening conditions.  Will discharge home with outpatient follow-up.        Final Clinical Impression(s) / ED Diagnoses Final diagnoses:  Weakness    Rx / DC Orders ED Discharge Orders          Ordered    potassium chloride (KLOR-CON) 10 MEQ tablet  2 times daily        09/09/21 1223              Davonna Belling, MD 09/09/21 1931

## 2021-09-09 NOTE — ED Notes (Signed)
Pt ambulatory to waiting room. Pt verbalized understanding of discharge instructions.   

## 2021-09-09 NOTE — ED Notes (Signed)
Provider at bedside

## 2021-09-09 NOTE — Telephone Encounter (Signed)
Called to make patient aware of recent culture results that were obtained from right axilla. Patient stated that she was on the way to the emergency room due to increase drainage with foul odor from right axilla and difficulty breathing. Patient requested antibiotic be called in to [facility] on [address].

## 2021-09-09 NOTE — ED Triage Notes (Signed)
Patient c/o SOB "sometime last week." Patient denies any coughing, fever. Patient reports that she receives radiation for breast cancer.

## 2021-09-09 NOTE — Discharge Instructions (Addendum)
Follow-up with your doctors.  Follow-up with your cardiologist.  Your work-up was reassuring today all your potassium was just a little low.

## 2021-09-10 ENCOUNTER — Encounter: Payer: Self-pay | Admitting: Radiology

## 2021-09-10 ENCOUNTER — Ambulatory Visit
Admission: RE | Admit: 2021-09-10 | Discharge: 2021-09-10 | Disposition: A | Discharge status: Home / Self Care | Payer: No Typology Code available for payment source | Source: Ambulatory Visit | Attending: Radiation Oncology | Admitting: Radiation Oncology

## 2021-09-10 ENCOUNTER — Telehealth: Payer: Self-pay | Admitting: Radiology

## 2021-09-10 ENCOUNTER — Encounter: Payer: Self-pay | Admitting: Hematology and Oncology

## 2021-09-10 ENCOUNTER — Telehealth: Payer: Self-pay | Admitting: *Deleted

## 2021-09-10 DIAGNOSIS — C50911 Malignant neoplasm of unspecified site of right female breast: Secondary | ICD-10-CM | POA: Diagnosis not present

## 2021-09-10 NOTE — Progress Notes (Signed)
? ?Patient Care Team: ?Hague, Amber Charters, MD as PCP - General (Internal Medicine) ?Pixie Casino, MD as PCP - Cardiology (Cardiology) ?Rockwell Germany, RN as Oncology Nurse Navigator ?Mauro Kaufmann, RN as Oncology Nurse Navigator ?Donnie Mesa, MD as Consulting Physician (General Surgery) ?Nicholas Lose, MD as Consulting Physician (Hematology and Oncology) ?Gery Pray, MD as Consulting Physician (Radiation Oncology) ? ?DIAGNOSIS:  ?  ICD-10-CM   ?1. Malignant neoplasm of upper-outer quadrant of right breast in female, estrogen receptor negative ([place])  C50.411   ? Z17.1   ?  ? ? ?SUMMARY OF ONCOLOGIC HISTORY: ?Oncology History  ?Malignant neoplasm of upper-outer quadrant of right breast in female, estrogen receptor negative ([place])  ?10/31/2020 Initial Diagnosis  ? Patient palpated a right breast mass x55mo Diagnostic mammogram and UKoreashowed an abnormality and calcifications in the right breast, 8.2cm, with skin thickening and right axillary adenopathy. 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 ?  ?11/07/2020 Cancer Staging  ? Staging form: Breast, AJCC 8th Edition ?- Clinical: Stage IIIC (cT4, cN1, cM0, G3, ER-, PR-, HER2: Equivocal) - Signed by GNicholas Lose MD on 11/07/2020 ?Histologic grading system: 3 grade system ? ?  ?11/22/2020 -  Chemotherapy  ? Patient is on Treatment Plan: BREAST PEMBROLIZUMAB + AC Q21D X 4 CYCLES FOLLOWED BY PEMBROLIZUMAB + CARBOPLATIN D1 + PACLITAXEL D1,8,15 Q21D X 4 CYCLES ? ?  ? ?  ? Genetic Testing  ? Negative genetic testing. No pathogenic variants identified on the [facility] panel. The report date is 11/28/2020. ? ?The CancerNext-Expanded + RNAinsight gene panel offered by [employer] includes sequencing and rearrangement analysis for the following 77 genes: IP, ALK, APC*, ATM*, AXIN2, BAP1, BARD1, BLM, BMPR1A, BRCA1*, BRCA2*, BRIP1*, CDC73, CDH1*,CDK4,  CDKN1B, CDKN2A, CHEK2*, CTNNA1, DICER1, FANCC, FH, FLCN, GALNT12, KIF1B, LZTR1, MAX, MEN1, MET, MLH1*, MSH2*, MSH3, MSH6*, MUTYH*, NBN, NF1*, NF2, NTHL1, PALB2*, PHOX2B, PMS2*, POT1, PRKAR1A, PTCH1, PTEN*, RAD51C*, RAD51D*,RB1, RECQL, RET, SDHA, SDHAF2, SDHB, SDHC, SDHD, SMAD4, SMARCA4, SMARCB1, SMARCE1, STK11, SUFU, TMEM127, TP53*,TSC1, TSC2, VHL and XRCC2 (sequencing and deletion/duplication); EGFR, EGLN1, HOXB13, KIT, MITF, PDGFRA, POLD1 and POLE (sequencing only); EPCAM and GREM1 (deletion/duplication only). ?  ? ? ?CHIEF COMPLIANT: Follow-up of breast cancer ? ?INTERVAL HISTORY: ESheyla Zaffinois a 46y.o. with above-mentioned history of breast cancer who received neoadjuvant chemotherapy which was discontinued early because of worsening peripheral neuropathy. She presents to the clinic today for follow-up.  She came in for an urgent visit because she was feeling dizziness lightheadedness and shortness of breath to minimal exertion.  She went to the emergency room and underwent a CT angiogram which did not show any evidence of pulmonary embolism.  She was noted to be only mildly anemic.  She was prescribed potassium supplementation.  She is here today because she was unable to get her medication refill for the hydrocortisone which she takes for adrenal insufficiency. ? ?ALLERGIES:  is allergic to paclitaxel and penicillins. ? ?MEDICATIONS:  ?Current Outpatient Medications  ?Medication Sig Dispense Refill  ? ADDERALL XR 30 MG 24 hr capsule Take 30 mg by mouth every morning.    ? bisoprolol (ZEBETA) 10 MG tablet TAKE 1 TABLET BY MOUTH EVERY DAY 30 tablet 0  ? hydrocortisone (CORTEF) 5 MG tablet Take 1 tablet (5 mg total) by mouth in the morning AND 0.5 tablets (2.5 mg total) daily before supper. 90 tablet 3  ? omeprazole (  PRILOSEC) 40 MG capsule TAKE 1 CAPSULE (40 MG TOTAL) BY MOUTH DAILY. (Patient not taking: Reported on 08/23/2021) 90 capsule 1  ? potassium chloride (KLOR-CON) 10 MEQ tablet Take 1 tablet (10 mEq  total) by mouth 2 (two) times daily. 8 tablet 0  ? ?No current facility-administered medications for this visit.  ? ?Facility-Administered Medications Ordered in Other Visits  ?Medication Dose Route Frequency Provider Last Rate Last Admin  ? filgrastim-aafi (NIVESTYM) 480 MCG/0.8ML injection           ? ? ?PHYSICAL EXAMINATION: ?ECOG PERFORMANCE STATUS: 1 - Symptomatic but completely ambulatory ? ?Vitals:  ? 09/11/21 1338  ?BP: 124/69  ?Pulse: 86  ?Resp: 18  ?Temp: (!) 97.5 ?F (36.4 ?C)  ?SpO2: 100%  ? ?Filed Weights  ? 09/11/21 1338  ?Weight: 261 lb 1.6 oz (118.4 kg)  ? ? ?LABORATORY DATA:  ?I have reviewed the data as listed ?CMP Latest Ref Rng & Units 09/09/2021 06/28/2021 06/25/2021  ?Glucose 70 - 99 mg/dL 111(H) 112(H) 105(H)  ?BUN 6 - 20 mg/dL '8 8 8  ' ?Creatinine 0.44 - 1.00 mg/dL 0.78 0.81 0.71  ?Sodium 135 - 145 mmol/L 135 135 137  ?Potassium 3.5 - 5.1 mmol/L 3.3(L) 3.8 3.9  ?Chloride 98 - 111 mmol/L 106 106 105  ?CO2 22 - 32 mmol/L 23 20(L) 24  ?Calcium 8.9 - 10.3 mg/dL 9.3 8.8(L) 9.4  ?Total Protein 6.5 - 8.1 g/dL 6.9 - -  ?Total Bilirubin 0.3 - 1.2 mg/dL 0.4 - -  ?Alkaline Phos 38 - 126 U/L 33(L) - -  ?AST 15 - 41 U/L 23 - -  ?ALT 0 - 44 U/L 13 - -  ? ? ?Lab Results  ?Component Value Date  ? WBC 3.5 (L) 09/09/2021  ? HGB 10.6 (L) 09/09/2021  ? HCT 32.3 (L) 09/09/2021  ? MCV 92.8 09/09/2021  ? PLT 252 09/09/2021  ? NEUTROABS 1.4 (L) 09/09/2021  ? ? ?ASSESSMENT & PLAN:  ?Malignant neoplasm of upper-outer quadrant of right breast in female, estrogen receptor negative ([place]) ?10/31/20: Patient palpated a right breast mass x106mo Diagnostic mammogram and UKoreashowed an abnormality and calcifications in the right breast, 8.2cm, with skin thickening and right axillary adenopathy. 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 ?  ?Treatment plan: ?1. Neoadjuvant chemotherapy with Adriamycin and Cytoxan Keytruda ?4 followed by  Taxol weekly ?12 with carboplatin and Keytruda every 3 weeks x4 completed 04/18/2021 followed by 1 year of torted Keytruda maintenance ?2. 06/27/2021: Bilateral mastectomies: ?Left mastectomy: Benign ?Right mastectomy: Grade 3 IDC with high-grade DCIS 4.8 cm, margins negative, 8/21 lymph nodes positive ?3. Followed by adjuvant radiation therapy ?UR CC nausea study ?CT CAP 11/14/2020: Enlarged right axillary lymph nodes, prominent left axillary lymph node.  (Requires an ultrasound) no evidence of metastatic disease in the abdomen or pelvis ?Bone scan 11/16/2020: No evidence of bone metastases ?--------------------------------------------------------------------------------------------------------------------------------------------------------- ?  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  Immunotherapy has been discontinued.  MRI brain: 05/24/2021: No focal masses, endocrinology follow-up ?  ?Treatment plan: ?1.  Adjuvant radiation to complete 09/20/21 ?2. consideration for adjuvant Xeloda since immunotherapy has been discontinued. ?However because of patient's profound fatigue lightheadedness feeling of passing out due to adrenal insufficiency, we decided to watch and wait until she gets better from all of this. ?  ?Emergency room visit: For shortness of breath: CT angiogram: No  PE and no intrathoracic abnormalities. ?Our plan is to perform CT scans every 6 months for the first 2 years. ?  ?Return to clinic in 4 months for labs and follow-up ? ? ? ?No orders of the defined types were placed in this encounter. ? ?The patient has a good understanding of the overall plan. she agrees with it. she will call with any problems that may develop before the next visit here. ? ?Total time spent: 20 mins including face to face time and time spent for planning, charting and coordination of care ? ?Rulon Eisenmenger, MD, MPH ?09/11/2021 ? ?I, Thana Ates,  am acting as scribe for Dr. Nicholas Lose. ? ?I have reviewed the above documentation for accuracy and completeness, and I agree with the above. ? ? ? ? ? ? ?

## 2021-09-10 NOTE — Telephone Encounter (Signed)
Received call from pt with complaint of worsening of shortness of breath x several days.  Pt states she was recently seen in the ED with all workup negative and instructed to f/u in our office for further evaluation. Appt scheduled, pt verbalized understanding of appt date and time.

## 2021-09-10 NOTE — Telephone Encounter (Signed)
Notified patient of appointment with Dr Georgette Dover regarding surgical incision infection on 09/13/2021 at 9:00. Patient verbalized understanding.

## 2021-09-10 NOTE — Assessment & Plan Note (Signed)
10/31/20:Patient palpated a right breast mass x107mo Diagnostic mammogram and UKoreashowed an abnormality and calcifications in the right breast, 8.2cm, with skin thickening and right axillary adenopathy. 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  Treatment plan: 1. Neoadjuvant chemotherapy with Adriamycin and CytoxanKeytruda4 followed by Taxol weekly 12 with carboplatinand Keytrudaevery 3 weeks x4completed 04/18/2021 followed by 1 year of torted Keytruda maintenance 2. 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 Followed by adjuvant radiation therapy UR CC nausea study CT CAP 11/14/2020: Enlarged right axillary lymph nodes, prominent left axillary lymph node. (Requires an ultrasound) no evidence of metastatic disease in the abdomen or pelvis Bone scan 11/16/2020: No evidence of bone metastases --------------------------------------------------------------------------------------------------------------------------------------------------------- Pathology counseling: I discussed the final pathology report of the patient provided  a copy of this report. I discussed the margins as well as lymph node surgeries. We also discussed the final staging along with previously performed ER/PR and HER-2/neu testing.  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  Immunotherapy has been discontinued.  MRI brain: 05/24/2021: No focal masses, endocrinology follow-up  Treatment plan: 1.  Adjuvant radiation to complete 09/20/21 2. consideration for adjuvant Xeloda since immunotherapy has been discontinued.  We will keep the port until 6 months to year and then it can be removed if necessary.  Patient is at a higher risk  for recurrence and therefore we are leaving the port in for the time being.  Return to clinic in 3 months for SCP visit

## 2021-09-11 ENCOUNTER — Inpatient Hospital Stay (HOSPITAL_BASED_OUTPATIENT_CLINIC_OR_DEPARTMENT_OTHER): Payer: No Typology Code available for payment source | Admitting: Hematology and Oncology

## 2021-09-11 ENCOUNTER — Other Ambulatory Visit: Payer: Self-pay

## 2021-09-11 ENCOUNTER — Ambulatory Visit
Admission: RE | Admit: 2021-09-11 | Discharge: 2021-09-11 | Disposition: A | Discharge status: Home / Self Care | Payer: No Typology Code available for payment source | Source: Ambulatory Visit | Attending: Radiation Oncology | Admitting: Radiation Oncology

## 2021-09-11 DIAGNOSIS — C50911 Malignant neoplasm of unspecified site of right female breast: Secondary | ICD-10-CM | POA: Diagnosis present

## 2021-09-11 DIAGNOSIS — C773 Secondary and unspecified malignant neoplasm of axilla and upper limb lymph nodes: Secondary | ICD-10-CM | POA: Insufficient documentation

## 2021-09-11 DIAGNOSIS — Z171 Estrogen receptor negative status [ER-]: Secondary | ICD-10-CM | POA: Insufficient documentation

## 2021-09-11 DIAGNOSIS — C50411 Malignant neoplasm of upper-outer quadrant of right female breast: Secondary | ICD-10-CM | POA: Insufficient documentation

## 2021-09-11 DIAGNOSIS — I429 Cardiomyopathy, unspecified: Secondary | ICD-10-CM | POA: Insufficient documentation

## 2021-09-11 DIAGNOSIS — Z79899 Other long term (current) drug therapy: Secondary | ICD-10-CM | POA: Insufficient documentation

## 2021-09-11 MED ORDER — HYDROCORTISONE 5 MG PO TABS: ORAL_TABLET | ORAL | 3 refills | Status: DC

## 2021-09-12 ENCOUNTER — Ambulatory Visit
Admission: RE | Admit: 2021-09-12 | Discharge: 2021-09-12 | Disposition: A | Discharge status: Home / Self Care | Payer: No Typology Code available for payment source | Source: Ambulatory Visit | Attending: Radiation Oncology | Admitting: Radiation Oncology

## 2021-09-12 DIAGNOSIS — C50911 Malignant neoplasm of unspecified site of right female breast: Secondary | ICD-10-CM | POA: Diagnosis not present

## 2021-09-13 ENCOUNTER — Ambulatory Visit
Admission: RE | Admit: 2021-09-13 | Discharge: 2021-09-13 | Disposition: A | Discharge status: Home / Self Care | Payer: No Typology Code available for payment source | Source: Ambulatory Visit | Attending: Radiation Oncology | Admitting: Radiation Oncology

## 2021-09-13 DIAGNOSIS — C50911 Malignant neoplasm of unspecified site of right female breast: Secondary | ICD-10-CM | POA: Diagnosis not present

## 2021-09-16 ENCOUNTER — Ambulatory Visit
Admission: RE | Admit: 2021-09-16 | Discharge: 2021-09-16 | Disposition: A | Discharge status: Home / Self Care | Payer: No Typology Code available for payment source | Source: Ambulatory Visit | Attending: Radiation Oncology | Admitting: Radiation Oncology

## 2021-09-16 ENCOUNTER — Other Ambulatory Visit: Payer: Self-pay

## 2021-09-16 DIAGNOSIS — C50911 Malignant neoplasm of unspecified site of right female breast: Secondary | ICD-10-CM | POA: Diagnosis not present

## 2021-09-16 NOTE — Progress Notes (Signed)
? ?Patient Care Team: ?Hague, Rosalyn Charters, MD as PCP - General (Internal Medicine) ?Pixie Casino, MD as PCP - Cardiology (Cardiology) ?Rockwell Germany, RN as Oncology Nurse Navigator ?Mauro Kaufmann, RN as Oncology Nurse Navigator ?Donnie Mesa, MD as Consulting Physician (General Surgery) ?Nicholas Lose, MD as Consulting Physician (Hematology and Oncology) ?Gery Pray, MD as Consulting Physician (Radiation Oncology) ? ?DIAGNOSIS:  ?  ICD-10-CM   ?1. Malignant neoplasm of upper-outer quadrant of right breast in female, estrogen receptor negative ([place])  C50.411   ? Z17.1   ?  ? ? ?SUMMARY OF ONCOLOGIC HISTORY: ?Oncology History  ?Malignant neoplasm of upper-outer quadrant of right breast in female, estrogen receptor negative ([place])  ?10/31/2020 Initial Diagnosis  ? Patient palpated a right breast mass x67mo. Diagnostic mammogram and US showed an abnormality and calcifications in the right breast, 8.2cm, with skin thickening and right axillary adenopathy. 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 ?  ?11/07/2020 Cancer Staging  ? Staging form: Breast, AJCC 8th Edition ?- Clinical: Stage IIIC (cT4, cN1, cM0, G3, ER-, PR-, HER2: Equivocal) - Signed by Nicholas Lose, MD on 11/07/2020 ?Histologic grading system: 3 grade system ? ?  ?11/22/2020 -  Chemotherapy  ? Patient is on Treatment Plan: BREAST PEMBROLIZUMAB + AC Q21D X 4 CYCLES FOLLOWED BY PEMBROLIZUMAB + CARBOPLATIN D1 + PACLITAXEL D1,8,15 Q21D X 4 CYCLES ? ?  ? ?  ? Genetic Testing  ? Negative genetic testing. No pathogenic variants identified on the [facility] CancerNext-Expanded+RNA panel. The report date is 11/28/2020. ? ?The CancerNext-Expanded + RNAinsight gene panel offered by [employer] and includes sequencing and rearrangement analysis for the following 77 genes: IP, ALK, APC*, ATM*, AXIN2, BAP1, BARD1, BLM, BMPR1A, BRCA1*, BRCA2*, BRIP1*, CDC73, CDH1*,CDK4,  CDKN1B, CDKN2A, CHEK2*, CTNNA1, DICER1, FANCC, FH, FLCN, GALNT12, KIF1B, LZTR1, MAX, MEN1, MET, MLH1*, MSH2*, MSH3, MSH6*, MUTYH*, NBN, NF1*, NF2, NTHL1, PALB2*, PHOX2B, PMS2*, POT1, PRKAR1A, PTCH1, PTEN*, RAD51C*, RAD51D*,RB1, RECQL, RET, SDHA, SDHAF2, SDHB, SDHC, SDHD, SMAD4, SMARCA4, SMARCB1, SMARCE1, STK11, SUFU, TMEM127, TP53*,TSC1, TSC2, VHL and XRCC2 (sequencing and deletion/duplication); EGFR, EGLN1, HOXB13, KIT, MITF, PDGFRA, POLD1 and POLE (sequencing only); EPCAM and GREM1 (deletion/duplication only). ?  ? ? ?CHIEF COMPLIANT: Follow-up of breast cancer ? ?INTERVAL HISTORY: Rogue Pautler is a 46 y.o. with above-mentioned history of breast cancer who received neoadjuvant chemotherapy which was discontinued early because of worsening peripheral neuropathy. She presents to the clinic today for follow-up.  She was started on hydrocortisone tablets and she has noticed remarkable improvement in her breathing and energy levels and her overall health.  She feels remarkably well today ? ?Undergoing radiation. ? ?ALLERGIES:  is allergic to paclitaxel and penicillins. ? ?MEDICATIONS:  ?Current Outpatient Medications  ?Medication Sig Dispense Refill  ? ADDERALL XR 30 MG 24 hr capsule Take 30 mg by mouth every morning.    ? bisoprolol (ZEBETA) 10 MG tablet TAKE 1 TABLET BY MOUTH EVERY DAY 30 tablet 0  ? hydrocortisone (CORTEF) 5 MG tablet Take 1 tablet (5 mg total) by mouth in the morning AND 0.5 tablets (2.5 mg total) daily before supper. 90 tablet 3  ? omeprazole (PRILOSEC) 40 MG capsule TAKE 1 CAPSULE (40 MG TOTAL) BY MOUTH DAILY. (Patient not taking: Reported on 08/23/2021) 90 capsule 1  ? potassium chloride (KLOR-CON) 10 MEQ tablet Take 1 tablet (10 mEq total) by mouth 2 (two) times daily. 8 tablet 0  ? ?No  current facility-administered medications for this visit.  ? ?Facility-Administered Medications Ordered in Other Visits  ?Medication Dose Route Frequency Provider Last Rate Last Admin  ? filgrastim-aafi (NIVESTYM)  480 MCG/0.8ML injection           ? ? ?PHYSICAL EXAMINATION: ?ECOG PERFORMANCE STATUS: 1 - Symptomatic but completely ambulatory ? ?Vitals:  ? 09/17/21 1529  ?BP: 124/82  ?Pulse: 84  ?Resp: 18  ?Temp: (!) 97.2 ?F (36.2 ?C)  ?SpO2: 100%  ? ?Filed Weights  ? 09/17/21 1529  ?Weight: 260 lb 1.6 oz (118 kg)  ? ?  ? ?LABORATORY DATA:  ?I have reviewed the data as listed ?CMP Latest Ref Rng & Units 09/09/2021 06/28/2021 06/25/2021  ?Glucose 70 - 99 mg/dL 111(H) 112(H) 105(H)  ?BUN 6 - 20 mg/dL _0 ?Creatinine 0.44 - 1.00 mg/dL 0.78 0.81 0.71  ?Sodium 135 - 145 mmol/L 135 135 137  ?Potassium 3.5 - 5.1 mmol/L 3.3(L) 3.8 3.9  ?Chloride 98 - 111 mmol/L 106 106 105  ?CO2 22 - 32 mmol/L 23 20(L) 24  ?Calcium 8.9 - 10.3 mg/dL 9.3 8.8(L) 9.4  ?Total Protein 6.5 - 8.1 g/dL 6.9 - -  ?Total Bilirubin 0.3 - 1.2 mg/dL 0.4 - -  ?Alkaline Phos 38 - 126 U/L 33(L) - -  ?AST 15 - 41 U/L 23 - -  ?ALT 0 - 44 U/L 13 - -  ? ? ?Lab Results  ?Component Value Date  ? WBC 3.5 (L) 09/09/2021  ? HGB 10.6 (L) 09/09/2021  ? HCT 32.3 (L) 09/09/2021  ? MCV 92.8 09/09/2021  ? PLT 252 09/09/2021  ? NEUTROABS 1.4 (L) 09/09/2021  ? ? ?ASSESSMENT & PLAN:  ?Malignant neoplasm of upper-outer quadrant of right breast in female, estrogen receptor negative ([place]) ?calcifications in the right breast, 8.2cm, with skin thickening and right axillary adenopathy. 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 ?  ?Treatment plan: ?1. Neoadjuvant chemotherapy with Adriamycin and Cytoxan Keytruda ?4 followed by Taxol weekly ?12 with carboplatin and Keytruda every 3 weeks x4 completed 04/18/2021 followed by 1 year of torted Keytruda maintenance ?2. 06/27/2021: Bilateral mastectomies: ?Left mastectomy: Benign ?Right mastectomy: Grade 3 IDC with high-grade DCIS 4.8 cm, margins negative, 8/21 lymph nodes positive ?3. Followed by adjuvant radiation therapy ?UR CC nausea study ?CT CAP  11/14/2020: Enlarged right axillary lymph nodes, prominent left axillary lymph node.  (Requires an ultrasound) no evidence of metastatic disease in the abdomen or pelvis ?Bone scan 11/16/2020: No evidence of bone metastases ?--------------------------------------------------------------------------------------------------------------------------------------------------------- ?  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  Immunotherapy has been discontinued.  MRI brain: 05/24/2021: No focal masses, endocrinology follow-up ?  ?Treatment plan: ?1.  Adjuvant radiation to complete 09/20/21 ?2. consideration for adjuvant Xeloda since immunotherapy has been discontinued. ?  ?Emergency room visit: For shortness of breath: CT angiogram: No PE and no intrathoracic abnormalities. ?Our plan is to perform CT scans every 6 months for the first 2 years. ?  ?Worsening shortness of breath: Marked improvement with hydrocortisone (treating her for adrenal insufficiency) remarkable improvement in the strength energy levels and fatigue and shortness of breath. ? ?Return to clinic every other treatment ? ?No orders of the defined types were placed in this encounter. ? ?The patient has a good understanding of the overall plan. she agrees with it. she will call with any problems that may develop  before the next visit here. ? ?Total time spent: 30 mins including face to face time and time spent for planning, charting and coordination of care ? ?Rulon Eisenmenger, MD, MPH ?09/17/2021 ? ?I, Thana Ates, am acting as scribe for Dr. Nicholas Lose. ? ?I have reviewed the above documentation for accuracy and completeness, and I agree with the above. ? ? ? ? ? ? ?

## 2021-09-17 ENCOUNTER — Ambulatory Visit
Admission: RE | Admit: 2021-09-17 | Discharge: 2021-09-17 | Disposition: A | Discharge status: Home / Self Care | Payer: No Typology Code available for payment source | Source: Ambulatory Visit | Attending: Radiation Oncology | Admitting: Radiation Oncology

## 2021-09-17 ENCOUNTER — Inpatient Hospital Stay (HOSPITAL_BASED_OUTPATIENT_CLINIC_OR_DEPARTMENT_OTHER): Payer: No Typology Code available for payment source | Admitting: Hematology and Oncology

## 2021-09-17 DIAGNOSIS — Z171 Estrogen receptor negative status [ER-]: Secondary | ICD-10-CM | POA: Diagnosis not present

## 2021-09-17 DIAGNOSIS — C50411 Malignant neoplasm of upper-outer quadrant of right female breast: Secondary | ICD-10-CM

## 2021-09-17 DIAGNOSIS — C50911 Malignant neoplasm of unspecified site of right female breast: Secondary | ICD-10-CM | POA: Diagnosis not present

## 2021-09-17 MED ORDER — HYDROCORTISONE 5 MG PO TABS: ORAL_TABLET | ORAL | 3 refills | Status: DC

## 2021-09-17 NOTE — Assessment & Plan Note (Signed)
calcifications in the right breast, 8.2cm, with skin thickening and right axillary adenopathy. 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 ?? ?Treatment plan: ?1. Neoadjuvant chemotherapy with Adriamycin and Cytoxan?Keytruda??4 followed by Taxol weekly ?12 with carboplatin?and Keytruda?every 3 weeks x4?completed 04/18/2021?followed by 1 year of torted Keytruda maintenance ?2.?06/27/2021: Bilateral mastectomies: ?Left mastectomy: Benign ?Right mastectomy: Grade 3 IDC with high-grade DCIS 4.8 cm, margins negative, 8/21 lymph nodes positive ?3. Followed by adjuvant radiation therapy ?UR CC nausea study ?CT CAP 11/14/2020: Enlarged right axillary lymph nodes, prominent left axillary lymph node. ?(Requires an ultrasound) no evidence of metastatic disease in the abdomen or pelvis ?Bone scan 11/16/2020: No evidence of bone metastases ?--------------------------------------------------------------------------------------------------------------------------------------------------------- ?? 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 ?Immunotherapy has been discontinued. ?MRI brain: 05/24/2021: No focal masses, endocrinology follow-up ?? ?Treatment plan: ?1.??Adjuvant radiation to complete 09/20/21 ?2.?consideration for adjuvant Xeloda since immunotherapy has been discontinued. ?? ?Emergency room visit: For shortness of breath: CT angiogram: No PE and no intrathoracic abnormalities. ?Our plan is to perform CT scans every 6 months for the first 2 years. ?? ?Worsening shortness of breath: ?

## 2021-09-18 ENCOUNTER — Ambulatory Visit: Payer: No Typology Code available for payment source | Admitting: Internal Medicine

## 2021-09-18 ENCOUNTER — Ambulatory Visit: Payer: No Typology Code available for payment source

## 2021-09-18 ENCOUNTER — Telehealth: Payer: Self-pay | Admitting: Hematology and Oncology

## 2021-09-18 ENCOUNTER — Ambulatory Visit
Admission: RE | Admit: 2021-09-18 | Discharge: 2021-09-18 | Disposition: A | Discharge status: Home / Self Care | Payer: No Typology Code available for payment source | Source: Ambulatory Visit | Attending: Radiation Oncology | Admitting: Radiation Oncology

## 2021-09-18 DIAGNOSIS — C50911 Malignant neoplasm of unspecified site of right female breast: Secondary | ICD-10-CM | POA: Diagnosis not present

## 2021-09-18 DIAGNOSIS — Z171 Estrogen receptor negative status [ER-]: Secondary | ICD-10-CM

## 2021-09-18 DIAGNOSIS — C50411 Malignant neoplasm of upper-outer quadrant of right female breast: Secondary | ICD-10-CM

## 2021-09-18 MED ORDER — SILVER SULFADIAZINE 1 % EX CREA
1.0000 "application " | TOPICAL_CREAM | Freq: Once | CUTANEOUS | Status: AC
  Administered 2021-09-18: 1 via TOPICAL

## 2021-09-18 NOTE — Telephone Encounter (Signed)
Scheduled appointment per 3/7 los. Patient is aware. ?

## 2021-09-19 ENCOUNTER — Ambulatory Visit
Admission: RE | Admit: 2021-09-19 | Discharge: 2021-09-19 | Disposition: A | Discharge status: Home / Self Care | Payer: No Typology Code available for payment source | Source: Ambulatory Visit | Attending: Radiation Oncology | Admitting: Radiation Oncology

## 2021-09-19 ENCOUNTER — Other Ambulatory Visit: Payer: Self-pay

## 2021-09-19 DIAGNOSIS — C50911 Malignant neoplasm of unspecified site of right female breast: Secondary | ICD-10-CM | POA: Diagnosis not present

## 2021-09-20 ENCOUNTER — Ambulatory Visit
Admission: RE | Admit: 2021-09-20 | Discharge: 2021-09-20 | Disposition: A | Discharge status: Home / Self Care | Payer: No Typology Code available for payment source | Source: Ambulatory Visit | Attending: Radiation Oncology | Admitting: Radiation Oncology

## 2021-09-20 ENCOUNTER — Encounter: Payer: Self-pay | Admitting: Radiation Oncology

## 2021-09-20 ENCOUNTER — Encounter: Payer: Self-pay | Admitting: *Deleted

## 2021-09-20 ENCOUNTER — Ambulatory Visit: Payer: No Typology Code available for payment source | Admitting: Internal Medicine

## 2021-09-20 DIAGNOSIS — Z171 Estrogen receptor negative status [ER-]: Secondary | ICD-10-CM

## 2021-09-20 DIAGNOSIS — C50411 Malignant neoplasm of upper-outer quadrant of right female breast: Secondary | ICD-10-CM

## 2021-09-20 DIAGNOSIS — C50911 Malignant neoplasm of unspecified site of right female breast: Secondary | ICD-10-CM | POA: Diagnosis not present

## 2021-09-20 NOTE — Progress Notes (Deleted)
?Cardiology Office Note:   ? ?Date:  09/20/2021  ? ?ID:  Amber White, DOB [birth year], MRN [id number] ? ?PCP:  [name], MD ?  ?[facility] Providers ?Cardiologist:  Pixie Casino, MD { ?Click to update primary MD,subspecialty MD or APP then REFRESH:1}   ? ?Referring MD: [name], MD  ? ?No chief complaint on file. ?*** ? ?History of Present Illness:   ? ?Amber White is a 46 y.o. female with a hx of *** ? ?Past Medical History:  ?Diagnosis Date  ? Adrenal insufficiency ([place]) 05/2021  ? in setting of chemotherapy  ? breast ca dx'd 10/2020  ? right  ? 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 Family history of breast cancer   ? Peripheral neuropathy 04/2021  ? likely related to Taxol  ? ? ?Past Surgical History:  ?Procedure Laterality Date  ? CHOLECYSTECTOMY    ? MODIFIED MASTECTOMY Right 06/27/2021  ? Procedure: RIGHT MODIFIED RADICAL MASTECTOMY;  Surgeon: Donnie Mesa, MD;  Location: [facility];  Service: General;  Laterality: Right;  ? PORTACATH PLACEMENT N/A 11/21/2020  ? Procedure: INSERTION PORT-A-CATH;  Surgeon: Donnie Mesa, MD;  Location: [facility];  Service: General;  Laterality: N/A;  ? TOTAL MASTECTOMY Left 06/27/2021  ? Procedure: LEFT TOTAL MASTECTOMY;  Surgeon: Donnie Mesa, MD;  Location: [facility];  Service: General;  Laterality: Left;  ? ? ?Current Medications: ?No outpatient medications have been marked as taking for the 09/20/21 encounter (Appointment) with Janina Mayo, MD.  ?  ? ?Allergies:   Paclitaxel and Penicillins  ? ?Social History  ? ?Socioeconomic History  ? Marital status: Single  ?  Spouse name: Not on file  ? Number of children: 4  ? Years of education: Not on file  ? Highest education level: Not on file  ?Occupational History  ? Not on file  ?Tobacco Use  ? Smoking status: Never  ? Smokeless tobacco: Never  ?Vaping Use  ? Vaping Use: Never used  ?Substance and Sexual Activity  ? Alcohol use: Yes  ?  Comment: rare  ? Drug use: No  ?  Sexual activity: Yes  ?  Birth control/protection: None  ?Other Topics Concern  ? Not on file  ?Social History Narrative  ? Not on file  ? ?Social Determinants of Health  ? ?Financial Resource Strain: Not on file  ?Food Insecurity: Not on file  ?Transportation Needs: Not on file  ?Physical Activity: Not on file  ?Stress: Not on file  ?Social Connections: Not on file  ?  ? ?Family History: ?The patient's ***family history includes Breast cancer in her mother; Diabetes in her maternal grandmother and maternal uncle; Heart disease in her maternal grandmother. ? ?ROS:   ?Please see the history of present illness.    ?*** All other systems reviewed and are negative. ? ?EKGs/Labs/Other Studies Reviewed:   ? ?The following studies were reviewed today: ?*** ? ?EKG:  EKG is *** ordered today.  The ekg ordered today demonstrates *** ? ?Recent Labs: ?05/21/2021: Magnesium 1.7; TSH 0.172 ?09/09/2021: ALT 13; B Natriuretic Peptide 57.8; BUN 8; Creatinine, Ser 0.78; Hemoglobin 10.6; Platelets 252; Potassium 3.3; Sodium 135  ?Recent Lipid Panel ?No results found for: CHOL, TRIG, HDL, CHOLHDL, VLDL, LDLCALC, LDLDIRECT ? ? ?Risk Assessment/Calculations:   ?{Does this patient have ATRIAL FIBRILLATION?:[phone]} ? ?    ? ?Physical Exam:   ? ?VS:  There were no vitals taken for this visit.   ? ?  Wt Readings from Last 3 Encounters:  ?09/17/21 260 lb 1.6 oz (118 kg)  ?09/11/21 261 lb 1.6 oz (118.4 kg)  ?09/09/21 263 lb (119.3 kg)  ?  ? ?GEN: *** Well nourished, well developed in no acute distress ?HEENT: Normal ?NECK: No JVD; No carotid bruits ?LYMPHATICS: No lymphadenopathy ?CARDIAC: ***RRR, no murmurs, rubs, gallops ?RESPIRATORY:  Clear to auscultation without rales, wheezing or rhonchi  ?ABDOMEN: Soft, non-tender, non-distended ?MUSCULOSKELETAL:  No edema; No deformity  ?SKIN: Warm and dry ?NEUROLOGIC:  Alert and oriented x 3 ?PSYCHIATRIC:  Normal affect  ? ?ASSESSMENT:   ? ?#Cancer Therapy Related-Cardiovascular Toxicity ?- change  bisoprolol to carvedilol ?- if BP room would add losartan ?- repeat echo within one year of therapy cessation ? ? ? ?PLAN:   ? ?In order of problems listed above: ? ?*** ? ?   ? ?{Are you ordering a CV Procedure (e.g. stress test, cath, DCCV, TEE, etc)?   Press F2        :[id number]}  ? ? ?Medication Adjustments/Labs and Tests Ordered: ?Current medicines are reviewed at length with the patient today.  Concerns regarding medicines are outlined above.  ?No orders of the defined types were placed in this encounter. ? ?No orders of the defined types were placed in this encounter. ? ? ?There are no Patient Instructions on file for this visit.  ? ?Signed, ?Janina Mayo, MD  ?09/20/2021 3:15 PM    ?[place] ?

## 2021-09-25 ENCOUNTER — Other Ambulatory Visit: Payer: Self-pay

## 2021-09-25 ENCOUNTER — Encounter: Payer: Self-pay | Admitting: Internal Medicine

## 2021-09-25 ENCOUNTER — Ambulatory Visit (INDEPENDENT_AMBULATORY_CARE_PROVIDER_SITE_OTHER): Payer: No Typology Code available for payment source | Admitting: Plastic Surgery

## 2021-09-25 ENCOUNTER — Encounter: Payer: Self-pay | Admitting: Plastic Surgery

## 2021-09-25 VITALS — BP 110/75 | HR 87 | Ht 64.0 in | Wt 260.8 lb

## 2021-09-25 DIAGNOSIS — C50411 Malignant neoplasm of upper-outer quadrant of right female breast: Secondary | ICD-10-CM | POA: Diagnosis not present

## 2021-09-25 DIAGNOSIS — Z171 Estrogen receptor negative status [ER-]: Secondary | ICD-10-CM

## 2021-09-25 NOTE — Progress Notes (Signed)
? ?Referring Provider ?[name], MD ?[address] ?[place],   [address]  ? ?CC:  ?Chief Complaint  ?Patient presents with  ? Advice Only  ?   ? ?Amber White is an 46 y.o. female.  ?HPI: Patient presents to discuss breast reconstruction.  She was diagnosed last year with a right-sided breast cancer with lymph node metastasis.  She underwent neoadjuvant chemotherapy followed by bilateral mastectomies and subsequently adjuvant radiation on the right side.  She completed radiation just last week and is also finished with her chemotherapy.  She still has her port but she is planning to have it removed shortly.  It sounds like she has had some side effects from chemotherapy including neuropathy and adrenal insufficiency which altered the course of her treatment.  She does not smoke and is not a diabetic. ? ?Allergies  ?Allergen Reactions  ? Paclitaxel Nausea Only and Other (See Comments)  ?  Complained of chest burning and nausea. Resolved with famotidine, methylprednisolone, and diphenhydramine. Resumed paclitaxel and completed without further incident.   ? Penicillins Hives  ? ? ?Outpatient Encounter Medications as of 09/25/2021  ?Medication Sig  ? ADDERALL XR 30 MG 24 hr capsule Take 30 mg by mouth every morning.  ? bisoprolol (ZEBETA) 10 MG tablet TAKE 1 TABLET BY MOUTH EVERY DAY  ? hydrocortisone (CORTEF) 5 MG tablet Take 2 tablets (10 mg total) by mouth in the morning AND 1 tablet (5 mg total) daily before supper.  ? [DISCONTINUED] omeprazole (PRILOSEC) 40 MG capsule TAKE 1 CAPSULE (40 MG TOTAL) BY MOUTH DAILY. (Patient not taking: Reported on 08/23/2021)  ? [DISCONTINUED] potassium chloride (KLOR-CON) 10 MEQ tablet Take 1 tablet (10 mEq total) by mouth 2 (two) times daily. (Patient not taking: Reported on 09/25/2021)  ? [DISCONTINUED] prochlorperazine (COMPAZINE) 10 MG tablet Take 1 tablet (10 mg total) by mouth every 6 (six) hours as needed (Nausea or vomiting).  ? ?Facility-Administered Encounter  Medications as of 09/25/2021  ?Medication  ? filgrastim-aafi (NIVESTYM) 480 MCG/0.8ML injection  ?  ? ?Past Medical History:  ?Diagnosis Date  ? Adrenal insufficiency ([place]) 05/2021  ? in setting of chemotherapy  ? breast ca dx'd 10/2020  ? right  ? Cardiomyopathy ([place]) 05/2021  ? 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 Family history of breast cancer   ? Peripheral neuropathy 04/2021  ? likely related to Taxol  ? ? ?Past Surgical History:  ?Procedure Laterality Date  ? CHOLECYSTECTOMY    ? MODIFIED MASTECTOMY Right 06/27/2021  ? Procedure: RIGHT MODIFIED RADICAL MASTECTOMY;  Surgeon: Donnie Mesa, MD;  Location: [facility];  Service: General;  Laterality: Right;  ? PORTACATH PLACEMENT N/A 11/21/2020  ? Procedure: INSERTION PORT-A-CATH;  Surgeon: Donnie Mesa, MD;  Location: [facility];  Service: General;  Laterality: N/A;  ? TOTAL MASTECTOMY Left 06/27/2021  ? Procedure: LEFT TOTAL MASTECTOMY;  Surgeon: Donnie Mesa, MD;  Location: [facility];  Service: General;  Laterality: Left;  ? ? ?Family History  ?Problem Relation Age of Onset  ? Breast cancer Mother   ? Diabetes Maternal Uncle   ? Diabetes Maternal Grandmother   ? Heart disease Maternal Grandmother   ? ? ?Social History  ? ?Social History Narrative  ? Not on file  ?  ? ?Review of Systems ?General: Denies fevers, chills, weight loss ?CV: Denies chest pain, shortness of breath, palpitations ? ?Physical Exam ?Vitals with BMI 09/25/2021 09/17/2021 09/11/2021  ?Height '5\' 4"'$  '5\' 4"'$  '5\' 4"'$   ?Weight 260 lbs 13  oz 260 lbs 2 oz 261 lbs 2 oz  ?BMI 44.74 44.62 44.8  ?Systolic 884 166 063  ?Diastolic 75 82 69  ?Pulse 87 84 86  ?  ?General:  No acute distress,  Alert and oriented, Non-Toxic, Normal speech and affect ?Breast: She status post bilateral mastectomies.  She does have some excess skin on each side.  She has readily apparent radiation changes on the right which is not unexpected given the fact she just completed last week.  There is some small ulcers along the  inframammary fold on the right side.  No scars on her back. ? ?Assessment/Plan ?Patient presents for delayed breast reconstruction after bilateral mastectomy with adjuvant radiation on the right side.  We discussed latissimus flap on the right combined with placement of bilateral tissue expanders.  After inflation of the tissue expanders they can be switched out to gel implants at a second surgery.  We discussed risks of this procedure that include bleeding, infection, damage to surrounding structures and need for additional procedures.  I discussed the location and orientation of the scars on her back.  We discussed risk factors and for her BMI would be a risk factor.  I would like to wait several more months to allow some of the radiation changes to dissipate.  I am planning to see her again in 4 months to reevaluate and discuss this plan further to make sure we are both still in agreement.  At that point if everything lines up we will plan to start the reconstruction around 6 months after finishing radiation.  She is in agreement with this plan and all her questions were answered. ? ?Cindra Presume ?09/25/2021, 9:43 AM  ? ? ?  ?

## 2021-09-30 ENCOUNTER — Ambulatory Visit: Payer: Self-pay | Admitting: Surgery

## 2021-10-01 ENCOUNTER — Other Ambulatory Visit: Payer: Self-pay | Admitting: Hematology and Oncology

## 2021-10-10 ENCOUNTER — Encounter: Payer: Self-pay | Admitting: Radiation Oncology

## 2021-10-11 ENCOUNTER — Telehealth: Payer: Self-pay | Admitting: Hematology and Oncology

## 2021-10-11 ENCOUNTER — Other Ambulatory Visit: Payer: Self-pay

## 2021-10-11 ENCOUNTER — Telehealth: Payer: Self-pay

## 2021-10-11 ENCOUNTER — Ambulatory Visit
Admission: RE | Admit: 2021-10-11 | Discharge: 2021-10-11 | Disposition: A | Discharge status: Home / Self Care | Payer: No Typology Code available for payment source | Source: Ambulatory Visit | Attending: Radiation Oncology | Admitting: Radiation Oncology

## 2021-10-11 ENCOUNTER — Other Ambulatory Visit: Payer: Self-pay | Admitting: Radiation Oncology

## 2021-10-11 DIAGNOSIS — C50911 Malignant neoplasm of unspecified site of right female breast: Secondary | ICD-10-CM | POA: Diagnosis not present

## 2021-10-11 DIAGNOSIS — I429 Cardiomyopathy, unspecified: Secondary | ICD-10-CM

## 2021-10-11 DIAGNOSIS — Z171 Estrogen receptor negative status [ER-]: Secondary | ICD-10-CM

## 2021-10-11 LAB — CMP (CANCER CENTER ONLY)
ALT: 7 U/L (ref 0–44)
AST: 14 U/L — ABNORMAL LOW (ref 15–41)
Albumin: 3.7 g/dL (ref 3.5–5.0)
Alkaline Phosphatase: 46 U/L (ref 38–126)
Anion gap: 6 (ref 5–15)
BUN: 8 mg/dL (ref 6–20)
CO2: 28 mmol/L (ref 22–32)
Calcium: 9.3 mg/dL (ref 8.9–10.3)
Chloride: 107 mmol/L (ref 98–111)
Creatinine: 0.66 mg/dL (ref 0.44–1.00)
GFR, Estimated: >60 mL/min (ref >60)
Glucose, Bld: 82 mg/dL (ref 70–99)
Potassium: 3.7 mmol/L (ref 3.5–5.1)
Sodium: 141 mmol/L (ref 135–145)
Total Bilirubin: 0.4 mg/dL (ref 0.3–1.2)
Total Protein: 7.8 g/dL (ref 6.5–8.1)

## 2021-10-11 LAB — CORTISOL: Cortisol, Plasma: 0.8 ug/dL

## 2021-10-11 LAB — URINALYSIS, COMPLETE (UACMP) WITH MICROSCOPIC
Bacteria, UA: NONE SEEN
Bilirubin Urine: NEGATIVE
Glucose, UA: NEGATIVE mg/dL
Hgb urine dipstick: NEGATIVE
Ketones, ur: NEGATIVE mg/dL
Nitrite: NEGATIVE
Protein, ur: NEGATIVE mg/dL
Specific Gravity, Urine: 1.014 (ref 1.005–1.030)
pH: 6 (ref 5.0–8.0)

## 2021-10-11 LAB — CBC WITH DIFFERENTIAL (CANCER CENTER ONLY)
Abs Immature Granulocytes: 0.01 10*3/uL (ref 0.00–0.07)
Basophils Absolute: 0 10*3/uL (ref 0.0–0.1)
Basophils Relative: 0 %
Eosinophils Absolute: 0.1 10*3/uL (ref 0.0–0.5)
Eosinophils Relative: 3 %
HCT: 31.5 % — ABNORMAL LOW (ref 36.0–46.0)
Hemoglobin: 10.1 g/dL — ABNORMAL LOW (ref 12.0–15.0)
Immature Granulocytes: 0 %
Lymphocytes Relative: 42 %
Lymphs Abs: 1.5 10*3/uL (ref 0.7–4.0)
MCH: 29.2 pg (ref 26.0–34.0)
MCHC: 32.1 g/dL (ref 30.0–36.0)
MCV: 91 fL (ref 80.0–100.0)
Monocytes Absolute: 0.3 10*3/uL (ref 0.1–1.0)
Monocytes Relative: 8 %
Neutro Abs: 1.7 10*3/uL (ref 1.7–7.7)
Neutrophils Relative %: 47 %
Platelet Count: 261 10*3/uL (ref 150–400)
RBC: 3.46 MIL/uL — ABNORMAL LOW (ref 3.87–5.11)
RDW: 13.2 % (ref 11.5–15.5)
WBC Count: 3.5 10*3/uL — ABNORMAL LOW (ref 4.0–10.5)
nRBC: 0 % (ref 0.0–0.2)

## 2021-10-11 LAB — SAMPLE TO BLOOD BANK

## 2021-10-11 MED ORDER — FLUCONAZOLE 150 MG PO TABS: 150.0000 mg | ORAL_TABLET | Freq: Every day | ORAL | 0 refills | Status: DC

## 2021-10-11 NOTE — Telephone Encounter (Signed)
Rescheduled appointment per 3/31 secure chat per [place]. Patient is aware of the changes to her upcoming appointment. ?

## 2021-10-11 NOTE — Telephone Encounter (Signed)
Called patient to follow up with recent my chart message. Patient reports having dysuria and urinary urgency and frequency. Patient also reports having symptoms of a yeast infection. Dr. Sondra Come to send patient a prescription to [facility] on [address] for yeast infection. Dr. Sondra Come suggest patient to come in for UA  C&S. Awaiting call back from patient to determine when she can come in to do UA C&S.  ?

## 2021-10-12 ENCOUNTER — Emergency Department (HOSPITAL_COMMUNITY)
Admission: EM | Admit: 2021-10-12 | Discharge: 2021-10-12 | Disposition: A | Discharge status: Home / Self Care | Payer: No Typology Code available for payment source | Attending: Emergency Medicine | Admitting: Emergency Medicine

## 2021-10-12 ENCOUNTER — Encounter (HOSPITAL_COMMUNITY): Payer: Self-pay

## 2021-10-12 ENCOUNTER — Other Ambulatory Visit: Payer: Self-pay

## 2021-10-12 DIAGNOSIS — M7989 Other specified soft tissue disorders: Secondary | ICD-10-CM | POA: Diagnosis present

## 2021-10-12 NOTE — ED Triage Notes (Addendum)
Reports right arm swelling x 2 days.  ? ?Had double mastectomy and a right sided lumpectomy in December of 2022. Scheduled to have cath removed from right chest 4/25.  ?

## 2021-10-12 NOTE — ED Provider Notes (Signed)
?[facility] ?Provider Note ? ? ?CSN: [id number] ?Arrival date & time: 10/12/21  2200 ? ?  ? ?History ? ?Chief Complaint  ?Patient presents with  ? Arm Swelling  ? ? ?Amber White is a 46 y.o. female. ? ?HPI ?Patient is here for evaluation of swelling of her l right arm with some drainage in the right axilla.  Patient states she did some research on the Internet and thinks that she has lymphedema of her right arm.  2 weeks ago she was treated with a sulfa medication for infection in the right axilla.  She had labs done yesterday, including a urinalysis and culture.  Her radiation oncologist also sent a prescription in for Diflucan to treat her symptoms of "yeast infection." ?  ? ?Home Medications ?Prior to Admission medications   ?Medication Sig Start Date End Date Taking? Authorizing Provider  ?ADDERALL XR 30 MG 24 hr capsule Take 30 mg by mouth every morning. 03/19/21   [provider]  ?bisoprolol (ZEBETA) 10 MG tablet TAKE 1 TABLET BY MOUTH EVERY DAY 10/01/21   Nicholas Lose, MD  ?fluconazole (DIFLUCAN) 150 MG tablet Take 1 tablet (150 mg total) by mouth daily. Take second tablet 72 hours later 10/11/21   Gery Pray, MD  ?hydrocortisone (CORTEF) 5 MG tablet Take 2 tablets (10 mg total) by mouth in the morning AND 1 tablet (5 mg total) daily before supper. 09/17/21   Nicholas Lose, MD  ?prochlorperazine (COMPAZINE) 10 MG tablet Take 1 tablet (10 mg total) by mouth every 6 (six) hours as needed (Nausea or vomiting). 11/07/20 04/25/21  Nicholas Lose, MD  ?   ? ?Allergies    ?Paclitaxel and Penicillins   ? ?Review of Systems   ?Review of Systems ? ?Physical Exam ?Updated Vital Signs ?BP (!) 130/101 (BP Location: Left Arm)   Pulse 95   Temp 98.3 ?F (36.8 ?C) (Oral)   Resp 18   Ht '5\' 4"'$  (1.626 m)   Wt 117.9 kg   SpO2 100%   BMI 44.63 kg/m?  ?Physical Exam ?Vitals and nursing note reviewed.  ?Constitutional:   ?   General: She is not in acute distress. ?   Appearance: She is  well-developed. She is obese. She is not ill-appearing or toxic-appearing.  ?HENT:  ?   Head: Normocephalic and atraumatic.  ?   Right Ear: External ear normal.  ?   Left Ear: External ear normal.  ?Eyes:  ?   Conjunctiva/sclera: Conjunctivae normal.  ?   Pupils: Pupils are equal, round, and reactive to light.  ?Neck:  ?   Trachea: Phonation normal.  ?Cardiovascular:  ?   Rate and Rhythm: Normal rate.  ?Pulmonary:  ?   Effort: Pulmonary effort is normal.  ?Musculoskeletal:  ?   Cervical back: Normal range of motion and neck supple.  ?   Comments: Right arm with nonspecific swelling between the right shoulder and right elbow.  No skin changes of the right arm.  Right axilla is remarkable for irregular architecture and slightly moist skin, without clear locus for drainage, and no evidence for purulent material in the right axilla.  This area is slightly tender to palpation and movement.  She guards against full extension and abduction of the right arm secondary to prior surgical changes.  ?Skin: ?   General: Skin is warm and dry.  ?Neurological:  ?   Mental Status: She is alert and oriented to person, place, and time.  ?   Cranial  Nerves: No cranial nerve deficit.  ?   Sensory: No sensory deficit.  ?   Motor: No abnormal muscle tone.  ?   Coordination: Coordination normal.  ?Psychiatric:     ?   Mood and Affect: Mood normal.     ?   Behavior: Behavior normal.     ?   Thought Content: Thought content normal.     ?   Judgment: Judgment normal.  ? ? ?ED Results / Procedures / Treatments   ?Labs ?(all labs ordered are listed, but only abnormal results are displayed) ?Labs Reviewed - No data to display ? ?EKG ?None ? ?Radiology ?No results found. ? ?Procedures ?Procedures  ? ? ?Medications Ordered in ED ?Medications - No data to display ? ?ED Course/ Medical Decision Making/ A&P ?  ?                        ?Medical Decision Making ?Patient presenting with right arm swelling.  She has had prior mastectomy, left total  dissection and radiation therapy.  She was treated for a right axilla infection with a sulfa medication, 2 weeks ago.  She does not have any shortness of breath, or intrathoracic symptoms.  She has been having waxing and waning swelling of the right arm for about a month. ? ?Problems Addressed: ?Swelling of arm: undiagnosed new problem with uncertain prognosis ?   Details: On and off swelling for 1 month.  No recent trauma.  No overt signs for cellulitis or systemic infection. ? ?Amount and/or Complexity of Data Reviewed ?Independent Historian:  ?   Details: She is a cogent historian ?External Data Reviewed: labs and notes. ?   Details: Patient had blood labs and urine yesterday, that were nondiagnostic.  Urine culture is pending.  She was not seen by a provider yesterday.  She was seen by her plastic surgeon, 2 weeks ago.  At that time plans were made for ongoing management and possible breast reconstruction. ? ?Risk ?Decision regarding hospitalization. ?Risk Details: Patient with nonspecific symptoms, and reassuring findings.  She was offered ultrasound imaging to be done tomorrow morning to evaluate for DVT.  She declined that and stated that she would follow-up with her PCP in 2 days for further care and treatment.  There does not appear to be any infection in the arm, the axilla or chest.  Doubt venous thromboembolism at this time.  No clinical evidence for recurrence of breast cancer.  No indication for further ED treatment or hospitalization at this time. ? ? ? ? ? ? ? ? ? ? ?Final Clinical Impression(s) / ED Diagnoses ?Final diagnoses:  ?Swelling of arm  ? ? ?Rx / DC Orders ?ED Discharge Orders   ? ? None  ? ?  ? ? ?  ?Daleen Bo, MD ?10/12/21 2245 ? ?

## 2021-10-12 NOTE — Discharge Instructions (Signed)
As we discussed, there is no clear sign for infection in the right axilla at this time.  It is not clear what is causing the swelling of your right arm.  Some of the considerations include lymphedema and blood clot in the right arm.  We discussed getting a ultrasound of the right arm, to rule out DVT.  You have chosen for [place] to not order that test which could be done tomorrow morning.  Make sure you follow-up with your primary care doctor early next week for further evaluation and treatment.  They can help you with further testing and treatment as needed.  We reviewed the laboratory testing, done by Dr. Sofie Hartigan, which did not show a clear sign of urinary tract infection.  The urine culture is still pending and should be done by Monday.  Since you are having some burning when you urinate, if the culture shows a bacteria, at that time you will be prescribed an antibiotic.  To help the swelling of your right arm, try to elevate it above your heart is much as possible.  You can use a warm compress on your right axilla, 3-4 times a day to help with drainage, by keeping the area clean.  Also wash the area with soap and water at least twice a day. ?

## 2021-10-13 LAB — URINE CULTURE: Culture: NO GROWTH

## 2021-10-14 ENCOUNTER — Other Ambulatory Visit: Payer: Self-pay | Admitting: *Deleted

## 2021-10-14 ENCOUNTER — Encounter: Payer: Self-pay | Admitting: Hematology and Oncology

## 2021-10-14 MED ORDER — HYDROCORTISONE 5 MG PO TABS
ORAL_TABLET | ORAL | 3 refills | Status: DC
Start: 2021-10-14 — End: 2022-05-16

## 2021-10-14 NOTE — Telephone Encounter (Signed)
Called to make patient aware of negative urinalysis per Dr. Sondra Come. Patient reports continuing to have dysuria and frequency. Reports taking diflucan as instructed and AZO.  ?

## 2021-10-18 ENCOUNTER — Encounter: Payer: Self-pay | Admitting: Radiology

## 2021-10-20 NOTE — Progress Notes (Incomplete)
?  Radiation Oncology         [phone] ?________________________________ ? ?Patient Name: Amber White ?MRN: [id number] ?DOB: 04-07-1976 ?Referring Physician: Nicholas Lose (Profile Not Attached) ?Date of Service: 09/20/2021 ?[facility], [place] ? ? ?End Of Treatment Note ? ? 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 ?Cancer Staging:  Status post bilateral mastectomies: Stage ypT2, ypN2a   Right Breast UOQ, Residual invasive ductal carcinoma, ad high-grade DCIS, ER+ / PR- / Her2 pending, Grade 3 ? ?Intent: Curative ? ?Radiation Treatment Dates: 08/12/2021 through 09/20/2021 ? ? ? ? ?Narrative: The patient tolerated radiation therapy relatively well. During her final weekly treatment check on 09/18/21, the patient reported mild fatigue, and hyperpigmentation for which she uses hydrocortisone with relief. Physical exam performed on that same date revealed moist desquamation to the right axillary area. The remainder of the treatment area showed hyperpigmentation changes and some dry desquamation. She uses radiaplex as directed for this.  ? ?The patient continued to have drainage from her incision site during treatment, and reported taking oral antibiotics for this as prescribed.  ? ? ?Plan:  She will continue on oral antibiotics.  I have given her Silvadene to place in the axillary area where she has developed moist desquamation.  This is in the area where she previously had some drainage and positive culture.   Also recommended she obtain some Neosporin ointment to place in the axillary area.  She continues to complain of decreased sensation in this area so she is not feeling any discomfort related to her and breakdown. She will follow-up with radiation oncology in one month or sooner if necessary.  ?________________________________________________ ?----------------------------------- ? ?Blair Promise, PhD,  MD ? ?This document serves as a record of services personally performed by Gery Pray, MD. It was created on his behalf by Roney Mans, a trained medical scribe. The creation of this record is based on the scribe's personal observations and the provider's statements to them. This document has been checked and approved by the attending provider. ? ?

## 2021-10-21 ENCOUNTER — Ambulatory Visit
Admission: RE | Admit: 2021-10-21 | Discharge: 2021-10-21 | Disposition: A | Discharge status: Home / Self Care | Payer: No Typology Code available for payment source | Source: Ambulatory Visit | Attending: Radiation Oncology | Admitting: Radiation Oncology

## 2021-10-21 ENCOUNTER — Encounter: Payer: Self-pay | Admitting: Hematology and Oncology

## 2021-10-21 NOTE — Progress Notes (Incomplete)
?Radiation Oncology         [phone] ?________________________________ ? ?Name: Amber White MRN: [id number]  ?Date: 10/21/2021  DOB: 1975/12/23 ? ?Follow-Up Visit Note ? ?CC: Hague, Rosalyn Charters, MD  Nicholas Lose, MD ? ?No diagnosis found. ? ?Diagnosis: Status post bilateral mastectomies: Stage ypT2, ypN2a   Right Breast UOQ, Residual invasive ductal carcinoma, ad high-grade DCIS, ER+ / PR- / Her2 pending, Grade 3 ? ?Interval Since Last Radiation: 1 month ? ?Intent: Curative ?  ?Radiation Treatment Dates: 08/12/2021 through 09/20/2021 ?  ?  ? ?Narrative:  The patient returns today for routine follow-up. The patient tolerated radiation therapy relatively well. During her final weekly treatment check on 09/18/21, the patient reported mild fatigue, and hyperpigmentation for which she used hydrocortisone with relief. Physical exam performed on that same date revealed moist desquamation to the right axillary area. The remainder of the treatment area showed hyperpigmentation changes and some dry desquamation. She used radiaplex as directed for this. The patient continued to have drainage from her incision site during treatment, and reported taking oral antibiotics for this as prescribed which she continued following RT. I had also given her Silvadene to place in the axillary area where she had developed moist desquamation.  This was also in the area where she previously had some drainage and a positive culture.  Also recommended she obtain some Neosporin ointment to place in the axillary area.  Near the end of treatment, she continued to complain of decreased sensation to this area so she was not feeling any discomfort related to the breakdown.      ? ?On 09/09/21, the patient presented to the ED with weakness and SOB for several weeks. 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 Chest x-ray performed was reassuring, with no  infiltrates or edema.  BNP was normal and D-dimer was slightly elevated. Slight anemia was also noted.  Chest CT performed showed no acute cause of her dyspnea and no evidence of PE. Initial differential diagnoses were noted to include pneumonia, heart failure, and PE.  She was discharged home with potassium supplement and instructions for OP follow up.  ? ?The patient accordingly met with Dr. Lindi Adie on 09/11/21 for follow up. 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 The patient also endorsed lightheadedness due to the same. Given her intolerance of immunotherapy, Dr. Lindi Adie recommended the patient to consider Xeloda. However because of patient's profound fatigue, lightheadedness, and feelings of passing out due to adrenal insufficiency, Dr. Lindi Adie decided to postpone Xeloda until the above symptoms subside. Her hydrocortisone was also refilled during this visit (for adrenal insufficiency).   ? ?She again followed up with Dr. Lindi Adie on 09/17/21 and reported remarkable improvement in her breathing, energy levels, and overall health since restarting her hydrocortisone.  ? ?The patient also followed up with plastic surgery on 09/25/21 to discuss reconstructive surgery. Given her radiation changes and BMI, she was advised to wait several more months to allow for the radiation changes to dissipate. She will return to plastic surgery in 4 months for re-evaluation, and if all goes well she may undergo reconstructive surgery 6 months after RT.  ? ?Of note: the patient recently presented to the ED on 10/12/21 with right arm swelling and some right axillary drainage.  2 weeks prior to this she was treated with a sulfa medication for infection in the right  axilla.  (She also had labs done on 10/11/21, including a urinalysis and culture.  As above, I had prescribed Diflucan to treat her symptoms of "yeast infection").  The patient was offered  [place] to evaluate for DVT but opted for follow up with her PCP. Given no signs of infection in the arm, axillary area, or chest, and no evidence of disease recurrence, she was discharged home without ED intervention.  ?                        ? ?Allergies:  is allergic to paclitaxel and penicillins. ? ?Meds: ?Current Outpatient Medications  ?Medication Sig Dispense Refill  ? ADDERALL XR 30 MG 24 hr capsule Take 30 mg by mouth every morning.    ? bisoprolol (ZEBETA) 10 MG tablet TAKE 1 TABLET BY MOUTH EVERY DAY 30 tablet 3  ? fluconazole (DIFLUCAN) 150 MG tablet Take 1 tablet (150 mg total) by mouth daily. Take second tablet 72 hours later 2 tablet 0  ? hydrocortisone (CORTEF) 5 MG tablet Take 2 tablets (10 mg total) by mouth in the morning AND 1 tablet (5 mg total) daily before supper. 90 tablet 3  ? ?No current facility-administered medications for this encounter.  ? ?Facility-Administered Medications Ordered in Other Encounters  ?Medication Dose Route Frequency Provider Last Rate Last Admin  ? filgrastim-aafi (NIVESTYM) 480 MCG/0.8ML injection           ? ? ?Physical Findings: ?The patient is in no acute distress. Patient is alert and oriented. ? vitals were not taken for this visit. .  No significant changes. Lungs are clear to auscultation bilaterally. Heart has regular rate and rhythm. No palpable cervical, supraclavicular, or axillary adenopathy. Abdomen soft, non-tender, normal bowel sounds. ? ?Right Breast: no palpable mass, nipple discharge or bleeding. *** ? ? ?Lab Findings: ?Lab Results  ?Component Value Date  ? WBC 3.5 (L) 10/11/2021  ? HGB 10.1 (L) 10/11/2021  ? HCT 31.5 (L) 10/11/2021  ? MCV 91.0 10/11/2021  ? PLT 261 10/11/2021  ? ? ?Radiographic Findings: ?No results found. ? ?Impression: Status post bilateral mastectomies: Stage ypT2, ypN2a   Right Breast UOQ, Residual invasive ductal carcinoma, ad high-grade DCIS, ER+ / PR- / Her2 pending, Grade 3 ? ?The patient is recovering from the effects of  radiation.  *** ? ?Plan:  *** ? ? ?*** minutes of total time was spent for this patient encounter, including preparation, face-to-face counseling with the patient and coordination of care, physical exam, and documentation of the encounter. ?____________________________________ ? ?Blair Promise, PhD, MD ? ?This document serves as a record of services personally performed by Gery Pray, MD. It was created on his behalf by Roney Mans, a trained medical scribe. The creation of this record is based on the scribe's personal observations and the provider's statements to them. This document has been checked and approved by the attending provider. ? ?

## 2021-10-28 ENCOUNTER — Encounter (HOSPITAL_BASED_OUTPATIENT_CLINIC_OR_DEPARTMENT_OTHER): Payer: Self-pay | Admitting: Surgery

## 2021-10-28 ENCOUNTER — Other Ambulatory Visit: Payer: Self-pay

## 2021-11-05 ENCOUNTER — Ambulatory Visit (HOSPITAL_BASED_OUTPATIENT_CLINIC_OR_DEPARTMENT_OTHER): Payer: No Typology Code available for payment source | Admitting: Anesthesiology

## 2021-11-05 ENCOUNTER — Encounter (HOSPITAL_BASED_OUTPATIENT_CLINIC_OR_DEPARTMENT_OTHER)
Admission: RE | Disposition: A | Discharge status: Home / Self Care | Payer: Self-pay | Source: Ambulatory Visit | Attending: Surgery

## 2021-11-05 ENCOUNTER — Ambulatory Visit (HOSPITAL_BASED_OUTPATIENT_CLINIC_OR_DEPARTMENT_OTHER)
Admission: RE | Admit: 2021-11-05 | Discharge: 2021-11-05 | Disposition: A | Discharge status: Home / Self Care | Payer: No Typology Code available for payment source | Source: Ambulatory Visit | Attending: Surgery | Admitting: Surgery

## 2021-11-05 ENCOUNTER — Encounter (HOSPITAL_BASED_OUTPATIENT_CLINIC_OR_DEPARTMENT_OTHER): Payer: Self-pay | Admitting: Surgery

## 2021-11-05 ENCOUNTER — Other Ambulatory Visit: Payer: Self-pay

## 2021-11-05 DIAGNOSIS — C50911 Malignant neoplasm of unspecified site of right female breast: Secondary | ICD-10-CM

## 2021-11-05 DIAGNOSIS — I10 Essential (primary) hypertension: Secondary | ICD-10-CM | POA: Diagnosis not present

## 2021-11-05 DIAGNOSIS — C773 Secondary and unspecified malignant neoplasm of axilla and upper limb lymph nodes: Secondary | ICD-10-CM

## 2021-11-05 DIAGNOSIS — Z452 Encounter for adjustment and management of vascular access device: Secondary | ICD-10-CM | POA: Insufficient documentation

## 2021-11-05 DIAGNOSIS — D630 Anemia in neoplastic disease: Secondary | ICD-10-CM | POA: Diagnosis not present

## 2021-11-05 DIAGNOSIS — Z9013 Acquired absence of bilateral breasts and nipples: Secondary | ICD-10-CM | POA: Diagnosis not present

## 2021-11-05 DIAGNOSIS — Z9221 Personal history of antineoplastic chemotherapy: Secondary | ICD-10-CM | POA: Insufficient documentation

## 2021-11-05 HISTORY — DX: Essential (primary) hypertension: I10

## 2021-11-05 HISTORY — PX: PORT-A-CATH REMOVAL: SHX5289

## 2021-11-05 HISTORY — DX: Dyspnea, unspecified: R06.00

## 2021-11-05 LAB — POCT PREGNANCY, URINE: Preg Test, Ur: NEGATIVE

## 2021-11-05 SURGERY — REMOVAL PORT-A-CATH
Anesthesia: Monitor Anesthesia Care | Site: Chest | Laterality: Right

## 2021-11-05 MED ORDER — FENTANYL CITRATE (PF) 100 MCG/2ML IJ SOLN
INTRAMUSCULAR | Status: DC | PRN
  Administered 2021-11-05: 50 ug via INTRAVENOUS

## 2021-11-05 MED ORDER — LACTATED RINGERS IV SOLN: INTRAVENOUS | Status: DC

## 2021-11-05 MED ORDER — CHLORHEXIDINE GLUCONATE CLOTH 2 % EX PADS
6.0000 | MEDICATED_PAD | Freq: Once | CUTANEOUS | Status: AC
Start: 2021-11-05 — End: 2021-11-05
  Administered 2021-11-05: 6 via TOPICAL

## 2021-11-05 MED ORDER — MIDAZOLAM HCL 5 MG/5ML IJ SOLN
INTRAMUSCULAR | Status: DC | PRN
  Administered 2021-11-05: 2 mg via INTRAVENOUS

## 2021-11-05 MED ORDER — MIDAZOLAM HCL 2 MG/2ML IJ SOLN
INTRAMUSCULAR | Status: AC
  Filled 2021-11-05: qty 2

## 2021-11-05 MED ORDER — LIDOCAINE HCL (CARDIAC) PF 100 MG/5ML IV SOSY
PREFILLED_SYRINGE | INTRAVENOUS | Status: DC | PRN
Start: 2021-11-05 — End: 2021-11-05
  Administered 2021-11-05: 40 mg via INTRAVENOUS

## 2021-11-05 MED ORDER — FENTANYL CITRATE (PF) 100 MCG/2ML IJ SOLN
INTRAMUSCULAR | Status: AC
  Filled 2021-11-05: qty 2

## 2021-11-05 MED ORDER — FENTANYL CITRATE (PF) 100 MCG/2ML IJ SOLN: 25.0000 ug | INTRAMUSCULAR | Status: DC | PRN

## 2021-11-05 MED ORDER — ONDANSETRON HCL 4 MG/2ML IJ SOLN
INTRAMUSCULAR | Status: AC
  Filled 2021-11-05: qty 2

## 2021-11-05 MED ORDER — BUPIVACAINE-EPINEPHRINE 0.25% -1:200000 IJ SOLN
INTRAMUSCULAR | Status: DC | PRN
  Administered 2021-11-05: 8 mL

## 2021-11-05 MED ORDER — PROPOFOL 10 MG/ML IV BOLUS
INTRAVENOUS | Status: DC | PRN
  Administered 2021-11-05: 30 mg via INTRAVENOUS

## 2021-11-05 MED ORDER — ACETAMINOPHEN 10 MG/ML IV SOLN: 1000.0000 mg | Freq: Once | INTRAVENOUS | Status: DC | PRN

## 2021-11-05 MED ORDER — CHLORHEXIDINE GLUCONATE CLOTH 2 % EX PADS: 6.0000 | MEDICATED_PAD | Freq: Once | CUTANEOUS | Status: DC

## 2021-11-05 MED ORDER — PROPOFOL 500 MG/50ML IV EMUL
INTRAVENOUS | Status: DC | PRN
Start: 2021-11-05 — End: 2021-11-05
  Administered 2021-11-05: 50 ug/kg/min via INTRAVENOUS

## 2021-11-05 SURGICAL SUPPLY — 44 items
APL PRP STRL LF DISP 70% ISPRP (MISCELLANEOUS) ×1
APL SKNCLS STERI-STRIP NONHPOA (GAUZE/BANDAGES/DRESSINGS) ×1
APL SWBSTK 6 STRL LF DISP (MISCELLANEOUS)
APPLICATOR COTTON TIP 6 STRL (MISCELLANEOUS) IMPLANT
APPLICATOR COTTON TIP 6IN STRL (MISCELLANEOUS)
BENZOIN TINCTURE PRP APPL 2/3 (GAUZE/BANDAGES/DRESSINGS) ×3 IMPLANT
BLADE HEX COATED 2.75 (ELECTRODE) IMPLANT
BLADE SURG 15 STRL LF DISP TIS (BLADE) ×2 IMPLANT
BLADE SURG 15 STRL SS (BLADE) ×2
CANISTER SUCT 1200ML W/VALVE (MISCELLANEOUS) IMPLANT
CHLORAPREP W/TINT 26 (MISCELLANEOUS) ×3 IMPLANT
COVER BACK TABLE 60X90IN (DRAPES) ×3 IMPLANT
COVER MAYO STAND STRL (DRAPES) ×3 IMPLANT
DRAPE LAPAROTOMY 100X72 PEDS (DRAPES) ×3 IMPLANT
DRAPE UTILITY XL STRL (DRAPES) ×3 IMPLANT
DRSG TEGADERM 2-3/8X2-3/4 SM (GAUZE/BANDAGES/DRESSINGS) ×1 IMPLANT
DRSG TEGADERM 4X4.75 (GAUZE/BANDAGES/DRESSINGS) IMPLANT
ELECT REM PT RETURN 9FT ADLT (ELECTROSURGICAL) ×2
ELECTRODE REM PT RTRN 9FT ADLT (ELECTROSURGICAL) ×2 IMPLANT
GAUZE SPONGE 4X4 12PLY STRL LF (GAUZE/BANDAGES/DRESSINGS) IMPLANT
GLOVE BIO SURGEON STRL SZ7 (GLOVE) ×4 IMPLANT
GLOVE BIOGEL PI IND STRL 7.0 (GLOVE) IMPLANT
GLOVE BIOGEL PI IND STRL 7.5 (GLOVE) ×2 IMPLANT
GLOVE BIOGEL PI INDICATOR 7.0 (GLOVE) ×1
GLOVE BIOGEL PI INDICATOR 7.5 (GLOVE) ×1
GOWN STRL REUS W/ TWL LRG LVL3 (GOWN DISPOSABLE) ×4 IMPLANT
GOWN STRL REUS W/TWL LRG LVL3 (GOWN DISPOSABLE) ×4
NDL HYPO 25X1 1.5 SAFETY (NEEDLE) ×2 IMPLANT
NEEDLE HYPO 25X1 1.5 SAFETY (NEEDLE) ×2 IMPLANT
NS IRRIG 1000ML POUR BTL (IV SOLUTION) IMPLANT
PACK BASIN DAY SURGERY FS (CUSTOM PROCEDURE TRAY) ×3 IMPLANT
PENCIL SMOKE EVACUATOR (MISCELLANEOUS) ×3 IMPLANT
SPIKE FLUID TRANSFER (MISCELLANEOUS) IMPLANT
SPONGE GAUZE 2X2 8PLY STRL LF (GAUZE/BANDAGES/DRESSINGS) ×1 IMPLANT
SPONGE T-LAP 4X18 ~~LOC~~+RFID (SPONGE) ×3 IMPLANT
STRIP CLOSURE SKIN 1/2X4 (GAUZE/BANDAGES/DRESSINGS) ×3 IMPLANT
SUT MON AB 4-0 PC3 18 (SUTURE) ×3 IMPLANT
SUT VIC AB 3-0 SH 27 (SUTURE) ×2
SUT VIC AB 3-0 SH 27X BRD (SUTURE) ×2 IMPLANT
SYR BULB EAR ULCER 3OZ GRN STR (SYRINGE) IMPLANT
SYR CONTROL 10ML LL (SYRINGE) ×3 IMPLANT
TOWEL GREEN STERILE FF (TOWEL DISPOSABLE) ×6 IMPLANT
TUBE CONNECTING 20X1/4 (TUBING) IMPLANT
YANKAUER SUCT BULB TIP NO VENT (SUCTIONS) IMPLANT

## 2021-11-05 NOTE — Anesthesia Preprocedure Evaluation (Addendum)
Anesthesia Evaluation  ?Patient identified by MRN, date of birth, ID band ?Patient awake ? ? ? ?Reviewed: ?Allergy & Precautions, NPO status , Patient's Chart, lab work & pertinent test results ? ?Airway ?Mallampati: II ? ?TM Distance: >3 FB ?Neck ROM: Full ? ? ? Dental ?no notable dental hx. ? ?  ?Pulmonary ?neg pulmonary ROS,  ?  ?Pulmonary exam normal ? ? ? ? ? ? ? Cardiovascular ?hypertension, Pt. on medications and Pt. on home beta blockers ? ?Rhythm:Regular Rate:Normal ? ? ?  ?Neuro/Psych ?negative neurological ROS ? negative psych ROS  ? GI/Hepatic ?negative GI ROS, Neg liver ROS,   ?Endo/Other  ?negative endocrine ROS ? Renal/GU ?negative Renal ROS  ?negative genitourinary ?  ?Musculoskeletal ?Breast Ca  ? Abdominal ?Normal abdominal exam  (+)   ?Peds ? Hematology ? ?(+) Blood dyscrasia, anemia ,   ?Anesthesia Other Findings ? ? Reproductive/Obstetrics ? ?  ? ? ? ? ? ? ? ? ? ? ? ? ? ?  ?  ? ? ? ? ? ? ? ? ?Anesthesia Physical ?Anesthesia Plan ? ?ASA: 2 ? ?Anesthesia Plan: MAC  ? ?Post-op Pain Management:   ? ?Induction: Intravenous ? ?PONV Risk Score and Plan: 2 and Ondansetron, Dexamethasone, Midazolam, Propofol infusion and Treatment may vary due to age or medical condition ? ?Airway Management Planned: Simple Face Mask, Natural Airway and Nasal Cannula ? ?Additional Equipment: None ? ?Intra-op Plan:  ? ?Post-operative Plan:  ? ?Informed Consent: I have reviewed the patients History and Physical, chart, labs and discussed the procedure including the risks, benefits and alternatives for the proposed anesthesia with the patient or authorized representative who has indicated his/her understanding and acceptance.  ? ? ? ?Dental advisory given ? ?Plan Discussed with: CRNA ? ?Anesthesia Plan Comments: (Lab Results ?     Component                Value               Date                 ?     WBC                      3.5 (L)             10/11/2021           ?     HGB                       10.1 (L)            10/11/2021           ?     HCT                      31.5 (L)            10/11/2021           ?     MCV                      91.0                10/11/2021           ?     PLT                      261  10/11/2021           ?Lab Results ?     Component                Value               Date                 ?     NA                       141                 10/11/2021           ?     K                        3.7                 10/11/2021           ?     CO2                      28                  10/11/2021           ?     GLUCOSE                  82                  10/11/2021           ?     BUN                      8                   10/11/2021           ?     CREATININE               0.66                10/11/2021           ?     CALCIUM                  9.3                 10/11/2021           ?     GFRNONAA                 >60                 10/11/2021          )  ? ? ? ? ? ? ?Anesthesia Quick Evaluation ? ?

## 2021-11-05 NOTE — Op Note (Signed)
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  She presents now for port removal. ? ?Description of procedure: The patient is brought to the operating room placed in the supine position on the operating room table.  She was given some intravenous sedation.  Her right chest was prepped with ChloraPrep and draped sterile fashion.  A timeout was taken to ensure the proper patient and proper procedure.  We infiltrated the area over the port with local anesthetic.  I opened her old incision.  We dissected down through the dermis and subcutaneous fat using cautery.  We we dissected around the port.  The 2 stay sutures were removed.  The port was removed.  Direct pressure was held over the insertion site on the jugular vein until we were satisfied with hemostasis.  I excised the capsule of the port site.  We inspected for hemostasis.  The wound was closed with 3-0 Vicryl 4-0 Monocryl.  Benzoin and Steri-Strips were applied.  The patient was awakened and brought to the recovery room in stable condition.  All sponge, instrument, and needle counts are correct. ? ?Imogene Burn. Jerren Flinchbaugh, MD, FACS ?[facility]  ?General Surgery ? ? ?11/05/2021 ?9:44 AM ? ?

## 2021-11-05 NOTE — Transfer of Care (Signed)
Immediate Anesthesia Transfer of Care Note ? ?Patient: Amber White ? ?Procedure(s) Performed: REMOVAL PORT-A-CATH (Right: Chest) ? ?Patient Location: PACU ? ?Anesthesia Type:MAC ? ?Level of Consciousness: sedated ? ?Airway & Oxygen Therapy: Patient Spontanous Breathing and Patient connected to face mask oxygen ? ?Post-op Assessment: Report given to RN and Post -op Vital signs reviewed and stable ? ?Post vital signs: Reviewed and stable ? ?Last Vitals:  ?Vitals Value Taken Time  ?BP 105/74 11/05/21 0947  ?Temp 36.6 ?C 11/05/21 0947  ?Pulse 68 11/05/21 0949  ?Resp 16 11/05/21 0950  ?SpO2 100 % 11/05/21 0949  ?Vitals shown include unvalidated device data. ? ?Last Pain:  ?Vitals:  ? 10/28/21 0957  ?TempSrc: Oral  ?PainSc: 0-No pain  ?   ? ?Patients Stated Pain Goal: 4 (10/28/21 0957) ? ?Complications: No notable events documented. ?

## 2021-11-05 NOTE — Discharge Instructions (Addendum)
? ? ?  PORT-A-CATH REMOVAL: POST OP INSTRUCTIONS ? ?Always review your discharge instruction sheet given to you by the facility where your surgery was performed.  ? ?You make take acetaminophen (Tylenol) or ibuprofen (Advil) as needed. An ice pack may help with any soreness ?Take your usually prescribed medications unless otherwise directed. ?You should follow a light diet for the remainder of the day after your procedure. ?Most patients will experience some mild swelling and/or bruising in the area of the incision. It may take several days to resolve. ?Unless discharge instructions indicate otherwise, you may remove your bandages 48 hours after surgery, and you may shower at that time. You may have steri-strips (small white skin tapes) in place directly over the incision.  These strips should be left on the skin for 7-10 days.   ?ACTIVITIES:  Limit activity involving your arms for the next 24 hours. Do no strenuous exercise or activity for 1 week. You may drive when you are no longer taking prescription pain medication, you can comfortably wear a seatbelt, and you can maneuver your car. ? ? ?WHEN TO CALL YOUR DOCTOR [phone]): ?Fever over 101.0 ?Chills ?Continued bleeding from incision ?Increased redness and tenderness at the site ?Shortness of breath, difficulty breathing ? ? ?The clinic staff is available to answer your questions during regular business hours. Please don?t hesitate to call and ask to speak to one of the nurses or medical assistants for clinical concerns. If you have a medical emergency, go to the nearest emergency room or call 911.  A surgeon from [facility] is always on call at the hospital.  ? ? ? ? ? ? ? ?Post Anesthesia Home Care Instructions ? ?Activity: ?Get plenty of rest for the remainder of the day. A responsible individual must stay with you for 24 hours following the procedure.  ?For the next 24 hours, DO NOT: ?-Drive a car ?-Paediatric nurse ?-Drink alcoholic  beverages ?-Take any medication unless instructed by your physician ?-Make any legal decisions or sign important papers. ? ?Meals: ?Start with liquid foods such as gelatin or soup. Progress to regular foods as tolerated. Avoid greasy, spicy, heavy foods. If nausea and/or vomiting occur, drink only clear liquids until the nausea and/or vomiting subsides. Call your physician if vomiting continues. ? ?Special Instructions/Symptoms: ?Your throat may feel dry or sore from the anesthesia or the breathing tube placed in your throat during surgery. If this causes discomfort, gargle with warm salt water. The discomfort should disappear within 24 hours. ? ?If you had a scopolamine patch placed behind your ear for the management of post- operative nausea and/or vomiting: ? ?1. The medication in the patch is effective for 72 hours, after which it should be removed.  Wrap patch in a tissue and discard in the trash. Wash hands thoroughly with soap and water. ?2. You may remove the patch earlier than 72 hours if you experience unpleasant side effects which may include dry mouth, dizziness or visual disturbances. ?3. Avoid touching the patch. Wash your hands with soap and water after contact with the patch. ?    ?

## 2021-11-05 NOTE — Anesthesia Postprocedure Evaluation (Signed)
Anesthesia Post Note ? ?Patient: Amber White ? ?Procedure(s) Performed: REMOVAL PORT-A-CATH (Right: Chest) ? ?  ? ?Patient location during evaluation: PACU ?Anesthesia Type: MAC ?Level of consciousness: awake and alert ?Pain management: pain level controlled ?Vital Signs Assessment: post-procedure vital signs reviewed and stable ?Respiratory status: spontaneous breathing, nonlabored ventilation, respiratory function stable and patient connected to nasal cannula oxygen ?Cardiovascular status: stable and blood pressure returned to baseline ?Postop Assessment: no apparent nausea or vomiting ?Anesthetic complications: no ? ? ?No notable events documented. ? ?Last Vitals:  ?Vitals:  ? 11/05/21 1007 11/05/21 1031  ?BP: 117/73 131/88  ?Pulse: 73 77  ?Resp: 19 15  ?Temp:  (!) 36.4 ?C  ?SpO2: 99% 98%  ?  ?Last Pain:  ?Vitals:  ? 11/05/21 1031  ?TempSrc:   ?PainSc: 0-No pain  ? ? ?  ?  ?  ?  ?  ?  ? ?[name] ? ? ? ? ?

## 2021-11-05 NOTE — H&P (Signed)
Amber White is an 46 y.o. female.   ?Chief Complaint: Breast cancer ?HPI: This is a 46 year old female with right breast cancer with axillary metastases.  She underwent neoadjuvant chemotherapy followed by bilateral mastectomies in 12/22.  She presents now for port removal. ? ?Past Medical History:  ?Diagnosis Date  ? Adrenal insufficiency ([place]) 05/2021  ? in setting of chemotherapy  ? breast ca dx'd 10/2020  ? right  ? Cardiomyopathy ([place]) 05/2021  ? 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 Cardiomyopathy ([place])   ? Dyspnea   ? Family history of breast cancer   ? History of radiation therapy   ? right breast  08/12/2021-09/20/2021  Dr Gery Pray  ? Hypertension   ? Peripheral neuropathy 04/2021  ? likely related to Taxol  ? ? ?Past Surgical History:  ?Procedure Laterality Date  ? CHOLECYSTECTOMY    ? MODIFIED MASTECTOMY Right 06/27/2021  ? Procedure: RIGHT MODIFIED RADICAL MASTECTOMY;  Surgeon: Donnie Mesa, MD;  Location: [place];  Service: General;  Laterality: Right;  ? PORTACATH PLACEMENT N/A 11/21/2020  ? Procedure: INSERTION PORT-A-CATH;  Surgeon: Donnie Mesa, MD;  Location: [place];  Service: General;  Laterality: N/A;  ? TOTAL MASTECTOMY Left 06/27/2021  ? Procedure: LEFT TOTAL MASTECTOMY;  Surgeon: Donnie Mesa, MD;  Location: [place];  Service: General;  Laterality: Left;  ? ? ?Family History  ?Problem Relation Age of Onset  ? Breast cancer Mother   ? Diabetes Maternal Uncle   ? Diabetes Maternal Grandmother   ? Heart disease Maternal Grandmother   ? ?Social History:  reports that she has never smoked. She has never used smokeless tobacco. She reports current alcohol use. She reports that she does not use drugs. ? ?Allergies:  ?Allergies  ?Allergen Reactions  ? Paclitaxel Nausea Only and Other (See Comments)  ?  Complained of chest burning and nausea. Resolved with famotidine, methylprednisolone, and diphenhydramine. Resumed paclitaxel and completed without further incident.   ? Penicillins  Hives  ? ? ?Medications Prior to Admission  ?Medication Sig Dispense Refill  ? ADDERALL XR 30 MG 24 hr capsule Take 30 mg by mouth every morning.    ? bisoprolol (ZEBETA) 10 MG tablet TAKE 1 TABLET BY MOUTH EVERY DAY 30 tablet 3  ? hydrocortisone (CORTEF) 5 MG tablet Take 2 tablets (10 mg total) by mouth in the morning AND 1 tablet (5 mg total) daily before supper. 90 tablet 3  ? fluconazole (DIFLUCAN) 150 MG tablet Take 1 tablet (150 mg total) by mouth daily. Take second tablet 72 hours later 2 tablet 0  ? ? ?No results found for this or any previous visit (from the past 48 hour(s)). ?No results found. ? ?Review of Systems  ?HENT:  Negative for ear discharge, ear pain, hearing loss and tinnitus.   ?Eyes:  Negative for photophobia and pain.  ?Respiratory:  Negative for cough and shortness of breath.   ?Cardiovascular:  Negative for chest pain.  ?Gastrointestinal:  Negative for abdominal pain, nausea and vomiting.  ?Genitourinary:  Negative for dysuria, flank pain, frequency and urgency.  ?Musculoskeletal:  Negative for back pain, myalgias and neck pain.  ?Skin:  Positive for wound.  ?Neurological:  Negative for dizziness and headaches.  ?Hematological:  Does not bruise/bleed easily.  ?Psychiatric/Behavioral:  The patient is not nervous/anxious.   ? ?Height '5\' 4"'$  (1.626 m), weight 115.2 kg. ?Physical Exam  ?WDWN in NAD ?Bilateral mastectomy sites ?Right axilla - healing superficial wound ?Right port site - c/d/i ?  Assessment/Plan ?Breast cancer s/p chemotherapy ? ?Port removal. ? ?The surgical procedure has been discussed with the patient.  Potential risks, benefits, alternative treatments, and expected outcomes have been explained.  All of the patient's questions at this time have been answered.  The likelihood of reaching the patient's treatment goal is good.  The patient understand the proposed surgical procedure and wishes to proceed. ? ? ?Maia Petties, MD ?11/05/2021, 7:50 AM ? ? ? ?

## 2021-11-06 ENCOUNTER — Encounter (HOSPITAL_BASED_OUTPATIENT_CLINIC_OR_DEPARTMENT_OTHER): Payer: Self-pay | Admitting: Surgery

## 2021-11-06 NOTE — Progress Notes (Signed)
No answer

## 2021-11-14 ENCOUNTER — Other Ambulatory Visit: Payer: Self-pay | Admitting: Hematology and Oncology

## 2021-11-14 DIAGNOSIS — C50411 Malignant neoplasm of upper-outer quadrant of right female breast: Secondary | ICD-10-CM

## 2021-11-15 ENCOUNTER — Encounter: Payer: Self-pay | Admitting: Hematology and Oncology

## 2021-11-18 ENCOUNTER — Other Ambulatory Visit: Payer: Self-pay | Admitting: Hematology and Oncology

## 2021-11-18 DIAGNOSIS — C50411 Malignant neoplasm of upper-outer quadrant of right female breast: Secondary | ICD-10-CM

## 2021-12-14 ENCOUNTER — Emergency Department (HOSPITAL_COMMUNITY)
Admission: EM | Admit: 2021-12-14 | Discharge: 2021-12-14 | Discharge status: Against Medical Advice | Payer: No Typology Code available for payment source | Attending: Emergency Medicine | Admitting: Emergency Medicine

## 2021-12-14 ENCOUNTER — Encounter (HOSPITAL_COMMUNITY): Payer: Self-pay

## 2021-12-14 DIAGNOSIS — R531 Weakness: Secondary | ICD-10-CM | POA: Insufficient documentation

## 2021-12-14 DIAGNOSIS — Z5321 Procedure and treatment not carried out due to patient leaving prior to being seen by health care provider: Secondary | ICD-10-CM | POA: Diagnosis not present

## 2021-12-14 LAB — COMPREHENSIVE METABOLIC PANEL
ALT: 11 U/L (ref 0–44)
AST: 17 U/L (ref 15–41)
Albumin: 3.8 g/dL (ref 3.5–5.0)
Alkaline Phosphatase: 42 U/L (ref 38–126)
Anion gap: 7 (ref 5–15)
BUN: 10 mg/dL (ref 6–20)
CO2: 25 mmol/L (ref 22–32)
Calcium: 9.5 mg/dL (ref 8.9–10.3)
Chloride: 109 mmol/L (ref 98–111)
Creatinine, Ser: 0.61 mg/dL (ref 0.44–1.00)
GFR, Estimated: >60 mL/min (ref >60)
Glucose, Bld: 101 mg/dL — ABNORMAL HIGH (ref 70–99)
Potassium: 3.3 mmol/L — ABNORMAL LOW (ref 3.5–5.1)
Sodium: 141 mmol/L (ref 135–145)
Total Bilirubin: 0.5 mg/dL (ref 0.3–1.2)
Total Protein: 8.1 g/dL (ref 6.5–8.1)

## 2021-12-14 LAB — CBC WITH DIFFERENTIAL/PLATELET
Abs Immature Granulocytes: 0.01 10*3/uL (ref 0.00–0.07)
Basophils Absolute: 0 10*3/uL (ref 0.0–0.1)
Basophils Relative: 0 %
Eosinophils Absolute: 0.1 10*3/uL (ref 0.0–0.5)
Eosinophils Relative: 2 %
HCT: 34 % — ABNORMAL LOW (ref 36.0–46.0)
Hemoglobin: 10.7 g/dL — ABNORMAL LOW (ref 12.0–15.0)
Immature Granulocytes: 0 %
Lymphocytes Relative: 48 %
Lymphs Abs: 2.2 10*3/uL (ref 0.7–4.0)
MCH: 29.4 pg (ref 26.0–34.0)
MCHC: 31.5 g/dL (ref 30.0–36.0)
MCV: 93.4 fL (ref 80.0–100.0)
Monocytes Absolute: 0.3 10*3/uL (ref 0.1–1.0)
Monocytes Relative: 7 %
Neutro Abs: 2 10*3/uL (ref 1.7–7.7)
Neutrophils Relative %: 43 %
Platelets: 306 10*3/uL (ref 150–400)
RBC: 3.64 MIL/uL — ABNORMAL LOW (ref 3.87–5.11)
RDW: 13.8 % (ref 11.5–15.5)
WBC: 4.6 10*3/uL (ref 4.0–10.5)
nRBC: 0 % (ref 0.0–0.2)

## 2021-12-14 LAB — TROPONIN I (HIGH SENSITIVITY): Troponin I (High Sensitivity): 3 ng/L (ref <18)

## 2021-12-14 LAB — URINALYSIS, ROUTINE W REFLEX MICROSCOPIC
Bilirubin Urine: NEGATIVE
Glucose, UA: NEGATIVE mg/dL
Hgb urine dipstick: NEGATIVE
Ketones, ur: NEGATIVE mg/dL
Nitrite: NEGATIVE
Protein, ur: NEGATIVE mg/dL
Specific Gravity, Urine: 1.02 (ref 1.005–1.030)
pH: 5 (ref 5.0–8.0)

## 2021-12-14 LAB — CBG MONITORING, ED: Glucose-Capillary: 86 mg/dL (ref 70–99)

## 2021-12-14 LAB — I-STAT BETA HCG BLOOD, ED (MC, WL, AP ONLY): I-stat hCG, quantitative: <5 m[IU]/mL (ref <5)

## 2021-12-14 LAB — LIPASE, BLOOD: Lipase: 30 U/L (ref 11–51)

## 2021-12-14 MED ORDER — ONDANSETRON HCL 4 MG/2ML IJ SOLN: 4.0000 mg | Freq: Once | INTRAMUSCULAR | Status: DC

## 2021-12-14 NOTE — ED Provider Triage Note (Signed)
Emergency Medicine Provider Triage Evaluation Note  Amber White , a 46 y.o. female  was evaluated in triage.  Pt complains of weakness, abdominal pain, nausea, vomiting.  She states that her symptoms began when she woke up this morning.  She states that her pain is generalized throughout her abdomen.  She states that soon after she woke up this morning she had 1 episode of nausea and emesis.  She thought maybe she was feeling this way because she had not eaten anything and she subsequently went to [place] and had a small meal but was unable to keep it down.  She states that she has not had any specific fevers but has felt very warm and sweaty today.  Denies any diarrhea, states that her last bowel movement was yesterday evening and was normal.  Review of Systems  Positive:  Negative: See above  Physical Exam  BP 132/90 (BP Location: Right Arm)   Pulse 78   Temp 97.9 F (36.6 C) (Oral)   Resp 18   Ht '5\' 4"'$  (1.626 m)   Wt 115.2 kg   SpO2 100%   BMI 43.60 kg/m  Gen:   Awake, no distress   Resp:  Normal effort  MSK:   Moves extremities without difficulty  Other:    Medical Decision Making  Medically screening exam initiated at 6:21 PM.  Appropriate orders placed.  Eliyana Pagliaro was informed that the remainder of the evaluation will be completed by another provider, this initial triage assessment does not replace that evaluation, and the importance of remaining in the ED until their evaluation is complete.     Bud Face, PA-C 12/14/21 1843

## 2021-12-14 NOTE — ED Triage Notes (Signed)
Pt presents with c/o weakness that started today. Pt reports she left her house and began feeling weak. Pt reports she did stop to get something to eat but that did not help her weakness.

## 2021-12-31 ENCOUNTER — Telehealth: Payer: Self-pay | Admitting: Adult Health

## 2021-12-31 NOTE — Telephone Encounter (Signed)
Rescheduled appointmetn per provider PAL. Called patient and the phone just rang. Eventually a message was given stating, "a call can not be completed at this time." Patient will be mailed an updated calendar.

## 2022-01-01 ENCOUNTER — Other Ambulatory Visit: Payer: Self-pay | Admitting: Hematology and Oncology

## 2022-01-02 ENCOUNTER — Encounter: Payer: Self-pay | Admitting: Hematology and Oncology

## 2022-01-13 ENCOUNTER — Other Ambulatory Visit: Payer: No Typology Code available for payment source

## 2022-01-13 ENCOUNTER — Ambulatory Visit: Payer: No Typology Code available for payment source | Admitting: Hematology and Oncology

## 2022-01-13 ENCOUNTER — Encounter: Payer: No Typology Code available for payment source | Admitting: Adult Health

## 2022-01-20 ENCOUNTER — Encounter: Payer: No Typology Code available for payment source | Admitting: Adult Health

## 2022-01-20 ENCOUNTER — Ambulatory Visit: Payer: No Typology Code available for payment source | Admitting: Hematology and Oncology

## 2022-01-20 ENCOUNTER — Other Ambulatory Visit: Payer: No Typology Code available for payment source

## 2022-01-29 ENCOUNTER — Ambulatory Visit: Payer: No Typology Code available for payment source | Admitting: Plastic Surgery

## 2022-02-03 ENCOUNTER — Encounter: Payer: Self-pay | Admitting: *Deleted

## 2022-02-04 ENCOUNTER — Inpatient Hospital Stay: Payer: No Typology Code available for payment source | Attending: Adult Health

## 2022-02-04 ENCOUNTER — Encounter: Payer: Self-pay | Admitting: Adult Health

## 2022-02-04 ENCOUNTER — Encounter: Payer: Self-pay | Admitting: Gastroenterology

## 2022-02-04 ENCOUNTER — Other Ambulatory Visit: Payer: Self-pay

## 2022-02-04 ENCOUNTER — Telehealth: Payer: Self-pay | Admitting: Surgery

## 2022-02-04 ENCOUNTER — Encounter: Payer: Self-pay | Admitting: Surgery

## 2022-02-04 ENCOUNTER — Inpatient Hospital Stay (HOSPITAL_BASED_OUTPATIENT_CLINIC_OR_DEPARTMENT_OTHER): Payer: No Typology Code available for payment source | Admitting: Adult Health

## 2022-02-04 ENCOUNTER — Other Ambulatory Visit: Payer: Self-pay | Admitting: *Deleted

## 2022-02-04 VITALS — BP 115/82 | HR 87 | Temp 97.7°F | Resp 18 | Ht 64.0 in | Wt 233.4 lb

## 2022-02-04 DIAGNOSIS — Z171 Estrogen receptor negative status [ER-]: Secondary | ICD-10-CM | POA: Diagnosis not present

## 2022-02-04 DIAGNOSIS — R6881 Early satiety: Secondary | ICD-10-CM | POA: Diagnosis not present

## 2022-02-04 DIAGNOSIS — T451X5A Adverse effect of antineoplastic and immunosuppressive drugs, initial encounter: Secondary | ICD-10-CM | POA: Diagnosis not present

## 2022-02-04 DIAGNOSIS — G620 Drug-induced polyneuropathy: Secondary | ICD-10-CM | POA: Diagnosis not present

## 2022-02-04 DIAGNOSIS — C50411 Malignant neoplasm of upper-outer quadrant of right female breast: Secondary | ICD-10-CM | POA: Insufficient documentation

## 2022-02-04 DIAGNOSIS — Z803 Family history of malignant neoplasm of breast: Secondary | ICD-10-CM

## 2022-02-04 LAB — CBC WITH DIFFERENTIAL (CANCER CENTER ONLY)
Abs Immature Granulocytes: 0.01 10*3/uL (ref 0.00–0.07)
Basophils Absolute: 0 10*3/uL (ref 0.0–0.1)
Basophils Relative: 1 %
Eosinophils Absolute: 0.1 10*3/uL (ref 0.0–0.5)
Eosinophils Relative: 3 %
HCT: 30.9 % — ABNORMAL LOW (ref 36.0–46.0)
Hemoglobin: 10.4 g/dL — ABNORMAL LOW (ref 12.0–15.0)
Immature Granulocytes: 0 %
Lymphocytes Relative: 53 %
Lymphs Abs: 2.1 10*3/uL (ref 0.7–4.0)
MCH: 30.1 pg (ref 26.0–34.0)
MCHC: 33.7 g/dL (ref 30.0–36.0)
MCV: 89.6 fL (ref 80.0–100.0)
Monocytes Absolute: 0.4 10*3/uL (ref 0.1–1.0)
Monocytes Relative: 9 %
Neutro Abs: 1.4 10*3/uL — ABNORMAL LOW (ref 1.7–7.7)
Neutrophils Relative %: 34 %
Platelet Count: 265 10*3/uL (ref 150–400)
RBC: 3.45 MIL/uL — ABNORMAL LOW (ref 3.87–5.11)
RDW: 13.2 % (ref 11.5–15.5)
WBC Count: 4 10*3/uL (ref 4.0–10.5)
nRBC: 0 % (ref 0.0–0.2)

## 2022-02-04 NOTE — Progress Notes (Signed)
Per Wilber Bihari, NP, I called [facility] to schedule the pt for an appointment due to early satiety.  The appointment was scheduled for Friday, August 18 at 10:30.  The pt was still in the office speaking to [place], so I gave her a note with the details of the appointment and the pt verified understanding.

## 2022-02-04 NOTE — Progress Notes (Signed)
SURVIVORSHIP VISIT:   BRIEF ONCOLOGIC HISTORY:  Oncology History  Malignant neoplasm of upper-outer quadrant of right breast in female, estrogen receptor negative ([place])  10/31/2020 Initial Diagnosis   Patient palpated a right breast mass x50mo Diagnostic mammogram and UKoreashowed an abnormality and calcifications in the right breast, 8.2cm, with skin thickening and right axillary adenopathy. 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   11/07/2020 Cancer Staging   Staging form: Breast, AJCC 8th Edition - Clinical: Stage IIIC (cT4, cN1, cM0, G3, ER-, PR-, HER2: Equivocal) - Signed by GNicholas Lose MD on 11/07/2020 Histologic grading system: 3 grade system   11/14/2020 Imaging   CT CAP 11/14/2020: Enlarged right axillary lymph nodes, prominent left axillary lymph node.  (Requires an ultrasound) no evidence of metastatic disease in the abdomen or pelvis Bone scan 11/16/2020: No evidence of bone metastases   11/22/2020 -  Chemotherapy   Patient is on Treatment Plan: BREAST PEMBROLIZUMAB + AC Q21D X 4 CYCLES FOLLOWED BY PEMBROLIZUMAB + CARBOPLATIN D1 + PACLITAXEL D1,8,15 Q21D X 4 CYCLES        Genetic Testing   Negative genetic testing. No pathogenic variants identified on the [facility]Expanded[facility]RNA panel. The report date is 11/28/2020.  The CancerNext-Expanded + RNAinsight gene panel offered by [employer] includes sequencing and rearrangement analysis for the following 77 genes: IP, ALK, APC*, ATM*, AXIN2, BAP1, BARD1, BLM, BMPR1A, BRCA1*, BRCA2*, BRIP1*, CDC73, CDH1*,CDK4, CDKN1B, CDKN2A, CHEK2*, CTNNA1, DICER1, FANCC, FH, FLCN, GALNT12, KIF1B, LZTR1, MAX, MEN1, MET, MLH1*, MSH2*, MSH3, MSH6*, MUTYH*, NBN, NF1*, NF2, NTHL1, PALB2*, PHOX2B, PMS2*, POT1, PRKAR1A, PTCH1, PTEN*, RAD51C*, RAD51D*,RB1, RECQL, RET, SDHA, SDHAF2, SDHB, SDHC, SDHD, SMAD4, SMARCA4, SMARCB1, SMARCE1, STK11, SUFU, TMEM127,  TP53*,TSC1, TSC2, VHL and XRCC2 (sequencing and deletion/duplication); EGFR, EGLN1, HOXB13, KIT, MITF, PDGFRA, POLD1 and POLE (sequencing only); EPCAM and GREM1 (deletion/duplication only).   06/27/2021 Surgery   06/27/2021: Bilateral mastectomies: Left mastectomy: Benign Right mastectomy: Grade 3 IDC with high-grade DCIS 4.8 cm, margins negative, 8/21 lymph nodes positive   08/12/2021 - 09/20/2021 Radiation Therapy   08/12/2021 through 09/20/2021          INTERVAL HISTORY:  Ms. JDohnto review her survivorship care plan detailing her treatment course for breast cancer, as well as monitoring long-term side effects of that treatment, education regarding health maintenance, screening, and overall wellness and health promotion.     Overall, Ms. JMccaskillreports feeling quite well   Her biggest concern is peripheral neuropathy in her fingertips and toes.  Sometimes she has difficulty with holding and grabbing things.  She has some difficulty with eating and her appetite.    REVIEW OF SYSTEMS:  Review of Systems  Constitutional:  Positive for appetite change. Negative for chills, fatigue, fever and unexpected weight change.  HENT:   Negative for hearing loss, lump/mass and trouble swallowing.   Eyes:  Negative for eye problems and icterus.  Respiratory:  Negative for chest tightness, cough and shortness of breath.   Cardiovascular:  Negative for chest pain, leg swelling and palpitations.  Gastrointestinal:  Negative for abdominal distention, abdominal pain, constipation, diarrhea, nausea and vomiting.  Endocrine: Negative for hot flashes.  Genitourinary:  Negative for difficulty urinating.   Musculoskeletal:  Negative for arthralgias.  Skin:  Negative for itching and rash.  Neurological:  Positive for numbness. Negative for dizziness, extremity weakness and headaches.  Hematological:  Negative for adenopathy. Does not bruise/bleed easily.  Psychiatric/Behavioral:  Negative for depression. The  patient is not nervous/anxious.   Breast: Denies any new nodularity, masses, tenderness, nipple changes, or nipple discharge.      ONCOLOGY TREATMENT TEAM:  1. Surgeon:  Dr. Georgette Dover at [facility] 2. Medical Oncologist: Dr. Lindi Adie  3. Radiation Oncologist: Dr. 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   Port-A-Cath in place 11/22/2020   Past Surgical History:  Procedure Laterality Date   CHOLECYSTECTOMY     MODIFIED MASTECTOMY Right 06/27/2021   Procedure: RIGHT MODIFIED RADICAL MASTECTOMY;  Surgeon: Donnie Mesa, MD;  Location: [place];  Service: General;  Laterality: Right;   PORT-A-CATH REMOVAL Right 11/05/2021   Procedure: REMOVAL PORT-A-CATH;  Surgeon: Donnie Mesa, MD;  Location: [place];  Service: General;  Laterality: Right;   PORTACATH PLACEMENT N/A 11/21/2020   Procedure: INSERTION PORT-A-CATH;  Surgeon: Donnie Mesa, MD;  Location: [place];  Service: General;  Laterality: N/A;   TOTAL MASTECTOMY Left 06/27/2021   Procedure: LEFT TOTAL MASTECTOMY;  Surgeon: Donnie Mesa, MD;  Location: [place];  Service: General;  Laterality: Left;     ALLERGIES:  Allergies  Allergen Reactions   Paclitaxel Nausea Only and Other (See Comments)    Complained of chest burning and nausea. Resolved with famotidine, methylprednisolone, and diphenhydramine. Resumed paclitaxel and completed without further incident.    Penicillins Hives     CURRENT MEDICATIONS:  Outpatient Encounter Medications as of  02/04/2022  Medication Sig   ADDERALL XR 30 MG 24 hr capsule Take 30 mg by mouth every morning.   bisoprolol (ZEBETA) 10 MG tablet TAKE 1 TABLET BY MOUTH EVERY DAY   hydrocortisone (CORTEF) 5 MG tablet Take 2 tablets (10 mg total) by mouth in the morning AND 1 tablet (5 mg total) daily before supper. (Patient taking differently: Take 1 tablets (5 mg total) by mouth in the morning and 1/2 tablet (2.5 mg total) daily before bedtime.)   [DISCONTINUED] prochlorperazine (COMPAZINE) 10 MG tablet Take 1 tablet (10 mg total) by mouth every 6 (six) hours as needed (Nausea or vomiting).   Facility-Administered Encounter Medications as of 02/04/2022  Medication   filgrastim-aafi (NIVESTYM) 480 MCG/0.8ML injection     ONCOLOGIC FAMILY HISTORY:  Family History  Problem Relation Age of Onset   Breast cancer Mother    Diabetes Maternal Uncle    Diabetes Maternal Grandmother    Heart disease Maternal Grandmother       SOCIAL HISTORY:  Social History   Socioeconomic History   Marital status: Single    Spouse name: Not on file   Number of children: 4   Years of education: Not on file   Highest education level: Not on file  Occupational History   Not on file  Tobacco Use   Smoking status: Never   Smokeless tobacco: Never  Vaping Use   Vaping Use: Never used  Substance and Sexual Activity   Alcohol use: Yes    Comment: rare  Drug use: No   Sexual activity: Yes    Birth control/protection: None  Other Topics Concern   Not on file  Social History Narrative   Not on file   Social Determinants of Health   Financial Resource Strain: Not on file  Food Insecurity: Not on file  Transportation Needs: Not on file  Physical Activity: Not on file  Stress: Not on file  Social Connections: Not on file  Intimate Partner Violence: Not on file     OBSERVATIONS/OBJECTIVE:  BP 115/82 (BP Location: Left Arm, Patient Position: Sitting)   Pulse 87   Temp 97.7 F (36.5 C) (Tympanic)   Resp 18    Ht _0  (1.626 m)   Wt 233 lb 6.4 oz (105.9 kg)   SpO2 100%   BMI 40.06 kg/m  GENERAL: Patient is a well appearing female in no acute distress HEENT:  Sclerae anicteric.  Oropharynx clear and moist. No ulcerations or evidence of oropharyngeal candidiasis. Neck is supple.  NODES:  No cervical, supraclavicular, or axillary lymphadenopathy palpated.  BREAST EXAM:  s/p bilateral mastectomies, right mastectomy site s/p radiation, no sign of local recurrence, left chest wall s/p mastectomy, benign LUNGS:  Clear to auscultation bilaterally.  No wheezes or rhonchi. HEART:  Regular rate and rhythm. No murmur appreciated. ABDOMEN:  Soft, nontender.  Positive, normoactive bowel sounds. No organomegaly palpated. MSK:  No focal spinal tenderness to palpation. Full range of motion bilaterally in the upper extremities. EXTREMITIES:  No peripheral edema.   SKIN:  Clear with no obvious rashes or skin changes. No nail dyscrasia. NEURO:  Nonfocal. Well oriented.  Appropriate affect.   LABORATORY DATA:  None for this visit.  DIAGNOSTIC IMAGING:  None for this visit.      ASSESSMENT AND PLAN:  Ms.. Magadan is a pleasant 46 y.o. 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  She presents to the Survivorship Clinic for our initial meeting and routine follow-up post-completion of treatment for breast cancer.    1. Stage IIIC right breast cancer:  Ms. Sciarra is continuing to recover from definitive treatment for breast cancer. She will follow-up with her medical oncologist, Dr. Lindi Adie in 3 months with history and physical exam per surveillance protocol. Today, a comprehensive survivorship care plan and treatment summary was reviewed with the patient today detailing her breast cancer diagnosis, treatment course, potential late/long-term effects  of treatment, appropriate follow-up care with recommendations for the future, and patient education resources.  A copy of this summary, along with a letter will be sent to the patient's primary care provider via mail/fax/In Basket message after today's visit.    2. Appetite changes: She is due for colonoscopy, so I referred her to GI for further evaluation of both.  She may need an upper endoscopy as well.    3.  Peripheral Neuropathy: This is improving.  However considering her lymph node removal and neuropathy I placed a referral to PT to help her with both.   4. Bone health:  She was given education on specific activities to promote bone health.  5. Cancer screening:  Due to Ms. Fasnacht's history and her age, she should receive screening for skin cancers, colon cancer, and gynecologic cancers.  The information and recommendations are listed on the patient's comprehensive care plan/treatment summary and were reviewed in detail with the patient.    6. Health maintenance and wellness promotion: Ms.  Blouch was encouraged to consume 5-7 servings of fruits and vegetables per day. We reviewed the "Nutrition Rainbow" handout.  She was also encouraged to engage in moderate to vigorous exercise for 30 minutes per day most days of the week. We discussed the [employer] fitness program, which is designed for cancer survivors to help them become more physically fit after cancer treatments.  She was instructed to limit her alcohol consumption and continue to abstain from tobacco use.     7. Support services/counseling: It is not uncommon for this period of the patient's cancer care trajectory to be one of many emotions and stressors. She was given information regarding our available services and encouraged to contact me with any questions or for help enrolling in any of our support group/programs.    Follow up instructions:    -Return to cancer center in  3 months for f/u with Dr. Lindi Adie -Follow up with  surgery in 6 months -Referral to Dr. Iran Planas -She is welcome to return back to the Survivorship Clinic at any time; no additional follow-up needed at this time.  -Consider referral back to survivorship as a long-term survivor for continued surveillance  The patient was provided an opportunity to ask questions and all were answered. The patient agreed with the plan and demonstrated an understanding of the instructions.   Total encounter time:45 minutes*in face-to-face visit time, chart review, lab review, care coordination, order entry, and documentation of the encounter time.   Wilber Bihari, NP 02/04/22 10:30 AM Medical Oncology and Hematology [facility] [place], [place] [address] Tel. [phone]    Fax. [phone]  *Total Encounter Time as defined by the Centers for Medicare and Medicaid Services includes, in addition to the face-to-face time of a patient visit (documented in the note above) non-face-to-face time: obtaining and reviewing outside history, ordering and reviewing medications, tests or procedures, care coordination (communications with other health care professionals or caregivers) and documentation in the medical record.

## 2022-02-04 NOTE — Telephone Encounter (Signed)
Per Wilber Bihari, NP, I called the pt to discuss the referral to the plastic surgeon Dr. Iran Planas.    When I called the office of Dr. Iran Planas, I was told that she is in surgery Tuesdays and Fridays, and that the wait time for a non urgent request would be approximately a month.  I called the pt to see if she was willing to wait a month, and she stated that did not want to wait that long and will keep her appointment this Friday with Dr. Marla Roe.  Wilber Bihari notified of the pt's decision.

## 2022-02-05 ENCOUNTER — Telehealth: Payer: Self-pay | Admitting: Adult Health

## 2022-02-05 NOTE — Telephone Encounter (Signed)
Scheduled appointment per 7/25 los. Patient is aware.

## 2022-02-07 ENCOUNTER — Ambulatory Visit (INDEPENDENT_AMBULATORY_CARE_PROVIDER_SITE_OTHER): Payer: No Typology Code available for payment source | Admitting: Plastic Surgery

## 2022-02-07 ENCOUNTER — Ambulatory Visit: Payer: No Typology Code available for payment source | Admitting: Plastic Surgery

## 2022-02-07 VITALS — BP 112/78 | HR 85 | Wt 231.4 lb

## 2022-02-07 DIAGNOSIS — Z923 Personal history of irradiation: Secondary | ICD-10-CM

## 2022-02-07 DIAGNOSIS — Z171 Estrogen receptor negative status [ER-]: Secondary | ICD-10-CM

## 2022-02-07 DIAGNOSIS — C50411 Malignant neoplasm of upper-outer quadrant of right female breast: Secondary | ICD-10-CM | POA: Diagnosis not present

## 2022-02-07 NOTE — Progress Notes (Signed)
   Referring Provider [name], MD [address] [place],  [place] [address]   CC:  Breast reconstruction.  Amber White is an 46 y.o. female.  HPI: 46 year old with a history of right breast cancer and radiation on the right side.  She will be 6 months postradiation at the end of August.  She is interested in reconstruction but hesitant about any flap reconstruction.  Review of Systems General: no fever or chills.  Physical Exam    02/07/2022   11:08 AM 02/04/2022    9:44 AM 12/14/2021    5:11 PM  Vitals with BMI  Height  '5\' 4"'$  '5\' 4"'$   Weight 231 lbs 6 oz 233 lbs 6 oz 254 lbs  BMI 39.7 56.15 37.94  Systolic 327 614 709  Diastolic 78 82 90  Pulse 85 87 78    General:  No acute distress,  Alert and oriented, Non-Toxic, Normal speech and affect Breast: Bilateral well-healed mastectomy sites.  She has some hyperpigmentation on her radiated side on the right but does have some redundant skin on both sides.  Assessment/Plan Discussed options with the patient including referral for Diep flaps for bilateral reconstruction, tissue expander reconstruction, or tissue expander reconstruction with latissimus flap on the right.  Since she does have some redundant skin and has recovered well from her radiation we discussed that I would offer her bilateral tissue expander reconstruction with the understanding that she would have a high risk of complications on the right side which was radiated.  She is interested in proceeding and is aware that she may ultimately need a flap on the right side if her right-sided reconstruction fails.  Lennice Sites 02/07/2022, 1:53 PM

## 2022-02-25 NOTE — Therapy (Signed)
OUTPATIENT PHYSICAL THERAPY ONCOLOGY EVALUATION  Patient Name: Ayani Ospina MRN: [id number] DOB:Mar 04, 1976, 46 y.o., female Today's Date: 02/26/2022   PT End of Session - 02/26/22 0910     Visit Number 1    Number of Visits 12    Date for PT Re-Evaluation 04/09/22    PT Start Time 0910   pt late   PT Stop Time 0950    PT Time Calculation (min) 40 min    Activity Tolerance Patient tolerated treatment well    Behavior During Therapy [facility] for tasks assessed/performed             Past Medical History:  Diagnosis Date   Adrenal insufficiency ([place]) 05/2021   in setting of chemotherapy   breast ca dx'd 10/2020   right   Cardiomyopathy ([place]) 05/2021   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   Cardiomyopathy ([place])    Dyspnea    Family history of breast cancer    History of radiation therapy    right breast  08/12/2021-09/20/2021  Dr Gery Pray   Hypertension    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   Port-A-Cath in place 11/22/2020   Past Surgical History:  Procedure Laterality Date   CHOLECYSTECTOMY     MODIFIED MASTECTOMY Right 06/27/2021   Procedure: RIGHT MODIFIED RADICAL MASTECTOMY;  Surgeon: Donnie Mesa, MD;  Location: [place];  Service: General;  Laterality: Right;   PORT-A-CATH REMOVAL Right 11/05/2021   Procedure: REMOVAL PORT-A-CATH;  Surgeon: Donnie Mesa, MD;  Location: [facility];  Service: General;  Laterality: Right;   PORTACATH PLACEMENT N/A 11/21/2020   Procedure: INSERTION PORT-A-CATH;  Surgeon: Donnie Mesa, MD;  Location: [place];  Service: General;  Laterality: N/A;   TOTAL MASTECTOMY Left 06/27/2021   Procedure: LEFT TOTAL MASTECTOMY;  Surgeon: Donnie Mesa, MD;  Location: [facility];  Service: General;  Laterality: Left;   Patient Active Problem List   Diagnosis Date Noted   Chemotherapy-induced peripheral neuropathy ([place]) 02/04/2022   Cardiomyopathy ([place])    Dyspnea 05/22/2021   Dyspnea on minimal exertion  05/21/2021   Hypokalemia 05/21/2021   Hyponatremia 05/21/2021   Anemia associated with chemotherapy 05/21/2021   Mild protein malnutrition ([place]) 05/21/2021   Elevated TSH 05/21/2021   Genetic testing 11/28/2020   Family history of breast cancer    Malignant neoplasm of upper-outer quadrant of right breast in female, estrogen receptor negative ([place]) 11/06/2020   Pregnancy 02/19/2014   Morbid obesity ([place]) 02/19/2014   Dermoid cyst of left ovary 02/19/2014    PCP: Dr. Jannette Fogo  REFERRING PROVIDER: Wilber Bihari NP  REFERRING DIAG: Right Breast Cancer,Neuropathy hands and feet, discuss lymphedema risk  THERAPY DIAG:  Malignant neoplasm of upper-outer quadrant of right breast in female, estrogen receptor negative ([place])  Abnormal posture  Malignant neoplasm of upper-outer quadrant of right breast in female, estrogen receptor positive ([facility])  Stiffness of right shoulder, not elsewhere classified  Muscle weakness (generalized)  Stiffness of left shoulder, not elsewhere classified  ONSET DATE: 10/31/20  Rationale for Evaluation and Treatment Rehabilitation  SUBJECTIVE  SUBJECTIVE STATEMENT: I am in terrible shape. I have trouble opening water bottles. Shoulder ROM is not doing well. Can't put hair in [place]. Hard to carry groceries and get up stairs.  My Right arm hurts and it is hard to sleep at night.The neuropathy is improving and doesn't bother me much at all. Pt  reports intermittent swelling in her arm that comes and goes.   PERTINENT HISTORY:  Pt was diagnosed with Right breast cancer. She underwent bilateral mastectomies on 06/27/2021 with 8+/21 LN's on the right. . She had neo adjuvant chemotherapy and developed neuropathy in her hands and feet which still bothers her. She also had radiation  on the right side. She is considering tissue expander reconstruction. She was seen in the ED on 10/12/2021 for swelling of the arm.   PAIN:  Are you having pain? Yes NPRS scale: 4/10 Pain location: right shoulder Pain orientation: Right  PAIN TYPE: aching Pain description: intermittent  Aggravating factors: reaching, lifting, carryig Relieving factors: OTC meds  PRECAUTIONS: Other: lymphedema risk right UE  WEIGHT BEARING RESTRICTIONS No  FALLS:  Has patient fallen in last 6 months? No  LIVING ENVIRONMENT: Lives with: lives with their family, 3 children 56, 11, 8 Lives in: House/apartment Stairs: Yes; Internal: 15 steps; on right going up Has following equipment at home: None  OCCUPATION: sitting office job  LEISURE: none  HAND DOMINANCE : right   PRIOR LEVEL OF FUNCTION: Independent  PATIENT GOALS Get full use of right arm again   OBJECTIVE  COGNITION:  Overall cognitive status: Within functional limits for tasks assessed   PALPATION: NA  OBSERVATIONS / OTHER ASSESSMENTS: significant forward head, rounded shoulders posture. Visibly increased swelling in right upper arm.  Darkened  right neck and chest from radiation.   SENSATION:  Light touch: Appears intactmildly numb still  POSTURE: forward head, rounded shoulders  UPPER EXTREMITY AROM/PROM:  A/PROM RIGHT   eval   Shoulder extension 37, tight  Shoulder flexion 57 weak  Shoulder abduction 40 weak  Shoulder internal rotation   Shoulder external rotation     (Blank rows = not tested)  A/PROM LEFT   eval  Shoulder extension 46  Shoulder flexion 105  Shoulder abduction 116  Shoulder internal rotation 52  Shoulder external rotation 79    (Blank rows = not tested)   CERVICAL AROM: All within functional limits limits:     UPPER EXTREMITY STRENGTH: Right 2+/5 throughout, left 3-/5  LYMPHEDEMA ASSESSMENTS:   SURGERY TYPE/DATE:Bilateral mastectomies with Right SLNB, 06/27/2021  NUMBER OF LYMPH  NODES REMOVED: 8/21  CHEMOTHERAPY: yes, neoadjuvant  RADIATION:yes, right  HORMONE TREATMENT: no  INFECTIONS: no  LYMPHEDEMA ASSESSMENTS:   LANDMARK RIGHT  eval  10 cm proximal to olecranon process 45.4  Olecranon process 33.2  10 cm proximal to ulnar styloid process 24.8  Just proximal to ulnar styloid process 20.1  Across hand at thumb web space 20.7  At base of 2nd digit 7.3  (Blank rows = not tested)  LANDMARK LEFT  eval  10 cm proximal to olecranon process From ant. Elbow37.5  Olecranon process 29.3  10 cm proximal to ulnar styloid process 24.0  Just proximal to ulnar styloid process 21.2  Across hand at thumb web space 19.9  At base of 2nd digit 7.3  (Blank rows = not tested)       QUICK DASH SURVEY: 43%   TODAY'S TREATMENT  Educated pt in POC for lymphedema treatment and shoulder ROM/strengthening. Instructed in 4  post op exercises to do  flexion and stargazer in supine due to weakeness  PATIENT EDUCATION:  Education details: 4 post op exercises Person educated: Patient Education method: Consulting civil engineer, Demonstration, and Handouts Education comprehension: verbalized understanding and returned demonstration   HOME EXERCISE PROGRAM: 4 post op exercises with flexion and stargaazer in supine  ASSESSMENT:  CLINICAL IMPRESSION: Patient is a 46 y.o. female who was seen today for physical therapy evaluation and treatment s/p Bilateral mastectomies on 06/27/2021  with 8/21 LN's and with radiation and with concerns about UE weakness and swelling. Neuropathy is greatly improved and not really a concern.  She has poor ROM especially on right due to weakness and tightness, but did well with post op exercises. She has increased edema in the right UE with 8 cm difference at 10 cm prox to olecranon and will benefit from CDT including MLD, bandaging, exercises and skin care education  in addition to shoulder ROM and strengthening to improve function.   OBJECTIVE  IMPAIRMENTS decreased activity tolerance, decreased ROM, decreased strength, increased edema, impaired UE functional use, and pain.   ACTIVITY LIMITATIONS carrying, lifting, sleeping, and dressing  PARTICIPATION LIMITATIONS: cleaning, driving, and occupation  PERSONAL FACTORS 1-2 comorbidities: Right breast mastectomy with right radiation  are also affecting patient's functional outcome.   REHAB POTENTIAL: Good  CLINICAL DECISION MAKING: Stable/uncomplicated  EVALUATION COMPLEXITY: Low  GOALS: Goals reviewed with patient? Yes  SHORT TERM GOALS: Target date: 03/19/2022    Pt will be independent and compliant with HEP for shoulder ROM and strengthening Baseline: Goal status: INITIAL  2.  Pt will have right shoulder AROM flexion 90 degrees for improved function Baseline: 57 Goal status: INITIAL  3.  Family member will be educated in compression bandaging and will be compliant in performing to decrease right UE lymphedema Baseline:  Goal status: INITIAL  4.  Pt will have 2-3 cm reduction at 10 cm prox to olecranon measured at front of elbow Baseline:  Goal status: INITIAL  5.     LONG TERM GOALS: Target date: 04/09/2022    Pt will have AROM of right shoulder to atleast 110 degrees for improved dressing, bathing and reching Baseline: 57 Goal status: INITIAL  2.  Pt will have decreased edema at 10 cm prox to olecranon by 4 cm Baseline: 8 cm Goal status: INITIAL  3.  Pt will be fit for compression garments and will be independent in their use Baseline:  Goal status: INITIAL  4.  Pt will attend ABC class for extensive education of lymphedema Baseline:  Goal status: INITIAL  5.  Pts quick dash will be no greater than 15% to demonstrate improved function Baseline: 43% Goal status: INITIAL   PLAN: PT FREQUENCY: 2x/week  PT DURATION: 6 weeks  PLANNED INTERVENTIONS: Therapeutic exercises, Therapeutic activity, Neuromuscular re-education, Patient/Family education,  Self Care, Joint mobilization, Orthotic/Fit training, Manual lymph drainage, Compression bandaging, scar mobilization, Vasopneumatic device, Manual therapy, and Re-evaluation, ABC class  PLAN FOR NEXT SESSION: Coming 1x this week;focus on shoulder ROM and strength. Review post op; add supine ABC's, punches etc. Next week start with MLD and compression bandaging. Pt to bring family member for education Remind about ABC class on 03/03/2022   Claris Pong, PT 02/26/2022, 12:07 PM

## 2022-02-26 ENCOUNTER — Ambulatory Visit: Payer: No Typology Code available for payment source | Attending: Adult Health

## 2022-02-26 ENCOUNTER — Other Ambulatory Visit: Payer: Self-pay

## 2022-02-26 DIAGNOSIS — R293 Abnormal posture: Secondary | ICD-10-CM | POA: Insufficient documentation

## 2022-02-26 DIAGNOSIS — R209 Unspecified disturbances of skin sensation: Secondary | ICD-10-CM | POA: Insufficient documentation

## 2022-02-26 DIAGNOSIS — M25612 Stiffness of left shoulder, not elsewhere classified: Secondary | ICD-10-CM | POA: Diagnosis present

## 2022-02-26 DIAGNOSIS — Z170 Estrogen receptor positive status [ER+]: Secondary | ICD-10-CM | POA: Insufficient documentation

## 2022-02-26 DIAGNOSIS — M25611 Stiffness of right shoulder, not elsewhere classified: Secondary | ICD-10-CM | POA: Insufficient documentation

## 2022-02-26 DIAGNOSIS — Z171 Estrogen receptor negative status [ER-]: Secondary | ICD-10-CM | POA: Insufficient documentation

## 2022-02-26 DIAGNOSIS — M6281 Muscle weakness (generalized): Secondary | ICD-10-CM | POA: Diagnosis present

## 2022-02-26 DIAGNOSIS — C50411 Malignant neoplasm of upper-outer quadrant of right female breast: Secondary | ICD-10-CM | POA: Diagnosis present

## 2022-02-27 ENCOUNTER — Telehealth: Payer: Self-pay | Admitting: *Deleted

## 2022-02-27 ENCOUNTER — Encounter: Payer: Self-pay | Admitting: Adult Health

## 2022-02-27 NOTE — Telephone Encounter (Signed)
Referral faxed to [place] office @ [phone]. Fax receipt confirmed

## 2022-02-28 ENCOUNTER — Ambulatory Visit: Payer: No Typology Code available for payment source | Admitting: Gastroenterology

## 2022-02-28 ENCOUNTER — Ambulatory Visit: Payer: No Typology Code available for payment source

## 2022-02-28 DIAGNOSIS — M6281 Muscle weakness (generalized): Secondary | ICD-10-CM

## 2022-02-28 DIAGNOSIS — C50411 Malignant neoplasm of upper-outer quadrant of right female breast: Secondary | ICD-10-CM | POA: Diagnosis not present

## 2022-02-28 DIAGNOSIS — Z171 Estrogen receptor negative status [ER-]: Secondary | ICD-10-CM

## 2022-02-28 DIAGNOSIS — M25611 Stiffness of right shoulder, not elsewhere classified: Secondary | ICD-10-CM

## 2022-02-28 DIAGNOSIS — M25612 Stiffness of left shoulder, not elsewhere classified: Secondary | ICD-10-CM

## 2022-02-28 DIAGNOSIS — Z170 Estrogen receptor positive status [ER+]: Secondary | ICD-10-CM

## 2022-02-28 DIAGNOSIS — R293 Abnormal posture: Secondary | ICD-10-CM

## 2022-02-28 NOTE — Therapy (Signed)
OUTPATIENT PHYSICAL THERAPY ONCOLOGY TREATMENT  Patient Name: Amber White MRN: [id number] DOB:11/24/1975, 46 y.o., female Today's Date: 02/28/2022   PT End of Session - 02/28/22 0806     Visit Number 2    Number of Visits 12    Date for PT Re-Evaluation 04/09/22    PT Start Time 0807    PT Stop Time 0858    PT Time Calculation (min) 51 min    Activity Tolerance Patient tolerated treatment well    Behavior During Therapy [facility] for tasks assessed/performed             Past Medical History:  Diagnosis Date   Adrenal insufficiency ([place]) 05/2021   in setting of chemotherapy   breast ca dx'd 10/2020   right   Cardiomyopathy ([place]) 05/2021   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   Cardiomyopathy ([place])    Dyspnea    Family history of breast cancer    History of radiation therapy    right breast  08/12/2021-09/20/2021  Dr Gery Pray   Hypertension    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   Port-A-Cath in place 11/22/2020   Past Surgical History:  Procedure Laterality Date   CHOLECYSTECTOMY     MODIFIED MASTECTOMY Right 06/27/2021   Procedure: RIGHT MODIFIED RADICAL MASTECTOMY;  Surgeon: Donnie Mesa, MD;  Location: [facility];  Service: General;  Laterality: Right;   PORT-A-CATH REMOVAL Right 11/05/2021   Procedure: REMOVAL PORT-A-CATH;  Surgeon: Donnie Mesa, MD;  Location: [place];  Service: General;  Laterality: Right;   PORTACATH PLACEMENT N/A 11/21/2020   Procedure: INSERTION PORT-A-CATH;  Surgeon: Donnie Mesa, MD;  Location: [facility];  Service: General;  Laterality: N/A;   TOTAL MASTECTOMY Left 06/27/2021   Procedure: LEFT TOTAL MASTECTOMY;  Surgeon: Donnie Mesa, MD;  Location: [facility];  Service: General;  Laterality: Left;   Patient Active Problem List   Diagnosis Date Noted   Chemotherapy-induced peripheral neuropathy ([place]) 02/04/2022   Cardiomyopathy ([place])    Dyspnea 05/22/2021   Dyspnea on minimal exertion 05/21/2021    Hypokalemia 05/21/2021   Hyponatremia 05/21/2021   Anemia associated with chemotherapy 05/21/2021   Mild protein malnutrition ([place]) 05/21/2021   Elevated TSH 05/21/2021   Genetic testing 11/28/2020   Family history of breast cancer    Malignant neoplasm of upper-outer quadrant of right breast in female, estrogen receptor negative ([place]) 11/06/2020   Pregnancy 02/19/2014   Morbid obesity ([place]) 02/19/2014   Dermoid cyst of left ovary 02/19/2014    PCP: Dr. Jannette Fogo  REFERRING PROVIDER: Wilber Bihari NP  REFERRING DIAG: Right Breast Cancer,Neuropathy hands and feet, discuss lymphedema risk  THERAPY DIAG:  Malignant neoplasm of upper-outer quadrant of right breast in female, estrogen receptor negative ([place])  Abnormal posture  Malignant neoplasm of upper-outer quadrant of right breast in female, estrogen receptor positive ([facility])  Stiffness of right shoulder, not elsewhere classified  Muscle weakness (generalized)  Stiffness of left shoulder, not elsewhere classified  ONSET DATE: 10/31/20  Rationale for Evaluation and Treatment Rehabilitation  SUBJECTIVE  SUBJECTIVE STATEMENT:  I have been doing the exercises but only 1x per day.   PERTINENT HISTORY:  Pt was diagnosed with Right breast cancer. She underwent bilateral mastectomies on 06/27/2021 with 8+/21 LN's on the right. . She had neo adjuvant chemotherapy and developed neuropathy in her hands and feet which still bothers her. She also had radiation on the right side. She is considering tissue expander reconstruction. She was seen in the ED on 10/12/2021 for swelling of the arm.   PAIN:  Are you having pain? Yes woke up with pain in my left wrist, but I don't know why. NPRS scale: 8/10 Pain location: left wrist Pain orientation: left PAIN  TYPE: aching Pain description: constant Aggravating factors: driving Relieving factors: OTC meds  PRECAUTIONS: Other: lymphedema risk right UE  WEIGHT BEARING RESTRICTIONS No  FALLS:  Has patient fallen in last 6 months? No  LIVING ENVIRONMENT: Lives with: lives with their family, 3 children 71, 41, 74 Lives in: House/apartment Stairs: Yes; Internal: 15 steps; on right going up Has following equipment at home: None  OCCUPATION: sitting office job  LEISURE: none  HAND DOMINANCE : right   PRIOR LEVEL OF FUNCTION: Independent  PATIENT GOALS Get full use of right arm again   OBJECTIVE  COGNITION:  Overall cognitive status: Within functional limits for tasks assessed   PALPATION: NA  OBSERVATIONS / OTHER ASSESSMENTS: significant forward head, rounded shoulders posture. Visibly increased swelling in right upper arm.  Darkened  right neck and chest from radiation.   SENSATION:  Light touch: Appears intactmildly numb still  POSTURE: forward head, rounded shoulders  UPPER EXTREMITY AROM/PROM:  A/PROM RIGHT   eval   Shoulder extension 37, tight  Shoulder flexion 57 weak  Shoulder abduction 40 weak  Shoulder internal rotation   Shoulder external rotation     (Blank rows = not tested)  A/PROM LEFT   eval  Shoulder extension 46  Shoulder flexion 105  Shoulder abduction 116  Shoulder internal rotation 52  Shoulder external rotation 79    (Blank rows = not tested)   CERVICAL AROM: All within functional limits limits:     UPPER EXTREMITY STRENGTH: Right 2+/5 throughout, left 3-/5  LYMPHEDEMA ASSESSMENTS:   SURGERY TYPE/DATE:Bilateral mastectomies with Right SLNB, 06/27/2021  NUMBER OF LYMPH NODES REMOVED: 8/21  CHEMOTHERAPY: yes, neoadjuvant  RADIATION:yes, right  HORMONE TREATMENT: no  INFECTIONS: no  LYMPHEDEMA ASSESSMENTS:   LANDMARK RIGHT  eval  10 cm proximal to olecranon process 45.4  Olecranon process 33.2  10 cm proximal to ulnar  styloid process 24.8  Just proximal to ulnar styloid process 20.1  Across hand at thumb web space 20.7  At base of 2nd digit 7.3  (Blank rows = not tested)  LANDMARK LEFT  eval  10 cm proximal to olecranon process From ant. Elbow37.5  Olecranon process 29.3  10 cm proximal to ulnar styloid process 24.0  Just proximal to ulnar styloid process 21.2  Across hand at thumb web space 19.9  At base of 2nd digit 7.3  (Blank rows = not tested)       QUICK DASH SURVEY: 43%   TODAY'S TREATMENT   02/28/2022 Reviewed 4 post op exercises x5 B Right chest press AROM supine 2x5 with occasional assist, serratus punch x 10, shoulder circles x 10 B,  AROM long arm flexion 2 x 5 all repeated on left and in supine Clasped hand shoulder flexion x 5, stargazer x 5 PROM bilateral shoulder flexion, abduction, IR and  ER   02/26/2022 Educated pt in [place] for lymphedema treatment and shoulder ROM/strengthening. Instructed in 4 post op exercises to do  flexion and stargazer in supine due to weakeness  PATIENT EDUCATION:  Education details: 4 post op exercisesAccess Code: [email or web address] URL: [email or web address] Date: 02/28/2022 Prepared by: Cheral Almas  Exercises - Supine Shoulder Press  - 1 x daily - 7 x weekly - 2 sets - 5 reps - Supine Single Arm Shoulder Protraction  - 1 x daily - 7 x weekly - 2 sets - 5 reps - Supine Shoulder Circles  - 1 x daily - 7 x weekly - 2 sets - 5 reps - Supine Alternating Shoulder Flexion  - 1 x daily - 7 x weekly - 1 sets - 5 reps Person educated: Patient Education method: Explanation, Demonstration, and Handouts Education comprehension: verbalized understanding and returned demonstration   HOME EXERCISE PROGRAM: 4 post op exercises with flexion and stargaazer in supine  ASSESSMENT:  CLINICAL IMPRESSION: Pt did well with new exercises instructed with VC's and TC's for correct performance initially. Fatigued but had no increased pain. Encouraged to do  no exercises at least 1x per day, and stretches 2x's per day. Will start bandaging next week.   OBJECTIVE IMPAIRMENTS decreased activity tolerance, decreased ROM, decreased strength, increased edema, impaired UE functional use, and pain.   ACTIVITY LIMITATIONS carrying, lifting, sleeping, and dressing  PARTICIPATION LIMITATIONS: cleaning, driving, and occupation  PERSONAL FACTORS 1-2 comorbidities: Right breast mastectomy with right radiation  are also affecting patient's functional outcome.   REHAB POTENTIAL: Good  CLINICAL DECISION MAKING: Stable/uncomplicated  EVALUATION COMPLEXITY: Low  GOALS: Goals reviewed with patient? Yes  SHORT TERM GOALS: Target date: 03/21/2022    Pt will be independent and compliant with HEP for shoulder ROM and strengthening Baseline: Goal status: INITIAL  2.  Pt will have right shoulder AROM flexion 90 degrees for improved function Baseline: 57 Goal status: INITIAL  3.  Family member will be educated in compression bandaging and will be compliant in performing to decrease right UE lymphedema Baseline:  Goal status: INITIAL  4.  Pt will have 2-3 cm reduction at 10 cm prox to olecranon measured at front of elbow Baseline:  Goal status: INITIAL  5.     LONG TERM GOALS: Target date: 04/11/2022    Pt will have AROM of right shoulder to atleast 110 degrees for improved dressing, bathing and reching Baseline: 57 Goal status: INITIAL  2.  Pt will have decreased edema at 10 cm prox to olecranon by 4 cm Baseline: 8 cm Goal status: INITIAL  3.  Pt will be fit for compression garments and will be independent in their use Baseline:  Goal status: INITIAL  4.  Pt will attend ABC class for extensive education of lymphedema Baseline:  Goal status: INITIAL  5.  Pts quick dash will be no greater than 15% to demonstrate improved function Baseline: 43% Goal status: INITIAL   PLAN: PT FREQUENCY: 2x/week  PT DURATION: 6 weeks  PLANNED  INTERVENTIONS: Therapeutic exercises, Therapeutic activity, Neuromuscular re-education, Patient/Family education, Self Care, Joint mobilization, Orthotic/Fit training, Manual lymph drainage, Compression bandaging, scar mobilization, Vasopneumatic device, Manual therapy, and Re-evaluation, ABC class  PLAN FOR NEXT SESSION: . Next week start with MLD and compression bandaging to right UE. Pt to bring family member for education Remind about ABC class on 03/03/2022, continue shoulder ROM/strength   Claris Pong, PT 02/28/2022, 9:01 AM

## 2022-03-03 ENCOUNTER — Telehealth: Payer: Self-pay | Admitting: *Deleted

## 2022-03-03 NOTE — Telephone Encounter (Signed)
Patient sent MyChart message requesting to cancel her surgery with no desire to reschedule at this time. Case cancelled as well as pre/post op appointments.

## 2022-03-11 ENCOUNTER — Encounter: Payer: Self-pay | Admitting: Adult Health

## 2022-03-11 NOTE — Telephone Encounter (Signed)
Hi Mickel Baas, No it isn't ready yet we got it on the 18th but apparently no birthdate was listed for her. Can you give me a birthday?

## 2022-03-13 ENCOUNTER — Encounter: Payer: No Typology Code available for payment source | Admitting: Surgical

## 2022-03-14 ENCOUNTER — Telehealth: Payer: Self-pay

## 2022-03-14 NOTE — Telephone Encounter (Signed)
Notified Patient of completion of FMLA Form. Paperwork sent to Patient via e-mail ([email or web address]) as requested. No other needs or concerns voiced at this time.

## 2022-03-19 ENCOUNTER — Ambulatory Visit
Admission: EM | Admit: 2022-03-19 | Discharge: 2022-03-19 | Disposition: A | Discharge status: Home / Self Care | Payer: No Typology Code available for payment source | Attending: Physician Assistant | Admitting: Physician Assistant

## 2022-03-19 DIAGNOSIS — Z1152 Encounter for screening for COVID-19: Secondary | ICD-10-CM | POA: Diagnosis not present

## 2022-03-19 DIAGNOSIS — J029 Acute pharyngitis, unspecified: Secondary | ICD-10-CM

## 2022-03-19 LAB — POCT RAPID STREP A (OFFICE): Rapid Strep A Screen: NEGATIVE

## 2022-03-19 NOTE — Discharge Instructions (Signed)
Strep screen negative. Culture ordered.   Check MyChart for results of covid screening and culture- we will call with positive results.   Continue symptomatic treatment while awaiting results.   Follow up with any further concerns.

## 2022-03-19 NOTE — ED Triage Notes (Signed)
Pt c/o sore throat and leg aches since yesterday

## 2022-03-19 NOTE — ED Provider Notes (Signed)
[facility]    CSN: [id number] Arrival date & time: 03/19/22  1538      History   Chief Complaint Chief Complaint  Patient presents with   sore throat and leg aches    HPI Amber White is a 46 y.o. female.   Patient here today for evaluation of sore throat and body aches that started yesterday.  She reports that her legs are achy and she has had chills but denies any fever.  She has not had any nausea, vomiting or diarrhea.  She denies any significant congestion or ear pain.  She has not had cough.  She has tried over-the-counter medication (Mucinex for sore throat) without significant relief.  The history is provided by the patient.    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   Cardiomyopathy ([place])    Dyspnea 05/22/2021   Dyspnea on minimal exertion 05/21/2021   Hypokalemia 05/21/2021   Hyponatremia 05/21/2021   Anemia associated with chemotherapy 05/21/2021   Mild protein malnutrition ([place]) 05/21/2021   Elevated TSH 05/21/2021   Genetic testing 11/28/2020   Family history of breast cancer    Malignant neoplasm of upper-outer quadrant of right breast in female, estrogen receptor negative ([place]) 11/06/2020   Pregnancy 02/19/2014   Morbid obesity ([place]) 02/19/2014   Dermoid cyst of left ovary 02/19/2014    Past Surgical History:  Procedure Laterality Date   CHOLECYSTECTOMY     MODIFIED MASTECTOMY Right 06/27/2021    Procedure: RIGHT MODIFIED RADICAL MASTECTOMY;  Surgeon: Donnie Mesa, MD;  Location: [facility];  Service: General;  Laterality: Right;   PORT-A-CATH REMOVAL Right 11/05/2021   Procedure: REMOVAL PORT-A-CATH;  Surgeon: Donnie Mesa, MD;  Location: [facility];  Service: General;  Laterality: Right;   PORTACATH PLACEMENT N/A 11/21/2020   Procedure: INSERTION PORT-A-CATH;  Surgeon: Donnie Mesa, MD;  Location: [facility];  Service: General;  Laterality: N/A;   TOTAL MASTECTOMY Left 06/27/2021   Procedure: LEFT TOTAL MASTECTOMY;  Surgeon: Donnie Mesa, MD;  Location: [facility];  Service: General;  Laterality: Left;    OB History     Gravida  6   Para  4   Term      Preterm      AB  1   Living  4      SAB      IAB  1   Ectopic      Multiple      Live Births               Home Medications    Prior to Admission medications   Medication Sig Start Date End Date Taking? Authorizing Provider  amphetamine-dextroamphetamine (ADDERALL) 30 MG tablet Take 30 mg by mouth daily as needed (focus). 03/06/22   [provider]  bisoprolol (ZEBETA) 10 MG tablet TAKE 1 TABLET BY MOUTH EVERY DAY 01/02/22  Nicholas Lose, MD  hydrocortisone (CORTEF) 5 MG tablet Take 2 tablets (10 mg total) by mouth in the morning AND 1 tablet (5 mg total) daily before supper. Patient taking differently: Take  5 mg by mouth in the morning and 2.5 mg daily before bedtime. 10/14/21   Nicholas Lose, MD  Multiple Vitamin (MULTIVITAMIN WITH MINERALS) TABS tablet Take 1 tablet by mouth daily.    [provider]  prochlorperazine (COMPAZINE) 10 MG tablet Take 1 tablet (10 mg total) by mouth every 6 (six) hours as needed (Nausea or vomiting). 11/07/20 04/25/21  Nicholas Lose, MD    Family History Family History  Problem Relation Age of Onset   Breast cancer Mother    Diabetes Maternal Uncle    Diabetes Maternal Grandmother    Heart disease Maternal Grandmother      Social History Social History   Tobacco Use   Smoking status: Never   Smokeless tobacco: Never  Vaping Use   Vaping Use: Never used  Substance Use Topics   Alcohol use: Yes    Comment: rare   Drug use: No     Allergies   Paclitaxel and Penicillins   Review of Systems Review of Systems  Constitutional:  Positive for chills. Negative for fever.  HENT:  Positive for sore throat. Negative for congestion and ear pain.   Eyes:  Negative for discharge and redness.  Respiratory:  Negative for cough, shortness of breath and wheezing.   Gastrointestinal:  Negative for abdominal pain, diarrhea, nausea and vomiting.     Physical Exam Triage Vital Signs ED Triage Vitals [03/19/22 1639]  Enc Vitals Group     BP 94/65     Pulse Rate 84     Resp 16     Temp 98.8 F (37.1 C)     Temp Source Oral     SpO2 97 %     Weight      Height      Head Circumference      Peak Flow      Pain Score 7     Pain Loc      Pain Edu?      Excl. in [place]?    No data found.  Updated Vital Signs BP 94/65 (BP Location: Left Arm)   Pulse 84   Temp 98.8 F (37.1 C) (Oral)   Resp 16   SpO2 97%      Physical Exam Vitals and nursing note reviewed.  Constitutional:      General: She is not in acute distress.    Appearance: Normal appearance. She is not ill-appearing.  HENT:     Head: Normocephalic and atraumatic.     Nose: Nose normal. No congestion or rhinorrhea.     Mouth/Throat:     Mouth: Mucous membranes are moist.     Pharynx: Posterior oropharyngeal erythema present. No oropharyngeal exudate.  Eyes:     Conjunctiva/sclera: Conjunctivae normal.  Cardiovascular:     Rate and Rhythm: Normal rate and regular rhythm.     Heart sounds: Normal heart sounds. No murmur heard. Pulmonary:     Effort: Pulmonary effort is normal. No respiratory distress.     Breath sounds: Normal breath sounds. No wheezing, rhonchi or rales.  Skin:    General: Skin is warm and dry.  Neurological:      Mental Status: She is alert.  Psychiatric:        Mood and Affect: Mood normal.  Thought Content: Thought content normal.      UC Treatments / Results  Labs (all labs ordered are listed, but only abnormal results are displayed) Labs Reviewed  CULTURE, GROUP A STREP ([place])  SARS CORONAVIRUS 2 (TAT 6-24 HRS)  POCT RAPID STREP A (OFFICE)    EKG   Radiology No results found.  Procedures Procedures (including critical care time)  Medications Ordered in UC Medications - No data to display  Initial Impression / Assessment and Plan / UC Course  I have reviewed the triage vital signs and the nursing notes.  Pertinent labs & imaging results that were available during my care of the patient were reviewed by me and considered in my medical decision making (see chart for details).    Strep test negative in office. Will order throat culture and covid screening. Encouraged symptomatic treatment, follow up if symptoms do not improve or worsen.   Final Clinical Impressions(s) / UC Diagnoses   Final diagnoses:  Acute pharyngitis, unspecified etiology  Encounter for screening for COVID-19     Discharge Instructions      Strep screen negative. Culture ordered.   Check MyChart for results of covid screening and culture- we will call with positive results.   Continue symptomatic treatment while awaiting results.   Follow up with any further concerns.      ED Prescriptions   None    PDMP not reviewed this encounter.   Francene Finders, PA-C 03/19/22 1758

## 2022-03-20 LAB — SARS CORONAVIRUS 2 (TAT 6-24 HRS): SARS Coronavirus 2: NEGATIVE

## 2022-03-20 NOTE — H&P (Signed)
Subjective:     Patient ID: Amber White is a 46 y.o. female.   HPI   Returns for follow up discussion breast reconstruction. Presented with palpable right breast mass. Imaging work up demonstrated 8.2 cm mass, with skin thickening and right axillary adenopathy. 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 Staging scans 11/2020 negative for distant disease. Underwent neoadjuvant chemotherapy, course complicated by neuropathy and adrenal insufficiency. 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   Completed adjuvant RT 3.10.23   Genetics negative. Mother with breast ca. She underwent lumpectomy as treatment.   Prior to mastectomies C cup. Right MRM 768 g left mastectomy 591 g Wt down 65 lb since time of cancer diagnosis.   Lives with 3 daughters ages 42, 20, 15. Has fiance to assist with post op care. 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   Review of Systems Remainder 12 point review negative    Objective:   Physical Exam Cardiovascular:     Rate and Rhythm: Normal rate and regular rhythm.     Heart sounds: Normal heart sounds.  Pulmonary:     Effort: Pulmonary effort is normal.     Breath sounds: Normal breath sounds.  Lymphadenopathy:     Upper Body:     Right upper body: No axillary adenopathy.     Left upper body: No axillary adenopathy.  Skin:    Comments: Fitzpatrick 6     Chest: bilateral breast absent transverse chest scars bilateral redundant soft tissue extending to lateral chest wall  laxity of right chest soft tissue possible seroma present Right chest hyperpigmentation CW 15 cm      Assessment:     Right breast ca Neoadjuvant chemotherapy S/p right MRM left mastectomy Adjuvant radiation    Plan:     Remains anemic with Hb 10.4 01/2022, this has been stable over several months. Will recheck pre op.    Plan bilateral breast reconstruction with tissue expanders, acellular dermis to left chest, right latissimus dorsi flap to right chest   Reviewed the increased risk of complications associated with radiation and reconstruction. Autologous tissue as part of reconstruction would reduce this risk. In this delayed setting reviewed microsurgical options including abdomen, buttock or thigh donor site. Benefit of these would be to address radiation risks and possible absolve need for implant and its associated risks. I also counseled she may ultimatley achieve more symmetry with purely autologous reconstruction in setting RT over implant based reconstruction. Patient declines referral to microsurgeon.   Reviewed  LD + TE on right. Reviewed incisions, drains, OR length, hospital stay and recovery. Reviewed function of this muscle and possible long term weakness or motion changes. Reviewed process of expansion and implant based risks including rupture, imaging surveillance for silicone implants, infection requiring surgery or removal, contracture. Reviewed implants are not permanent devices and may require additional surgery over lifetime.  Counseled in setting RT, expander placement alone over right chest, high risk soft tissue will not expand reliably and increased risks exposure/extrusion.    Over left plan TE and ADM. Discussed off label use of acellular dermis in reconstruction, cadaveric source, incorporation over several weeks, risk that if has seroma or infection can act as additional nidus for infection if not incorporated.    Reviewed reconstruction will be asensate and not stimulate. Reviewed additional risks including but not limited  to risks seroma, hematoma, asymmetry, need to additional procedures, fat necrosis, DVT/PE, damage to adjacent structures, cardiopulmonary complications, unacceptable cosmetic result. She has redundant tissue mastectomy flaps bilateral and will likely need excision of this,  may require this over stages.   Drain log provided. Rx for oxycodone robaxin and Bactrim given

## 2022-03-21 NOTE — Pre-Procedure Instructions (Signed)
Surgical Instructions    Your procedure is scheduled on 04/01/22.  Report to [facility] Main Entrance "A" at 0530 A.M., then check in with the Admitting office.  Call this number if you have problems the morning of surgery:  [phone]   If you have any questions prior to your surgery date call [phone]: Open Monday-Friday 8am-4pm    Remember:  Do not eat after midnight the night before your surgery  You may drink clear liquids until 04:30 the morning of your surgery.   Clear liquids allowed are: Water, Non-Citrus Juices (without pulp), Carbonated Beverages, Clear Tea, Black Coffee ONLY (NO MILK, CREAM OR POWDERED CREAMER of any kind), and Gatorade    Take these medicines the morning of surgery with A SIP OF WATER:  bisoprolol (ZEBETA)  As of today, STOP taking any Aspirin (unless otherwise instructed by your surgeon) Aleve, Naproxen, Ibuprofen, Motrin, Advil, Goody's, BC's, all herbal medications, fish oil, and all vitamins.           Do not wear jewelry or makeup. Do not wear lotions, powders, perfumes/cologne or deodorant. Do not shave 48 hours prior to surgery.  Men may shave face and neck. Do not bring valuables to the hospital. Do not wear nail polish, gel polish, artificial nails, or any other type of covering on natural nails (fingers and toes) If you have artificial nails or gel coating that need to be removed by a nail salon, please have this removed prior to surgery. Artificial nails or gel coating may interfere with anesthesia's ability to adequately monitor your vital signs.  [facility] is not responsible for any belongings or valuables.    Do NOT Smoke (Tobacco/Vaping)  24 hours prior to your procedure  If you use a CPAP at night, you may bring your mask for your overnight stay.   Contacts, glasses, hearing aids, dentures or partials may not be worn into surgery, please bring cases for these belongings   For patients admitted to the hospital, discharge time  will be determined by your treatment team.   Patients discharged the day of surgery will not be allowed to drive home, and someone needs to stay with them for 24 hours.   SURGICAL WAITING ROOM VISITATION Patients having surgery or a procedure may have no more than 2 support people in the waiting area - these visitors may rotate.   Children under the age of 39 must have an adult with them who is not the patient. If the patient needs to stay at the hospital during part of their recovery, the visitor guidelines for inpatient rooms apply. Pre-op nurse will coordinate an appropriate time for 1 support person to accompany patient in pre-op.  This support person may not rotate.   Please refer to the [facility] website for the visitor guidelines for Inpatients (after your surgery is over and you are in a regular room).    Special instructions:    Oral Hygiene is also important to reduce your risk of infection.  Remember - BRUSH YOUR TEETH THE MORNING OF SURGERY WITH YOUR REGULAR TOOTHPASTE   [facility]- Preparing For Surgery  Before surgery, you can play an important role. Because skin is not sterile, your skin needs to be as free of germs as possible. You can reduce the number of germs on your skin by washing with CHG (chlorahexidine gluconate) Soap before surgery.  CHG is an antiseptic cleaner which kills germs and bonds with the skin to continue killing germs even after washing.  Please do not use if you have an allergy to CHG or antibacterial soaps. If your skin becomes reddened/irritated stop using the CHG.  Do not shave (including legs and underarms) for at least 48 hours prior to first CHG shower. It is OK to shave your face.  Please follow these instructions carefully.     Shower the NIGHT BEFORE SURGERY and the MORNING OF SURGERY with CHG Soap.   If you chose to wash your hair, wash your hair first as usual with your normal shampoo. After you shampoo, rinse your hair and body  thoroughly to remove the shampoo.  Then [employer] and genitals (private parts) with your normal soap and rinse thoroughly to remove soap.  After that Use CHG Soap as you would any other liquid soap. You can apply CHG directly to the skin and wash gently with a scrungie or a clean washcloth.   Apply the CHG Soap to your body ONLY FROM THE NECK DOWN.  Do not use on open wounds or open sores. Avoid contact with your eyes, ears, mouth and genitals (private parts). Wash Face and genitals (private parts)  with your normal soap.   Wash thoroughly, paying special attention to the area where your surgery will be performed.  Thoroughly rinse your body with warm water from the neck down.  DO NOT shower/wash with your normal soap after using and rinsing off the CHG Soap.  Pat yourself dry with a CLEAN TOWEL.  Wear CLEAN PAJAMAS to bed the night before surgery  Place CLEAN SHEETS on your bed the night before your surgery  DO NOT SLEEP WITH PETS.   Day of Surgery:  Take a shower with CHG soap. Wear Clean/Comfortable clothing the morning of surgery Do not apply any deodorants/lotions.   Remember to brush your teeth WITH YOUR REGULAR TOOTHPASTE.    If you received a COVID test during your pre-op visit, it is requested that you wear a mask when out in public, stay away from anyone that may not be feeling well, and notify your surgeon if you develop symptoms. If you have been in contact with anyone that has tested positive in the last 10 days, please notify your surgeon.    Please read over the following fact sheets that you were given.

## 2022-03-22 LAB — CULTURE, GROUP A STREP (THRC)

## 2022-03-24 ENCOUNTER — Encounter (HOSPITAL_COMMUNITY): Payer: Self-pay

## 2022-03-24 ENCOUNTER — Other Ambulatory Visit: Payer: Self-pay

## 2022-03-24 ENCOUNTER — Encounter (HOSPITAL_COMMUNITY)
Admission: RE | Admit: 2022-03-24 | Discharge: 2022-03-24 | Disposition: A | Discharge status: Home / Self Care | Payer: No Typology Code available for payment source | Source: Ambulatory Visit | Attending: Plastic Surgery | Admitting: Plastic Surgery

## 2022-03-24 VITALS — BP 112/77 | HR 73 | Temp 98.4°F | Resp 17 | Ht 64.0 in | Wt 220.9 lb

## 2022-03-24 DIAGNOSIS — I429 Cardiomyopathy, unspecified: Secondary | ICD-10-CM

## 2022-03-24 DIAGNOSIS — D6481 Anemia due to antineoplastic chemotherapy: Secondary | ICD-10-CM | POA: Insufficient documentation

## 2022-03-24 DIAGNOSIS — Z01812 Encounter for preprocedural laboratory examination: Secondary | ICD-10-CM | POA: Diagnosis present

## 2022-03-24 HISTORY — DX: Anemia, unspecified: D64.9

## 2022-03-24 LAB — CBC
HCT: 30.1 % — ABNORMAL LOW (ref 36.0–46.0)
Hemoglobin: 9.8 g/dL — ABNORMAL LOW (ref 12.0–15.0)
MCH: 29.2 pg (ref 26.0–34.0)
MCHC: 32.6 g/dL (ref 30.0–36.0)
MCV: 89.6 fL (ref 80.0–100.0)
Platelets: 358 10*3/uL (ref 150–400)
RBC: 3.36 MIL/uL — ABNORMAL LOW (ref 3.87–5.11)
RDW: 12.9 % (ref 11.5–15.5)
WBC: 4 10*3/uL (ref 4.0–10.5)
nRBC: 0 % (ref 0.0–0.2)

## 2022-03-24 LAB — BASIC METABOLIC PANEL
Anion gap: 7 (ref 5–15)
BUN: 8 mg/dL (ref 6–20)
CO2: 23 mmol/L (ref 22–32)
Calcium: 9.6 mg/dL (ref 8.9–10.3)
Chloride: 108 mmol/L (ref 98–111)
Creatinine, Ser: 0.61 mg/dL (ref 0.44–1.00)
GFR, Estimated: >60 mL/min (ref >60)
Glucose, Bld: 90 mg/dL (ref 70–99)
Potassium: 3.8 mmol/L (ref 3.5–5.1)
Sodium: 138 mmol/L (ref 135–145)

## 2022-03-24 NOTE — Progress Notes (Signed)
PCP - Dr. Donnetta Hutching Cardiologist - Dr. Lyman Bishop (pt states she does not see Cardiology anymore, that the cardiac issues she was experiencing were related to her chemotherapy)  PPM/ICD - denies   Chest x-ray - 09/09/21 EKG - 12/14/21 Stress Test - denies ECHO - 10/11/21 Cardiac Cath - denies  Sleep Study - denies   DM- denies  ASA/Blood Thinner Instructions: n/a   ERAS Protcol - yes, no drink   COVID TEST- n/a   Anesthesia review: yes, cardiac hx  Patient denies shortness of breath, fever, cough and chest pain at PAT appointment   All instructions explained to the patient, with a verbal understanding of the material. Patient agrees to go over the instructions while at home for a better understanding. The opportunity to ask questions was provided.

## 2022-03-25 ENCOUNTER — Ambulatory Visit: Payer: No Typology Code available for payment source | Admitting: Gastroenterology

## 2022-03-25 ENCOUNTER — Telehealth: Payer: Self-pay | Admitting: Internal Medicine

## 2022-03-25 ENCOUNTER — Telehealth: Payer: Self-pay | Admitting: *Deleted

## 2022-03-25 ENCOUNTER — Ambulatory Visit (HOSPITAL_BASED_OUTPATIENT_CLINIC_OR_DEPARTMENT_OTHER): Admit: 2022-03-25 | Payer: No Typology Code available for payment source | Admitting: Plastic Surgery

## 2022-03-25 ENCOUNTER — Encounter (HOSPITAL_BASED_OUTPATIENT_CLINIC_OR_DEPARTMENT_OTHER): Payer: Self-pay

## 2022-03-25 SURGERY — BREAST RECONSTRUCTION WITH PLACEMENT OF TISSUE EXPANDER AND FLEX HD (ACELLULAR HYDRATED DERMIS)
Anesthesia: General | Site: Breast | Laterality: Bilateral

## 2022-03-25 NOTE — Telephone Encounter (Signed)
Pt agreeable to plan of care for tele pre op appt 03/27/22; per pre op provider add pt on to 03/27/22. Med rec and consent are done.

## 2022-03-25 NOTE — Telephone Encounter (Signed)
Primary Cardiologist:Kenneth C Hilty, MD   Preoperative team, please contact this patient and set up a phone call appointment for further preoperative risk assessment. Please obtain consent and complete medication review. Thank you for your help.   I confirm that guidance regarding antiplatelet and oral anticoagulation therapy has been completed and, if necessary, noted below (none requested).   Emmaline Life, NP-C     03/25/2022, 5:07 PM [address] Office [phone] Fax [phone]

## 2022-03-25 NOTE — Telephone Encounter (Signed)
   Pre-operative Risk Assessment    Patient Name: Amber White  DOB: 1975/10/05 MRN: [id number]      Request for Surgical Clearance    Procedure:   B/L BREAST RECONSTRUCTION WITH PLACEMENT OF TISSUE EXPANDERS, RIGHT LATISSIMUS DORSI FLAP, ACELLULAR DERMIS TO LEFT CHEST  Date of Surgery:  Clearance 04/01/22 URGENT                                Surgeon:  DR. Irene Limbo Surgeon's Group or Practice Name:  [facility] Phone number:  [phone] Fax number:  [phone]   Type of Clearance Requested:   - Medical ; NO MEDICATIONS LISTED AS NEEDING TO BE HELD   Type of Anesthesia:  General    Additional requests/questions:    Jiles Prows   03/25/2022, 2:44 PM

## 2022-03-25 NOTE — Telephone Encounter (Signed)
Pt agreeable to plan of care for tele pre op appt 03/27/22; per pre op provider add pt on to 03/27/22. Med rec and consent are done.       Patient Consent for Virtual Visit        Amber White has provided verbal consent on 03/25/2022 for a virtual visit (video or telephone).   CONSENT FOR VIRTUAL VISIT FOR:  Amber White  By participating in this virtual visit I agree to the following:  I hereby voluntarily request, consent and authorize [facility] and its employed or contracted physicians, physician assistants, nurse practitioners or other licensed health care professionals (the Practitioner), to provide me with telemedicine health care services (the "Services") as deemed necessary by the treating Practitioner. I acknowledge and consent to receive the Services by the Practitioner via telemedicine. I understand that the telemedicine visit will involve communicating with the Practitioner through live audiovisual communication technology and the disclosure of certain medical information by electronic transmission. I acknowledge that I have been given the opportunity to request an in-person assessment or other available alternative prior to the telemedicine visit and am voluntarily participating in the telemedicine visit.  I understand that I have the right to withhold or withdraw my consent to the use of telemedicine in the course of my care at any time, without affecting my right to future care or treatment, and that the Practitioner or I may terminate the telemedicine visit at any time. I understand that I have the right to inspect all information obtained and/or recorded in the course of the telemedicine visit and may receive copies of available information for a reasonable fee.  I understand that some of the potential risks of receiving the Services via telemedicine include:  Delay or interruption in medical evaluation due to technological equipment failure or disruption; Information  transmitted may not be sufficient (e.g. poor resolution of images) to allow for appropriate medical decision making by the Practitioner; and/or  In rare instances, security protocols could fail, causing a breach of personal health information.  Furthermore, I acknowledge that it is my responsibility to provide information about my medical history, conditions and care that is complete and accurate to the best of my ability. I acknowledge that Practitioner's advice, recommendations, and/or decision may be based on factors not within their control, such as incomplete or inaccurate data provided by me or distortions of diagnostic images or specimens that may result from electronic transmissions. I understand that the practice of medicine is not an exact science and that Practitioner makes no warranties or guarantees regarding treatment outcomes. I acknowledge that a copy of this consent can be made available to me via my patient portal ([facility]), or I can request a printed copy by calling the office of [facility].    I understand that my insurance will be billed for this visit.   I have read or had this consent read to me. I understand the contents of this consent, which adequately explains the benefits and risks of the Services being provided via telemedicine.  I have been provided ample opportunity to ask questions regarding this consent and the Services and have had my questions answered to my satisfaction. I give my informed consent for the services to be provided through the use of telemedicine in my medical care

## 2022-03-25 NOTE — Telephone Encounter (Signed)
   Pre-operative Risk Assessment    Patient Name: Amber White  DOB: October 19, 1975 MRN: [id number]      Request for Surgical Clearance    Procedure:   Bilateral breast reconstruction with placement of tissue expanders.   Date of Surgery:  Clearance 04/01/22                                 Surgeon:  Dr. Celedonio Miyamoto Surgeon's Group or Practice Name:  [facility] Phone number:  [phone] Fax number:  [phone]   Type of Clearance Requested:   - Medical    Type of Anesthesia:  General    Additional requests/questions:   Caller stated they will need medical clearance and will be faxing form to be signed.  Signed, Heloise Beecham   03/25/2022, 12:45 PM

## 2022-03-27 ENCOUNTER — Encounter: Payer: Self-pay | Admitting: Hematology and Oncology

## 2022-03-27 ENCOUNTER — Telehealth: Payer: Self-pay | Admitting: *Deleted

## 2022-03-27 ENCOUNTER — Encounter: Payer: Self-pay | Admitting: Adult Health

## 2022-03-27 ENCOUNTER — Ambulatory Visit: Payer: No Typology Code available for payment source | Attending: Internal Medicine | Admitting: Physician Assistant

## 2022-03-27 DIAGNOSIS — Z01810 Encounter for preprocedural cardiovascular examination: Secondary | ICD-10-CM

## 2022-03-27 NOTE — Telephone Encounter (Signed)
Attempted to reach the patient to make her aware that she must complete Echo and have a follow up after before she can be cleared for upcoming procedure.   Contacted requested physicians office and made them aware that we are unable to reach the patient.

## 2022-03-27 NOTE — Telephone Encounter (Signed)
Patient was on schedule for virtual visit today. H/o NICM felt chemotherapy induced. However, per chart review, at last OV 08/2021 she was complaining of profound dyspnea and fatigue, therefore echocardiogram recommended along with close follow-up in 09/2021 but do not see this was ever completed. I tried to call patient but could not reach her. I will reach out to our callback team to recommend re-scheduling the echocardiogram that was previously recommended along with clinic follow-up, and to request they notify requesting surgeon of this recommendation. Final recs will be provided in office follow-up.

## 2022-03-27 NOTE — Progress Notes (Signed)
Patient was on schedule for virtual visit today. However, per chart review, at last OV 08/2021 she was complaining of profound dyspnea and fatigue, therefore echocardiogram recommended along with close follow-up in 09/2021 but do not see this was ever completed. I tried to call patient but could not reach her. I will reach out to our callback team to recommend re-scheduling the echocardiogram that was previously recommended along with clinic follow-up, and to request they notify requesting surgeon of this recommendation.

## 2022-03-27 NOTE — Telephone Encounter (Signed)
Duplicate.  See other phone note.

## 2022-03-27 NOTE — Telephone Encounter (Signed)
Amber White states surgeon wants her to have another ECHO prior to surgery on Tuesday. She needs an order for ECHO and help getting it scheduled

## 2022-03-27 NOTE — Telephone Encounter (Signed)
  Pt returned call, advised that she needs to schedule her echo and f/u. We tried to schedule her echo and first available is on 09/25, she hold off scheduling her echo because her procedure is next week, she said she will call her doctor first before scheduling any appt.

## 2022-03-28 ENCOUNTER — Ambulatory Visit (HOSPITAL_COMMUNITY)
Admission: RE | Admit: 2022-03-28 | Discharge: 2022-03-28 | Disposition: A | Discharge status: Home / Self Care | Payer: No Typology Code available for payment source | Source: Ambulatory Visit | Attending: Hematology and Oncology | Admitting: Hematology and Oncology

## 2022-03-28 ENCOUNTER — Other Ambulatory Visit: Payer: Self-pay

## 2022-03-28 ENCOUNTER — Encounter: Payer: Self-pay | Admitting: Plastic Surgery

## 2022-03-28 ENCOUNTER — Telehealth: Payer: Self-pay | Admitting: *Deleted

## 2022-03-28 DIAGNOSIS — C50411 Malignant neoplasm of upper-outer quadrant of right female breast: Secondary | ICD-10-CM | POA: Insufficient documentation

## 2022-03-28 DIAGNOSIS — I429 Cardiomyopathy, unspecified: Secondary | ICD-10-CM | POA: Diagnosis not present

## 2022-03-28 DIAGNOSIS — Z0189 Encounter for other specified special examinations: Secondary | ICD-10-CM | POA: Diagnosis not present

## 2022-03-28 DIAGNOSIS — I10 Essential (primary) hypertension: Secondary | ICD-10-CM | POA: Diagnosis not present

## 2022-03-28 DIAGNOSIS — Z171 Estrogen receptor negative status [ER-]: Secondary | ICD-10-CM | POA: Insufficient documentation

## 2022-03-28 DIAGNOSIS — Z01810 Encounter for preprocedural cardiovascular examination: Secondary | ICD-10-CM | POA: Insufficient documentation

## 2022-03-28 DIAGNOSIS — I088 Other rheumatic multiple valve diseases: Secondary | ICD-10-CM

## 2022-03-28 DIAGNOSIS — R0600 Dyspnea, unspecified: Secondary | ICD-10-CM | POA: Insufficient documentation

## 2022-03-28 LAB — ECHOCARDIOGRAM COMPLETE
Area-P 1/2: 5.13 cm2
S' Lateral: 3.1 cm

## 2022-03-28 NOTE — Telephone Encounter (Signed)
Would be best to get echocardiogram prior to appointment, likely won't be available for read until Monday. This would help guide whether further ischemic testing is needed prior to surgery. OK to move appointment to Monday but would also come with the caveat that this does not guarantee clearance at OV if still having symptoms. Thanks.

## 2022-03-28 NOTE — Telephone Encounter (Signed)
Pt agreeable to IN OFFICE appt for pre op. Per Melina Copa, PAC , looks like pt was also supposed have echo but this was never done. See notes from Melina Copa, [facility]  I will update the surgeon office as well. Pt thanked me for the call and the help.

## 2022-03-28 NOTE — Telephone Encounter (Signed)
Pt is agreeable to move appt to 03/31/22 @ 2:45. I d/w Melina Copa, PAC to see new notes from surgeon. Per Melina Copa, PAC she moved pt appt to 03/31/22 @ 2:45 with Doreene Adas, PAC. Pt had her echo done today that was needed for pre op clearance.   Melina Copa, PAC did then tell me if the pt wanted to be seen today, sehe would see her at 3:35 as she had a opening now. I tried to call the pt right back after just hanging up with her x 2 but she did  not answer. We were going to offer the 3:35 today. However if the pt calls back that appt may be gone by that time.   I will send DYI to surgeon as well.

## 2022-03-28 NOTE — Telephone Encounter (Signed)
I just s/w the pt and she cannot do the appt today at 3:35 her daughter has a soccer game today. Pt will keep the 03/31/22 @ 2:45 appt.

## 2022-03-28 NOTE — Telephone Encounter (Signed)
Pt agreeable to IN OFFICE appt for pre op. Per Melina Copa, PAC , looks like pt was also supposed have echo but this was never done. See notes from Melina Copa, [facility]  I will update the surgeon office as well. Pt thanked me for the call and the help.

## 2022-03-28 NOTE — Telephone Encounter (Signed)
I will forward to pre op provider to see notes below. Pt is not wanting to schedule her echo at this time. Is this echo needed for pre op clearance. Surgeon is asking for appt to be moved up if possible.   Please advise if echo is needed for pre op clearance.   All Conversations: clearance 04/01/22 (Newest Message First) March 28, 2022 Irene Limbo, MD to Me  Almyra Deforest, [place]      03/28/22 10:33 AM  I appreciate everyone's work on this. In Epic I see her ECHO scheduled for today and an appt scheduled for 04/02/22. Her surgery date is 9.19.23 (Tuesday).  I know this is large ask if she can be seen today or Monday in an attempt to not cancel/reschedule her surgery.   Thank you   Irene Limbo, MD [facility]  Plastic & Reconstructive Surgery   Office/ physician access line after hours [phone]  Cell [phone]           03/28/22  9:19 AM You routed this conversation to Almyra Deforest, [place]  Irene Limbo, MD   Me      03/28/22  9:19 AM Note Pt agreeable to IN OFFICE appt for pre op. Per Melina Copa, PAC , looks like pt was also supposed have echo but this was never done. See notes from Melina Copa, [facility]   I will update the surgeon office as well. Pt thanked me for the call and the help.

## 2022-03-28 NOTE — Progress Notes (Signed)
Pt called and states she needs an echo before surgery. Orders entered per MD. S/w Butch Penny in cardiovascular who will call pt to schedule.

## 2022-03-31 ENCOUNTER — Encounter: Payer: Self-pay | Admitting: Cardiology

## 2022-03-31 ENCOUNTER — Ambulatory Visit: Payer: No Typology Code available for payment source | Admitting: Physician Assistant

## 2022-03-31 ENCOUNTER — Encounter: Payer: No Typology Code available for payment source | Admitting: Surgical

## 2022-03-31 ENCOUNTER — Ambulatory Visit: Payer: No Typology Code available for payment source | Attending: Physician Assistant | Admitting: Cardiology

## 2022-03-31 VITALS — BP 126/82 | HR 64 | Ht 64.0 in | Wt 218.8 lb

## 2022-03-31 DIAGNOSIS — I429 Cardiomyopathy, unspecified: Secondary | ICD-10-CM | POA: Diagnosis not present

## 2022-03-31 DIAGNOSIS — Z853 Personal history of malignant neoplasm of breast: Secondary | ICD-10-CM

## 2022-03-31 DIAGNOSIS — Z01810 Encounter for preprocedural cardiovascular examination: Secondary | ICD-10-CM | POA: Diagnosis not present

## 2022-03-31 NOTE — Patient Instructions (Signed)
Medication Instructions:  Your physician recommends that you continue on your current medications as directed. Please refer to the Current Medication list given to you today.  *If you need a refill on your cardiac medications before your next appointment, please call your pharmacy*   Lab Work: NONE ordered at this time of appointment   If you have labs (blood work) drawn today and your tests are completely normal, you will receive your results only by: [place] (if you have MyChart) OR A paper copy in the mail If you have any lab test that is abnormal or we need to change your treatment, we will call you to review the results.   Testing/Procedures: NONE ordered at this time of appointment     Follow-Up: At [facility], you and your health needs are our priority.  As part of our continuing mission to provide you with exceptional heart care, we have created designated Provider Care Teams.  These Care Teams include your primary Cardiologist (physician) and Advanced Practice Providers (APPs -  Physician Assistants and Nurse Practitioners) who all work together to provide you with the care you need, when you need it.  We recommend signing up for the patient portal called "MyChart".  Sign up information is provided on this After Visit Summary.  MyChart is used to connect with patients for Virtual Visits (Telemedicine).  Patients are able to view lab/test results, encounter notes, upcoming appointments, etc.  Non-urgent messages can be sent to your provider as well.   To learn more about what you can do with MyChart, go to [email or web address].    Your next appointment:   As Needed   The format for your next appointment:   In Person  Provider:   Dr.Turner

## 2022-03-31 NOTE — Progress Notes (Signed)
Cardiology Office Note:    Date:  03/31/2022   ID:  Amber White, DOB [birth year], MRN [id number]  PCP:  [name], MD  Cardiologist:  Pixie Casino, MD  Electrophysiologist:  None   Referring MD: [name], MD   " I am doing fine"  History of Present Illness:    Amber White is a 46 y.o. female with a hx of dilated nonischemic cardiomyopathy, breast cancer status post right modified radical mastectomy and left prophylactic mastectomy for treatment of invasive ductal carcinoma of the right breast (Malignant neoplasm of upper-outer quadrant of right breast in female, estrogen receptor negative Stage IIIC cT4, cN1, cM0, G3, ER-, PR-, HER2: Equivocal)  left mastectomy  (benign) with axillary metastasis status post neoadjuvant chemotherapy on 05/21/2021 to 05/24/2021., morbid obesity here today for follow-up visit.  The patient was asked to see cardiology as she is preparing for a procedure for breast implants tomorrow.  She offers no complaints today.  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   Port-A-Cath in place 11/22/2020    Past Surgical History:  Procedure Laterality Date   CHOLECYSTECTOMY     MODIFIED MASTECTOMY Right 06/27/2021   Procedure: RIGHT MODIFIED RADICAL MASTECTOMY;  Surgeon: Donnie Mesa, MD;  Location: [place];  Service: General;  Laterality: Right;   PORT-A-CATH REMOVAL Right 11/05/2021   Procedure: REMOVAL PORT-A-CATH;  Surgeon: Donnie Mesa, MD;  Location: [place];  Service: General;  Laterality: Right;   PORTACATH PLACEMENT N/A  11/21/2020   Procedure: INSERTION PORT-A-CATH;  Surgeon: Donnie Mesa, MD;  Location: [place];  Service: General;  Laterality: N/A;   TOTAL MASTECTOMY Left 06/27/2021   Procedure: LEFT TOTAL MASTECTOMY;  Surgeon: Donnie Mesa, MD;  Location: [place];  Service: General;  Laterality: Left;    Current Medications: Current Meds  Medication Sig   amphetamine-dextroamphetamine (ADDERALL) 30 MG tablet Take 30 mg by mouth daily as needed (focus).   bisoprolol (ZEBETA) 10 MG tablet TAKE 1 TABLET BY MOUTH EVERY DAY   ferrous sulfate 325 (65 FE) MG tablet Take 325 mg by mouth daily.   hydrocortisone (CORTEF) 5 MG tablet Take 2 tablets (10 mg total) by mouth in the morning AND 1 tablet (5 mg total) daily before supper. (Patient taking differently: Take  5 mg by mouth in the morning and 2.5 mg daily before bedtime.)   Multiple Vitamin (MULTIVITAMIN WITH MINERALS) TABS tablet Take 1 tablet by mouth daily.     Allergies:   Paclitaxel and Penicillins   Social History   Socioeconomic History   Marital status: Single    Spouse name: Not on file   Number of children: 4   Years of education: Not on file   Highest education level: Not on file  Occupational History   Not on file  Tobacco Use   Smoking status: Never   Smokeless tobacco: Never  Vaping Use   Vaping Use: Never used  Substance and Sexual Activity   Alcohol use: Yes  Comment: rare (maybe once every 3-4 months)   Drug use: No   Sexual activity: Yes    Birth control/protection: None  Other Topics Concern   Not on file  Social History Narrative   Not on file   Social Determinants of Health   Financial Resource Strain: Not on file  Food Insecurity: Not on file  Transportation Needs: Not on file  Physical Activity: Not on file  Stress: Not on file  Social Connections: Not on file     Family History: The patient's family history includes Breast cancer in her mother; Diabetes in her maternal grandmother and  maternal uncle; Heart disease in her maternal grandmother.  ROS:   Review of Systems  Constitution: Negative for decreased appetite, fever and weight gain.  HENT: Negative for congestion, ear discharge, hoarse voice and sore throat.   Eyes: Negative for discharge, redness, vision loss in right eye and visual halos.  Cardiovascular: Negative for chest pain, dyspnea on exertion, leg swelling, orthopnea and palpitations.  Respiratory: Negative for cough, hemoptysis, shortness of breath and snoring.   Endocrine: Negative for heat intolerance and polyphagia.  Hematologic/Lymphatic: Negative for bleeding problem. Does not bruise/bleed easily.  Skin: Negative for flushing, nail changes, rash and suspicious lesions.  Musculoskeletal: Negative for arthritis, joint pain, muscle cramps, myalgias, neck pain and stiffness.  Gastrointestinal: Negative for abdominal pain, bowel incontinence, diarrhea and excessive appetite.  Genitourinary: Negative for decreased libido, genital sores and incomplete emptying.  Neurological: Negative for brief paralysis, focal weakness, headaches and loss of balance.  Psychiatric/Behavioral: Negative for altered mental status, depression and suicidal ideas.  Allergic/Immunologic: Negative for HIV exposure and persistent infections.    EKGs/Labs/Other Studies Reviewed:    The following studies were reviewed today:   EKG:  None today    TTE 03/28/2022 IMPRESSIONS   1. Left ventricular ejection fraction, by estimation, is 55 to 60%. The  left ventricle has normal function. The left ventricle has no regional  wall motion abnormalities. Left ventricular diastolic parameters were  normal. The average left ventricular  global longitudinal strain is -19.1 %. The global longitudinal strain is  normal.   2. Right ventricular systolic function is normal. The right ventricular  size is normal. Tricuspid regurgitation signal is inadequate for assessing  PA pressure.   3. The  mitral valve is grossly normal. Trivial mitral valve  regurgitation. No evidence of mitral stenosis.   4. The aortic valve is tricuspid. Aortic valve regurgitation is not  visualized. No aortic stenosis is present.   5. The inferior vena cava is normal in size with greater than 50%  respiratory variability, suggesting right atrial pressure of 3 mmHg.   Conclusion(s)/Recommendation(s): Normal biventricular function without  evidence of hemodynamically significant valvular heart disease.   FINDINGS   Left Ventricle: Left ventricular ejection fraction, by estimation, is 55  to 60%. The left ventricle has normal function. The left ventricle has no  regional wall motion abnormalities. The average left ventricular global  longitudinal strain is -19.1 %.  The global longitudinal strain is normal. 3D left ventricular ejection  fraction analysis performed but not reported based on interpreter  judgement due to suboptimal tracking. The left ventricular internal cavity  size was normal in size. There is no left  ventricular hypertrophy. Left ventricular diastolic parameters were  normal.   Right Ventricle: The right ventricular size is normal. No increase in  right ventricular wall thickness. Right ventricular systolic function is  normal. Tricuspid regurgitation signal is inadequate for  assessing PA  pressure.   Left Atrium: Left atrial size was normal in size.   Right Atrium: Right atrial size was normal in size.   Pericardium: There is no evidence of pericardial effusion.   Mitral Valve: The mitral valve is grossly normal. Trivial mitral valve  regurgitation. No evidence of mitral valve stenosis.   Tricuspid Valve: The tricuspid valve is grossly normal. Tricuspid valve  regurgitation is mild . No evidence of tricuspid stenosis.   Aortic Valve: The aortic valve is tricuspid. Aortic valve regurgitation is  not visualized. No aortic stenosis is present.   Pulmonic Valve: The pulmonic  valve was grossly normal. Pulmonic valve  regurgitation is mild. No evidence of pulmonic stenosis.   Aorta: The aortic root and ascending aorta are structurally normal, with  no evidence of dilitation.   Venous: The inferior vena cava is normal in size with greater than 50%  respiratory variability, suggesting right atrial pressure of 3 mmHg.   IAS/Shunts: The atrial septum is grossly normal.   Recent Labs: 05/21/2021: Magnesium 1.7; TSH 0.172 09/09/2021: B Natriuretic Peptide 57.8 12/14/2021: ALT 11 03/24/2022: BUN 8; Creatinine, Ser 0.61; Hemoglobin 9.8; Platelets 358; Potassium 3.8; Sodium 138  Recent Lipid Panel No results found for: "CHOL", "TRIG", "HDL", "CHOLHDL", "VLDL", "LDLCALC", "LDLDIRECT"  Physical Exam:    VS:  BP 126/82   Pulse 64   Ht $R'5\' 4"'PM$  (1.626 m)   Wt 218 lb 12.8 oz (99.2 kg)   BMI 37.56 kg/m     Wt Readings from Last 3 Encounters:  03/31/22 218 lb 12.8 oz (99.2 kg)  03/24/22 220 lb 14.4 oz (100.2 kg)  02/07/22 231 lb 6.4 oz (105 kg)     GEN: Well nourished, well developed in no acute distress HEENT: Normal NECK: No JVD; No carotid bruits LYMPHATICS: No lymphadenopathy CARDIAC: S1S2 noted,RRR, no murmurs, rubs, gallops RESPIRATORY:  Clear to auscultation without rales, wheezing or rhonchi  ABDOMEN: Soft, non-tender, non-distended, +bowel sounds, no guarding. EXTREMITIES: No edema, No cyanosis, no clubbing MUSCULOSKELETAL:  No deformity  SKIN: Warm and dry NEUROLOGIC:  Alert and oriented x 3, non-focal PSYCHIATRIC:  Normal affect, good insight  ASSESSMENT:    1. Preop cardiovascular exam   2. History of breast cancer   3. Cardiomyopathy, unspecified type ([place])    PLAN:     The patient does not have any unstable cardiac conditions.  Upon evaluation today, she can achieve 4 METs or greater without anginal symptoms.  According to [facility] and AHA guidelines, she requires no further cardiac workup prior to her noncardiac surgery and should be at acceptable  risk.  Our service is available as necessary in the perioperative period.  We discussed her echocardiogram result.  All of her questions has been answered.  Her echocardiogram has resolved.  The patient is in agreement with the above plan. The patient left the office in stable condition.  The patient will follow up in   Medication Adjustments/Labs and Tests Ordered: Current medicines are reviewed at length with the patient today.  Concerns regarding medicines are outlined above.  No orders of the defined types were placed in this encounter.  No orders of the defined types were placed in this encounter.   Patient Instructions  Medication Instructions:  Your physician recommends that you continue on your current medications as directed. Please refer to the Current Medication list given to you today.  *If you need a refill on your cardiac medications before your next appointment, please call your pharmacy*  Lab Work: NONE ordered at this time of appointment   If you have labs (blood work) drawn today and your tests are completely normal, you will receive your results only by: [place] (if you have MyChart) OR A paper copy in the mail If you have any lab test that is abnormal or we need to change your treatment, we will call you to review the results.   Testing/Procedures: NONE ordered at this time of appointment     Follow-Up: At [facility], you and your health needs are our priority.  As part of our continuing mission to provide you with exceptional heart care, we have created designated Provider Care Teams.  These Care Teams include your primary Cardiologist (physician) and Advanced Practice Providers (APPs -  Physician Assistants and Nurse Practitioners) who all work together to provide you with the care you need, when you need it.  We recommend signing up for the patient portal called "MyChart".  Sign up information is provided on this After Visit Summary.   MyChart is used to connect with patients for Virtual Visits (Telemedicine).  Patients are able to view lab/test results, encounter notes, upcoming appointments, etc.  Non-urgent messages can be sent to your provider as well.   To learn more about what you can do with MyChart, go to [email or web address].    Your next appointment:   As Needed   The format for your next appointment:   In Person  Provider:   Dr.Turner    Adopting a Healthy Lifestyle.  Know what a healthy weight is for you (roughly BMI <25) and aim to maintain this   Aim for 7+ servings of fruits and vegetables daily   65-80+ fluid ounces of water or unsweet tea for healthy kidneys   Limit to max 1 drink of alcohol per day; avoid smoking/tobacco   Limit animal fats in diet for cholesterol and heart health - choose grass fed whenever available   Avoid highly processed foods, and foods high in saturated/trans fats   Aim for low stress - take time to unwind and care for your mental health   Aim for 150 min of moderate intensity exercise weekly for heart health, and weights twice weekly for bone health   Aim for 7-9 hours of sleep daily   When it comes to diets, agreement about the perfect plan isnt easy to find, even among the experts. Experts at the [employer] developed an idea known as the Healthy Eating Plate. Just imagine a plate divided into logical, healthy portions.   The emphasis is on diet quality:   Load up on vegetables and fruits - one-half of your plate: Aim for color and variety, and remember that potatoes dont count.   Go for whole grains - one-quarter of your plate: Whole wheat, barley, wheat berries, quinoa, oats, brown rice, and foods made with them. If you want pasta, go with whole wheat pasta.   Protein power - one-quarter of your plate: Fish, chicken, beans, and nuts are all healthy, versatile protein sources. Limit red meat.   The diet, however, does go beyond the  plate, offering a few other suggestions.   Use healthy plant oils, such as olive, canola, soy, corn, sunflower and peanut. Check the labels, and avoid partially hydrogenated oil, which have unhealthy trans fats.   If youre thirsty, drink water. Coffee and tea are good in moderation, but skip sugary drinks and limit milk and dairy products to one or two  daily servings.   The type of carbohydrate in the diet is more important than the amount. Some sources of carbohydrates, such as vegetables, fruits, whole grains, and beans-are healthier than others.   Finally, stay active  Signed, Berniece Salines, DO  03/31/2022 5:11 PM    [facility]

## 2022-04-01 ENCOUNTER — Inpatient Hospital Stay (HOSPITAL_COMMUNITY)
Admission: RE | Admit: 2022-04-01 | Discharge: 2022-04-05 | DRG: 577 | Disposition: A | Discharge status: Home / Self Care | Payer: No Typology Code available for payment source | Attending: Plastic Surgery | Admitting: Plastic Surgery

## 2022-04-01 ENCOUNTER — Encounter (HOSPITAL_COMMUNITY): Payer: Self-pay | Admitting: Plastic Surgery

## 2022-04-01 ENCOUNTER — Other Ambulatory Visit: Payer: Self-pay

## 2022-04-01 ENCOUNTER — Ambulatory Visit (HOSPITAL_COMMUNITY): Payer: No Typology Code available for payment source | Admitting: Physician Assistant

## 2022-04-01 ENCOUNTER — Encounter (HOSPITAL_COMMUNITY)
Admission: RE | Disposition: A | Discharge status: Home / Self Care | Payer: Self-pay | Source: Ambulatory Visit | Attending: Plastic Surgery

## 2022-04-01 ENCOUNTER — Ambulatory Visit (HOSPITAL_COMMUNITY): Payer: No Typology Code available for payment source | Admitting: Certified Registered"

## 2022-04-01 DIAGNOSIS — I96 Gangrene, not elsewhere classified: Secondary | ICD-10-CM | POA: Diagnosis present

## 2022-04-01 DIAGNOSIS — Z9221 Personal history of antineoplastic chemotherapy: Secondary | ICD-10-CM

## 2022-04-01 DIAGNOSIS — R1110 Vomiting, unspecified: Secondary | ICD-10-CM | POA: Diagnosis not present

## 2022-04-01 DIAGNOSIS — D649 Anemia, unspecified: Secondary | ICD-10-CM | POA: Diagnosis present

## 2022-04-01 DIAGNOSIS — Z923 Personal history of irradiation: Secondary | ICD-10-CM | POA: Diagnosis not present

## 2022-04-01 DIAGNOSIS — Z803 Family history of malignant neoplasm of breast: Secondary | ICD-10-CM | POA: Diagnosis not present

## 2022-04-01 DIAGNOSIS — Z421 Encounter for breast reconstruction following mastectomy: Principal | ICD-10-CM

## 2022-04-01 DIAGNOSIS — T86828 Other complications of skin graft (allograft) (autograft): Secondary | ICD-10-CM | POA: Diagnosis present

## 2022-04-01 DIAGNOSIS — G629 Polyneuropathy, unspecified: Secondary | ICD-10-CM | POA: Diagnosis present

## 2022-04-01 DIAGNOSIS — I959 Hypotension, unspecified: Secondary | ICD-10-CM | POA: Diagnosis not present

## 2022-04-01 DIAGNOSIS — N641 Fat necrosis of breast: Secondary | ICD-10-CM | POA: Diagnosis not present

## 2022-04-01 DIAGNOSIS — Z853 Personal history of malignant neoplasm of breast: Secondary | ICD-10-CM

## 2022-04-01 DIAGNOSIS — L732 Hidradenitis suppurativa: Secondary | ICD-10-CM | POA: Diagnosis present

## 2022-04-01 DIAGNOSIS — Z9013 Acquired absence of bilateral breasts and nipples: Secondary | ICD-10-CM

## 2022-04-01 HISTORY — PX: APPLICATION OF A-CELL OF CHEST/ABDOMEN: SHX6302

## 2022-04-01 HISTORY — PX: TISSUE EXPANDER PLACEMENT: SHX2530

## 2022-04-01 HISTORY — PX: LATISSIMUS FLAP TO BREAST: SHX5357

## 2022-04-01 SURGERY — INSERTION, TISSUE EXPANDER
Anesthesia: General | Site: Chest | Laterality: Right

## 2022-04-01 MED ORDER — ONDANSETRON HCL 4 MG/2ML IJ SOLN
INTRAMUSCULAR | Status: DC | PRN
  Administered 2022-04-01: 4 mg via INTRAVENOUS

## 2022-04-01 MED ORDER — MIDAZOLAM HCL 2 MG/2ML IJ SOLN
INTRAMUSCULAR | Status: DC | PRN
  Administered 2022-04-01: 2 mg via INTRAVENOUS

## 2022-04-01 MED ORDER — VANCOMYCIN HCL IN DEXTROSE 1-5 GM/200ML-% IV SOLN
1000.0000 mg | INTRAVENOUS | Status: AC
  Administered 2022-04-01: 1000 mg via INTRAVENOUS
  Filled 2022-04-01: qty 200

## 2022-04-01 MED ORDER — PHENYLEPHRINE HCL-NACL 20-0.9 MG/250ML-% IV SOLN
INTRAVENOUS | Status: DC | PRN
  Administered 2022-04-01: 25 ug/min via INTRAVENOUS

## 2022-04-01 MED ORDER — ROCURONIUM BROMIDE 10 MG/ML (PF) SYRINGE
PREFILLED_SYRINGE | INTRAVENOUS | Status: AC
  Filled 2022-04-01: qty 10

## 2022-04-01 MED ORDER — SODIUM CHLORIDE (PF) 0.9 % IJ SOLN
INTRAMUSCULAR | Status: AC
  Filled 2022-04-01: qty 10

## 2022-04-01 MED ORDER — HYDROCORTISONE 5 MG PO TABS
2.5000 mg | ORAL_TABLET | Freq: Every evening | ORAL | Status: DC
  Administered 2022-04-01 – 2022-04-04 (×4): 2.5 mg via ORAL
  Filled 2022-04-01 (×4): qty 1

## 2022-04-01 MED ORDER — HEPARIN SODIUM (PORCINE) 5000 UNIT/ML IJ SOLN
5000.0000 [IU] | Freq: Once | INTRAMUSCULAR | Status: AC
  Administered 2022-04-01: 5000 [IU] via SUBCUTANEOUS
  Filled 2022-04-01: qty 1

## 2022-04-01 MED ORDER — CHLORHEXIDINE GLUCONATE CLOTH 2 % EX PADS: 6.0000 | MEDICATED_PAD | Freq: Once | CUTANEOUS | Status: DC

## 2022-04-01 MED ORDER — KCL IN DEXTROSE-NACL 20-5-0.45 MEQ/L-%-% IV SOLN
INTRAVENOUS | Status: DC
  Filled 2022-04-01: qty 1000

## 2022-04-01 MED ORDER — OXYCODONE HCL 5 MG PO TABS: 5.0000 mg | ORAL_TABLET | Freq: Once | ORAL | Status: AC | PRN

## 2022-04-01 MED ORDER — SODIUM CHLORIDE 0.9 % IV SOLN
12.5000 mg | Freq: Four times a day (QID) | INTRAVENOUS | Status: DC | PRN
  Administered 2022-04-01: 12.5 mg via INTRAVENOUS
  Filled 2022-04-01: qty 0.5
  Filled 2022-04-01: qty 12.5
  Filled 2022-04-01: qty 0.5

## 2022-04-01 MED ORDER — VANCOMYCIN HCL IN DEXTROSE 1-5 GM/200ML-% IV SOLN
1000.0000 mg | Freq: Once | INTRAVENOUS | Status: AC
  Administered 2022-04-01: 1000 mg via INTRAVENOUS
  Filled 2022-04-01: qty 200

## 2022-04-01 MED ORDER — LACTATED RINGERS IV SOLN: INTRAVENOUS | Status: DC

## 2022-04-01 MED ORDER — SUFENTANIL CITRATE 50 MCG/ML IV SOLN
INTRAVENOUS | Status: AC
  Filled 2022-04-01: qty 1

## 2022-04-01 MED ORDER — PROPOFOL 10 MG/ML IV BOLUS
INTRAVENOUS | Status: DC | PRN
  Administered 2022-04-01: 150 mg via INTRAVENOUS

## 2022-04-01 MED ORDER — OXYCODONE HCL 5 MG PO TABS
5.0000 mg | ORAL_TABLET | ORAL | Status: DC | PRN
  Administered 2022-04-02 – 2022-04-04 (×7): 10 mg via ORAL
  Filled 2022-04-01 (×8): qty 2

## 2022-04-01 MED ORDER — PHENYLEPHRINE HCL (PRESSORS) 10 MG/ML IV SOLN
INTRAVENOUS | Status: DC | PRN
  Administered 2022-04-01: 160 ug via INTRAVENOUS
  Administered 2022-04-01: 80 ug via INTRAVENOUS

## 2022-04-01 MED ORDER — LIDOCAINE 2% (20 MG/ML) 5 ML SYRINGE
INTRAMUSCULAR | Status: DC | PRN
  Administered 2022-04-01: 60 mg via INTRAVENOUS

## 2022-04-01 MED ORDER — ONDANSETRON HCL 4 MG/2ML IJ SOLN: 4.0000 mg | Freq: Once | INTRAMUSCULAR | Status: DC | PRN

## 2022-04-01 MED ORDER — PHENYLEPHRINE 80 MCG/ML (10ML) SYRINGE FOR IV PUSH (FOR BLOOD PRESSURE SUPPORT)
PREFILLED_SYRINGE | INTRAVENOUS | Status: AC
  Filled 2022-04-01: qty 10

## 2022-04-01 MED ORDER — PROPOFOL 10 MG/ML IV BOLUS
INTRAVENOUS | Status: AC
  Filled 2022-04-01: qty 20

## 2022-04-01 MED ORDER — SCOPOLAMINE 1 MG/3DAYS TD PT72: 1.0000 | MEDICATED_PATCH | TRANSDERMAL | Status: DC

## 2022-04-01 MED ORDER — ONDANSETRON HCL 4 MG/2ML IJ SOLN
4.0000 mg | Freq: Four times a day (QID) | INTRAMUSCULAR | Status: DC | PRN
  Administered 2022-04-01 – 2022-04-04 (×3): 4 mg via INTRAVENOUS
  Filled 2022-04-01 (×4): qty 2

## 2022-04-01 MED ORDER — SUGAMMADEX SODIUM 200 MG/2ML IV SOLN
INTRAVENOUS | Status: DC | PRN
  Administered 2022-04-01: 200 mg via INTRAVENOUS

## 2022-04-01 MED ORDER — BUPIVACAINE HCL (PF) 0.25 % IJ SOLN
INTRAMUSCULAR | Status: DC | PRN
  Administered 2022-04-01 (×3): 10 mL

## 2022-04-01 MED ORDER — LACTATED RINGERS IV SOLN: INTRAVENOUS | Status: DC | PRN

## 2022-04-01 MED ORDER — SUFENTANIL CITRATE 50 MCG/ML IV SOLN
INTRAVENOUS | Status: DC | PRN
  Administered 2022-04-01 (×2): 10 ug via INTRAVENOUS

## 2022-04-01 MED ORDER — OXYCODONE HCL 5 MG/5ML PO SOLN
ORAL | Status: AC
  Filled 2022-04-01: qty 5

## 2022-04-01 MED ORDER — BUPIVACAINE HCL (PF) 0.25 % IJ SOLN
INTRAMUSCULAR | Status: AC
  Filled 2022-04-01: qty 30

## 2022-04-01 MED ORDER — KETAMINE HCL 10 MG/ML IJ SOLN
INTRAMUSCULAR | Status: DC | PRN
  Administered 2022-04-01 (×5): 10 mg via INTRAVENOUS

## 2022-04-01 MED ORDER — GLYCOPYRROLATE PF 0.2 MG/ML IJ SOSY
PREFILLED_SYRINGE | INTRAMUSCULAR | Status: AC
  Filled 2022-04-01: qty 1

## 2022-04-01 MED ORDER — OXYCODONE HCL 5 MG/5ML PO SOLN
5.0000 mg | Freq: Once | ORAL | Status: AC | PRN
  Administered 2022-04-01: 5 mg via ORAL

## 2022-04-01 MED ORDER — KETOROLAC TROMETHAMINE 30 MG/ML IJ SOLN
30.0000 mg | Freq: Three times a day (TID) | INTRAMUSCULAR | Status: AC
  Administered 2022-04-01 – 2022-04-02 (×3): 30 mg via INTRAVENOUS
  Filled 2022-04-01 (×3): qty 1

## 2022-04-01 MED ORDER — ACETAMINOPHEN 500 MG PO TABS
1000.0000 mg | ORAL_TABLET | ORAL | Status: AC
  Administered 2022-04-01: 1000 mg via ORAL
  Filled 2022-04-01: qty 2

## 2022-04-01 MED ORDER — 0.9 % SODIUM CHLORIDE (POUR BTL) OPTIME
TOPICAL | Status: DC | PRN
  Administered 2022-04-01: 500 mL
  Administered 2022-04-01: 1000 mL

## 2022-04-01 MED ORDER — KETAMINE HCL 50 MG/5ML IJ SOSY
PREFILLED_SYRINGE | INTRAMUSCULAR | Status: AC
  Filled 2022-04-01: qty 5

## 2022-04-01 MED ORDER — ORAL CARE MOUTH RINSE: 15.0000 mL | Freq: Once | OROMUCOSAL | Status: AC

## 2022-04-01 MED ORDER — AMISULPRIDE (ANTIEMETIC) 5 MG/2ML IV SOLN: 10.0000 mg | Freq: Once | INTRAVENOUS | Status: DC | PRN

## 2022-04-01 MED ORDER — LIDOCAINE 2% (20 MG/ML) 5 ML SYRINGE
INTRAMUSCULAR | Status: AC
  Filled 2022-04-01: qty 5

## 2022-04-01 MED ORDER — CELECOXIB 200 MG PO CAPS
200.0000 mg | ORAL_CAPSULE | ORAL | Status: AC
  Administered 2022-04-01: 200 mg via ORAL
  Filled 2022-04-01: qty 1

## 2022-04-01 MED ORDER — HYDROMORPHONE HCL 1 MG/ML IJ SOLN
0.5000 mg | INTRAMUSCULAR | Status: DC | PRN
  Administered 2022-04-01 – 2022-04-02 (×4): 0.5 mg via INTRAVENOUS
  Filled 2022-04-01 (×4): qty 0.5

## 2022-04-01 MED ORDER — GLYCOPYRROLATE 0.2 MG/ML IJ SOLN
INTRAMUSCULAR | Status: DC | PRN
  Administered 2022-04-01: .2 mg via INTRAVENOUS

## 2022-04-01 MED ORDER — HYDROMORPHONE HCL 1 MG/ML IJ SOLN: 0.2500 mg | INTRAMUSCULAR | Status: DC | PRN

## 2022-04-01 MED ORDER — ONDANSETRON 4 MG PO TBDP: 4.0000 mg | ORAL_TABLET | Freq: Four times a day (QID) | ORAL | Status: DC | PRN

## 2022-04-01 MED ORDER — ROCURONIUM 10MG/ML (10ML) SYRINGE FOR MEDFUSION PUMP - OPTIME
INTRAVENOUS | Status: DC | PRN
  Administered 2022-04-01 (×2): 100 mg via INTRAVENOUS

## 2022-04-01 MED ORDER — MIDAZOLAM HCL 2 MG/2ML IJ SOLN
INTRAMUSCULAR | Status: AC
  Filled 2022-04-01: qty 2

## 2022-04-01 MED ORDER — METHOCARBAMOL 500 MG PO TABS
500.0000 mg | ORAL_TABLET | Freq: Four times a day (QID) | ORAL | Status: DC | PRN
  Administered 2022-04-02: 500 mg via ORAL
  Filled 2022-04-01: qty 1

## 2022-04-01 MED ORDER — HYDROCORTISONE SOD SUC (PF) 100 MG IJ SOLR
100.0000 mg | Freq: Once | INTRAMUSCULAR | Status: AC
  Administered 2022-04-01: 100 mg via INTRAVENOUS
  Filled 2022-04-01: qty 2

## 2022-04-01 MED ORDER — BISOPROLOL FUMARATE 5 MG PO TABS
10.0000 mg | ORAL_TABLET | Freq: Every day | ORAL | Status: DC
  Administered 2022-04-02 – 2022-04-04 (×2): 10 mg via ORAL
  Filled 2022-04-01 (×2): qty 2

## 2022-04-01 MED ORDER — HYDROCORTISONE 5 MG PO TABS
5.0000 mg | ORAL_TABLET | Freq: Every morning | ORAL | Status: DC
  Administered 2022-04-02 – 2022-04-04 (×3): 5 mg via ORAL
  Filled 2022-04-01 (×6): qty 1

## 2022-04-01 MED ORDER — CHLORHEXIDINE GLUCONATE 0.12 % MT SOLN
15.0000 mL | Freq: Once | OROMUCOSAL | Status: AC
  Administered 2022-04-01: 15 mL via OROMUCOSAL
  Filled 2022-04-01: qty 15

## 2022-04-01 MED ORDER — ONDANSETRON HCL 4 MG/2ML IJ SOLN
INTRAMUSCULAR | Status: AC
  Filled 2022-04-01: qty 2

## 2022-04-01 MED ORDER — ENOXAPARIN SODIUM 40 MG/0.4ML IJ SOSY
40.0000 mg | PREFILLED_SYRINGE | INTRAMUSCULAR | Status: DC
  Administered 2022-04-02 – 2022-04-04 (×3): 40 mg via SUBCUTANEOUS
  Filled 2022-04-01 (×3): qty 0.4

## 2022-04-01 SURGICAL SUPPLY — 91 items
ADH SKN CLS APL DERMABOND .7 (GAUZE/BANDAGES/DRESSINGS) ×9
ALLOGRAFT PERF 16X20 1.6+/-0.4 (Tissue) IMPLANT
APL PRP STRL LF DISP 70% ISPRP (MISCELLANEOUS) ×6
APPLIER CLIP 9.375 MED OPEN (MISCELLANEOUS)
APR CLP MED 9.3 20 MLT OPN (MISCELLANEOUS)
BAG COUNTER SPONGE SURGICOUNT (BAG) ×4 IMPLANT
BAG DECANTER FOR FLEXI CONT (MISCELLANEOUS) ×4 IMPLANT
BAG SPNG CNTER NS LX DISP (BAG) ×3
BINDER BREAST LRG (GAUZE/BANDAGES/DRESSINGS) IMPLANT
BINDER BREAST XLRG (GAUZE/BANDAGES/DRESSINGS) IMPLANT
BINDER BREAST XXLRG (GAUZE/BANDAGES/DRESSINGS) IMPLANT
BLADE SURG 10 STRL SS (BLADE) ×4 IMPLANT
BLADE SURG 15 STRL LF DISP TIS (BLADE) IMPLANT
BLADE SURG 15 STRL SS (BLADE)
BNDG COHESIVE 4X5 TAN STRL (GAUZE/BANDAGES/DRESSINGS) IMPLANT
CANISTER SUCT 3000ML PPV (MISCELLANEOUS) ×4 IMPLANT
CHLORAPREP W/TINT 26 (MISCELLANEOUS) ×4 IMPLANT
CLIP APPLIE 9.375 MED OPEN (MISCELLANEOUS) ×4 IMPLANT
CNTNR URN SCR LID CUP LEK RST (MISCELLANEOUS) IMPLANT
CONT SPEC 4OZ STRL OR WHT (MISCELLANEOUS) ×3
COVER MAYO STAND STRL (DRAPES) ×4 IMPLANT
COVER SURGICAL LIGHT HANDLE (MISCELLANEOUS) ×4 IMPLANT
DERMABOND ADVANCED .7 DNX12 (GAUZE/BANDAGES/DRESSINGS) ×8 IMPLANT
DRAIN CHANNEL 15F RND FF W/TCR (WOUND CARE) ×4 IMPLANT
DRAIN CHANNEL 19F RND (DRAIN) ×4 IMPLANT
DRAPE HALF SHEET 40X57 (DRAPES) ×8 IMPLANT
DRAPE INCISE 23X17 IOBAN STRL (DRAPES)
DRAPE INCISE 23X17 STRL (DRAPES) IMPLANT
DRAPE INCISE IOBAN 23X17 STRL (DRAPES) IMPLANT
DRAPE INCISE IOBAN 85X60 (DRAPES) ×4 IMPLANT
DRAPE TOP ARMCOVERS (MISCELLANEOUS) ×8 IMPLANT
DRAPE U-SHAPE 76X120 STRL (DRAPES) ×8 IMPLANT
DRAPE UTILITY XL STRL (DRAPES) ×4 IMPLANT
DRAPE WARM FLUID 44X44 (DRAPES) ×4 IMPLANT
DRSG TEGADERM 4X4.75 (GAUZE/BANDAGES/DRESSINGS) IMPLANT
ELECT BLADE 4.0 EZ CLEAN MEGAD (MISCELLANEOUS) ×3
ELECT BLADE 6.5 EXT (BLADE) ×4 IMPLANT
ELECT COATED BLADE 2.86 ST (ELECTRODE) ×4 IMPLANT
ELECT REM PT RETURN 9FT ADLT (ELECTROSURGICAL) ×3
ELECTRODE BLDE 4.0 EZ CLN MEGD (MISCELLANEOUS) ×4 IMPLANT
ELECTRODE REM PT RTRN 9FT ADLT (ELECTROSURGICAL) ×4 IMPLANT
EVACUATOR SILICONE 100CC (DRAIN) ×8 IMPLANT
EXPANDER TISSUE FV FOURTE 400 (Prosthesis & Implant Plastic) IMPLANT
EXPANDER TISSUE MX FOURTE 400 (Prosthesis & Implant Plastic) IMPLANT
GAUZE PAD ABD 8X10 STRL (GAUZE/BANDAGES/DRESSINGS) ×8 IMPLANT
GAUZE SPONGE 4X4 12PLY STRL (GAUZE/BANDAGES/DRESSINGS) ×4 IMPLANT
GAUZE SPONGE 4X4 12PLY STRL LF (GAUZE/BANDAGES/DRESSINGS) IMPLANT
GAUZE XEROFORM 5X9 LF (GAUZE/BANDAGES/DRESSINGS) IMPLANT
GLOVE BIO SURGEON STRL SZ 6 (GLOVE) ×12 IMPLANT
GLOVE SURG SS PI 6.0 STRL IVOR (GLOVE) ×4 IMPLANT
GOWN STRL REUS W/ TWL LRG LVL3 (GOWN DISPOSABLE) ×8 IMPLANT
GOWN STRL REUS W/TWL LRG LVL3 (GOWN DISPOSABLE) ×6
KIT BASIN OR (CUSTOM PROCEDURE TRAY) ×4 IMPLANT
KIT FILL ASEPTIC TRANSFER (MISCELLANEOUS) IMPLANT
KIT TURNOVER KIT B (KITS) ×4 IMPLANT
MARKER SKIN DUAL TIP RULER LAB (MISCELLANEOUS) ×4 IMPLANT
NDL HYPO 25GX1X1/2 BEV (NEEDLE) ×4 IMPLANT
NEEDLE HYPO 25GX1X1/2 BEV (NEEDLE) ×3 IMPLANT
NS IRRIG 1000ML POUR BTL (IV SOLUTION) ×8 IMPLANT
PACK GENERAL/GYN (CUSTOM PROCEDURE TRAY) ×4 IMPLANT
PAD ARMBOARD 7.5X6 YLW CONV (MISCELLANEOUS) ×12 IMPLANT
PENCIL BUTTON HOLSTER BLD 10FT (ELECTRODE) IMPLANT
PIN SAFETY STERILE (MISCELLANEOUS) ×4 IMPLANT
SET COLLECT BLD 21X3/4 12 (NEEDLE) IMPLANT
SOL PREP POV-IOD 4OZ 10% (MISCELLANEOUS) IMPLANT
SPONGE T-LAP 18X18 ~~LOC~~+RFID (SPONGE) IMPLANT
STAPLER VISISTAT 35W (STAPLE) ×4 IMPLANT
STOCKINETTE IMPERVIOUS 9X36 MD (GAUZE/BANDAGES/DRESSINGS) IMPLANT
STRIP CLOSURE SKIN 1/2X4 (GAUZE/BANDAGES/DRESSINGS) ×8 IMPLANT
SUT CHROMIC 4 0 PS 2 18 (SUTURE) IMPLANT
SUT ETHILON 2 0 FS 18 (SUTURE) ×8 IMPLANT
SUT MNCRL AB 4-0 PS2 18 (SUTURE) ×8 IMPLANT
SUT PDS AB 2-0 CT2 27 (SUTURE) ×8 IMPLANT
SUT VIC AB 0 CT2 27 (SUTURE) IMPLANT
SUT VIC AB 3-0 PS2 18 (SUTURE) IMPLANT
SUT VIC AB 3-0 PS2 18XBRD (SUTURE) ×4 IMPLANT
SUT VIC AB 3-0 SH 27 (SUTURE)
SUT VIC AB 3-0 SH 27X BRD (SUTURE) ×8 IMPLANT
SUT VIC AB 4-0 PS2 18 (SUTURE) IMPLANT
SUT VIC AB 4-0 PS2 27 (SUTURE) ×4 IMPLANT
SUT VICRYL 4-0 PS2 18IN ABS (SUTURE) IMPLANT
SUT VLOC 180 0 24IN GS25 (SUTURE) ×4 IMPLANT
SYR 50ML SLIP (SYRINGE) IMPLANT
SYR BULB IRRIG 60ML STRL (SYRINGE) ×4 IMPLANT
SYR CONTROL 10ML LL (SYRINGE) ×4 IMPLANT
TISSUE EXPNDR FV FOURTE 400 (Prosthesis & Implant Plastic) ×3 IMPLANT
TISSUE EXPNDR MX FOURTE 400 (Prosthesis & Implant Plastic) ×3 IMPLANT
TOWEL GREEN STERILE (TOWEL DISPOSABLE) ×4 IMPLANT
TOWEL GREEN STERILE FF (TOWEL DISPOSABLE) ×4 IMPLANT
TRAY FOLEY MTR SLVR 14FR STAT (SET/KITS/TRAYS/PACK) ×4 IMPLANT
TUBE CONNECTING 12X1/4 (SUCTIONS) ×4 IMPLANT

## 2022-04-01 NOTE — Progress Notes (Signed)
Patient vomited, notified the MD for another anti emetic med, as verbal order of phenergan IV 12.5 mg every 6 hr. She told me that her PIV should be change to left arm as she is right arm precaution. IV team consulted due to hard stick.

## 2022-04-01 NOTE — Interval H&P Note (Signed)
History and Physical Interval Note:  04/01/2022 6:51 AM  Amber White  has presented today for surgery, with the diagnosis of history breast cancer, history therapeutic radiation, acquired absence breasts.  The various methods of treatment have been discussed with the patient and family. After consideration of risks, benefits and other options for treatment, the patient has consented to  Procedure(s): BILATERAL BREAST RECONSTRUCTION WITH PLACEMENT OF TISSUE EXPANDERS (Bilateral) RIGHT LATISSIMUS DORSI FLAP (Right) ACELLULAR DERMIS TO LEFT CHEST (Left) as a surgical intervention.  The patient's history has been reviewed, patient examined, no change in status, stable for surgery.  I have reviewed the patient's chart and labs.  Questions were answered to the patient's satisfaction.     Arnoldo Hooker Keeli Roberg

## 2022-04-01 NOTE — Op Note (Signed)
Operative Note   DATE OF OPERATION: 9.19.2  LOCATION: [place] [facility] OR-inpatient  SURGICAL DIVISION: Plastic Surgery  PREOPERATIVE DIAGNOSES:  1. History breast cancer 2. Acquired absence breasts 3. History therapeutic radiation  POSTOPERATIVE DIAGNOSES:  same  PROCEDURE:  1. Bilateral breast reconstruction with tissue expanders 2. Acellular dermis (Alloderm) to left chest 3. Latissimus dorsi flap to right chest  SURGEON: Irene Limbo MD MBA  ASSISTANT: Lonn Georgia RNFA  ANESTHESIA:  General.   EBL: 50 ml  COMPLICATIONS: None immediate.   INDICATIONS FOR PROCEDURE:  The patient, Amber White, is a 46 y.o. female born on Apr 08, 1976, is here for delayed breast reconstruction following bilateral mastectomies and adjuvant right chest radiation.   FINDINGS: Right chest with small seroma cavity noted. RIGHT chest Natrelle 133S-MX-12 T 400 ml tissue expander placed initial fill volume 150 ml saline SN [id number] LEFT chest 133S-FV-12-T 400 ml tissue expander placed initial fill volume 180 ml saline SN [id number]  DESCRIPTION OF PROCEDURE:  The patient's operative site was marked with the patient in the preoperative area. SQ Heparin administered. The patient was taken to the operating room. SCDs were placed and IV antibiotics were given. Foley catheter placed. Patient placed into left lateral position.The patient's operative site was prepped and draped in a sterile fashion. A time out was performed and all information was confirmed to be correct. Incision made in right chest mastectomy scar. Skin flaps elevated over pectoralis muscle. Dissection completed toward right axilla. Seroma cavity present and capsule partially excised. Elliptical skin paddle designed right back and incision made around skin paddle. Skin and superficial fascia elevated off surface of latissimus muscle and subcutaneous tunnel dissected to axilla joining anterior breast cavity. Anterior border of latissimus identified and elevated.  Muscle divided inferiorly at superior iliac spine. Submuscular dissection completed toward midline back and toward origin. Flap rotated into anterior chest cavity. Back irrigated and hemostasis ensured. 15 Fr drain placed and secured with 2-0 nylon. 2-0 PDS used to placed quilting sutures from elevated skin flaps to chest wall. Local anesthetic infiltrated. Incision closed with 0 V lock suture in superficial fascia and dermis. Skin closure completed with 4-0 monocryl subcuticular and tissue glue applied.   Patient repositioned into supine position. Patient re prepped and draped. Additional time out performed.    Right chest cavity was irrigated with saline solution containing Betadine. Hemostasis was ensured. Thoracodorsal nerve was identified and divided. The latissimus muscle was oriented and sutured to pectoralis muscle and chest wall with 2-0 PDS suture in figure of eight fashion. A 19 Fr drain was placed in breast cavity and secured to skin with 2-0 nylon. The tissue expander was prepared and placed beneath latissimus muscle. The inferior latissimus was secured to superficial fascia and chest wall with 2-0 PDS interrupted. Skin paddle inset to mastectomy flap with interrupted 3-0 and 4-0 vicryl in dermis. Skin closure completed with 4-0 monocryl subcuticular, and tissue adhesive applied. The expander port was identified and accessed, total 150 ml saline infiltrated.    Over left chest, incision made in prior mastectomy scar. Skin flaps elevated off pectoralis muscle to dimensions of tissue expander. The left chest cavity was irrigated with saline solution containing Betadine. Hemostasis was ensured. A 19 Fr drain was placed in subcutaneous position laterally and a 15 Fr drain placed along inframammary fold. Each secured to skin with 2-0 nylon. The tissue expander was prepared on back table prior in insertion. The expander was filled with air. Perforated acellular dermis was  draped over anterior  surface  expander. The ADM was then secured to itself over posterior surface of expander with 4-0 chromic. Redundant folds acellular dermis excised so that the ADM lay flat without folds over air filled expander. Following completion of this, air removed from expander. The expander was secured to medial insertion pectoralis with a 2-0 PDS. The lateral and superior tabs were also secured to pectoralis muscle with 2-0 PDSl. The ADM was secured to pectoralis muscle and chest wall at desired inframammary fold with 0 V lock. Skin closure completed with 3-0 vicryl in fascial layer and 4-0 vicryl in dermis. Skin closure completed with 4-0 monocryl subcuticular and tissue adhesive. The expander port was identified and accessed, total 180 ml saline infiltrated.    Tegaderms applied to left chest. Dry dressing applied followed by breast binder. The patient was allowed to wake from anesthesia, extubated and taken to the recovery room in satisfactory condition.   SPECIMENS: none  DRAINS: 15 and 19 Fr JP in left subcutaneous chest, 15 Fr JP in right back, 19 Fr JP in right chest  Irene Limbo, MD [facility] Plastic & Reconstructive Surgery  Office/ physician access line after hours [phone]

## 2022-04-01 NOTE — Transfer of Care (Signed)
Immediate Anesthesia Transfer of Care Note  Patient: Amber White  Procedure(s) Performed: BILATERAL BREAST RECONSTRUCTION WITH PLACEMENT OF TISSUE EXPANDERS (Bilateral: Chest) RIGHT LATISSIMUS DORSI FLAP TO RIGHT CHEST (Right: Chest) ACELLULAR DERMIS TO LEFT CHEST (Left: Chest)  Patient Location: PACU  Anesthesia Type:General  Level of Consciousness: sedated, patient cooperative and responds to stimulation  Airway & Oxygen Therapy: Patient Spontanous Breathing and Patient connected to nasal cannula oxygen  Post-op Assessment: Report given to RN, Post -op Vital signs reviewed and stable and Patient moving all extremities X 4  Post vital signs: Reviewed and stable  Last Vitals:  Vitals Value Taken Time  BP 127/80 04/01/22 1230  Temp    Pulse 71 04/01/22 1236  Resp 19 04/01/22 1236  SpO2 100 % 04/01/22 1236  Vitals shown include unvalidated device data.  Last Pain:  Vitals:   04/01/22 0627  TempSrc:   PainSc: 0-No pain         Complications: No notable events documented.

## 2022-04-01 NOTE — Anesthesia Postprocedure Evaluation (Signed)
Anesthesia Post Note  Patient: Amber White  Procedure(s) Performed: BILATERAL BREAST RECONSTRUCTION WITH PLACEMENT OF TISSUE EXPANDERS (Bilateral: Chest) RIGHT LATISSIMUS DORSI FLAP TO RIGHT CHEST (Right: Chest) ACELLULAR DERMIS TO LEFT CHEST (Left: Chest)     Patient location during evaluation: PACU Anesthesia Type: General Level of consciousness: awake and alert, oriented and patient cooperative Pain management: pain level controlled Vital Signs Assessment: post-procedure vital signs reviewed and stable Respiratory status: spontaneous breathing, nonlabored ventilation and respiratory function stable Cardiovascular status: blood pressure returned to baseline and stable Postop Assessment: no apparent nausea or vomiting Anesthetic complications: no   No notable events documented.  Last Vitals:  Vitals:   04/01/22 1315 04/01/22 1330  BP: (!) 141/87 130/88  Pulse: 65 66  Resp: 17 19  Temp:    SpO2: 100% 100%    Last Pain:  Vitals:   04/01/22 1330  TempSrc:   PainSc: [place]

## 2022-04-01 NOTE — Anesthesia Preprocedure Evaluation (Addendum)
Anesthesia Evaluation  Patient identified by MRN, date of birth, ID band Patient awake    Reviewed: Allergy & Precautions, NPO status , Patient's Chart, lab work & pertinent test results, reviewed documented beta blocker date and time   Airway Mallampati: II  TM Distance: >3 FB Neck ROM: Full    Dental no notable dental hx. (+) Dental Advisory Given   Pulmonary neg pulmonary ROS,    Pulmonary exam normal breath sounds clear to auscultation       Cardiovascular hypertension (127/80 preop), Pt. on medications and Pt. on home beta blockers Normal cardiovascular exam Rhythm:Regular Rate:Normal  Echo 03/28/22: 1. Left ventricular ejection fraction, by estimation, is 55 to 60%. The  left ventricle has normal function. The left ventricle has no regional  wall motion abnormalities. Left ventricular diastolic parameters were  normal. The average left ventricular  global longitudinal strain is -19.1 %. The global longitudinal strain is  normal.  2. Right ventricular systolic function is normal. The right ventricular  size is normal. Tricuspid regurgitation signal is inadequate for assessing  PA pressure.  3. The mitral valve is grossly normal. Trivial mitral valve  regurgitation. No evidence of mitral stenosis.  4. The aortic valve is tricuspid. Aortic valve regurgitation is not  visualized. No aortic stenosis is present.  5. The inferior vena cava is normal in size with greater than 50%  respiratory variability, suggesting right atrial pressure of 3 mmHg.    Neuro/Psych negative neurological ROS  negative psych ROS   GI/Hepatic negative GI ROS, Neg liver ROS,   Endo/Other  Obesity BMI 38 Adrenal insufficiency 2/2 chemo- takes hydrocortisone '5mg'$ , took this AM  Renal/GU negative Renal ROS  negative genitourinary   Musculoskeletal negative musculoskeletal ROS (+)   Abdominal (+) + obese,   Peds  Hematology  (+) Blood  dyscrasia, anemia , Hb 9.8   Anesthesia Other Findings   Reproductive/Obstetrics negative OB ROS                             Anesthesia Physical Anesthesia Plan  ASA: 2  Anesthesia Plan: General   Post-op Pain Management: Tylenol PO (pre-op)*, Dilaudid IV and Ketamine IV*   Induction: Intravenous  PONV Risk Score and Plan: 3 and Ondansetron, Dexamethasone, Midazolam and Treatment may vary due to age or medical condition  Airway Management Planned: Oral ETT  Additional Equipment: None  Intra-op Plan:   Post-operative Plan: Extubation in OR  Informed Consent: I have reviewed the patients History and Physical, chart, labs and discussed the procedure including the risks, benefits and alternatives for the proposed anesthesia with the patient or authorized representative who has indicated his/her understanding and acceptance.     Dental advisory given  Plan Discussed with: CRNA  Anesthesia Plan Comments: (Stress dose steroids for adrenal insufficiency)       Anesthesia Quick Evaluation

## 2022-04-01 NOTE — Anesthesia Procedure Notes (Signed)
Procedure Name: Intubation Date/Time: 04/01/2022 7:27 AM  Performed by: Claris Che, CRNAPre-anesthesia Checklist: Patient identified, Emergency Drugs available, Suction available and Patient being monitored Patient Re-evaluated:Patient Re-evaluated prior to induction Oxygen Delivery Method: Circle system utilized Preoxygenation: Pre-oxygenation with 100% oxygen Induction Type: IV induction and Cricoid Pressure applied Ventilation: Mask ventilation without difficulty Laryngoscope Size: Mac and 4 Grade View: Grade I Tube type: Oral Tube size: 7.5 mm Number of attempts: 1 Airway Equipment and Method: Stylet Placement Confirmation: ETT inserted through vocal cords under direct vision, positive ETCO2 and breath sounds checked- equal and bilateral Secured at: 23 cm Tube secured with: Tape Dental Injury: Teeth and Oropharynx as per pre-operative assessment

## 2022-04-02 ENCOUNTER — Ambulatory Visit: Payer: No Typology Code available for payment source | Admitting: Physician Assistant

## 2022-04-02 ENCOUNTER — Encounter (HOSPITAL_COMMUNITY): Payer: Self-pay | Admitting: Plastic Surgery

## 2022-04-02 MED ORDER — DIPHENHYDRAMINE HCL 50 MG/ML IJ SOLN
25.0000 mg | Freq: Once | INTRAMUSCULAR | Status: AC
  Administered 2022-04-02: 25 mg via INTRAVENOUS
  Filled 2022-04-02: qty 1

## 2022-04-02 MED ORDER — KCL IN DEXTROSE-NACL 20-5-0.45 MEQ/L-%-% IV SOLN
INTRAVENOUS | Status: DC
  Filled 2022-04-02: qty 1000

## 2022-04-02 MED ORDER — CEFAZOLIN SODIUM-DEXTROSE 1-4 GM/50ML-% IV SOLN
1.0000 g | Freq: Three times a day (TID) | INTRAVENOUS | Status: AC
  Administered 2022-04-02 – 2022-04-03 (×3): 1 g via INTRAVENOUS
  Filled 2022-04-02 (×3): qty 50

## 2022-04-02 NOTE — Progress Notes (Addendum)
She did not pee yet since last night. She said she don't feel like her bladder is full. Bladder scan done only 100 ml and bladder is not tender. Tried to go to [facility] but still she did not pee.  Tried to call Dr. Arnoldo Hooker but the clinic still close

## 2022-04-02 NOTE — Progress Notes (Addendum)
Plastic Surgery POD#1 bilateral breast reconstruction with tissue expander acellular dermis left chest right latissimus dorsi flap  Temp:  [97.4 F (36.3 C)-99.6 F (37.6 C)] 99.6 F (37.6 C) (09/20 0452) Pulse Rate:  [64-92] 85 (09/20 0452) Resp:  [15-19] 17 (09/20 0452) BP: (104-144)/(68-95) 104/68 (09/20 0452) SpO2:  [98 %-100 %] 100 % (09/20 0452)   No PO recorded JPs L 40/95 R chest 65 right back 20  Emesis overnight, nausea resolved. Has not eaten.   PE: bilateral chest soft incisions intact tegaderms in place left chest Right back incision dry intact Right chest LD flap skin paddle with ecchymoses/ischemic changes few mm at inset otherwise soft viable  A/P OOB, try diet this am If able to tolerate diet and ambulatory possible d/c this pm Patient has hidradenitis and reports history CHG topical wash and oral antibiotics- will plan resume these as OP. States she has tolerated Ancef/Keflex in past Patient also needs to void- per report bladder scan < 100 ml.  Irene Limbo, MD [facility] Plastic & Reconstructive Surgery  Office/ physician access line after hours [phone]

## 2022-04-02 NOTE — Progress Notes (Addendum)
Previous plan for DC home today but pt reports post-op pain not very well managed with PO pain meds. Dr Iran Planas notified, discharge held at this time.

## 2022-04-02 NOTE — Progress Notes (Signed)
Pt report of RUE itching and swelling. Redness and swelling  noted to R anterior bicep and AC. Pt previously received Vancomycin via R AC IV. IV has since been Dc'd. Pt denies any pain to RUE, just itching. Ice applied to area. Instructed pt to keep extremity elevated. Dr Iran Planas paged; order for IV Benadryl.

## 2022-04-02 NOTE — Discharge Summary (Signed)
Physician Discharge Summary  Patient ID: Amber White MRN: [id number] DOB/AGE: [birth year] 46 y.o.  Admit date: 04/01/2022 Discharge date: 04/02/2022  Admission Diagnoses: History breast cancer, acquired absence breasts, history therapeutic radiation  Discharge Diagnoses:  same  Discharged Condition: stable  Hospital Course: Post operatively patient and nausea and emesis. This resolved am POD#1 and patient able to tolerate diet following this. Patient ambulatory with minimal assist. Reviewed drain care and bathing.   Treatments: surgery: 9.19.23, Bilateral breast reconstruction with tissue expanders, Acellular dermis to left chest, Latissimus dorsi flap to right chest  Discharge Exam: Blood pressure 100/70, pulse 65, temperature 98.3 F (36.8 C), temperature source Oral, resp. rate 18, height '5\' 4"'$  (1.626 m), weight 99.3 kg, last menstrual period 10/26/2020, SpO2 100 %. Incision/Wound: bilateral chest soft incisions intact tegaderms in place left chest, Right back incision dry intact, Right chest LD flap skin paddle with ecchymoses/ischemic changes few mm at inset otherwise soft viable, drains serosanguinous  Disposition: Discharge disposition: 01-Home or Self Care       Discharge Instructions     Call MD for:  redness, tenderness, or signs of infection (pain, swelling, bleeding, redness, odor or green/yellow discharge around incision site)   Complete by: As directed    Call MD for:  temperature >100.5   Complete by: As directed    Discharge instructions   Complete by: As directed    Ok to remove dressings and shower am 9.21.23. Soap and water ok, pat incisions dry. Do not remove left chest Tegaderms. No creams or ointments over incisions. Do not let drains dangle in shower, attach to lanyard or similar.Strip and record drains twice daily and bring log to clinic visit.  Breast binder or soft compression bra all other times.  Ok to raise arms above shoulders for bathing and  dressing.  No house yard work or exercise until cleared by MD.   Patient received all Rx preop. Recommend ibuprofen with meals to aid with pain control. Also ok to use Tylenol for pain. Recommend Miralax or Dulcolax as needed for constipation.   Driving Restrictions   Complete by: As directed    No driving for 2 weeks then no driving if taking prescription pain medication   Lifting restrictions   Complete by: As directed    No lifting > 5-10 lbs until cleared by MD   Resume previous diet   Complete by: As directed       Allergies as of 04/02/2022       Reactions   Paclitaxel Nausea Only, Other (See Comments)   Complained of chest burning and nausea. Resolved with famotidine, methylprednisolone, and diphenhydramine. Resumed paclitaxel and completed without further incident.    Penicillins Hives        Medication List     TAKE these medications    amphetamine-dextroamphetamine 30 MG tablet Commonly known as: ADDERALL Take 30 mg by mouth daily as needed (focus).   bisoprolol 10 MG tablet Commonly known as: ZEBETA TAKE 1 TABLET BY MOUTH EVERY DAY   ferrous sulfate 325 (65 FE) MG tablet Take 325 mg by mouth daily.   hydrocortisone 5 MG tablet Commonly known as: Cortef Take 2 tablets (10 mg total) by mouth in the morning AND 1 tablet (5 mg total) daily before supper. What changed: See the new instructions.   multivitamin with minerals Tabs tablet Take 1 tablet by mouth daily.        Follow-up Information     Irene Limbo, MD Follow up in  1 week(s).   Specialty: Plastic Surgery Why: as scheduled Contact information: [address] [place] [address] [phone]                 Signed: Irene Limbo 04/02/2022, 12:22 PM

## 2022-04-03 LAB — BASIC METABOLIC PANEL
Anion gap: 5 (ref 5–15)
BUN: 21 mg/dL — ABNORMAL HIGH (ref 6–20)
CO2: 21 mmol/L — ABNORMAL LOW (ref 22–32)
Calcium: 8.2 mg/dL — ABNORMAL LOW (ref 8.9–10.3)
Chloride: 110 mmol/L (ref 98–111)
Creatinine, Ser: 1.67 mg/dL — ABNORMAL HIGH (ref 0.44–1.00)
GFR, Estimated: 38 mL/min — ABNORMAL LOW (ref >60)
Glucose, Bld: 93 mg/dL (ref 70–99)
Potassium: 3.8 mmol/L (ref 3.5–5.1)
Sodium: 136 mmol/L (ref 135–145)

## 2022-04-03 LAB — CBC
HCT: 27 % — ABNORMAL LOW (ref 36.0–46.0)
Hemoglobin: 8.9 g/dL — ABNORMAL LOW (ref 12.0–15.0)
MCH: 29.5 pg (ref 26.0–34.0)
MCHC: 33 g/dL (ref 30.0–36.0)
MCV: 89.4 fL (ref 80.0–100.0)
Platelets: 292 10*3/uL (ref 150–400)
RBC: 3.02 MIL/uL — ABNORMAL LOW (ref 3.87–5.11)
RDW: 13.8 % (ref 11.5–15.5)
WBC: 5.2 10*3/uL (ref 4.0–10.5)
nRBC: 0 % (ref 0.0–0.2)

## 2022-04-03 MED ORDER — SODIUM CHLORIDE 0.9 % IV SOLN: INTRAVENOUS | Status: DC

## 2022-04-03 MED ORDER — CEFAZOLIN SODIUM-DEXTROSE 1-4 GM/50ML-% IV SOLN
1.0000 g | Freq: Three times a day (TID) | INTRAVENOUS | Status: DC
  Administered 2022-04-03 – 2022-04-04 (×3): 1 g via INTRAVENOUS
  Filled 2022-04-03 (×6): qty 50

## 2022-04-03 NOTE — Discharge Summary (Deleted)
Physician Discharge Summary  Patient ID: Amber White MRN: [id number] DOB/AGE: 11-24-1975 46 y.o.  Admit date: 04/01/2022 Discharge date: 04/03/2022  Admission Diagnoses: History breast cancer acquired absence breasts history therapeutic radiation  Discharge Diagnoses:  same  Discharged Condition: stable  Hospital Course: Post operatively patient had emesis and nausea. This resolved on POD#1. Patient required IV pain medication and discharge held on POD#1. Pain control had improved on POD#2. Exam of skin paddle flap on POD#2 with further ischemic changes skin and epidermolysis. Discussed with patient may need debridement or removal of flap. Will continue follow up as outpatient and make this decision.   Treatments: surgery: bilateral breaset reconstruction with tissue expander acellular dermis to left chest right latissimus dorsi flap 9.19.23  Discharge Exam: Blood pressure (!) 88/62, pulse 85, temperature 99.5 F (37.5 C), temperature source Oral, resp. rate 16, height '5\' 4"'$  (1.626 m), weight 99.3 kg, last menstrual period 10/26/2020, SpO2 100 %. Incision/Wound: left chest benign soft incisions intact Tegaderms in place right chest skin paddle of flap with epidermolysis back flat drains serosanguinous  Disposition: Discharge disposition: 01-Home or Self Care       Discharge Instructions     Call MD for:  redness, tenderness, or signs of infection (pain, swelling, bleeding, redness, odor or green/yellow discharge around incision site)   Complete by: As directed    Call MD for:  temperature >100.5   Complete by: As directed    Discharge instructions   Complete by: As directed    Ok to remove dressings and shower am 9.21.23. Soap and water ok, pat incisions dry. Do not remove left chest Tegaderms. No creams or ointments over incisions. Do not let drains dangle in shower, attach to lanyard or similar.Strip and record drains twice daily and bring log to clinic visit.  Breast binder  or soft compression bra all other times.  Ok to raise arms above shoulders for bathing and dressing.  No house yard work or exercise until cleared by MD.   Patient received all Rx preop. Recommend ibuprofen with meals to aid with pain control. Also ok to use Tylenol for pain. Recommend Miralax or Dulcolax as needed for constipation.   Driving Restrictions   Complete by: As directed    No driving for 2 weeks then no driving if taking prescription pain medication   Lifting restrictions   Complete by: As directed    No lifting > 5-10 lbs until cleared by MD   Resume previous diet   Complete by: As directed       Allergies as of 04/03/2022       Reactions   Paclitaxel Nausea Only, Other (See Comments)   Complained of chest burning and nausea. Resolved with famotidine, methylprednisolone, and diphenhydramine. Resumed paclitaxel and completed without further incident.    Penicillins Hives        Medication List     TAKE these medications    amphetamine-dextroamphetamine 30 MG tablet Commonly known as: ADDERALL Take 30 mg by mouth daily as needed (focus).   bisoprolol 10 MG tablet Commonly known as: ZEBETA TAKE 1 TABLET BY MOUTH EVERY DAY   ferrous sulfate 325 (65 FE) MG tablet Take 325 mg by mouth daily.   hydrocortisone 5 MG tablet Commonly known as: Cortef Take 2 tablets (10 mg total) by mouth in the morning AND 1 tablet (5 mg total) daily before supper. What changed: See the new instructions.   multivitamin with minerals Tabs tablet Take 1 tablet by mouth daily.  Follow-up Information     Irene Limbo, MD Follow up in 1 week(s).   Specialty: Plastic Surgery Why: as scheduled Contact information: [address] [address] [phone]                 Signed: Irene Limbo 04/03/2022, 7:45 AM

## 2022-04-03 NOTE — Progress Notes (Addendum)
Plastic Surgery POD#2 bilateral breast reconstruction with tissue expander acellular dermis left chest right latissimus dorsi flap  Temp:  [97.8 F (36.6 C)-99.6 F (37.6 C)] 98.7 F (37.1 C) (09/21 1556) Pulse Rate:  [77-88] 84 (09/21 1556) Resp:  [14-18] 17 (09/21 1556) BP: (85-112)/(53-68) 112/53 (09/21 1556) SpO2:  [97 %-100 %] 99 % (09/21 1556)   Patient was scheduled for discharge today and became diaphoretic while OOB. Improved with return to bed. Also had emesis during this episode. Reports difficult to eat as she has had hiccups. IVF restarted.   JPs L 10/10 R chest 20 right back 40 for today  Emesis overnight, nausea resolved. Has not eaten.   PE: Gen: alert oriented sitting and eating CV: normal rate normal heart sounds Pulm: normal breath sounds Chest: left chest soft incisions intact tegaderms in place  Right back incision dry intact Right chest LD flap skin paddle with ischemic changes progressive exam 9.20.22, stable since this am Drains watery serosanguinous  A/P Unclear etiology today's episode. However has developed ischemic changes right chest latissimus flap and given her continued stay in hospital recommend we proceed with operative exploration. Counseled I anticipate all of latissimus skin paddle will need to be excised. If the muscle is still viable can remain in place and close mastectomy flap over this. However if muscle is not viable, would plan removal flap and expander over right. Counseled I do not feel the flap caused today's episode but over time ischemic flap could lead to infection. Plan OR tomorrow.  Reviewed with mother at bedside purpose of completing flap in setting radiation to reduce risks for reconstruction. Unfortunately flap not healthy.   Irene Limbo, MD [facility] Plastic & Reconstructive Surgery  Office/ physician access line after hours [phone]

## 2022-04-03 NOTE — Progress Notes (Signed)
Pt found in bathroom sitting on shower bench after ambulating independently. She was diaphoretic, pale and reporting weakness and lightheadedness. Pt also reported one occurrence of emesis. Vital signs obtained: BP 85/63 MAP 70 and O2 sat 100% on RA/. Pt assisted back to bed via WC. Repeat vitals show slight improvement of BP. Pt remains lying flat in bed, very drowsy, A&O at baseline and easily awakens to voice.   MD notified and IV fluids started; discharge cancelled.

## 2022-04-04 ENCOUNTER — Inpatient Hospital Stay (HOSPITAL_COMMUNITY): Payer: No Typology Code available for payment source | Admitting: Certified Registered Nurse Anesthetist

## 2022-04-04 ENCOUNTER — Other Ambulatory Visit: Payer: Self-pay

## 2022-04-04 ENCOUNTER — Encounter (HOSPITAL_COMMUNITY)
Admission: RE | Disposition: A | Discharge status: Home / Self Care | Payer: Self-pay | Source: Ambulatory Visit | Attending: Plastic Surgery

## 2022-04-04 ENCOUNTER — Encounter (HOSPITAL_COMMUNITY): Payer: Self-pay | Admitting: Plastic Surgery

## 2022-04-04 DIAGNOSIS — N641 Fat necrosis of breast: Secondary | ICD-10-CM | POA: Diagnosis not present

## 2022-04-04 DIAGNOSIS — Z923 Personal history of irradiation: Secondary | ICD-10-CM | POA: Diagnosis not present

## 2022-04-04 DIAGNOSIS — Z853 Personal history of malignant neoplasm of breast: Secondary | ICD-10-CM

## 2022-04-04 DIAGNOSIS — Z9013 Acquired absence of bilateral breasts and nipples: Secondary | ICD-10-CM

## 2022-04-04 HISTORY — PX: INCISION AND DRAINAGE OF WOUND: SHX1803

## 2022-04-04 SURGERY — IRRIGATION AND DEBRIDEMENT WOUND
Anesthesia: General | Laterality: Right

## 2022-04-04 MED ORDER — STERILE WATER FOR IRRIGATION IR SOLN
Status: DC | PRN
  Administered 2022-04-04: 1000 mL

## 2022-04-04 MED ORDER — SODIUM CHLORIDE 0.9 % IV SOLN: INTRAVENOUS | Status: DC

## 2022-04-04 MED ORDER — EPHEDRINE SULFATE-NACL 50-0.9 MG/10ML-% IV SOSY
PREFILLED_SYRINGE | INTRAVENOUS | Status: DC | PRN
  Administered 2022-04-04 (×2): 5 mg via INTRAVENOUS

## 2022-04-04 MED ORDER — ONDANSETRON HCL 4 MG/2ML IJ SOLN
INTRAMUSCULAR | Status: DC | PRN
  Administered 2022-04-04: 4 mg via INTRAVENOUS

## 2022-04-04 MED ORDER — ORAL CARE MOUTH RINSE: 15.0000 mL | Freq: Once | OROMUCOSAL | Status: AC

## 2022-04-04 MED ORDER — ALBUMIN HUMAN 5 % IV SOLN: INTRAVENOUS | Status: DC | PRN

## 2022-04-04 MED ORDER — LACTATED RINGERS IV SOLN: INTRAVENOUS | Status: DC

## 2022-04-04 MED ORDER — HYDROMORPHONE HCL 1 MG/ML IJ SOLN: 0.2500 mg | INTRAMUSCULAR | Status: DC | PRN

## 2022-04-04 MED ORDER — HYDROMORPHONE HCL 1 MG/ML IJ SOLN
0.5000 mg | INTRAMUSCULAR | Status: DC | PRN
  Administered 2022-04-04: 0.5 mg via INTRAVENOUS
  Filled 2022-04-04: qty 0.5

## 2022-04-04 MED ORDER — PHENYLEPHRINE HCL-NACL 20-0.9 MG/250ML-% IV SOLN
INTRAVENOUS | Status: DC | PRN
  Administered 2022-04-04: 30 ug/min via INTRAVENOUS

## 2022-04-04 MED ORDER — PHENYLEPHRINE 80 MCG/ML (10ML) SYRINGE FOR IV PUSH (FOR BLOOD PRESSURE SUPPORT)
PREFILLED_SYRINGE | INTRAVENOUS | Status: AC
  Filled 2022-04-04: qty 10

## 2022-04-04 MED ORDER — DEXAMETHASONE SODIUM PHOSPHATE 10 MG/ML IJ SOLN
INTRAMUSCULAR | Status: DC | PRN
  Administered 2022-04-04: 10 mg via INTRAVENOUS

## 2022-04-04 MED ORDER — METHYLPREDNISOLONE SODIUM SUCC 125 MG IJ SOLR: 40.0000 mg | Freq: Once | INTRAMUSCULAR | Status: DC

## 2022-04-04 MED ORDER — ONDANSETRON HCL 4 MG/2ML IJ SOLN: 4.0000 mg | Freq: Once | INTRAMUSCULAR | Status: DC | PRN

## 2022-04-04 MED ORDER — CHLORHEXIDINE GLUCONATE 0.12 % MT SOLN: 15.0000 mL | Freq: Once | OROMUCOSAL | Status: AC

## 2022-04-04 MED ORDER — PROPOFOL 10 MG/ML IV BOLUS
INTRAVENOUS | Status: DC | PRN
  Administered 2022-04-04: 150 mg via INTRAVENOUS

## 2022-04-04 MED ORDER — CIPROFLOXACIN IN D5W 400 MG/200ML IV SOLN
INTRAVENOUS | Status: AC
  Filled 2022-04-04: qty 200

## 2022-04-04 MED ORDER — CIPROFLOXACIN IN D5W 400 MG/200ML IV SOLN
400.0000 mg | Freq: Once | INTRAVENOUS | Status: AC
  Administered 2022-04-04: 400 mg via INTRAVENOUS

## 2022-04-04 MED ORDER — OXYCODONE HCL 5 MG/5ML PO SOLN: 5.0000 mg | Freq: Once | ORAL | Status: DC | PRN

## 2022-04-04 MED ORDER — CHLORHEXIDINE GLUCONATE 0.12 % MT SOLN
OROMUCOSAL | Status: AC
  Administered 2022-04-04: 15 mL via OROMUCOSAL
  Filled 2022-04-04: qty 15

## 2022-04-04 MED ORDER — AMISULPRIDE (ANTIEMETIC) 5 MG/2ML IV SOLN: 10.0000 mg | Freq: Once | INTRAVENOUS | Status: DC | PRN

## 2022-04-04 MED ORDER — PHENYLEPHRINE 80 MCG/ML (10ML) SYRINGE FOR IV PUSH (FOR BLOOD PRESSURE SUPPORT)
PREFILLED_SYRINGE | INTRAVENOUS | Status: DC | PRN
  Administered 2022-04-04 (×2): 80 ug via INTRAVENOUS
  Administered 2022-04-04: 160 ug via INTRAVENOUS
  Administered 2022-04-04 (×3): 80 ug via INTRAVENOUS

## 2022-04-04 MED ORDER — FENTANYL CITRATE (PF) 250 MCG/5ML IJ SOLN
INTRAMUSCULAR | Status: AC
  Filled 2022-04-04: qty 5

## 2022-04-04 MED ORDER — MIDAZOLAM HCL 2 MG/2ML IJ SOLN
INTRAMUSCULAR | Status: DC | PRN
  Administered 2022-04-04: 2 mg via INTRAVENOUS

## 2022-04-04 MED ORDER — LIDOCAINE 2% (20 MG/ML) 5 ML SYRINGE
INTRAMUSCULAR | Status: DC | PRN
  Administered 2022-04-04: 60 mg via INTRAVENOUS

## 2022-04-04 MED ORDER — 0.9 % SODIUM CHLORIDE (POUR BTL) OPTIME
TOPICAL | Status: DC | PRN
  Administered 2022-04-04: 1000 mL

## 2022-04-04 MED ORDER — FENTANYL CITRATE (PF) 250 MCG/5ML IJ SOLN
INTRAMUSCULAR | Status: DC | PRN
  Administered 2022-04-04 (×3): 25 ug via INTRAVENOUS

## 2022-04-04 MED ORDER — OXYCODONE HCL 5 MG PO TABS: 5.0000 mg | ORAL_TABLET | Freq: Once | ORAL | Status: DC | PRN

## 2022-04-04 MED ORDER — MIDAZOLAM HCL 2 MG/2ML IJ SOLN
INTRAMUSCULAR | Status: AC
  Filled 2022-04-04: qty 2

## 2022-04-04 MED ORDER — ROCURONIUM BROMIDE 10 MG/ML (PF) SYRINGE
PREFILLED_SYRINGE | INTRAVENOUS | Status: AC
  Filled 2022-04-04: qty 10

## 2022-04-04 SURGICAL SUPPLY — 51 items
ADH SKN CLS LQ APL DERMABOND (GAUZE/BANDAGES/DRESSINGS) ×1
APL PRP STRL LF DISP 70% ISPRP (MISCELLANEOUS)
BAG COUNTER SPONGE SURGICOUNT (BAG) ×2 IMPLANT
BAG DECANTER FOR FLEXI CONT (MISCELLANEOUS) IMPLANT
BAG SPNG CNTER NS LX DISP (BAG) ×1
BINDER BREAST XXLRG (GAUZE/BANDAGES/DRESSINGS) IMPLANT
BLADE CLIPPER SURG (BLADE) IMPLANT
BNDG GAUZE DERMACEA FLUFF 4 (GAUZE/BANDAGES/DRESSINGS) IMPLANT
BNDG GZE DERMACEA 4 6PLY (GAUZE/BANDAGES/DRESSINGS)
CANISTER SUCT 3000ML PPV (MISCELLANEOUS) ×2 IMPLANT
CHLORAPREP W/TINT 26 (MISCELLANEOUS) IMPLANT
CNTNR URN SCR LID CUP LEK RST (MISCELLANEOUS) IMPLANT
CONT SPEC 4OZ STRL OR WHT (MISCELLANEOUS)
COVER SURGICAL LIGHT HANDLE (MISCELLANEOUS) ×2 IMPLANT
DERMABOND ADVANCED .7 DNX6 (GAUZE/BANDAGES/DRESSINGS) IMPLANT
DRAPE HALF SHEET 40X57 (DRAPES) IMPLANT
DRAPE IMP U-DRAPE 54X76 (DRAPES) ×2 IMPLANT
DRAPE INCISE IOBAN 66X45 STRL (DRAPES) IMPLANT
DRAPE LAPAROTOMY 100X72 PEDS (DRAPES) ×2 IMPLANT
DRSG ADAPTIC 3X8 NADH LF (GAUZE/BANDAGES/DRESSINGS) IMPLANT
DRSG VAC ATS LRG SENSATRAC (GAUZE/BANDAGES/DRESSINGS) IMPLANT
DRSG VAC ATS MED SENSATRAC (GAUZE/BANDAGES/DRESSINGS) IMPLANT
DRSG VAC ATS SM SENSATRAC (GAUZE/BANDAGES/DRESSINGS) IMPLANT
ELECT CAUTERY BLADE 6.4 (BLADE) ×2 IMPLANT
ELECT COATED BLADE 2.86 ST (ELECTRODE) ×2 IMPLANT
ELECT REM PT RETURN 9FT ADLT (ELECTROSURGICAL) ×1
ELECTRODE REM PT RTRN 9FT ADLT (ELECTROSURGICAL) ×2 IMPLANT
GAUZE PAD ABD 8X10 STRL (GAUZE/BANDAGES/DRESSINGS) ×2 IMPLANT
GAUZE SPONGE 4X4 12PLY STRL (GAUZE/BANDAGES/DRESSINGS) ×2 IMPLANT
GLOVE BIO SURGEON STRL SZ 6 (GLOVE) ×2 IMPLANT
GLOVE SURG SS PI 6.0 STRL IVOR (GLOVE) ×2 IMPLANT
GOWN STRL REUS W/ TWL LRG LVL3 (GOWN DISPOSABLE) ×4 IMPLANT
GOWN STRL REUS W/TWL LRG LVL3 (GOWN DISPOSABLE) ×2
HANDPIECE INTERPULSE COAX TIP (DISPOSABLE)
KIT BASIN OR (CUSTOM PROCEDURE TRAY) ×2 IMPLANT
KIT TURNOVER KIT B (KITS) ×2 IMPLANT
NS IRRIG 1000ML POUR BTL (IV SOLUTION) ×2 IMPLANT
PACK GENERAL/GYN (CUSTOM PROCEDURE TRAY) ×2 IMPLANT
PACK UNIVERSAL I (CUSTOM PROCEDURE TRAY) ×2 IMPLANT
PAD ARMBOARD 7.5X6 YLW CONV (MISCELLANEOUS) ×4 IMPLANT
SET HNDPC FAN SPRY TIP SCT (DISPOSABLE) IMPLANT
STAPLER VISISTAT 35W (STAPLE) IMPLANT
SURGILUBE 2OZ TUBE FLIPTOP (MISCELLANEOUS) IMPLANT
SUT MON AB 4-0 PC3 18 (SUTURE) IMPLANT
SUT VIC AB 3-0 PS2 18 (SUTURE) IMPLANT
SUT VIC AB 4-0 PS2 18 (SUTURE) IMPLANT
SWAB COLLECTION DEVICE MRSA (MISCELLANEOUS) IMPLANT
SWAB CULTURE ESWAB REG 1ML (MISCELLANEOUS) IMPLANT
TOWEL GREEN STERILE (TOWEL DISPOSABLE) ×2 IMPLANT
TOWEL GREEN STERILE FF (TOWEL DISPOSABLE) ×2 IMPLANT
UNDERPAD 30X36 HEAVY ABSORB (UNDERPADS AND DIAPERS) ×2 IMPLANT

## 2022-04-04 NOTE — Op Note (Signed)
Operative Note   DATE OF OPERATION: 9.22.23  LOCATION: [facility]-inpatient  SURGICAL DIVISION: Plastic Surgery  PREOPERATIVE DIAGNOSES:  1. History breast cancer 2. Acquired absence breasts 3. History therapeutic radiation 4. Skin flap necrosis   POSTOPERATIVE DIAGNOSES:  same  PROCEDURE:  Excisional debridement skin and subcutaneous tissue right chest 50 cm2  SURGEON: Irene Limbo MD MBA  ASSISTANT: none  ANESTHESIA:  General.   EBL: 20 ml  COMPLICATIONS: None immediate.   INDICATIONS FOR PROCEDURE:  The patient, Amber White, is a 46 y.o. female born on Apr 19, 1976, is here for debridement right latissimus dorsi flap demonstrating ischemic changes to skin paddle of flap.   FINDINGS: Skin paddle associated with latissimus dorsi flap nonviable. Underlying latissimus muscle viable and inset intact.  DESCRIPTION OF PROCEDURE:  The patient's operative site was marked with the patient in the preoperative area. The patient was taken to the operating room. SCDs were placed and IV antibiotics were given. The patient's operative site was prepped and draped in a sterile fashion. A time out was performed and all information was confirmed to be correct.  Incision made along all borders inset skin paddle of latissimus dorsi flap. Tangential excisional debridement of this completed and complete excision of this and underlying subcutaneous fat completed total 50 cm2. Examination underlying latissimus muscle noted to be viable. Cavity irrigated with saline and saline Betadine solution. Closure skin over latissimus muscle completed with 3-0 and 4-0 vicryl in dermis, 4-0 monocryl subcuticular skin closure. Dermabond, dry dressing and breast binder applied.  The patient was allowed to wake from anesthesia, extubated and taken to the recovery room in satisfactory condition.   SPECIMENS: none  DRAINS: continued presence 19 Fr JP in right subcutanous chest  Irene Limbo, MD [facility] Plastic &  Reconstructive Surgery  Office/ physician access line after hours [phone]

## 2022-04-04 NOTE — Transfer of Care (Signed)
Immediate Anesthesia Transfer of Care Note  Patient: Amber White  Procedure(s) Performed: IRRIGATION AND DEBRIDEMENT OF CHEST , POSSIBLE REMOVAL OF TISSUE EXPANDER (Right)  Patient Location: PACU  Anesthesia Type:General  Level of Consciousness: drowsy and patient cooperative  Airway & Oxygen Therapy: Patient Spontanous Breathing and Patient connected to face mask oxygen  Post-op Assessment: Report given to RN and Post -op Vital signs reviewed and stable  Post vital signs: Reviewed and stable  Last Vitals:  Vitals Value Taken Time  BP 123/82 04/04/22 1605  Temp    Pulse 92 04/04/22 1611  Resp 13 04/04/22 1611  SpO2 100 % 04/04/22 1611  Vitals shown include unvalidated device data.  Last Pain:  Vitals:   04/04/22 1427  TempSrc:   PainSc: 0-No pain      Patients Stated Pain Goal: 0 (00/92/33 0076)  Complications: No notable events documented.

## 2022-04-04 NOTE — Anesthesia Procedure Notes (Signed)
Procedure Name: LMA Insertion Date/Time: 04/04/2022 2:51 PM  Performed by: Janene Harvey, CRNAPre-anesthesia Checklist: Patient identified, Emergency Drugs available, Suction available and Patient being monitored Patient Re-evaluated:Patient Re-evaluated prior to induction Oxygen Delivery Method: Circle system utilized Preoxygenation: Pre-oxygenation with 100% oxygen Induction Type: IV induction LMA: LMA inserted LMA Size: 4.0 Placement Confirmation: positive ETCO2 Dental Injury: Teeth and Oropharynx as per pre-operative assessment

## 2022-04-04 NOTE — Anesthesia Postprocedure Evaluation (Signed)
Anesthesia Post Note  Patient: Amber White  Procedure(s) Performed: IRRIGATION AND DEBRIDEMENT OF CHEST , POSSIBLE REMOVAL OF TISSUE EXPANDER (Right)     Patient location during evaluation: PACU Anesthesia Type: General Level of consciousness: awake and alert and oriented Pain management: pain level controlled Vital Signs Assessment: post-procedure vital signs reviewed and stable Respiratory status: spontaneous breathing, nonlabored ventilation and respiratory function stable Cardiovascular status: blood pressure returned to baseline and stable Postop Assessment: no apparent nausea or vomiting Anesthetic complications: no   No notable events documented.  Last Vitals:  Vitals:   04/04/22 1615 04/04/22 1630  BP: (!) 146/67 (!) 148/70  Pulse: 96 95  Resp: 15 14  Temp:  36.7 C  SpO2: 100% 97%    Last Pain:  Vitals:   04/04/22 1630  TempSrc:   PainSc: Asleep                 Cyrah Mclamb A.

## 2022-04-04 NOTE — Anesthesia Preprocedure Evaluation (Addendum)
Anesthesia Evaluation  Patient identified by MRN, date of birth, ID band Patient awake    Reviewed: Allergy & Precautions, NPO status , Patient's Chart, lab work & pertinent test results, reviewed documented beta blocker date and time   Airway Mallampati: II  TM Distance: >3 FB Neck ROM: Full    Dental no notable dental hx. (+) Dental Advisory Given   Pulmonary neg pulmonary ROS,    Pulmonary exam normal breath sounds clear to auscultation       Cardiovascular hypertension, Pt. on medications and Pt. on home beta blockers Normal cardiovascular exam Rhythm:Regular Rate:Normal  Echo 03/28/22: 1. Left ventricular ejection fraction, by estimation, is 55 to 60%. The  left ventricle has normal function. The left ventricle has no regional  wall motion abnormalities. Left ventricular diastolic parameters were  normal. The average left ventricular  global longitudinal strain is -19.1 %. The global longitudinal strain is  normal.  2. Right ventricular systolic function is normal. The right ventricular  size is normal. Tricuspid regurgitation signal is inadequate for assessing  PA pressure.  3. The mitral valve is grossly normal. Trivial mitral valve  regurgitation. No evidence of mitral stenosis.  4. The aortic valve is tricuspid. Aortic valve regurgitation is not  visualized. No aortic stenosis is present.  5. The inferior vena cava is normal in size with greater than 50%  respiratory variability, suggesting right atrial pressure of 3 mmHg.    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   Renal/GU Renal InsufficiencyRenal disease  negative genitourinary   Musculoskeletal negative musculoskeletal ROS (+)   Abdominal (+) + obese,   Peds  Hematology  (+) Blood dyscrasia,  anemia , Hb 9.8   Anesthesia Other Findings   Reproductive/Obstetrics negative OB ROS                            Anesthesia Physical  Anesthesia Plan  ASA: 2  Anesthesia Plan: General   Post-op Pain Management: Tylenol PO (pre-op)*, Dilaudid IV and Ketamine IV*   Induction: Intravenous  PONV Risk Score and Plan: 3 and Ondansetron, Dexamethasone, Midazolam and Treatment may vary due to age or medical condition  Airway Management Planned: Oral ETT  Additional Equipment: None  Intra-op Plan:   Post-operative Plan: Extubation in OR  Informed Consent: I have reviewed the patients History and Physical, chart, labs and discussed the procedure including the risks, benefits and alternatives for the proposed anesthesia with the patient or authorized representative who has indicated his/her understanding and acceptance.     Dental advisory given  Plan Discussed with: CRNA and Anesthesiologist  Anesthesia Plan Comments: (Stress dose steroids for adrenal insufficiency)        Anesthesia Quick Evaluation

## 2022-04-05 ENCOUNTER — Encounter (HOSPITAL_COMMUNITY): Payer: Self-pay | Admitting: Plastic Surgery

## 2022-04-05 NOTE — Discharge Summary (Signed)
Physician Discharge Summary  Patient ID: Amber White MRN: [id number] DOB/AGE: 48/18/1977 48 y.o.  Admit date: 04/01/2022 Discharge date: 04/05/2022  Admission Diagnoses: History breast cancer acquired absence breasts history therapeutic radiation  Discharge Diagnoses:  same  Discharged Condition: stable  Hospital Course: Post operatively patient had pain requiring IV medication on POD#1. On POD#2 patient had emesis and episode diaphoresis, hypotension. Patient placed on IV fluids and improved. Patient noted during this time to have ischemic changes to skin paddle right chest latissimus flap and returned to OR on POD# 3 for exploration. Skin paddle noted to be not viable but underlying muscle viable. Skin paddle excised and wound closed. Following this patient had no further events and stable for discharge on POD#4. Reviewed resumption of home medications including iron for known chronic anemia post chemotherapy.    Treatments: surgery: bilateral breaset reconstruction with tissue expander acellular dermis to left chest right latissimus dorsi flap 9.19.23; debridement right chest 9.22.23  Discharge Exam: Blood pressure 116/79, pulse 100, temperature 98.8 F (37.1 C), temperature source Oral, resp. rate 16, height '5\' 4"'$  (1.626 m), weight 99.3 kg, last menstrual period 10/26/2020, SpO2 100 %. Incision/Wound: left chest benign soft incisions intact Tegaderms in place right chest incision dry intact back flat drains serosanguinous  Disposition: Discharge disposition: 01-Home or Self Care       Discharge Instructions     Call MD for:  redness, tenderness, or signs of infection (pain, swelling, bleeding, redness, odor or green/yellow discharge around incision site)   Complete by: As directed    Call MD for:  temperature >100.5   Complete by: As directed    Discharge instructions   Complete by: As directed    Ok to remove dressings and shower normally. Soap and water ok, pat incisions  dry. Do not remove left chest Tegaderms. No creams or ointments over incisions. Do not let drains dangle in shower, attach to lanyard or similar.Strip and record drains twice daily and bring log to clinic visit.  Breast binder or soft compression bra all other times.  Ok to raise arms above shoulders for bathing and dressing.  No house yard work or exercise until cleared by MD.   Patient received all Rx preop. Recommend ibuprofen with meals to aid with pain control. Also ok to use Tylenol for pain. Recommend Miralax or Dulcolax as needed for constipation.   Driving Restrictions   Complete by: As directed    No driving for 2 weeks then no driving if taking prescription pain medication   Lifting restrictions   Complete by: As directed    No lifting > 5-10 lbs until cleared by MD   Resume previous diet   Complete by: As directed       Allergies as of 04/05/2022       Reactions   Paclitaxel Nausea Only, Other (See Comments)   Complained of chest burning and nausea. Resolved with famotidine, methylprednisolone, and diphenhydramine. Resumed paclitaxel and completed without further incident.    Penicillins Hives        Medication List     TAKE these medications    amphetamine-dextroamphetamine 30 MG tablet Commonly known as: ADDERALL Take 30 mg by mouth daily as needed (focus).   bisoprolol 10 MG tablet Commonly known as: ZEBETA TAKE 1 TABLET BY MOUTH EVERY DAY   ferrous sulfate 325 (65 FE) MG tablet Take 325 mg by mouth daily.   hydrocortisone 5 MG tablet Commonly known as: Cortef Take 2 tablets (10 mg total)  by mouth in the morning AND 1 tablet (5 mg total) daily before supper. What changed: See the new instructions.   multivitamin with minerals Tabs tablet Take 1 tablet by mouth daily.        Follow-up Information     Irene Limbo, MD Follow up in 1 week(s).   Specialty: Plastic Surgery Why: as scheduled Contact information: [address] [place] [address] [phone]                 Signed: Irene Limbo 04/05/2022, 8:00 AM

## 2022-04-05 NOTE — TOC Transition Note (Signed)
Transition of Care [facility]) - CM/SW Discharge Note   Patient Details  Name: Amber White MRN: [id number] Date of Birth: [birth year]  [facility] CM/SW Contact:  Bartholomew Crews, RN Phone Number: [phone] 04/05/2022, 8:08 AM   Clinical Narrative:     Patient to transition home today. Chart reviewed. No TOC needs identified at this time.   Final next level of care: Home/Self Care     Patient Goals and CMS Choice        Discharge Placement                       Discharge Plan and Services                                     Social Determinants of Health (SDOH) Interventions     Readmission Risk Interventions     No data to display

## 2022-04-05 NOTE — Plan of Care (Signed)

## 2022-04-05 NOTE — Progress Notes (Signed)
Plastic Surgery  POD#4 bilateral breast reconstruction with tissue expander acellular dermis left chest right latissimus dorsi flap POD#1 debridement right chest  Temp:  [97 F (36.1 C)-99.1 F (37.3 C)] 98.8 F (37.1 C) (09/23 0740) Pulse Rate:  [87-103] 100 (09/23 0740) Resp:  [14-17] 16 (09/23 0740) BP: (108-148)/(53-82) 116/79 (09/23 0740) SpO2:  [97 %-100 %] 100 % (09/23 0740)   PO 240  overnight  JPs L 10/20 R chest 40 right back 20  No events overnight  PE:  Chest: left chest soft incisions intact tegaderms in place  Right back incision dry intact Right chest incision dry intact Drains watery serosanguinous  A/P Reviewed intraop findings. Stable for discharge today. Has scheduled follow up. Has all post op medications at home. Discussed again will restart hidradenitis regimen at her post op visit.  Irene Limbo, MD [facility] Plastic & Reconstructive Surgery  Office/ physician access line after hours [phone]

## 2022-04-17 ENCOUNTER — Encounter: Payer: Self-pay | Admitting: Hematology and Oncology

## 2022-05-07 NOTE — Progress Notes (Signed)
Patient Care Team: [name], MD as PCP - General (Internal Medicine) Debara Pickett Nadean Corwin, MD as PCP - Cardiology (Cardiology) Donnie Mesa, MD as Consulting Physician (General Surgery) Nicholas Lose, MD as Consulting Physician (Hematology and Oncology) Gery Pray, MD as Consulting Physician (Radiation Oncology) Cindra Presume, MD (Inactive) as Consulting Physician (Plastic Surgery)  DIAGNOSIS: No diagnosis found.  SUMMARY OF ONCOLOGIC HISTORY: Oncology History  Malignant neoplasm of upper-outer quadrant of right breast in female, estrogen receptor negative ([place])  10/31/2020 Initial Diagnosis   Patient palpated a right breast mass x56mo Diagnostic mammogram and UKoreashowed an abnormality and calcifications in the right breast, 8.2cm, with skin thickening and right axillary adenopathy. 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   11/07/2020 Cancer Staging   Staging form: Breast, AJCC 8th Edition - Clinical: Stage IIIC (cT4, cN1, cM0, G3, ER-, PR-, HER2: Equivocal) - Signed by GNicholas Lose MD on 11/07/2020 Histologic grading system: 3 grade system   11/14/2020 Imaging   CT CAP 11/14/2020: Enlarged right axillary lymph nodes, prominent left axillary lymph node.  (Requires an ultrasound) no evidence of metastatic disease in the abdomen or pelvis Bone scan 11/16/2020: No evidence of bone metastases   11/22/2020 -  Chemotherapy   Patient is on Treatment Plan: BREAST PEMBROLIZUMAB + AC Q21D X 4 CYCLES FOLLOWED BY PEMBROLIZUMAB + CARBOPLATIN D1 + PACLITAXEL D1,8,15 Q21D X 4 CYCLES        Genetic Testing   Negative genetic testing. No pathogenic variants identified on the [facility]Exp[facility]RNA panel. The report date is 11/28/2020.  The CancerNext-Expanded + RNAinsight gene panel offered by [employer] includes sequencing and rearrangement analysis for the following 77 genes: IP,  ALK, APC*, ATM*, AXIN2, BAP1, BARD1, BLM, BMPR1A, BRCA1*, BRCA2*, BRIP1*, CDC73, CDH1*,CDK4, CDKN1B, CDKN2A, CHEK2*, CTNNA1, DICER1, FANCC, FH, FLCN, GALNT12, KIF1B, LZTR1, MAX, MEN1, MET, MLH1*, MSH2*, MSH3, MSH6*, MUTYH*, NBN, NF1*, NF2, NTHL1, PALB2*, PHOX2B, PMS2*, POT1, PRKAR1A, PTCH1, PTEN*, RAD51C*, RAD51D*,RB1, RECQL, RET, SDHA, SDHAF2, SDHB, SDHC, SDHD, SMAD4, SMARCA4, SMARCB1, SMARCE1, STK11, SUFU, TMEM127, TP53*,TSC1, TSC2, VHL and XRCC2 (sequencing and deletion/duplication); EGFR, EGLN1, HOXB13, KIT, MITF, PDGFRA, POLD1 and POLE (sequencing only); EPCAM and GREM1 (deletion/duplication only).   06/27/2021 Surgery   06/27/2021: Bilateral mastectomies: Left mastectomy: Benign Right mastectomy: Grade 3 IDC with high-grade DCIS 4.8 cm, margins negative, 8/21 lymph nodes positive   08/12/2021 - 09/20/2021 Radiation Therapy   08/12/2021 through 09/20/2021          CHIEF COMPLIANT: Follow-up breast cancer  INTERVAL HISTORY: Amber Tulleris a 46y.o. with above-mentioned history of breast cancer who received neoadjuvant chemotherapy which was discontinued early because of worsening peripheral neuropathy. She presents to the clinic today for follow-up.   ALLERGIES:  is allergic to paclitaxel and penicillins.  MEDICATIONS:  Current Outpatient Medications  Medication Sig Dispense Refill   amphetamine-dextroamphetamine (ADDERALL) 30 MG tablet Take 30 mg by mouth daily as needed (focus).     bisoprolol (ZEBETA) 10 MG tablet TAKE 1 TABLET BY MOUTH EVERY DAY 90 tablet 1   ferrous sulfate 325 (65 FE) MG tablet Take 325 mg by mouth daily.     hydrocortisone (CORTEF) 5 MG tablet Take 2 tablets (10 mg total) by mouth in the morning AND 1 tablet (5 mg total) daily before supper. (Patient taking differently: Take  5 mg by mouth in the morning and 2.5 mg daily before bedtime.) 90  tablet 3   Multiple Vitamin (MULTIVITAMIN WITH MINERALS) TABS tablet Take 1 tablet by mouth daily.     No current  facility-administered medications for this visit.   Facility-Administered Medications Ordered in Other Visits  Medication Dose Route Frequency Provider Last Rate Last Admin   filgrastim-aafi (NIVESTYM) 480 MCG/0.8ML injection             PHYSICAL EXAMINATION: ECOG PERFORMANCE STATUS: {CHL ONC ECOG [phone]}  There were no vitals filed for this visit. There were no vitals filed for this visit.  BREAST:*** No palpable masses or nodules in either right or left breasts. No palpable axillary supraclavicular or infraclavicular adenopathy no breast tenderness or nipple discharge. (exam performed in the presence of a chaperone)  LABORATORY DATA:  I have reviewed the data as listed    Latest Ref Rng & Units 04/03/2022    4:04 PM 03/24/2022   11:00 AM 12/14/2021    5:51 PM  CMP  Glucose 70 - 99 mg/dL 93  90  101   BUN 6 - 20 mg/dL '21  8  10   ' Creatinine 0.44 - 1.00 mg/dL 1.67  0.61  0.61   Sodium 135 - 145 mmol/L 136  138  141   Potassium 3.5 - 5.1 mmol/L 3.8  3.8  3.3   Chloride 98 - 111 mmol/L 110  108  109   CO2 22 - 32 mmol/L '21  23  25   ' Calcium 8.9 - 10.3 mg/dL 8.2  9.6  9.5   Total Protein 6.5 - 8.1 g/dL   8.1   Total Bilirubin 0.3 - 1.2 mg/dL   0.5   Alkaline Phos 38 - 126 U/L   42   AST 15 - 41 U/L   17   ALT 0 - 44 U/L   11     Lab Results  Component Value Date   WBC 5.2 04/03/2022   HGB 8.9 (L) 04/03/2022   HCT 27.0 (L) 04/03/2022   MCV 89.4 04/03/2022   PLT 292 04/03/2022   NEUTROABS 1.4 (L) 02/04/2022    ASSESSMENT & PLAN:  No problem-specific Assessment & Plan notes found for this encounter.    No orders of the defined types were placed in this encounter.  The patient has a good understanding of the overall plan. she agrees with it. she will call with any problems that may develop before the next visit here. Total time spent: 30 mins including face to face time and time spent for planning, charting and co-ordination of care   Suzzette Righter,  [place] 05/07/22    I Gardiner Coins am scribing for Dr. Lindi Adie  ***

## 2022-05-08 ENCOUNTER — Other Ambulatory Visit: Payer: Self-pay

## 2022-05-08 ENCOUNTER — Encounter: Payer: Self-pay | Admitting: Hematology and Oncology

## 2022-05-08 ENCOUNTER — Inpatient Hospital Stay
Payer: No Typology Code available for payment source | Attending: Hematology and Oncology | Admitting: Hematology and Oncology

## 2022-05-08 DIAGNOSIS — C50411 Malignant neoplasm of upper-outer quadrant of right female breast: Secondary | ICD-10-CM | POA: Insufficient documentation

## 2022-05-08 DIAGNOSIS — Z171 Estrogen receptor negative status [ER-]: Secondary | ICD-10-CM | POA: Insufficient documentation

## 2022-05-08 NOTE — Assessment & Plan Note (Signed)
calcifications in the right breast, 8.2cm, with skin thickening and right axillary adenopathy. 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  Treatment plan: 1. Neoadjuvant chemotherapy with Adriamycin and CytoxanKeytruda4 followed by Taxol weekly 12 with carboplatinand Keytrudaevery 3 weeks x4completed 10/6/2022followed by 1 year of torted Keytruda maintenance 2.06/27/2021: Bilateral mastectomies: Left mastectomy: Benign Right mastectomy: Grade 3 IDC with high-grade DCIS 4.8 cm, margins negative, 8/21 lymph nodes positive 3. Followed by adjuvant radiation therapy completed 09/20/2021 UR CC nausea study CT CAP 11/14/2020: Enlarged right axillary lymph nodes, prominent left axillary lymph node. 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 Immunotherapy has been discontinued. MRI brain: 05/24/2021: No focal masses, endocrinology follow-up  Emergency room visit: For shortness of breath: CT angiogram: No PE and no intrathoracic abnormalities. Our plan is to perform CT scans every 6 months for the first 2 years.  Worsening shortness of breath: Marked improvement with hydrocortisone (treating her for adrenal insufficiency) remarkable improvement in the strength energy levels and fatigue and shortness of breath.  Return to clinic in 1 year for follow-up

## 2022-05-12 ENCOUNTER — Telehealth: Payer: Self-pay | Admitting: Hematology and Oncology

## 2022-05-12 NOTE — Telephone Encounter (Signed)
Scheduled appointment per 10/26 los. Patient is aware. 

## 2022-05-16 ENCOUNTER — Other Ambulatory Visit: Payer: Self-pay | Admitting: Hematology and Oncology

## 2022-05-21 ENCOUNTER — Other Ambulatory Visit: Payer: Self-pay

## 2022-05-21 ENCOUNTER — Emergency Department (HOSPITAL_COMMUNITY)
Admission: EM | Admit: 2022-05-21 | Discharge: 2022-05-22 | Disposition: A | Discharge status: Home / Self Care | Payer: No Typology Code available for payment source | Source: Home / Self Care | Attending: Emergency Medicine | Admitting: Emergency Medicine

## 2022-05-21 ENCOUNTER — Emergency Department (HOSPITAL_COMMUNITY): Payer: No Typology Code available for payment source

## 2022-05-21 ENCOUNTER — Encounter (HOSPITAL_COMMUNITY): Payer: Self-pay | Admitting: *Deleted

## 2022-05-21 DIAGNOSIS — R112 Nausea with vomiting, unspecified: Secondary | ICD-10-CM

## 2022-05-21 DIAGNOSIS — N9489 Other specified conditions associated with female genital organs and menstrual cycle: Secondary | ICD-10-CM | POA: Insufficient documentation

## 2022-05-21 DIAGNOSIS — J690 Pneumonitis due to inhalation of food and vomit: Secondary | ICD-10-CM | POA: Insufficient documentation

## 2022-05-21 DIAGNOSIS — I10 Essential (primary) hypertension: Secondary | ICD-10-CM | POA: Insufficient documentation

## 2022-05-21 DIAGNOSIS — Z20822 Contact with and (suspected) exposure to covid-19: Secondary | ICD-10-CM | POA: Insufficient documentation

## 2022-05-21 DIAGNOSIS — Z853 Personal history of malignant neoplasm of breast: Secondary | ICD-10-CM | POA: Insufficient documentation

## 2022-05-21 DIAGNOSIS — J189 Pneumonia, unspecified organism: Secondary | ICD-10-CM | POA: Diagnosis not present

## 2022-05-21 DIAGNOSIS — R7309 Other abnormal glucose: Secondary | ICD-10-CM | POA: Insufficient documentation

## 2022-05-21 LAB — MAGNESIUM: Magnesium: 1.5 mg/dL — ABNORMAL LOW (ref 1.7–2.4)

## 2022-05-21 LAB — CBC WITH DIFFERENTIAL/PLATELET
Abs Immature Granulocytes: 0.02 10*3/uL (ref 0.00–0.07)
Basophils Absolute: 0 10*3/uL (ref 0.0–0.1)
Basophils Relative: 0 %
Eosinophils Absolute: 0.1 10*3/uL (ref 0.0–0.5)
Eosinophils Relative: 2 %
HCT: 35.1 % — ABNORMAL LOW (ref 36.0–46.0)
Hemoglobin: 11.2 g/dL — ABNORMAL LOW (ref 12.0–15.0)
Immature Granulocytes: 0 %
Lymphocytes Relative: 38 %
Lymphs Abs: 2.1 10*3/uL (ref 0.7–4.0)
MCH: 28.9 pg (ref 26.0–34.0)
MCHC: 31.9 g/dL (ref 30.0–36.0)
MCV: 90.7 fL (ref 80.0–100.0)
Monocytes Absolute: 0.5 10*3/uL (ref 0.1–1.0)
Monocytes Relative: 9 %
Neutro Abs: 2.8 10*3/uL (ref 1.7–7.7)
Neutrophils Relative %: 51 %
Platelets: 259 10*3/uL (ref 150–400)
RBC: 3.87 MIL/uL (ref 3.87–5.11)
RDW: 13.6 % (ref 11.5–15.5)
WBC: 5.5 10*3/uL (ref 4.0–10.5)
nRBC: 0 % (ref 0.0–0.2)

## 2022-05-21 LAB — COMPREHENSIVE METABOLIC PANEL
ALT: 16 U/L (ref 0–44)
AST: 26 U/L (ref 15–41)
Albumin: 3.7 g/dL (ref 3.5–5.0)
Alkaline Phosphatase: 44 U/L (ref 38–126)
Anion gap: 9 (ref 5–15)
BUN: 11 mg/dL (ref 6–20)
CO2: 21 mmol/L — ABNORMAL LOW (ref 22–32)
Calcium: 9.9 mg/dL (ref 8.9–10.3)
Chloride: 108 mmol/L (ref 98–111)
Creatinine, Ser: 0.63 mg/dL (ref 0.44–1.00)
GFR, Estimated: >60 mL/min (ref >60)
Glucose, Bld: 70 mg/dL (ref 70–99)
Potassium: 3.8 mmol/L (ref 3.5–5.1)
Sodium: 138 mmol/L (ref 135–145)
Total Bilirubin: 0.9 mg/dL (ref 0.3–1.2)
Total Protein: 7.8 g/dL (ref 6.5–8.1)

## 2022-05-21 LAB — CBG MONITORING, ED
Glucose-Capillary: 90 mg/dL (ref 70–99)
Glucose-Capillary: 71 mg/dL (ref 70–99)

## 2022-05-21 LAB — TYPE AND SCREEN
ABO/RH(D): O POS
Antibody Screen: NEGATIVE

## 2022-05-21 LAB — I-STAT BETA HCG BLOOD, ED (MC, WL, AP ONLY): I-stat hCG, quantitative: <5 m[IU]/mL (ref <5)

## 2022-05-21 LAB — RESP PANEL BY RT-PCR (FLU A&B, COVID) ARPGX2
Influenza A by PCR: NEGATIVE
Influenza B by PCR: NEGATIVE
SARS Coronavirus 2 by RT PCR: NEGATIVE

## 2022-05-21 LAB — LACTIC ACID, PLASMA: Lactic Acid, Venous: 1.6 mmol/L (ref 0.5–1.9)

## 2022-05-21 MED ORDER — TRIMETHOBENZAMIDE HCL 100 MG/ML IM SOLN
200.0000 mg | Freq: Once | INTRAMUSCULAR | Status: AC
  Administered 2022-05-21: 200 mg via INTRAMUSCULAR
  Filled 2022-05-21: qty 2

## 2022-05-21 MED ORDER — LACTATED RINGERS IV BOLUS
2000.0000 mL | Freq: Once | INTRAVENOUS | Status: AC
  Administered 2022-05-21: 2000 mL via INTRAVENOUS

## 2022-05-21 NOTE — ED Provider Notes (Signed)
[facility] DEPT Provider Note   CSN: [id number] Arrival date & time: 05/21/22  1936     History  Chief Complaint  Patient presents with   Emesis    Amber White is a 46 y.o. female.  46 year old female with vomiting and diarrhea onset Monday, no PO intake since Sunday. Denies blood in stool or emesis. Also frontal headache onset yesterday. Denies changes in vision, speech,gait. Denies neck pain, chest pain, abdominal pain, fevers, chills.    History of breast cancer, not on chemo, adrenal insufficiency, cardiomyopathy, HTN.   No prior abdominal surgeries.          Home Medications Prior to Admission medications   Medication Sig Start Date End Date Taking? Authorizing Provider  moxifloxacin (AVELOX) 400 MG tablet Take 1 tablet (400 mg total) by mouth daily at 6 (six) AM. 05/22/22  Yes Pollina, Gwenyth Allegra, MD  ondansetron (ZOFRAN) 4 MG tablet Take 1 tablet (4 mg total) by mouth every 6 (six) hours. 05/22/22  Yes Pollina, Gwenyth Allegra, MD  bisoprolol (ZEBETA) 10 MG tablet TAKE 1 TABLET BY MOUTH EVERY DAY Patient not taking: Reported on 05/22/2022 01/02/22   Nicholas Lose, MD  hydrocortisone (CORTEF) 5 MG tablet TAKE 1 TABLET BY MOUTH IN THE MORNING AND 1/2 TABLET DAILY BEFORE SUPPER. Patient not taking: Reported on 05/22/2022 05/16/22   Nicholas Lose, MD  prochlorperazine (COMPAZINE) 10 MG tablet Take 1 tablet (10 mg total) by mouth every 6 (six) hours as needed (Nausea or vomiting). 11/07/20 04/25/21  Nicholas Lose, MD      Allergies    Paclitaxel and Penicillins    Review of Systems   Review of Systems Negative except as per HPI Physical Exam Updated Vital Signs BP 104/83   Pulse (!) 118   Temp 98.1 F (36.7 C)   Resp 19   LMP 10/26/2020 Comment: UPT neg DOS  SpO2 96%  Physical Exam Vitals and nursing note reviewed.  Constitutional:      General: She is not in acute distress.    Appearance: She is well-developed. She is  ill-appearing. She is not diaphoretic.  HENT:     Head: Normocephalic and atraumatic.     Nose: Nose normal.     Mouth/Throat:     Mouth: Mucous membranes are dry.  Cardiovascular:     Rate and Rhythm: Regular rhythm. Tachycardia present.     Pulses: Normal pulses.     Heart sounds: Normal heart sounds.  Pulmonary:     Effort: Pulmonary effort is normal.     Breath sounds: Normal breath sounds.  Abdominal:     Palpations: Abdomen is soft.     Tenderness: There is no abdominal tenderness.  Musculoskeletal:     Cervical back: Neck supple.     Right lower leg: No edema.     Left lower leg: No edema.  Lymphadenopathy:     Cervical: No cervical adenopathy.  Skin:    General: Skin is warm and dry.     Findings: No erythema or rash.  Neurological:     Mental Status: She is alert and oriented to person, place, and time.     Sensory: No sensory deficit.     Motor: No weakness.  Psychiatric:        Behavior: Behavior normal.     ED Results / Procedures / Treatments   Labs (all labs ordered are listed, but only abnormal results are displayed) Labs Reviewed  MAGNESIUM - Abnormal; Notable  for the following components:      Result Value   Magnesium 1.5 (*)    All other components within normal limits  CBC WITH DIFFERENTIAL/PLATELET - Abnormal; Notable for the following components:   Hemoglobin 11.2 (*)    HCT 35.1 (*)    All other components within normal limits  COMPREHENSIVE METABOLIC PANEL - Abnormal; Notable for the following components:   CO2 21 (*)    All other components within normal limits  URINALYSIS, ROUTINE W REFLEX MICROSCOPIC - Abnormal; Notable for the following components:   Specific Gravity, Urine >1.046 (*)    Ketones, ur 80 (*)    Leukocytes,Ua TRACE (*)    All other components within normal limits  RESP PANEL BY RT-PCR (FLU A&B, COVID) ARPGX2  CULTURE, BLOOD (ROUTINE X 2)  CULTURE, BLOOD (ROUTINE X 2)  GASTROINTESTINAL PANEL BY PCR, STOOL (REPLACES STOOL  CULTURE)  C DIFFICILE QUICK SCREEN W PCR REFLEX    LACTIC ACID, PLASMA  LACTIC ACID, PLASMA  I-STAT BETA HCG BLOOD, ED ([facility], [facility], [facility] ONLY)  CBG MONITORING, ED  CBG MONITORING, ED  TYPE AND SCREEN    EKG EKG Interpretation  Date/Time:  Wednesday May 21 2022 19:51:39 EST Ventricular Rate:  146 PR Interval:  109 QRS Duration: 78 QT Interval:  390 QTC Calculation: 608 R Axis:   6 Text Interpretation: Sinus tachycardia LAE, consider biatrial enlargement Anteroseptal infarct, age indeterminate Prolonged QT interval Confirmed by Orpah Greek [phone]) on 05/22/2022 4:12:03 AM  Radiology CT ABDOMEN PELVIS W CONTRAST  Result Date: 05/22/2022 CLINICAL DATA:  Abdominal pain, nausea/vomiting EXAM: CT ABDOMEN AND PELVIS WITH CONTRAST TECHNIQUE: Multidetector CT imaging of the abdomen and pelvis was performed using the standard protocol following bolus administration of intravenous contrast. RADIATION DOSE REDUCTION: This exam was performed according to the departmental dose-optimization program which includes automated exposure control, adjustment of the mA and/or kV according to patient size and/or use of iterative reconstruction technique. CONTRAST:  131m OMNIPAQUE IOHEXOL 300 MG/ML  SOLN COMPARISON:  11/14/2020 FINDINGS: Lower chest: Focal patchy opacity the left lung base, suspicious for pneumonia. Hepatobiliary: Liver is within normal limits. Status post cholecystectomy. No intrahepatic or extrahepatic ductal dilatation. Pancreas: Within normal limits. Spleen: Within normal limits Adrenals/Urinary Tract: Adrenal glands are within normal limits. Kidneys are normal limits.  No hydronephrosis. Bladder is within normal limits. Stomach/Bowel: Stomach is within normal limits. No evidence of bowel obstruction. Normal appendix (series 2/image 1). No colonic wall thickening or inflammatory changes. Vascular/Lymphatic: No evidence of abdominal aortic aneurysm. No suspicious abdominopelvic  lymphadenopathy. Reproductive: Uterus is within normal limits. No adnexal masses. Other: No abdominopelvic ascites. Musculoskeletal: Visualized osseous structures are within normal limits. IMPRESSION: Focal patchy opacity at the left lung base, suspicious for pneumonia. Otherwise negative CT abdomen/pelvis. Electronically Signed   By: SJulian HyM.D.   On: 05/22/2022 02:58   DG Chest Port 1 View  Result Date: 05/21/2022 CLINICAL DATA:  Weakness EXAM: PORTABLE CHEST 1 VIEW COMPARISON:  09/09/2021 FINDINGS: Lungs are clear.  No pleural effusion or pneumothorax. The heart is normal in size. IMPRESSION: No evidence of acute cardiopulmonary disease. Electronically Signed   By: SJulian HyM.D.   On: 05/21/2022 21:40    Procedures Procedures    Medications Ordered in ED Medications  magnesium oxide (MAG-OX) tablet 400 mg (has no administration in time range)  sodium chloride (PF) 0.9 % injection (has no administration in time range)  levofloxacin (LEVAQUIN) IVPB 750 mg (0 mg Intravenous Stopped 05/22/22  0915)  lactated ringers bolus 2,000 mL (0 mLs Intravenous Stopped 05/22/22 0046)  trimethobenzamide (TIGAN) injection 200 mg (200 mg Intramuscular Given 05/21/22 2049)  iohexol (OMNIPAQUE) 300 MG/ML solution 100 mL (100 mLs Intravenous Contrast Given 05/22/22 0241)  lactated ringers bolus 1,000 mL (0 mLs Intravenous Stopped 05/22/22 0900)    ED Course/ Medical Decision Making/ A&P                           Medical Decision Making Amount and/or Complexity of Data Reviewed Labs: ordered. Radiology: ordered.  Risk OTC drugs. Prescription drug management.   This patient presents to the ED for concern of vomiting, diarrhea, headache, this involves an extensive number of treatment options, and is a complaint that carries with it a high risk of complications and morbidity.  The differential diagnosis includes but not limited to viral illness, bowel obstruction, colitis,  gastroenteritis   Co morbidities that complicate the patient evaluation  Adrenal insufficiency, breast cancer, not currently on chemo.  Cardiomyopathy, hypertension   Additional history obtained:  External records from outside source obtained and reviewed including most recent visit to oncology office dated 05/08/2022.  Prior labs on file for comparison   Lab Tests:  I Ordered, and personally interpreted labs.  The pertinent results include: COVID and flu negative, hCG negative.  Additional labs pending at time of signout.   Imaging Studies ordered:  I ordered imaging studies including chest x-ray I independently visualized and interpreted imaging which showed no acute disease I agree with the radiologist interpretation   Cardiac Monitoring: / EKG:  The patient was maintained on a cardiac monitor.  I personally viewed and interpreted the cardiac monitored which showed an underlying rhythm of: Sinus tachycardia, rate 146  Problem List / ED Course / Critical interventions / Medication management  46 year old female with past medical history as listed above presents with complaint of vomiting and diarrhea onset earlier this week.  Nonbloody emesis and stools.  Patient is afebrile but tachycardic.  Due to her history of adrenal insufficiency, has taken her Cortef at home.  No known sick contacts, no recent travel.  She is ill-appearing on exam, tachycardic although rate improved compared to her triage rate in the 140s, currently in the low 100s.  She is afebrile with soft blood pressure especially considering she has a history of hypertension and has not been able to keep down her blood pressure medication for the past few days.  Case was discussed with the ER attending, Dr. Pearline Cables who has seen the patient.  Plan is for labs, fluids, chest x-ray and monitoring.  Care signed out to Dr. Pearline Cables at change of shift pending completion of work-up and final disposition. I ordered medication including  IV fluids for tachycardia Reevaluation of the patient after these medicines showed that the patient improved I have reviewed the patients home medicines and have made adjustments as needed   Social Determinants of Health:  Has PCP and specialty follow-up care available   Test / Admission - Considered:  Final disposition pending at time of signout         Final Clinical Impression(s) / ED Diagnoses Final diagnoses:  Nausea vomiting and diarrhea  Aspiration pneumonia of left lower lobe due to gastric secretions ([place])    Rx / DC Orders ED Discharge Orders          Ordered    moxifloxacin (AVELOX) 400 MG tablet  Daily  05/22/22 0727    ondansetron (ZOFRAN) 4 MG tablet  Every 6 hours        05/22/22 0727              Tacy Learn, PA-C 53/69/22 3009    Lianne Cure, DO 79/49/97 [phone]

## 2022-05-21 NOTE — ED Triage Notes (Signed)
Patient states that she has not been feeling well for about 2 days. She has felt weak, had a headache, vomiting, diarrhea. Denies blood in her stool. Pt denies cough/fevers.

## 2022-05-21 NOTE — ED Provider Triage Note (Signed)
Emergency Medicine Provider Triage Evaluation Note  Amber White , a 46 y.o. female  was evaluated in triage.  Pt complains of nausea and vomiting the past two days. Denies any abdominal pain. The patient has not been able to keep down any food of fluids. Denies any chest pain or SOB. H/o breast cancer.  Review of Systems  Positive:  Negative:   Physical Exam  BP (!) 110/90 (BP Location: Left Arm)   Pulse (!) 144   Temp (!) 97.5 F (36.4 C) (Oral)   Resp 17   LMP 10/26/2020 Comment: UPT neg DOS  SpO2 99%  Gen:   Awake, no distress, pale Resp:  Normal effort  MSK:   Moves extremities without difficulty  Other:  No abdominal tenderness to palpation.   Medical Decision Making  Medically screening exam initiated at 8:04 PM.  Appropriate orders placed.  Amber White was informed that the remainder of the evaluation will be completed by another provider, this initial triage assessment does not replace that evaluation, and the importance of remaining in the ED until their evaluation is complete.  Patient is tachycardic with lower blood pressures. 2L bolus ordered. Nursing aware that patient needs to be roomed NOW.   Sherrell Puller, PA-C 05/21/22 2006

## 2022-05-22 ENCOUNTER — Other Ambulatory Visit: Payer: Self-pay

## 2022-05-22 ENCOUNTER — Emergency Department (HOSPITAL_COMMUNITY): Payer: No Typology Code available for payment source

## 2022-05-22 ENCOUNTER — Encounter (HOSPITAL_COMMUNITY): Payer: Self-pay | Admitting: Emergency Medicine

## 2022-05-22 ENCOUNTER — Encounter (HOSPITAL_COMMUNITY): Payer: Self-pay

## 2022-05-22 ENCOUNTER — Inpatient Hospital Stay (HOSPITAL_COMMUNITY)
Admission: EM | Admit: 2022-05-22 | Discharge: 2022-05-28 | DRG: 193 | Disposition: A | Discharge status: Home / Self Care | Payer: No Typology Code available for payment source | Attending: Internal Medicine | Admitting: Internal Medicine

## 2022-05-22 DIAGNOSIS — J189 Pneumonia, unspecified organism: Principal | ICD-10-CM | POA: Diagnosis present

## 2022-05-22 DIAGNOSIS — R7881 Bacteremia: Secondary | ICD-10-CM | POA: Diagnosis present

## 2022-05-22 DIAGNOSIS — Z9221 Personal history of antineoplastic chemotherapy: Secondary | ICD-10-CM

## 2022-05-22 DIAGNOSIS — Z1152 Encounter for screening for COVID-19: Secondary | ICD-10-CM

## 2022-05-22 DIAGNOSIS — Z803 Family history of malignant neoplasm of breast: Secondary | ICD-10-CM

## 2022-05-22 DIAGNOSIS — R443 Hallucinations, unspecified: Secondary | ICD-10-CM | POA: Diagnosis present

## 2022-05-22 DIAGNOSIS — Z888 Allergy status to other drugs, medicaments and biological substances status: Secondary | ICD-10-CM

## 2022-05-22 DIAGNOSIS — E860 Dehydration: Secondary | ICD-10-CM | POA: Diagnosis not present

## 2022-05-22 DIAGNOSIS — G9341 Metabolic encephalopathy: Secondary | ICD-10-CM | POA: Diagnosis present

## 2022-05-22 DIAGNOSIS — G620 Drug-induced polyneuropathy: Secondary | ICD-10-CM | POA: Diagnosis present

## 2022-05-22 DIAGNOSIS — R410 Disorientation, unspecified: Secondary | ICD-10-CM | POA: Insufficient documentation

## 2022-05-22 DIAGNOSIS — Z923 Personal history of irradiation: Secondary | ICD-10-CM

## 2022-05-22 DIAGNOSIS — Z880 Allergy status to penicillin: Secondary | ICD-10-CM

## 2022-05-22 DIAGNOSIS — Z79899 Other long term (current) drug therapy: Secondary | ICD-10-CM

## 2022-05-22 DIAGNOSIS — B9689 Other specified bacterial agents as the cause of diseases classified elsewhere: Secondary | ICD-10-CM | POA: Diagnosis present

## 2022-05-22 DIAGNOSIS — G9340 Encephalopathy, unspecified: Secondary | ICD-10-CM | POA: Diagnosis not present

## 2022-05-22 DIAGNOSIS — Z833 Family history of diabetes mellitus: Secondary | ICD-10-CM

## 2022-05-22 DIAGNOSIS — Z9013 Acquired absence of bilateral breasts and nipples: Secondary | ICD-10-CM

## 2022-05-22 DIAGNOSIS — Z853 Personal history of malignant neoplasm of breast: Secondary | ICD-10-CM

## 2022-05-22 DIAGNOSIS — I10 Essential (primary) hypertension: Secondary | ICD-10-CM | POA: Insufficient documentation

## 2022-05-22 DIAGNOSIS — Z8249 Family history of ischemic heart disease and other diseases of the circulatory system: Secondary | ICD-10-CM

## 2022-05-22 DIAGNOSIS — E669 Obesity, unspecified: Secondary | ICD-10-CM | POA: Diagnosis present

## 2022-05-22 DIAGNOSIS — Z6837 Body mass index (BMI) 37.0-37.9, adult: Secondary | ICD-10-CM

## 2022-05-22 DIAGNOSIS — T451X5D Adverse effect of antineoplastic and immunosuppressive drugs, subsequent encounter: Secondary | ICD-10-CM

## 2022-05-22 DIAGNOSIS — L89152 Pressure ulcer of sacral region, stage 2: Secondary | ICD-10-CM | POA: Diagnosis present

## 2022-05-22 DIAGNOSIS — E876 Hypokalemia: Secondary | ICD-10-CM | POA: Diagnosis not present

## 2022-05-22 DIAGNOSIS — I429 Cardiomyopathy, unspecified: Secondary | ICD-10-CM | POA: Diagnosis present

## 2022-05-22 DIAGNOSIS — L8990 Pressure ulcer of unspecified site, unspecified stage: Secondary | ICD-10-CM | POA: Insufficient documentation

## 2022-05-22 DIAGNOSIS — R29898 Other symptoms and signs involving the musculoskeletal system: Secondary | ICD-10-CM

## 2022-05-22 LAB — URINALYSIS, ROUTINE W REFLEX MICROSCOPIC
Bacteria, UA: NONE SEEN
Bilirubin Urine: NEGATIVE
Glucose, UA: NEGATIVE mg/dL
Hgb urine dipstick: NEGATIVE
Ketones, ur: 80 mg/dL — AB
Nitrite: NEGATIVE
Protein, ur: NEGATIVE mg/dL
Specific Gravity, Urine: >1.046 — ABNORMAL HIGH (ref 1.005–1.030)
pH: 5 (ref 5.0–8.0)

## 2022-05-22 LAB — CBC WITH DIFFERENTIAL/PLATELET
Abs Immature Granulocytes: 0.01 10*3/uL (ref 0.00–0.07)
Basophils Absolute: 0 10*3/uL (ref 0.0–0.1)
Basophils Relative: 1 %
Eosinophils Absolute: 0.1 10*3/uL (ref 0.0–0.5)
Eosinophils Relative: 2 %
HCT: 32.4 % — ABNORMAL LOW (ref 36.0–46.0)
Hemoglobin: 10.6 g/dL — ABNORMAL LOW (ref 12.0–15.0)
Immature Granulocytes: 0 %
Lymphocytes Relative: 38 %
Lymphs Abs: 1.6 10*3/uL (ref 0.7–4.0)
MCH: 29.4 pg (ref 26.0–34.0)
MCHC: 32.7 g/dL (ref 30.0–36.0)
MCV: 90 fL (ref 80.0–100.0)
Monocytes Absolute: 0.4 10*3/uL (ref 0.1–1.0)
Monocytes Relative: 10 %
Neutro Abs: 2 10*3/uL (ref 1.7–7.7)
Neutrophils Relative %: 49 %
Platelets: 229 10*3/uL (ref 150–400)
RBC: 3.6 MIL/uL — ABNORMAL LOW (ref 3.87–5.11)
RDW: 13.6 % (ref 11.5–15.5)
WBC: 4.1 10*3/uL (ref 4.0–10.5)
nRBC: 0 % (ref 0.0–0.2)

## 2022-05-22 LAB — COMPREHENSIVE METABOLIC PANEL
ALT: 13 U/L (ref 0–44)
AST: 24 U/L (ref 15–41)
Albumin: 3.6 g/dL (ref 3.5–5.0)
Alkaline Phosphatase: 42 U/L (ref 38–126)
Anion gap: 9 (ref 5–15)
BUN: 11 mg/dL (ref 6–20)
CO2: 20 mmol/L — ABNORMAL LOW (ref 22–32)
Calcium: 9.7 mg/dL (ref 8.9–10.3)
Chloride: 107 mmol/L (ref 98–111)
Creatinine, Ser: 0.65 mg/dL (ref 0.44–1.00)
GFR, Estimated: >60 mL/min (ref >60)
Glucose, Bld: 98 mg/dL (ref 70–99)
Potassium: 4 mmol/L (ref 3.5–5.1)
Sodium: 136 mmol/L (ref 135–145)
Total Bilirubin: 0.7 mg/dL (ref 0.3–1.2)
Total Protein: 8 g/dL (ref 6.5–8.1)

## 2022-05-22 LAB — AMMONIA: Ammonia: <10 umol/L (ref 9–35)

## 2022-05-22 MED ORDER — SODIUM CHLORIDE (PF) 0.9 % IJ SOLN
INTRAMUSCULAR | Status: AC
  Filled 2022-05-22: qty 50

## 2022-05-22 MED ORDER — HYDROCORTISONE SOD SUC (PF) 100 MG IJ SOLR: 100.0000 mg | Freq: Once | INTRAMUSCULAR | Status: DC

## 2022-05-22 MED ORDER — IOHEXOL 300 MG/ML  SOLN
100.0000 mL | Freq: Once | INTRAMUSCULAR | Status: AC | PRN
  Administered 2022-05-22: 100 mL via INTRAVENOUS

## 2022-05-22 MED ORDER — ONDANSETRON HCL 4 MG PO TABS: 4.0000 mg | ORAL_TABLET | Freq: Four times a day (QID) | ORAL | 0 refills | Status: AC

## 2022-05-22 MED ORDER — MOXIFLOXACIN HCL 400 MG PO TABS: 400.0000 mg | ORAL_TABLET | Freq: Every day | ORAL | 0 refills | Status: DC

## 2022-05-22 MED ORDER — HYDROCORTISONE SOD SUC (PF) 100 MG IJ SOLR
100.0000 mg | Freq: Once | INTRAMUSCULAR | Status: AC
  Administered 2022-05-23: 100 mg via INTRAVENOUS
  Filled 2022-05-22: qty 2

## 2022-05-22 MED ORDER — MAGNESIUM OXIDE -MG SUPPLEMENT 400 (240 MG) MG PO TABS: 400.0000 mg | ORAL_TABLET | Freq: Every day | ORAL | Status: DC

## 2022-05-22 MED ORDER — LACTATED RINGERS IV BOLUS
1000.0000 mL | Freq: Once | INTRAVENOUS | Status: AC
  Administered 2022-05-22: 1000 mL via INTRAVENOUS

## 2022-05-22 MED ORDER — LEVOFLOXACIN IN D5W 750 MG/150ML IV SOLN
750.0000 mg | INTRAVENOUS | Status: DC
  Administered 2022-05-22: 750 mg via INTRAVENOUS
  Filled 2022-05-22: qty 150

## 2022-05-22 NOTE — ED Provider Notes (Signed)
[facility] DEPT Provider Note   CSN: [id number] Arrival date & time: 05/22/22  2115     History  Chief Complaint  Patient presents with   Leg Pain    Amber White is a 46 y.o. female.  Patient has a history of breast cancer but is not getting chemotherapy.  She also has a history of adrenal insufficiency.  She started with some nausea and vomiting on Monday and has been bedridden.  She was seen in the emergency department and had a CT of her abdomen that suggested pneumonia.  She has been extremely weak in her legs and arms for couple days now and came back to the emergency department because she became more confused  The history is provided by the patient and medical records. No language interpreter was used.  Weakness Severity:  Severe Onset quality:  Gradual Timing:  Constant Progression:  Worsening Chronicity:  New Context: not alcohol use   Relieved by:  Nothing Worsened by:  Nothing Ineffective treatments:  None tried Associated symptoms: no abdominal pain, no chest pain, no cough, no diarrhea, no frequency, no headaches and no seizures        Home Medications Prior to Admission medications   Medication Sig Start Date End Date Taking? Authorizing Provider  moxifloxacin (AVELOX) 400 MG tablet Take 1 tablet (400 mg total) by mouth daily at 6 (six) AM. 05/22/22  Yes Pollina, Gwenyth Allegra, MD  ondansetron (ZOFRAN) 4 MG tablet Take 1 tablet (4 mg total) by mouth every 6 (six) hours. 05/22/22  Yes Pollina, Gwenyth Allegra, MD  bisoprolol (ZEBETA) 10 MG tablet TAKE 1 TABLET BY MOUTH EVERY DAY Patient not taking: Reported on 05/22/2022 01/02/22   Nicholas Lose, MD  hydrocortisone (CORTEF) 5 MG tablet TAKE 1 TABLET BY MOUTH IN THE MORNING AND 1/2 TABLET DAILY BEFORE SUPPER. Patient not taking: Reported on 05/22/2022 05/16/22   Nicholas Lose, MD  prochlorperazine (COMPAZINE) 10 MG tablet Take 1 tablet (10 mg total) by mouth every 6 (six) hours as needed  (Nausea or vomiting). 11/07/20 04/25/21  Nicholas Lose, MD      Allergies    Paclitaxel and Penicillins    Review of Systems   Review of Systems  Constitutional:  Negative for appetite change and fatigue.  HENT:  Negative for congestion, ear discharge and sinus pressure.   Eyes:  Negative for discharge.  Respiratory:  Negative for cough.   Cardiovascular:  Negative for chest pain.  Gastrointestinal:  Negative for abdominal pain and diarrhea.  Genitourinary:  Negative for frequency and hematuria.  Musculoskeletal:  Positive for back pain.       Weakness in her legs and arms.  Skin:  Negative for rash.  Neurological:  Positive for weakness. Negative for seizures and headaches.  Psychiatric/Behavioral:  Negative for hallucinations.     Physical Exam Updated Vital Signs BP 117/74   Pulse (!) 115   Temp 98.3 F (36.8 C) (Oral)   Resp (!) 21   Ht '5\' 4"'$  (1.626 m)   Wt 97.3 kg   LMP 10/26/2020 Comment: UPT neg DOS  SpO2 100%   BMI 36.82 kg/m  Physical Exam Vitals and nursing note reviewed.  Constitutional:      Appearance: She is well-developed.  HENT:     Head: Normocephalic.     Nose: Nose normal.  Eyes:     General: No scleral icterus.    Conjunctiva/sclera: Conjunctivae normal.  Neck:     Thyroid: No thyromegaly.  Cardiovascular:  Rate and Rhythm: Normal rate and regular rhythm.     Heart sounds: No murmur heard.    No friction rub. No gallop.  Pulmonary:     Breath sounds: No stridor. No wheezing or rales.  Chest:     Chest wall: No tenderness.  Abdominal:     General: There is no distension.     Tenderness: There is no abdominal tenderness. There is no rebound.  Musculoskeletal:     Cervical back: Neck supple.     Comments: Patient has profound weakness in her lower extremities.  She can pick her right leg up from the bed about 6 inches and she can not pick up her left leg.  She also has moderate weakness in her upper extremities with worse weakness on the  left side  Lymphadenopathy:     Cervical: No cervical adenopathy.  Skin:    Findings: No erythema or rash.  Neurological:     Mental Status: She is alert.     Motor: No abnormal muscle tone.     Coordination: Coordination normal.     Comments: Patient oriented to person and place but confused about the situation     ED Results / Procedures / Treatments   Labs (all labs ordered are listed, but only abnormal results are displayed) Labs Reviewed  CBC WITH DIFFERENTIAL/PLATELET - Abnormal; Notable for the following components:      Result Value   RBC 3.60 (*)    Hemoglobin 10.6 (*)    HCT 32.4 (*)    All other components within normal limits  COMPREHENSIVE METABOLIC PANEL  MAGNESIUM  CK  VITAMIN B1  VITAMIN B12  METHYLMALONIC ACID, SERUM  AMMONIA  TROPONIN I (HIGH SENSITIVITY)    EKG None  Radiology CT Head Wo Contrast  Result Date: 05/22/2022 CLINICAL DATA:  Dizziness, persistent/recurrent, cardiac or vascular cause suspected Hospital discharge today. EXAM: CT HEAD WITHOUT CONTRAST TECHNIQUE: Contiguous axial images were obtained from the base of the skull through the vertex without intravenous contrast. RADIATION DOSE REDUCTION: This exam was performed according to the departmental dose-optimization program which includes automated exposure control, adjustment of the mA and/or kV according to patient size and/or use of iterative reconstruction technique. COMPARISON:  Brain MRI 05/24/2021 FINDINGS: Brain: No evidence of acute infarction, hemorrhage, hydrocephalus, extra-axial collection or mass lesion/mass effect. Vascular: No hyperdense vessel or unexpected calcification. Skull: Normal. Negative for fracture or focal lesion. Sinuses/Orbits: Minimal mucosal thickening of scattered ethmoid air cells. No mastoid effusion. Unremarkable orbits. Other: None. IMPRESSION: No acute intracranial abnormality. Electronically Signed   By: Keith Rake M.D.   On: 05/22/2022 23:18   CT  ABDOMEN PELVIS W CONTRAST  Result Date: 05/22/2022 CLINICAL DATA:  Abdominal pain, nausea/vomiting EXAM: CT ABDOMEN AND PELVIS WITH CONTRAST TECHNIQUE: Multidetector CT imaging of the abdomen and pelvis was performed using the standard protocol following bolus administration of intravenous contrast. RADIATION DOSE REDUCTION: This exam was performed according to the departmental dose-optimization program which includes automated exposure control, adjustment of the mA and/or kV according to patient size and/or use of iterative reconstruction technique. CONTRAST:  140m OMNIPAQUE IOHEXOL 300 MG/ML  SOLN COMPARISON:  11/14/2020 FINDINGS: Lower chest: Focal patchy opacity the left lung base, suspicious for pneumonia. Hepatobiliary: Liver is within normal limits. Status post cholecystectomy. No intrahepatic or extrahepatic ductal dilatation. Pancreas: Within normal limits. Spleen: Within normal limits Adrenals/Urinary Tract: Adrenal glands are within normal limits. Kidneys are normal limits.  No hydronephrosis. Bladder is within normal limits. Stomach/Bowel:  Stomach is within normal limits. No evidence of bowel obstruction. Normal appendix (series 2/image 1). No colonic wall thickening or inflammatory changes. Vascular/Lymphatic: No evidence of abdominal aortic aneurysm. No suspicious abdominopelvic lymphadenopathy. Reproductive: Uterus is within normal limits. No adnexal masses. Other: No abdominopelvic ascites. Musculoskeletal: Visualized osseous structures are within normal limits. IMPRESSION: Focal patchy opacity at the left lung base, suspicious for pneumonia. Otherwise negative CT abdomen/pelvis. Electronically Signed   By: Julian Hy M.D.   On: 05/22/2022 02:58   DG Chest Port 1 View  Result Date: 05/21/2022 CLINICAL DATA:  Weakness EXAM: PORTABLE CHEST 1 VIEW COMPARISON:  09/09/2021 FINDINGS: Lungs are clear.  No pleural effusion or pneumothorax. The heart is normal in size. IMPRESSION: No evidence of  acute cardiopulmonary disease. Electronically Signed   By: Julian Hy M.D.   On: 05/21/2022 21:40    Procedures Procedures    Medications Ordered in ED Medications  hydrocortisone sodium succinate (SOLU-CORTEF) 100 MG injection 100 mg (has no administration in time range)    ED Course/ Medical Decision Making/ A&P  CRITICAL CARE Performed by: Milton Ferguson Total critical care time: 45 minutes Critical care time was exclusive of separately billable procedures and treating other patients. Critical care was necessary to treat or prevent imminent or life-threatening deterioration. Critical care was time spent personally by me on the following activities: development of treatment plan with patient and/or surrogate as well as nursing, discussions with consultants, evaluation of patient's response to treatment, examination of patient, obtaining history from patient or surrogate, ordering and performing treatments and interventions, ordering and review of laboratory studies, ordering and review of radiographic studies, pulse oximetry and re-evaluation of patient's condition.   I spoke with Dr.Bhagat and she reviewed the CT scan of the head and suggested we get an MRI of her head and cervical spine because the symptoms are involving both arms and legs.  Labs are still pending.                         Medical Decision Making Amount and/or Complexity of Data Reviewed Labs: ordered. Radiology: ordered.  Risk Prescription drug management. Decision regarding hospitalization.     This patient presents to the ED for concern of extreme weakness, this involves an extensive number of treatment options, and is a complaint that carries with it a high risk of complications and morbidity.  The differential diagnosis includes metastatic disease, infection   Co morbidities that complicate the patient evaluation  Lung cancer   Additional history obtained:  Additional history obtained from  ex-husband External records from outside source obtained and reviewed including hospital records   Lab Tests:  I Ordered, and personally interpreted labs.  The pertinent results include: WBC 4.4 hemoglobin 10.8   Imaging Studies ordered:  I ordered imaging studies including CT head I independently visualized and interpreted imaging which showed negative I agree with the radiologist interpretation   Cardiac Monitoring: / EKG:  The patient was maintained on a cardiac monitor.  I personally viewed and interpreted the cardiac monitored which showed an underlying rhythm of: Normal sinus rhythm   Consultations Obtained:  I requested consultation with the neurology,  and discussed lab and imaging findings as well as pertinent plan - they recommend: MRI   Problem List / ED Course / Critical interventions / Medication management  Lung cancer and weakness I ordered medication including steroids for possible adrenal insufficiency Reevaluation of the patient after these medicines showed that the  patient stayed the same I have reviewed the patients home medicines and have made adjustments as needed   Social Determinants of Health:  None   Test / Admission - Considered:  MRI and possible LP   Confusion with significant weakness in lower extremities and milder weakness in her upper extremities.  Dr. Tinnie Gens will disposition the patient         Final Clinical Impression(s) / ED Diagnoses Final diagnoses:  None    Rx / DC Orders ED Discharge Orders     None         Milton Ferguson, MD 05/24/22 1114

## 2022-05-22 NOTE — Consult Note (Signed)
Neurology Consultation Reason for Consult: Altered mental status, weakness Requesting Physician: Teressa Lower  CC: Nausea/vomitting/diarrhea  History is obtained from: Husband at bedside and chart review  HPI: Amber White is a 46 y.o. 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 Lindi Adie  Patient able to provide minimal history due to her confusion/aphasia.  She lives with her 49 and 81 year old children, husband lives separately but talks to her daily.  He notes that she recently had breast reconstruction surgery and was meant to stay out of work for at least 6 weeks but returned to work fairly early he thinks about 2 to 3 weeks after her surgery (not atypical behavior for her).  She worked on Thursday and Friday at a her normal job at a [employer].  Over the weekend she had nausea vomiting and diarrhea and felt like she had a virus.  Monday and Tuesday she still picked up her children from school but on Wednesday she asked her husband to pick up the kids.  He dropped them home but did not see her that day as he did not want to catch her virus.  She was evaluated in the ED on Thursday with work-up revealing concern for pneumonia on CT chest.  She was given a dose of Levaquin and discharged.  However when her husband took her home on Thursday she continued to have trouble walking and began to have hallucinations.  LKW: Friday 11/3 Thrombolytic given?: No, out of the window Premorbid modified rankin scale:      0 - No symptoms.  ROS: Unable to  obtain due to altered mental status.   Past Medical History:  Diagnosis Date   Adrenal insufficiency ([place]) 05/2021   in setting of chemotherapy   Anemia    breast ca dx'd 10/2020   right   Cardiomyopathy [facility]) 05/2021   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   Family history of breast cancer    History of radiation therapy    right breast  08/12/2021-09/20/2021  Dr Gery Pray   Hypertension    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   Past Surgical History:  Procedure Laterality Date   APPLICATION OF A-CELL OF CHEST/ABDOMEN Left 04/01/2022   Procedure: ACELLULAR DERMIS TO LEFT CHEST;  Surgeon: Irene Limbo, MD;  Location: [place];  Service: Plastics;  Laterality: Left;   CHOLECYSTECTOMY     INCISION AND DRAINAGE OF WOUND Right 04/04/2022   Procedure: IRRIGATION AND DEBRIDEMENT OF CHEST , POSSIBLE REMOVAL OF TISSUE EXPANDER;  Surgeon: Irene Limbo, MD;  Location: [place];  Service: Plastics;  Laterality: Right;   LATISSIMUS FLAP TO BREAST Right 04/01/2022   Procedure: RIGHT LATISSIMUS DORSI FLAP TO RIGHT CHEST;  Surgeon: Irene Limbo, MD;  Location: [place];  Service: Plastics;  Laterality: Right;   MODIFIED MASTECTOMY Right 06/27/2021   Procedure: RIGHT MODIFIED RADICAL MASTECTOMY;  Surgeon: Donnie Mesa, MD;  Location: [place];  Service: General;  Laterality: Right;   PORT-A-CATH REMOVAL Right 11/05/2021   Procedure: REMOVAL PORT-A-CATH;  Surgeon: Donnie Mesa, MD;  Location: [place];  Service: General;  Laterality: Right;   PORTACATH PLACEMENT N/A 11/21/2020  Procedure: INSERTION PORT-A-CATH;  Surgeon: Donnie Mesa, MD;  Location: [place];  Service: General;  Laterality: N/A;   TISSUE EXPANDER PLACEMENT Bilateral 04/01/2022   Procedure: BILATERAL BREAST RECONSTRUCTION WITH PLACEMENT OF TISSUE EXPANDERS;  Surgeon: Irene Limbo, MD;  Location: [place];  Service: Plastics;  Laterality: Bilateral;   TOTAL MASTECTOMY Left  06/27/2021   Procedure: LEFT TOTAL MASTECTOMY;  Surgeon: Donnie Mesa, MD;  Location: [place];  Service: General;  Laterality: Left;     Family History  Problem Relation Age of Onset   Breast cancer Mother    Diabetes Maternal Uncle    Diabetes Maternal Grandmother    Heart disease Maternal Grandmother    Past Surgical History:  Procedure Laterality Date   APPLICATION OF A-CELL OF CHEST/ABDOMEN Left 04/01/2022   Procedure: ACELLULAR DERMIS TO LEFT CHEST;  Surgeon: Irene Limbo, MD;  Location: [place];  Service: Plastics;  Laterality: Left;   CHOLECYSTECTOMY     INCISION AND DRAINAGE OF WOUND Right 04/04/2022   Procedure: IRRIGATION AND DEBRIDEMENT OF CHEST , POSSIBLE REMOVAL OF TISSUE EXPANDER;  Surgeon: Irene Limbo, MD;  Location: [place];  Service: Plastics;  Laterality: Right;   LATISSIMUS FLAP TO BREAST Right 04/01/2022   Procedure: RIGHT LATISSIMUS DORSI FLAP TO RIGHT CHEST;  Surgeon: Irene Limbo, MD;  Location: [place];  Service: Plastics;  Laterality: Right;   MODIFIED MASTECTOMY Right 06/27/2021   Procedure: RIGHT MODIFIED RADICAL MASTECTOMY;  Surgeon: Donnie Mesa, MD;  Location: [place];  Service: General;  Laterality: Right;   PORT-A-CATH REMOVAL Right 11/05/2021   Procedure: REMOVAL PORT-A-CATH;  Surgeon: Donnie Mesa, MD;  Location: [place];  Service: General;  Laterality: Right;   PORTACATH PLACEMENT N/A 11/21/2020   Procedure: INSERTION PORT-A-CATH;  Surgeon: Donnie Mesa, MD;  Location: [place];  Service: General;  Laterality: N/A;   TISSUE EXPANDER PLACEMENT Bilateral 04/01/2022   Procedure: BILATERAL BREAST RECONSTRUCTION WITH PLACEMENT OF TISSUE EXPANDERS;  Surgeon: Irene Limbo, MD;  Location: [place];  Service: Plastics;  Laterality: Bilateral;   TOTAL MASTECTOMY Left 06/27/2021   Procedure: LEFT TOTAL MASTECTOMY;  Surgeon: Donnie Mesa, MD;  Location: [place];  Service: General;  Laterality: Left;   Current  Outpatient Medications  Medication Instructions   bisoprolol (ZEBETA) 10 MG tablet TAKE 1 TABLET BY MOUTH EVERY DAY   hydrocortisone (CORTEF) 5 MG tablet TAKE 1 TABLET BY MOUTH IN THE MORNING AND 1/2 TABLET DAILY BEFORE SUPPER.   moxifloxacin (AVELOX) 400 mg, Oral, Daily   ondansetron (ZOFRAN) 4 mg, Oral, Every 6 hours     Social History:  reports that she has never smoked. She has never used smokeless tobacco. She reports current alcohol use. She reports that she does not use drugs.  Exam: Current vital signs: BP 117/74   Pulse (!) 115   Temp 98.3 F (36.8 C) (Oral)   Resp (!) 21   Ht '5\' 4"'$  (1.626 m)   Wt 97.3 kg   LMP 10/26/2020 Comment: UPT neg DOS  SpO2 100%   BMI 36.82 kg/m  Vital signs in last 24 hours: Temp:  [98.1 F (36.7 C)-98.4 F (36.9 C)] 98.3 F (36.8 C) (11/09 2129) Pulse Rate:  [91-119] 115 (11/09 2320) Resp:  [17-26] 21 (11/09 2320) BP: (102-131)/(74-114) 117/74 (11/09 2320) SpO2:  [95 %-100 %] 100 % (11/09 2320) Weight:  [97.3 kg] 97.3 kg (11/09 2132)   Physical Exam  Constitutional: Appears well-developed and well-nourished.  Psych: Affect somewhat blas, mildly confused,  but pleasant and cooperative Eyes: No scleral injection HENT: No oropharyngeal obstruction.  MSK: no joint deformities.  Cardiovascular: Tachycardic to the 120s, regular rhythm, perfusing extremities well Respiratory: Effort normal, non-labored breathing GI: Soft.  No distension. There is no tenderness.  GU: Notably normal rectal tone and sensation Skin: Well-healing scar on the right upper back from skin grafting.  Well-healing bilateral breast implant scars.  Sacral decubitus ulcer, bleeding  Neuro: Mental Status: Patient is awake, alert, oriented to person, but not month, year, and unable to give significant details of situation. Able to repeat simple sentences but not complex sentences.  Significant word finding difficulty, unable to name husband at bedside but when asked if  he is her husband or her aunt with some hesitation she is able to say that he is her husband.  She also has significant motor apraxia, unable to copy complex finger movements, difficulty understanding motor commands Cranial Nerves: II: Visual Fields are full. Pupils are equal, round, and reactive to light.  III,IV, VI: EOMI without ptosis or diploplia.  V: Facial sensation is symmetric to temperature VII: Facial movement is symmetric.  VIII: hearing is intact to voice X: Uvula elevates symmetrically XI: Shoulder shrug is symmetric. XII: tongue is midline without atrophy or fasciculations.  Motor: Tone is normal. Bulk is normal.  Confrontational strength testing is somewhat limited by her motor apraxia as well as pain.  However she is at least 4/5 in bilateral deltoids, 5/5 elbow flexion and extension bilaterally.  Does not elevate either leg to command on hip flexion testing, but based on gait evaluation 5/5 hip flexion.  5/5 knee extension and flexion Sensory: Reports equal to light touch throughout all 4 extremities Deep Tendon Reflexes: 2+ and symmetric brachioradialis.  2+ right patellar, unable to elicit left patellar Plantars: Toes are downgoing bilaterally.  Cerebellar: She does have some tremor with finger-to-nose bilaterally.  Unable to perform heel-to-shin possibly secondary to apraxia Gait:  Sig gait gait apraxia, but she is able to repeatedly rise slightly and scoot herself up towards the head of the bed with minimal support for strength but significant support for stability  I have reviewed labs in epic and the results pertinent to this consultation are:  Basic Metabolic Panel: Recent Labs  Lab 05/21/22 2315 05/22/22 2311  NA 138 136  K 3.8 4.0  CL 108 107  CO2 21* 20*  GLUCOSE 70 98  BUN 11 11  CREATININE 0.63 0.65  CALCIUM 9.9 9.7  MG 1.5* 1.4*    CBC: Recent Labs  Lab 05/21/22 2315 05/22/22 2311  WBC 5.5 4.1  NEUTROABS 2.8 2.0  HGB 11.2* 10.6*  HCT  35.1* 32.4*  MCV 90.7 90.0  PLT 259 229    Coagulation Studies: No results for input(s): "LABPROT", "INR" in the last 72 hours.   Lab Results  Component Value Date   VITAMINB12 1,454 (H) 05/22/2022    CK 171  Lab Results  Component Value Date   TSH 0.172 (L) 05/21/2021   T3TOTAL 212 (H) 05/21/2021   T4TOTAL 5.5 04/25/2021     I have reviewed the images obtained:  Head CT personally reviewed, agree with radiology there is no acute intracranial process   Impression: Examination is concerning for motor apraxia rather than weakness.  I believe a significant component of her confusion is actually aphasia.  Given her rapid clinical decline after a viral illness, concern for an autoimmune postinfectious versus paraneoplastic encephalitis.  Due to poor oral intake I am also  checking nutritional labs.  B12 has already returned high normal.  Recommendations: - Empiric thiamine supplementation pending levels - TSH, T4 ordered - Anti-thyroid antibodies ordered - Autoimmune encephalitis serum panel ordered - Given BMI and difficult to palpate landmarks, needs LP with IR with opening pressure, oligoclonal bands, IgG index, cytopathology, protein, glucose, cell counts in tubes 1 and 4, meningitis encephalitis panel, autoimmune encephalitis panel  -LP to be ordered, CSF orders have been placed - EEG, long term video monitoring - Determine if breast expanders may be removable to facilitate MRI brain with and without contrast - Neurology will continue to follow  [place] [phone] Available 7 PM to 7 AM, outside of these hours please call Neurologist on call as listed on Amion.

## 2022-05-22 NOTE — ED Provider Triage Note (Signed)
Emergency Medicine Provider Triage Evaluation Note  Marypat Kimmet , a 46 y.o. female  was evaluated in triage.  Pt complains of bilateral leg pain and weakness.  She states the top of her left foot is hurting.  She was just discharged from ED today with diagnosis of pneumonia.  Denies lightheadedness, dizziness, syncope, chest pain, shortness of breath.  Patient's family reports she has been altered also.  EMS reports patient is A&Ox4  Review of Systems  Positive: See above Negative: See above  Physical Exam  BP 111/74 (BP Location: Right Arm)   Pulse (!) 119   Temp 98.3 F (36.8 C) (Oral)   Resp 18   Ht '5\' 4"'$  (1.626 m)   Wt 97.3 kg   LMP 10/26/2020 Comment: UPT neg DOS  SpO2 99%   BMI 36.82 kg/m  Gen:   Awake, no distress   Resp:  Normal effort  MSK:   Moves extremities without difficulty, bilateral lower extremities unremarkable   Medical Decision Making  Medically screening exam initiated at 9:45 PM.  Appropriate orders placed.  Tamisha Nordstrom was informed that the remainder of the evaluation will be completed by another provider, this initial triage assessment does not replace that evaluation, and the importance of remaining in the ED until their evaluation is complete.     Pat Kocher, [place] 05/22/22 2147

## 2022-05-22 NOTE — ED Notes (Signed)
Took pt to restroom and was in there for 10 minutes and stated she tried but could not urinate

## 2022-05-22 NOTE — ED Provider Notes (Signed)
Patient signed out to me with CT pending.  Patient seen with headache, nausea, vomiting, diarrhea that has been ongoing for 2 days.  She has not had any cough or URI symptoms.  CT scan shows no acute intra-abdominal or pelvic pathology but there is evidence of pneumonia.  With her recurrent vomiting, must consider aspiration.  Patient's initial tachycardia resolved with IV fluids.  Upon recheck she is resting comfortably.  She reports that she feels "fine".  No hypoxia.  She has not had any vomiting here in the department.  Remainder of work-up unremarkable.  Based on work-up and repeat evaluation, patient will be safe for discharge.  Will cover for possible aspiration pneumonia with Avelox (not available on formulary here, given IV Levaquin, will discharge with prescription).   Orpah Greek, MD 05/22/22 [phone]

## 2022-05-22 NOTE — ED Triage Notes (Signed)
Pt BIB EMS from home with c/o bilateral leg pain and inability to walk. Pt was discharged today from the hospital with pneumonia. Family states that pt has been slightly altered as well, stating that the wall was orange when its blue. EMS states that she is A&O x 4.

## 2022-05-23 ENCOUNTER — Encounter (HOSPITAL_COMMUNITY): Payer: Self-pay | Admitting: Internal Medicine

## 2022-05-23 ENCOUNTER — Emergency Department (HOSPITAL_COMMUNITY): Payer: No Typology Code available for payment source

## 2022-05-23 ENCOUNTER — Inpatient Hospital Stay (HOSPITAL_COMMUNITY): Payer: No Typology Code available for payment source

## 2022-05-23 DIAGNOSIS — G0481 Other encephalitis and encephalomyelitis: Secondary | ICD-10-CM | POA: Diagnosis not present

## 2022-05-23 DIAGNOSIS — R7881 Bacteremia: Secondary | ICD-10-CM | POA: Diagnosis present

## 2022-05-23 DIAGNOSIS — E669 Obesity, unspecified: Secondary | ICD-10-CM | POA: Diagnosis present

## 2022-05-23 DIAGNOSIS — T451X5D Adverse effect of antineoplastic and immunosuppressive drugs, subsequent encounter: Secondary | ICD-10-CM | POA: Diagnosis not present

## 2022-05-23 DIAGNOSIS — L89152 Pressure ulcer of sacral region, stage 2: Secondary | ICD-10-CM | POA: Diagnosis present

## 2022-05-23 DIAGNOSIS — Z8249 Family history of ischemic heart disease and other diseases of the circulatory system: Secondary | ICD-10-CM | POA: Diagnosis not present

## 2022-05-23 DIAGNOSIS — R4182 Altered mental status, unspecified: Secondary | ICD-10-CM | POA: Diagnosis not present

## 2022-05-23 DIAGNOSIS — R29898 Other symptoms and signs involving the musculoskeletal system: Secondary | ICD-10-CM

## 2022-05-23 DIAGNOSIS — Z923 Personal history of irradiation: Secondary | ICD-10-CM | POA: Diagnosis not present

## 2022-05-23 DIAGNOSIS — G9341 Metabolic encephalopathy: Secondary | ICD-10-CM | POA: Diagnosis present

## 2022-05-23 DIAGNOSIS — E860 Dehydration: Secondary | ICD-10-CM | POA: Diagnosis not present

## 2022-05-23 DIAGNOSIS — I10 Essential (primary) hypertension: Secondary | ICD-10-CM | POA: Diagnosis present

## 2022-05-23 DIAGNOSIS — Z6837 Body mass index (BMI) 37.0-37.9, adult: Secondary | ICD-10-CM | POA: Diagnosis not present

## 2022-05-23 DIAGNOSIS — Z853 Personal history of malignant neoplasm of breast: Secondary | ICD-10-CM | POA: Diagnosis not present

## 2022-05-23 DIAGNOSIS — Z888 Allergy status to other drugs, medicaments and biological substances status: Secondary | ICD-10-CM | POA: Diagnosis not present

## 2022-05-23 DIAGNOSIS — Z880 Allergy status to penicillin: Secondary | ICD-10-CM | POA: Diagnosis not present

## 2022-05-23 DIAGNOSIS — I429 Cardiomyopathy, unspecified: Secondary | ICD-10-CM | POA: Diagnosis present

## 2022-05-23 DIAGNOSIS — R112 Nausea with vomiting, unspecified: Secondary | ICD-10-CM | POA: Diagnosis present

## 2022-05-23 DIAGNOSIS — Z79899 Other long term (current) drug therapy: Secondary | ICD-10-CM | POA: Diagnosis not present

## 2022-05-23 DIAGNOSIS — G9340 Encephalopathy, unspecified: Secondary | ICD-10-CM

## 2022-05-23 DIAGNOSIS — Z833 Family history of diabetes mellitus: Secondary | ICD-10-CM | POA: Diagnosis not present

## 2022-05-23 DIAGNOSIS — R443 Hallucinations, unspecified: Secondary | ICD-10-CM | POA: Diagnosis present

## 2022-05-23 DIAGNOSIS — R410 Disorientation, unspecified: Secondary | ICD-10-CM | POA: Insufficient documentation

## 2022-05-23 DIAGNOSIS — Z9013 Acquired absence of bilateral breasts and nipples: Secondary | ICD-10-CM | POA: Diagnosis not present

## 2022-05-23 DIAGNOSIS — C50919 Malignant neoplasm of unspecified site of unspecified female breast: Secondary | ICD-10-CM | POA: Diagnosis not present

## 2022-05-23 DIAGNOSIS — Z9221 Personal history of antineoplastic chemotherapy: Secondary | ICD-10-CM | POA: Diagnosis not present

## 2022-05-23 DIAGNOSIS — G131 Other systemic atrophy primarily affecting central nervous system in neoplastic disease: Secondary | ICD-10-CM | POA: Diagnosis not present

## 2022-05-23 DIAGNOSIS — G4089 Other seizures: Secondary | ICD-10-CM | POA: Diagnosis not present

## 2022-05-23 DIAGNOSIS — E876 Hypokalemia: Secondary | ICD-10-CM | POA: Diagnosis not present

## 2022-05-23 DIAGNOSIS — G620 Drug-induced polyneuropathy: Secondary | ICD-10-CM | POA: Diagnosis present

## 2022-05-23 DIAGNOSIS — J189 Pneumonia, unspecified organism: Secondary | ICD-10-CM | POA: Diagnosis present

## 2022-05-23 DIAGNOSIS — Z1152 Encounter for screening for COVID-19: Secondary | ICD-10-CM | POA: Diagnosis not present

## 2022-05-23 LAB — BLOOD CULTURE ID PANEL (REFLEXED) - BCID2

## 2022-05-23 LAB — CSF CELL COUNT WITH DIFFERENTIAL
RBC Count, CSF: 102 /mm3 — ABNORMAL HIGH
RBC Count, CSF: 159 /mm3 — ABNORMAL HIGH
Tube #: 1
Tube #: 4
WBC, CSF: 2 /mm3 (ref 0–5)
WBC, CSF: 3 /mm3 (ref 0–5)

## 2022-05-23 LAB — MENINGITIS/ENCEPHALITIS PANEL (CSF)

## 2022-05-23 LAB — CBC
HCT: 32.8 % — ABNORMAL LOW (ref 36.0–46.0)
Hemoglobin: 10.8 g/dL — ABNORMAL LOW (ref 12.0–15.0)
MCH: 29.8 pg (ref 26.0–34.0)
MCHC: 32.9 g/dL (ref 30.0–36.0)
MCV: 90.6 fL (ref 80.0–100.0)
Platelets: 230 10*3/uL (ref 150–400)
RBC: 3.62 MIL/uL — ABNORMAL LOW (ref 3.87–5.11)
RDW: 13.7 % (ref 11.5–15.5)
WBC: 4.4 10*3/uL (ref 4.0–10.5)
nRBC: 0 % (ref 0.0–0.2)

## 2022-05-23 LAB — BASIC METABOLIC PANEL
Anion gap: 12 (ref 5–15)
BUN: 9 mg/dL (ref 6–20)
CO2: 17 mmol/L — ABNORMAL LOW (ref 22–32)
Calcium: 9.6 mg/dL (ref 8.9–10.3)
Chloride: 107 mmol/L (ref 98–111)
Creatinine, Ser: 0.72 mg/dL (ref 0.44–1.00)
GFR, Estimated: >60 mL/min (ref >60)
Glucose, Bld: 110 mg/dL — ABNORMAL HIGH (ref 70–99)
Potassium: 3.9 mmol/L (ref 3.5–5.1)
Sodium: 136 mmol/L (ref 135–145)

## 2022-05-23 LAB — PROCALCITONIN: Procalcitonin: <0.1 ng/mL

## 2022-05-23 LAB — CK: Total CK: 171 U/L (ref 38–234)

## 2022-05-23 LAB — T4, FREE: Free T4: 0.75 ng/dL (ref 0.61–1.12)

## 2022-05-23 LAB — HIV ANTIBODY (ROUTINE TESTING W REFLEX): HIV Screen 4th Generation wRfx: NONREACTIVE

## 2022-05-23 LAB — MAGNESIUM: Magnesium: 1.4 mg/dL — ABNORMAL LOW (ref 1.7–2.4)

## 2022-05-23 LAB — SARS CORONAVIRUS 2 BY RT PCR: SARS Coronavirus 2 by RT PCR: NEGATIVE

## 2022-05-23 LAB — TROPONIN I (HIGH SENSITIVITY): Troponin I (High Sensitivity): 13 ng/L (ref <18)

## 2022-05-23 LAB — VITAMIN B12: Vitamin B-12: 1454 pg/mL — ABNORMAL HIGH (ref 180–914)

## 2022-05-23 LAB — PROTEIN AND GLUCOSE, CSF
Glucose, CSF: 51 mg/dL (ref 40–70)
Total  Protein, CSF: 119 mg/dL — ABNORMAL HIGH (ref 15–45)

## 2022-05-23 LAB — TSH: TSH: 1.486 u[IU]/mL (ref 0.350–4.500)

## 2022-05-23 MED ORDER — METOPROLOL TARTRATE 5 MG/5ML IV SOLN
2.5000 mg | Freq: Three times a day (TID) | INTRAVENOUS | Status: DC
  Administered 2022-05-23 – 2022-05-24 (×3): 2.5 mg via INTRAVENOUS
  Filled 2022-05-23 (×3): qty 5

## 2022-05-23 MED ORDER — LACTATED RINGERS IV BOLUS
1000.0000 mL | Freq: Once | INTRAVENOUS | Status: AC
  Administered 2022-05-23: 1000 mL via INTRAVENOUS

## 2022-05-23 MED ORDER — ACETAMINOPHEN 325 MG PO TABS
650.0000 mg | ORAL_TABLET | Freq: Four times a day (QID) | ORAL | Status: DC | PRN
  Administered 2022-05-24: 650 mg via ORAL
  Filled 2022-05-23: qty 2

## 2022-05-23 MED ORDER — PANTOPRAZOLE SODIUM 40 MG IV SOLR
40.0000 mg | INTRAVENOUS | Status: AC
  Administered 2022-05-23 – 2022-05-27 (×5): 40 mg via INTRAVENOUS
  Filled 2022-05-23 (×5): qty 10

## 2022-05-23 MED ORDER — THIAMINE HCL 100 MG/ML IJ SOLN
500.0000 mg | Freq: Three times a day (TID) | INTRAVENOUS | Status: AC
  Administered 2022-05-23 – 2022-05-25 (×9): 500 mg via INTRAVENOUS
  Filled 2022-05-23 (×11): qty 5

## 2022-05-23 MED ORDER — BISOPROLOL FUMARATE 10 MG PO TABS: 10.0000 mg | ORAL_TABLET | Freq: Every day | ORAL | Status: DC

## 2022-05-23 MED ORDER — THIAMINE HCL 100 MG/ML IJ SOLN
100.0000 mg | INTRAMUSCULAR | Status: DC
  Administered 2022-05-26 – 2022-05-28 (×3): 100 mg via INTRAVENOUS
  Filled 2022-05-23 (×3): qty 2

## 2022-05-23 MED ORDER — LIDOCAINE HCL (PF) 1 % IJ SOLN
5.0000 mL | Freq: Once | INTRAMUSCULAR | Status: AC
  Administered 2022-05-23: 5 mL via INTRADERMAL

## 2022-05-23 MED ORDER — MAGNESIUM SULFATE 2 GM/50ML IV SOLN
2.0000 g | Freq: Once | INTRAVENOUS | Status: AC
  Administered 2022-05-23: 2 g via INTRAVENOUS
  Filled 2022-05-23: qty 50

## 2022-05-23 MED ORDER — FENTANYL CITRATE PF 50 MCG/ML IJ SOSY
50.0000 ug | PREFILLED_SYRINGE | Freq: Once | INTRAMUSCULAR | Status: AC
  Administered 2022-05-23: 50 ug via INTRAVENOUS
  Filled 2022-05-23: qty 1

## 2022-05-23 MED ORDER — ACETAMINOPHEN 650 MG RE SUPP: 650.0000 mg | Freq: Four times a day (QID) | RECTAL | Status: DC | PRN

## 2022-05-23 MED ORDER — SODIUM CHLORIDE 0.9 % IV SOLN
1000.0000 mg | INTRAVENOUS | Status: AC
  Administered 2022-05-23 – 2022-05-27 (×5): 1000 mg via INTRAVENOUS
  Filled 2022-05-23 (×5): qty 16

## 2022-05-23 MED ORDER — LACTATED RINGERS IV SOLN: INTRAVENOUS | Status: AC

## 2022-05-23 MED ORDER — SODIUM CHLORIDE 0.9 % IV SOLN: INTRAVENOUS | Status: DC

## 2022-05-23 MED ORDER — HYDROCORTISONE SOD SUC (PF) 100 MG IJ SOLR: 50.0000 mg | Freq: Three times a day (TID) | INTRAMUSCULAR | Status: DC

## 2022-05-23 MED ORDER — SODIUM CHLORIDE 0.9 % IV BOLUS
500.0000 mL | Freq: Once | INTRAVENOUS | Status: AC
  Administered 2022-05-23: 500 mL via INTRAVENOUS

## 2022-05-23 NOTE — Progress Notes (Signed)
No charge note Patient admitted already this morning, detail please see HPI.  Patient is seen and examined, she is pleasantly confused.  Confusion and weakness of unclear etiology, under work-up, case discussed with neurology.

## 2022-05-23 NOTE — Progress Notes (Signed)
vLTM maintenance  all impedances below 10k. No skin breakdown noted at all skin sites.  Moved pt to ED-11

## 2022-05-23 NOTE — Progress Notes (Signed)
LTM EEG hooked up and running - no initial skin breakdown - push button tested - Patient is currently in the ED. No [facility] at this time.

## 2022-05-23 NOTE — ED Notes (Signed)
Lab to run add on labs

## 2022-05-23 NOTE — ED Notes (Signed)
Pt arrived to rm 32 - Tx from [facility] via [facility]. Pt sent for MRI scan d/t possible metastasis. Pt is in remission from breast cancer. Recently seen and dx with pneumonia. Pt states when she got home she was unable to walk and increased AMS. Pt Aox4 now. Initially pain 10/10 in legs, now 6/10 after pain medicine.  Pt right arm is restricted but IV from prev hospital is in right side.

## 2022-05-23 NOTE — ED Notes (Signed)
Dr. Rathore at bedside 

## 2022-05-23 NOTE — H&P (Signed)
History and Physical    Amber White LGX:[id number] DOB: 11-06-1975 DOA: 05/22/2022  PCP: [name], MD  Patient coming from: Home  Chief Complaint: Unable to walk, confusion  HPI: Amber White is a 47 y.o. female with medical history significant of high-grade right breast invasive ductal carcinoma diagnosed in 10/2020 treated with chemotherapy, radiation, bilateral mastectomies, and maintenance pembrolizumab.  She underwent breast reconstructive surgery in 03/2022.  Also has history of hypertension, adrenal insufficiency, cardiomyopathy, and obesity.  She presented to the ED 2 days ago for vomiting, diarrhea, and headache.  CT abdomen pelvis was negative for acute intraabdominal or pelvic pathology but was concerning for left lung base pneumonia.  She was given a dose of Levaquin and discharged with prescription for moxifloxacin.  She returned to the ED yesterday evening with bilateral proximal muscle weakness/inability to walk, confusion, and aphasia.  Patient persistently tachycardic in the ED with heart rate 100-110s.  Afebrile and not hypotensive.  Not hypoxic.  Labs showing no leukocytosis, hemoglobin 10.6 (no significant change from baseline), magnesium 1.4, troponin negative, CK normal, B12 level high normal, ammonia level <10.  CT head negative for acute finding. Given her rapid clinical decline after a viral illness, neurology concerned for an autoimmune postinfectious versus paraneoplastic encephalitis.  Patient unable to get MRI due to breast tissue expanders.  She was given fentanyl, IV Solu-Cortef 100 mg, IV thiamine 500 mg, and 1 L LR bolus.  TRH called to admit.  Patient is very confused and disoriented.  Not able to give any history.  She thinks she is at a church.  When asked about the current year, she responded by saying it is the 73s.  She is hallucinating, constantly pointing to the curtain in the room and referring to it as a refrigerator.  Review of Systems:  Review of  Systems  All other systems reviewed and are negative.   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    Past Surgical History:  Procedure Laterality Date   APPLICATION OF A-CELL OF CHEST/ABDOMEN Left 04/01/2022   Procedure: ACELLULAR DERMIS TO LEFT CHEST;  Surgeon: Irene Limbo, MD;  Location: [place];  Service: Plastics;  Laterality: Left;   CHOLECYSTECTOMY     INCISION AND DRAINAGE OF WOUND Right 04/04/2022   Procedure: IRRIGATION AND DEBRIDEMENT OF CHEST , POSSIBLE REMOVAL OF TISSUE EXPANDER;  Surgeon: Irene Limbo, MD;  Location: [place];  Service: Plastics;  Laterality: Right;   LATISSIMUS FLAP TO BREAST Right 04/01/2022   Procedure: RIGHT LATISSIMUS DORSI FLAP TO RIGHT CHEST;  Surgeon: Irene Limbo, MD;  Location: [place];  Service: Plastics;  Laterality: Right;   MODIFIED MASTECTOMY Right 06/27/2021   Procedure: RIGHT MODIFIED RADICAL MASTECTOMY;  Surgeon: Donnie Mesa, MD;  Location: [place];  Service: General;  Laterality: Right;   PORT-A-CATH REMOVAL Right 11/05/2021   Procedure: REMOVAL PORT-A-CATH;  Surgeon: Donnie Mesa, MD;  Location: [place];  Service: General;  Laterality: Right;   PORTACATH PLACEMENT N/A 11/21/2020   Procedure: INSERTION PORT-A-CATH;  Surgeon: Donnie Mesa, MD;  Location:  [place];  Service: General;  Laterality: N/A;   TISSUE EXPANDER PLACEMENT Bilateral 04/01/2022   Procedure: BILATERAL BREAST RECONSTRUCTION WITH PLACEMENT OF TISSUE EXPANDERS;  Surgeon: Irene Limbo, MD;  Location: [place];  Service: Plastics;  Laterality: Bilateral;   TOTAL  MASTECTOMY Left 06/27/2021   Procedure: LEFT TOTAL MASTECTOMY;  Surgeon: Donnie Mesa, MD;  Location: [place];  Service: General;  Laterality: Left;     reports that she has never smoked. She has never used smokeless tobacco. She reports current alcohol use. She reports that she does not use drugs.  Allergies  Allergen Reactions   Paclitaxel Nausea Only and Other (See Comments)    Complained of chest burning and nausea. Resolved with famotidine, methylprednisolone, and diphenhydramine. Resumed paclitaxel and completed without further incident.    Penicillins Hives    Family History  Problem Relation Age of Onset   Breast cancer Mother    Diabetes Maternal Uncle    Diabetes Maternal Grandmother    Heart disease Maternal Grandmother     Prior to Admission medications   Medication Sig Start Date End Date Taking? Authorizing Provider  moxifloxacin (AVELOX) 400 MG tablet Take 1 tablet (400 mg total) by mouth daily at 6 (six) AM. 05/22/22  Yes Pollina, Gwenyth Allegra, MD  ondansetron (ZOFRAN) 4 MG tablet Take 1 tablet (4 mg total) by mouth every 6 (six) hours. 05/22/22  Yes Pollina, Gwenyth Allegra, MD  bisoprolol (ZEBETA) 10 MG tablet TAKE 1 TABLET BY MOUTH EVERY DAY Patient not taking: Reported on 05/22/2022 01/02/22   Nicholas Lose, MD  hydrocortisone (CORTEF) 5 MG tablet TAKE 1 TABLET BY MOUTH IN THE MORNING AND 1/2 TABLET DAILY BEFORE SUPPER. Patient not taking: Reported on 05/22/2022 05/16/22   Nicholas Lose, MD  prochlorperazine (COMPAZINE) 10 MG tablet Take 1 tablet (10 mg total) by mouth every 6 (six) hours as needed (Nausea or vomiting). 11/07/20 04/25/21  Nicholas Lose, MD    Physical Exam: Vitals:   05/23/22 0338 05/23/22 0445 05/23/22 0500 05/23/22 0515  BP:  123/80 129/87 129/88  Pulse: (!) 109 (!) 105 (!) 110 (!) 110  Resp: (!) 24 (!) '8 20 20  '$ Temp:      TempSrc:      SpO2: 100% 98% 99% 98%  Weight:      Height:        Physical Exam Vitals reviewed.  Constitutional:       General: She is not in acute distress. HENT:     Head: Normocephalic and atraumatic.  Eyes:     Extraocular Movements: Extraocular movements intact.  Cardiovascular:     Rate and Rhythm: Normal rate and regular rhythm.     Pulses: Normal pulses.  Pulmonary:     Effort: Pulmonary effort is normal. No respiratory distress.     Breath sounds: No wheezing or rales.  Abdominal:     General: Bowel sounds are normal. There is no distension.     Palpations: Abdomen is soft.     Tenderness: There is no abdominal tenderness.  Musculoskeletal:     Cervical back: Normal range of motion.     Right lower leg: No edema.     Left lower leg: No edema.  Skin:    General: Skin is warm and dry.  Neurological:     Mental Status: She is alert.     Cranial Nerves: No cranial nerve deficit.     Sensory: No sensory deficit.     Comments: Very confused and  disoriented Strength 4 out of 5 in bilateral upper and lower extremities     Labs on Admission: I have personally reviewed following labs and imaging studies  CBC: Recent Labs  Lab 05/21/22 2315 05/22/22 2311  WBC 5.5 4.1  NEUTROABS 2.8 2.0  HGB 11.2* 10.6*  HCT 35.1* 32.4*  MCV 90.7 90.0  PLT 259 761   Basic Metabolic Panel: Recent Labs  Lab 05/21/22 2315 05/22/22 2311  NA 138 136  K 3.8 4.0  CL 108 107  CO2 21* 20*  GLUCOSE 70 98  BUN 11 11  CREATININE 0.63 0.65  CALCIUM 9.9 9.7  MG 1.5* 1.4*   GFR: Estimated Creatinine Clearance: 97.3 mL/min (by C-G formula based on SCr of 0.65 mg/dL). Liver Function Tests: Recent Labs  Lab 05/21/22 2315 05/22/22 2311  AST 26 24  ALT 16 13  ALKPHOS 44 42  BILITOT 0.9 0.7  PROT 7.8 8.0  ALBUMIN 3.7 3.6   No results for input(s): "LIPASE", "AMYLASE" in the last 168 hours. Recent Labs  Lab 05/22/22 2311  AMMONIA <10   Coagulation Profile: No results for input(s): "INR", "PROTIME" in the last 168 hours. Cardiac Enzymes: Recent Labs  Lab 05/22/22 2311  CKTOTAL 171   BNP  (last 3 results) No results for input(s): "PROBNP" in the last 8760 hours. HbA1C: No results for input(s): "HGBA1C" in the last 72 hours. CBG: Recent Labs  Lab 05/21/22 1959 05/21/22 2111  GLUCAP 90 71   Lipid Profile: No results for input(s): "CHOL", "HDL", "LDLCALC", "TRIG", "CHOLHDL", "LDLDIRECT" in the last 72 hours. Thyroid Function Tests: No results for input(s): "TSH", "T4TOTAL", "FREET4", "T3FREE", "THYROIDAB" in the last 72 hours. Anemia Panel: Recent Labs    05/22/22 2311  VITAMINB12 1,454*   Urine analysis:    Component Value Date/Time   COLORURINE YELLOW 05/22/2022 0513   APPEARANCEUR CLEAR 05/22/2022 0513   LABSPEC >1.046 (H) 05/22/2022 0513   PHURINE 5.0 05/22/2022 0513   GLUCOSEU NEGATIVE 05/22/2022 0513   HGBUR NEGATIVE 05/22/2022 0513   BILIRUBINUR NEGATIVE 05/22/2022 0513   KETONESUR 80 (A) 05/22/2022 0513   PROTEINUR NEGATIVE 05/22/2022 0513   UROBILINOGEN 0.2 04/09/2014 2230   NITRITE NEGATIVE 05/22/2022 0513   LEUKOCYTESUR TRACE (A) 05/22/2022 0513    Radiological Exams on Admission: CT Head Wo Contrast  Result Date: 05/22/2022 CLINICAL DATA:  Dizziness, persistent/recurrent, cardiac or vascular cause suspected Hospital discharge today. EXAM: CT HEAD WITHOUT CONTRAST TECHNIQUE: Contiguous axial images were obtained from the base of the skull through the vertex without intravenous contrast. RADIATION DOSE REDUCTION: This exam was performed according to the departmental dose-optimization program which includes automated exposure control, adjustment of the mA and/or kV according to patient size and/or use of iterative reconstruction technique. COMPARISON:  Brain MRI 05/24/2021 FINDINGS: Brain: No evidence of acute infarction, hemorrhage, hydrocephalus, extra-axial collection or mass lesion/mass effect. Vascular: No hyperdense vessel or unexpected calcification. Skull: Normal. Negative for fracture or focal lesion. Sinuses/Orbits: Minimal mucosal thickening  of scattered ethmoid air cells. No mastoid effusion. Unremarkable orbits. Other: None. IMPRESSION: No acute intracranial abnormality. Electronically Signed   By: Keith Rake M.D.   On: 05/22/2022 23:18   CT ABDOMEN PELVIS W CONTRAST  Result Date: 05/22/2022 CLINICAL DATA:  Abdominal pain, nausea/vomiting EXAM: CT ABDOMEN AND PELVIS WITH CONTRAST TECHNIQUE: Multidetector CT imaging of the abdomen and pelvis was performed using the standard protocol following bolus administration of intravenous contrast. RADIATION DOSE REDUCTION: This exam was performed according to the departmental dose-optimization program which  includes automated exposure control, adjustment of the mA and/or kV according to patient size and/or use of iterative reconstruction technique. CONTRAST:  163m OMNIPAQUE IOHEXOL 300 MG/ML  SOLN COMPARISON:  11/14/2020 FINDINGS: Lower chest: Focal patchy opacity the left lung base, suspicious for pneumonia. Hepatobiliary: Liver is within normal limits. Status post cholecystectomy. No intrahepatic or extrahepatic ductal dilatation. Pancreas: Within normal limits. Spleen: Within normal limits Adrenals/Urinary Tract: Adrenal glands are within normal limits. Kidneys are normal limits.  No hydronephrosis. Bladder is within normal limits. Stomach/Bowel: Stomach is within normal limits. No evidence of bowel obstruction. Normal appendix (series 2/image 1). No colonic wall thickening or inflammatory changes. Vascular/Lymphatic: No evidence of abdominal aortic aneurysm. No suspicious abdominopelvic lymphadenopathy. Reproductive: Uterus is within normal limits. No adnexal masses. Other: No abdominopelvic ascites. Musculoskeletal: Visualized osseous structures are within normal limits. IMPRESSION: Focal patchy opacity at the left lung base, suspicious for pneumonia. Otherwise negative CT abdomen/pelvis. Electronically Signed   By: SJulian HyM.D.   On: 05/22/2022 02:58   DG Chest Port 1 View  Result  Date: 05/21/2022 CLINICAL DATA:  Weakness EXAM: PORTABLE CHEST 1 VIEW COMPARISON:  09/09/2021 FINDINGS: Lungs are clear.  No pleural effusion or pneumothorax. The heart is normal in size. IMPRESSION: No evidence of acute cardiopulmonary disease. Electronically Signed   By: SJulian HyM.D.   On: 05/21/2022 21:40    Assessment and Plan  Acute onset bilateral upper and lower extremity weakness, confusion, hallucinations CT head negative for acute finding.  Unable to get MRIs brain/C-spine due to breast tissue expanders.  Given her rapid clinical decline after a viral illness, neurology concerned for an autoimmune postinfectious versus paraneoplastic encephalitis.  Neurology placed the following orders: -Empiric high-dose IV thiamine supplementation pending levels -TSH, free T4 -Antithyroid antibodies -Autoimmune encephalitis serum panel -LP with IR with opening pressure, oligoclonal bands, IgG index, cytopathology, protein, glucose, cell count and tubes 1 and 4, meningitis encephalitis panel, autoimmune encephalitis panel -EEG, long-term video monitoring  Hypomagnesemia -Replace magnesium and continue to monitor -Stat EKG ordered  ?Aspiration pneumonia CT abdomen pelvis was showing focal patchy opacity at the left lung base.  ?Aspiration from previous vomiting.  No fever or leukocytosis.  Not hypoxic or tachypneic. -Check procalcitonin, continue antibiotics if elevated -COVID test  Mild tachycardia No fever or leukocytosis to suggest sepsis.  No hypoxia to suggest PE. -Cardiac monitoring -EKG -Continue IV fluids and monitor heart rate closely -Thyroid function tests  History of breast cancer No longer on treatment. -Outpatient oncology follow-up  Hypertension Stable.  Per pharmacy med rec, she is not taking any antihypertensives. -Monitor blood pressure closely  History of adrenal insufficiency In the setting of previous cancer treatment.  Per pharmacy med rec, she is no  longer on steroids. -Check cortisol and ACTH levels  DVT prophylaxis: SCDs Code Status: Full Code Family Communication: No family available at this time. Consults called: Neurology Level of care: Telemetry bed Admission status: It is my clinical opinion that admission to INPATIENT is reasonable and necessary because of the expectation that this patient will require hospital care that crosses at least 2 midnights to treat this condition based on the medical complexity of the problems presented.  Given the aforementioned information, the predictability of an adverse outcome is felt to be significant.   VShela LeffMD [facility]  If 7PM-7AM, please contact night-coverage [email or web address]  05/23/2022, 5:33 AM

## 2022-05-23 NOTE — ED Provider Notes (Signed)
  Physical Exam  BP 117/74   Pulse (!) 115   Temp 98.3 F (36.8 C) (Oral)   Resp (!) 21   Ht '5\' 4"'$  (1.626 m)   Wt 97.3 kg   LMP 10/26/2020 Comment: UPT neg DOS  SpO2 100%   BMI 36.82 kg/m   Physical Exam Vitals and nursing note reviewed.  Constitutional:      General: She is not in acute distress.    Appearance: She is well-developed. She is ill-appearing.  HENT:     Head: Normocephalic and atraumatic.  Eyes:     Conjunctiva/sclera: Conjunctivae normal.  Cardiovascular:     Rate and Rhythm: Normal rate and regular rhythm.     Heart sounds: No murmur heard. Pulmonary:     Effort: Pulmonary effort is normal. No respiratory distress.     Breath sounds: Normal breath sounds.  Abdominal:     Palpations: Abdomen is soft.     Tenderness: There is no abdominal tenderness.  Musculoskeletal:        General: No swelling.     Cervical back: Neck supple.  Skin:    General: Skin is warm and dry.     Capillary Refill: Capillary refill takes less than 2 seconds.     Coloration: Skin is pale.  Neurological:     Mental Status: She is alert. She is disoriented.     Motor: Weakness present.     Coordination: Coordination abnormal.  Psychiatric:        Mood and Affect: Mood normal.     Procedures  Procedures  ED Course / MDM    Medical Decision Making Amount and/or Complexity of Data Reviewed Labs: ordered. Radiology: ordered.  Risk Prescription drug management.   Patient received in handoff.  Generalized weakness and need for MRI.  I reevaluated the patient at bedside and her neuro exam is very concerning.  She has severe proximal muscle weakness with inability to raise either right or left upper or lower extremity off of the bed.  She appears to have strength distal to the elbow bilaterally of the upper extremities but cannot raise at the elbow.  I have high concern for a compressive C-spine lesion or a midbrain lesion.  Apparently MRI orders were placed at 10:30 PM but MRI  staff was unable to obtain the stat images and would not stay past midnight to have these images done at this facility.  I did receive clarification from the family and the patient's husband states that this neuro exam is exactly how she has looked over the last 48 hours and has not been rapidly developing here in the emergency department.  As we are apparently unable to get MRI done here at [facility], I spoke with the neurologist on-call Dr. Curly Shores who agrees with contrasted brain and C-spine imaging via MRI and I arrange for emergent transfer to [facility] for stat MRI.  Patient excepted by Dr. Thayer Dallas, Debe Coder, MD 05/23/22 [phone]

## 2022-05-23 NOTE — Progress Notes (Signed)
Neurology Progress Note  Brief HPI: 46 year old patient with history of breast cancer status post chemo and radiation now undergoing breast reconstructive surgery, renal insufficiency, obesity, cardiomyopathy and peripheral neuropathy presents with confusion and motor apraxia.  Over last weekend, she had nausea, vomiting and diarrhea and felt like she had a viral infection.  On Thursday, she was evaluated in the ED with concerns for pneumonia on CT chest.  After discharge from the ED she began to have hallucinations and had difficulty ambulating.  Patient is now oriented to person and place, but begins talking about furniture when asked if she understands why she is in the hospital.    Subjective: Patient's vital signs are stable, but she continues to be confused.  Long-term EEG in place.  Exam: Vitals:   05/23/22 0914 05/23/22 1000  BP:  (!) 120/90  Pulse:  (!) 118  Resp:  19  Temp: 98.1 F (36.7 C)   SpO2:  98%   Gen: In bed, NAD Resp: non-labored breathing, no acute distress  Neuro: Mental Status: Alert and oriented to person and place but not time or situation.  Pleasant and cooperative with exam.  Speech is clear, fluent but veers off course and sentence structure is preserved but not making sense. Get stuck on words from time to time. Has mild aphasia but no dysarthria noted Cranial Nerves: Pupils equal round and reactive, extraocular movements intact, facial sensation symmetric, hearing intact, shoulder shrug symmetric, phonation normal, tongue midline Motor: Tone and bulk are normal, however strength testing is limited due to pain in bilateral thighs.  Patient is unable to extend bilateral arms but has good grip strength and is able to lift forearms off of the bed.  Again does not lift legs from the bed but has strong dorsiflexion and plantarflexion noted. Was able to walk for our team yesterday. Sensory: Intact to light touch in all 4 extremities DTR: Unable to elicit Gait:  Deferred  Pertinent Labs:    Latest Ref Rng & Units 05/23/2022    4:41 AM 05/22/2022   11:11 PM 05/21/2022   11:15 PM  CBC  WBC 4.0 - 10.5 K/uL 4.4  4.1  5.5   Hemoglobin 12.0 - 15.0 g/dL 10.8  10.6  11.2   Hematocrit 36.0 - 46.0 % 32.8  32.4  35.1   Platelets 150 - 400 K/uL 230  229  259        Latest Ref Rng & Units 05/23/2022    4:41 AM 05/22/2022   11:11 PM 05/21/2022   11:15 PM  BMP  Glucose 70 - 99 mg/dL 110  98  70   BUN 6 - 20 mg/dL '9  11  11   '$ Creatinine 0.44 - 1.00 mg/dL 0.72  0.65  0.63   Sodium 135 - 145 mmol/L 136  136  138   Potassium 3.5 - 5.1 mmol/L 3.9  4.0  3.8   Chloride 98 - 111 mmol/L 107  107  108   CO2 22 - 32 mmol/L '17  20  21   '$ Calcium 8.9 - 10.3 mg/dL 9.6  9.7  9.9   TSH 1.486 T4 0.75  Autoimmune encephalitis panel pending HIV pending Thyroid antibodies pending MMA pending Thiamine pending B12 1454 Ammonia less than 10 LP with no pleocytosis, protein elevated to 119, meningitis panel negative.  Imaging Reviewed:  CT head: No acute intracranial abnormality  Unable to perform MRI due to incompatible breast tissue expanders  Assessment: 46 year old patient with history of breast  cancer status postchemotherapy and radiation, renal insufficiency, cardiomyopathy and peripheral neuropathy presents with acute onset confusion and motor apraxia after a viral illness over the weekend.  On exam, patient is oriented to self and place only confused regarding time and situation.  Strength testing is limited due to pain, but patient has good antigravity strength in all 4 extremities. CTH negative. EEG shows no seizure activity so far. LP with no pleocytosis, elevated protein but not concerning for infection.  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  Recommendations: 1) continue thiamine supplementation 2) lumbar puncture under fluoroscopy 3) discontinue EEG if no seizure activity seen today 4) consider  high-dose IV methylprednisolone after lumbar puncture  [place] , MSN, AGACNP-BC [facility] See Amion for schedule and pager information 05/23/2022 10:48 AM   NEUROHOSPITALIST ADDENDUM Performed a face to face diagnostic evaluation.   I have reviewed the contents of history and physical exam as documented by PA/ARNP/Resident and agree with above documentation.  I have discussed and formulated the above plan as documented. Edits to the note have been made as needed.  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 Veers off topic, keeps talking with preserved sentence structure but not making a whole lot of sense. She has poor recollection of what happened all of last week. Symptoms started a few days after viral GI illness with nausea, vomiting, diarrhea. Came to the ED on 11/8 but discharged on levaquin for pneumonia noted on . Returned for inability to walk and confusion the next day. Unable to get MRI Brain due to breast implant extenders. LTM EEG with no seizures so far. LP with no pleocytosis, negative meningitis/encephalitis panel, elevated protein to 119.  - Overall, high suspicion for a paraneoplastic process. - She had CT Abdomen/Pelvis which was negative for occult malignancy, will get CT chest to look for occult malignancy in chest. - Discussed with patient's mom Ms. Blackwell about steroids. Will start IV solumedrol '1000mg'$  daily x 5 days. PPI while on steroids.   Donnetta Simpers, MD [facility] [phone]   If 7pm to 7am, please call on call as listed on AMION.

## 2022-05-23 NOTE — ED Notes (Signed)
Assisted neurologist in turning pt - pt has decub ulcer on bottom - dressing to be placed

## 2022-05-23 NOTE — ED Notes (Signed)
Per MRI tech pt cannot get MRI d/t tissue expanders - MD H[place] Notified

## 2022-05-23 NOTE — ED Notes (Signed)
Neurologist at bedside. 

## 2022-05-23 NOTE — Procedures (Signed)
Patient Name: Amber White  MRN: [id number]  Epilepsy Attending: Lora Havens  Referring Physician/Provider: Lorenza Chick, MD  Duration: 05/23/2022 0453 to 05/24/2022 0453  Patient history: 46 year old female with acute onset bilateral upper and lower extremity weakness, confusion, hallucinations.  EEG to evaluate for seizure.  Level of alertness: Awake, asleep  AEDs during EEG study: None  Technical aspects: This EEG study was done with scalp electrodes positioned according to the 10-20 International system of electrode placement. Electrical activity was reviewed with band pass filter of 1-'70Hz'$ , sensitivity of 7 uV/mm, display speed of 50m/sec with a '60Hz'$  notched filter applied as appropriate. EEG data were recorded continuously and digitally stored.  Video monitoring was available and reviewed as appropriate.  Description: The posterior dominant rhythm consists of 8 Hz activity of moderate voltage (25-35 uV) seen predominantly in posterior head regions, symmetric and reactive to eye opening and eye closing. Sleep was characterized by vertex waves, sleep spindles (12 to 14 Hz), maximal frontocentral region. EEG showed continuous generalized polymorphic 3 to 6 Hz theta-delta slowing. Hyperventilation and photic stimulation were not performed.     ABNORMALITY - Continuous slow, generalized  IMPRESSION: This study is suggestive of moderate diffuse encephalopathy, nonspecific etiology. No seizures or epileptiform discharges were seen throughout the recording.  [name] OBarbra Sarks

## 2022-05-23 NOTE — ED Notes (Signed)
Mepilex placed on pt bottom at this time

## 2022-05-23 NOTE — ED Notes (Signed)
Pt contraindicated for MRI due to tissue expanders

## 2022-05-23 NOTE — ED Notes (Signed)
Pt to IR for LP. Will administer 1400 meds after pt returns.

## 2022-05-23 NOTE — ED Notes (Signed)
MD notified of bladder scan  

## 2022-05-24 ENCOUNTER — Inpatient Hospital Stay (HOSPITAL_COMMUNITY): Payer: No Typology Code available for payment source

## 2022-05-24 DIAGNOSIS — G0481 Other encephalitis and encephalomyelitis: Secondary | ICD-10-CM

## 2022-05-24 DIAGNOSIS — R4182 Altered mental status, unspecified: Secondary | ICD-10-CM | POA: Diagnosis not present

## 2022-05-24 DIAGNOSIS — L8990 Pressure ulcer of unspecified site, unspecified stage: Secondary | ICD-10-CM | POA: Insufficient documentation

## 2022-05-24 LAB — BASIC METABOLIC PANEL
Anion gap: 14 (ref 5–15)
BUN: 10 mg/dL (ref 6–20)
CO2: 18 mmol/L — ABNORMAL LOW (ref 22–32)
Calcium: 9 mg/dL (ref 8.9–10.3)
Chloride: 103 mmol/L (ref 98–111)
Creatinine, Ser: 0.59 mg/dL (ref 0.44–1.00)
GFR, Estimated: >60 mL/min (ref >60)
Glucose, Bld: 172 mg/dL — ABNORMAL HIGH (ref 70–99)
Potassium: 3.5 mmol/L (ref 3.5–5.1)
Sodium: 135 mmol/L (ref 135–145)

## 2022-05-24 LAB — CORTISOL-AM, BLOOD: Cortisol - AM: 12.8 ug/dL (ref 6.7–22.6)

## 2022-05-24 LAB — THYROID ANTIBODIES
Thyroglobulin Antibody: <1 IU/mL (ref 0.0–0.9)
Thyroperoxidase Ab SerPl-aCnc: 17 IU/mL (ref 0–34)

## 2022-05-24 LAB — MAGNESIUM: Magnesium: 1.5 mg/dL — ABNORMAL LOW (ref 1.7–2.4)

## 2022-05-24 MED ORDER — IOHEXOL 350 MG/ML SOLN
75.0000 mL | Freq: Once | INTRAVENOUS | Status: AC | PRN
  Administered 2022-05-24: 75 mL via INTRAVENOUS

## 2022-05-24 MED ORDER — POTASSIUM CHLORIDE 20 MEQ PO PACK
40.0000 meq | PACK | Freq: Once | ORAL | Status: AC
  Administered 2022-05-24: 40 meq via ORAL
  Filled 2022-05-24: qty 2

## 2022-05-24 MED ORDER — BISOPROLOL FUMARATE 10 MG PO TABS
10.0000 mg | ORAL_TABLET | Freq: Every day | ORAL | Status: DC
  Administered 2022-05-24: 10 mg via ORAL
  Filled 2022-05-24 (×2): qty 1

## 2022-05-24 MED ORDER — MAGNESIUM SULFATE 4 GM/100ML IV SOLN
4.0000 g | Freq: Once | INTRAVENOUS | Status: AC
  Administered 2022-05-24: 4 g via INTRAVENOUS
  Filled 2022-05-24: qty 100

## 2022-05-24 MED ORDER — SODIUM CHLORIDE 0.9 % IV SOLN
1.0000 g | Freq: Three times a day (TID) | INTRAVENOUS | Status: DC
  Administered 2022-05-24 – 2022-05-28 (×11): 1 g via INTRAVENOUS
  Filled 2022-05-24 (×13): qty 20

## 2022-05-24 NOTE — Procedures (Addendum)
Patient Name: Amber White  MRN: [id number]  Epilepsy Attending: Lora Havens  Referring Physician/Provider: Lorenza Chick, MD  Duration: 05/24/2022 0453 to 05/24/2022 1154   Patient history: 46 year old female with acute onset bilateral upper and lower extremity weakness, confusion, hallucinations.  EEG to evaluate for seizure.   Level of alertness: Awake, asleep   AEDs during EEG study: None   Technical aspects: This EEG study was done with scalp electrodes positioned according to the 10-20 International system of electrode placement. Electrical activity was reviewed with band pass filter of 1-'70Hz'$ , sensitivity of 7 uV/mm, display speed of 64m/sec with a '60Hz'$  notched filter applied as appropriate. EEG data were recorded continuously and digitally stored.  Video monitoring was available and reviewed as appropriate.   Description: The posterior dominant rhythm consists of 8 Hz activity of moderate voltage (25-35 uV) seen predominantly in posterior head regions, symmetric and reactive to eye opening and eye closing. Sleep was characterized by vertex waves, sleep spindles (12 to 14 Hz), maximal frontocentral region. EEG showed continuous generalized polymorphic 3 to 6 Hz theta-delta slowing. Hyperventilation and photic stimulation were not performed.      ABNORMALITY - Continuous slow, generalized   IMPRESSION: This study is suggestive of moderate diffuse encephalopathy, nonspecific etiology. No seizures or epileptiform discharges were seen throughout the recording.   Amber White

## 2022-05-24 NOTE — Progress Notes (Signed)
Neurology Progress Note  Brief HPI: 46 year old patient with history of breast cancer status post chemo and radiation now undergoing breast reconstructive surgery, renal insufficiency, obesity, cardiomyopathy and peripheral neuropathy presents with confusion and motor apraxia.  Over last weekend, she had nausea, vomiting and diarrhea and felt like she had a viral infection.  On Thursday, she was evaluated in the ED with concerns for pneumonia on CT chest.  After discharge from the ED she began to have hallucinations and had difficulty ambulating.  Patient is now oriented to person and place, but begins talking about furniture when asked if she understands why she is in the hospital.    Subjective: Patient's mental status is improved today.  She is oriented somewhat to situation and is able to recall some of the events leading up to her hospitalization.  Conversation is still somewhat tangential.  Aphasia is improved.  Exam: Vitals:   05/24/22 0008 05/24/22 0539  BP: 93/73 119/76  Pulse: 99 (!) 106  Resp: 17 17  Temp: 98.6 F (37 C) 98.4 F (36.9 C)  SpO2: 98%    Gen: In bed, NAD Resp: non-labored breathing, no acute distress  Neuro: Mental Status: Alert and oriented to person, place and situation.  Pleasant and cooperative with exam.  Speech is somewhat tangential, but patient is able to describe events leading up to her hospitalization.  Get stuck on words from time to time, but less than yesterday.  Initially stated that her thumb was her pinky finger, but then corrected herself.  Has mild aphasia but no dysarthria noted Cranial Nerves: Pupils equal round and reactive, extraocular movements intact, facial sensation symmetric, hearing intact, shoulder shrug symmetric, phonation normal, tongue midline Motor: Tone and bulk are normal, and patient has full strength in bilateral upper extremities, able to lift bilateral lower extremities easily off the bed today. Sensory: Intact to light touch  in all 4 extremities DTR: Unable to elicit Gait: Deferred  Pertinent Labs:    Latest Ref Rng & Units 05/23/2022    4:41 AM 05/22/2022   11:11 PM 05/21/2022   11:15 PM  CBC  WBC 4.0 - 10.5 K/uL 4.4  4.1  5.5   Hemoglobin 12.0 - 15.0 g/dL 10.8  10.6  11.2   Hematocrit 36.0 - 46.0 % 32.8  32.4  35.1   Platelets 150 - 400 K/uL 230  229  259        Latest Ref Rng & Units 05/24/2022    7:10 AM 05/23/2022    4:41 AM 05/22/2022   11:11 PM  BMP  Glucose 70 - 99 mg/dL 172  110  98   BUN 6 - 20 mg/dL '10  9  11   '$ Creatinine 0.44 - 1.00 mg/dL 0.59  0.72  0.65   Sodium 135 - 145 mmol/L 135  136  136   Potassium 3.5 - 5.1 mmol/L 3.5  3.9  4.0   Chloride 98 - 111 mmol/L 103  107  107   CO2 22 - 32 mmol/L '18  17  20   '$ Calcium 8.9 - 10.3 mg/dL 9.0  9.6  9.7   TSH 1.486 T4 0.75  Autoimmune encephalitis panel pending HIV negative Thyroid antibodies pending MMA pending Thiamine pending B12 1454 Ammonia less than 10 LP with no pleocytosis, protein elevated to 119, meningitis panel negative.  Imaging Reviewed:  CT head: No acute intracranial abnormality  Unable to perform MRI due to incompatible breast tissue expanders  Assessment: 46 year old patient with history of  breast cancer status postchemotherapy and radiation, renal insufficiency, cardiomyopathy and peripheral neuropathy presents with acute onset confusion and motor apraxia after a viral illness over the weekend.  On exam, patient is oriented to situation today, but speech is still somewhat tangential and some confusion is still evident.  Strength testing is limited due to pain, but patient has good antigravity strength in all 4 extremities. CTH negative. EEG shows no seizure activity so far. LP with no pleocytosis, elevated protein but not concerning for infection.  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  Recommendations: 1) continue thiamine  supplementation 2) continue high-dose methylprednisolone 3) discontinue EEG if no seizure activity seen today 4) CT chest to rule out occult malignancy  [place] , MSN, AGACNP-BC [facility] See Amion for schedule and pager information 05/24/2022 8:43 AM   NEUROHOSPITALIST ADDENDUM Performed a face to face diagnostic evaluation.   I have reviewed the contents of history and physical exam as documented by PA/ARNP/Resident and agree with above documentation.  I have discussed and formulated the above plan as documented. Edits to the note have been made as needed.  Donnetta Simpers, MD [facility] [phone]   If 7pm to 7am, please call on call as listed on AMION.

## 2022-05-24 NOTE — Progress Notes (Signed)
Confirmed with micro today that the patient has 1/4 bottles with GNR not GPR. Organism not on BCID panel. D/w Dr. Erlinda Hong and we will start meropenem empirically until organism is back.  Merrem 1g IV q8  Onnie Boer, PharmD, [place], AAHIVP, CPP Infectious Disease Pharmacist 05/24/2022 5:06 PM

## 2022-05-24 NOTE — Progress Notes (Signed)
NEW ADMISSION NOTE New Admission Note:   Arrival Method:  Stretcher from ED Mental Orientation: Alert and Oriented x2 Telemetry: Box #4 Assessment: Initiated Skin: Stage II on sacrum IV: Left forearm with Lactated Ringers at 75 mL/hr Pain: 0/10 Tubes: EEG leads Safety Measures: Safety Fall Prevention Plan has been given, discussed and implemented. Admission: Initiated [facility] [place] Orientation: Patient has been orientated to the room, unit and staff.  Family: Not present at the bedside  Orders have been reviewed and implemented. Will continue to monitor the patient. Call light has been placed within reach and bed alarm has been activated.   Sharmon Revere, RN

## 2022-05-24 NOTE — Progress Notes (Signed)
LTM EEG discontinued - no skin breakdown at unhook.   

## 2022-05-24 NOTE — Progress Notes (Signed)
PROGRESS NOTE    Amber White  PJK:[id number] DOB: 1976/05/12 DOA: 05/22/2022 PCP: [name], MD     Brief Narrative:   high-grade right breast invasive ductal carcinoma diagnosed in 10/2020 treated with chemotherapy, radiation, bilateral mastectomies, and maintenance pembrolizumab.  She underwent breast reconstructive surgery in 03/2022.  Also has history of hypertension, adrenal insufficiency, cardiomyopathy, and obesity.  She presented to the ED 2 days ago for vomiting, diarrhea, and headache   Subjective:  She received high dose solumedrol ,She is feeling better, reports confusion has resolved, reports ate salad brought home by her daughter, then developed ab pain , n/v/d, was seen in the ED on 11/8  Assessment & Plan:  Principal Problem:   Lower extremity weakness Active Problems:   History of breast cancer   Upper extremity weakness   Confusion   Hypomagnesemia   HTN (hypertension)   Pressure injury of skin    Confusion/metabolic encephalopathy? Seen by neurology , appreciate input  Bacteremia? Blood culture 1/4 bottles with GNR , reports ab pain n/v/d after eating salad,  start abx  Hypomagnesemia Replace mag, recheck in the morning  History of breast cancer No longer on treatment. -Outpatient oncology follow-up   Hypertension Stable.  Per pharmacy med rec, she is not taking any antihypertensives. -Monitor blood pressure closely   History of adrenal insufficiency In the setting of previous cancer treatment.  Per pharmacy med rec, she is no longer on steroids. -Check cortisol and ACTH levels  H/o adrenal insufficiency  A.m. cortisol 12.8, cosyntropin stim test in process Procalcitonin less than 0.1    Body mass index is 37.12 kg/m.Marland Kitchen  Meet obesity criteria   Skin Assessment: I have examined the patient's skin and I agree with the wound assessment as performed by the wound care RN as outlined below: Pressure Injury 05/23/22 Sacrum Mid Stage 2 -   Partial thickness loss of dermis presenting as a shallow open injury with a red, pink wound bed without slough. (Active)  05/23/22 2200  Location: Sacrum  Location Orientation: Mid  Staging: Stage 2 -  Partial thickness loss of dermis presenting as a shallow open injury with a red, pink wound bed without slough.  Wound Description (Comments):   Present on Admission: Yes  Dressing Type Foam - Lift dressing to assess site every shift 05/23/22 2200     I have Reviewed nursing notes, Vitals, pain scores, I/o's, Lab results and  imaging results since pt's last encounter, details please see discussion above  I ordered the following labs:  Unresulted Labs (From admission, onward)     Start     Ordered   05/24/22 0500  ACTH  Tomorrow morning,   R        05/23/22 2158   05/24/22 0500  IgG  Once,   STAT        05/24/22 0500   05/23/22 1448  IgG CSF index  Once,   TIMED        05/23/22 1448   05/23/22 0443  Miscellaneous [facility] test (send-out)  Once,   URGENT       Comments: Autoimmune Encephalitis Ab panel - CSF   Question Answer Comment  Test name / description: Autoimmune Encephalitis Ab panel - CSF   Release to patient Immediate      05/23/22 0444   05/23/22 0443  Draw extra clot tube  (Oligoclonal Bands, CSF + Serum panel)  Once,   URGENT        05/23/22 0444  05/23/22 0443  Miscellaneous [facility] test (send-out)  Once,   URGENT       Comments: Paraneoplastic panel CSF   Question Answer Comment  Test name / description: Paraneoplastic panel CSF   Release to patient Immediate      05/23/22 0444   05/23/22 0443  Miscellaneous [facility] test (send-out)  Once,   URGENT       Comments: Paraneoplastic panel serum   Question Answer Comment  Test name / description: Paraneoplastic panel serum   Release to patient Immediate      05/23/22 0444   05/23/22 0443  Draw extra clot tube  (IgG CSF Index, CSF + Serum panel)  Once,   URGENT        05/23/22 0444   05/23/22 0443  Thyroid  antibodies  Once,   URGENT        05/23/22 0444   05/22/22 2351  Urinalysis, Routine w reflex microscopic  Once,   URGENT        05/22/22 2350   05/22/22 2302  Vitamin B1  Once,   URGENT        05/22/22 2301   05/22/22 2302  Methylmalonic acid, serum  Once,   URGENT        05/22/22 2301             DVT prophylaxis: SCDs Start: 05/23/22 5462   Code Status:   Code Status: Full Code  Family Communication: Patient Disposition:   Dispo: The patient is from: Home              Anticipated d/c is to: Home              Anticipated d/c date is: > 24hrs, f/u blood culture result, follow neurology recommendation  Antimicrobials:    Anti-infectives (From admission, onward)    Start     Dose/Rate Route Frequency Ordered Stop   05/24/22 1745  meropenem (MERREM) 1 g in sodium chloride 0.9 % 100 mL IVPB        1 g 200 mL/hr over 30 Minutes Intravenous Every 8 hours 05/24/22 1659            Objective: Vitals:   05/23/22 1926 05/24/22 0008 05/24/22 0500 05/24/22 0539  BP: (!) 131/92 93/73  119/76  Pulse: (!) 108 99  (!) 106  Resp: '18 17  17  '$ Temp: 98.7 F (37.1 C) 98.6 F (37 C)  98.4 F (36.9 C)  TempSrc: Oral Oral    SpO2: 99% 98%    Weight:   98.1 kg   Height:        Intake/Output Summary (Last 24 hours) at 05/24/2022 1537 Last data filed at 05/24/2022 1400 Gross per 24 hour  Intake 1330.23 ml  Output 300 ml  Net 1030.23 ml   Filed Weights   05/22/22 2132 05/24/22 0500  Weight: 97.3 kg 98.1 kg    Examination:  General exam: alert, awake, communicative,calm, NAD Respiratory system: Clear to auscultation. Respiratory effort normal. Cardiovascular system:  RRR.  Gastrointestinal system: Abdomen is nondistended, soft and nontender.  Normal bowel sounds heard. Central nervous system: Alert and oriented. No focal neurological deficits. Extremities:  no edema Skin: No rashes, lesions or ulcers Psychiatry: Judgement and insight appear normal. Mood & affect  appropriate.     Data Reviewed: I have personally reviewed  labs and visualized  imaging studies since the last encounter and formulate the plan        Scheduled Meds:  bisoprolol  10 mg Oral Daily   pantoprazole (PROTONIX) IV  40 mg Intravenous Q24H   [START ON 05/26/2022] thiamine (VITAMIN B1) injection  100 mg Intravenous Q24H   Continuous Infusions:  lactated ringers Stopped (05/24/22 1150)   methylPREDNISolone (SOLU-MEDROL) injection 1,000 mg (05/23/22 2239)   thiamine (VITAMIN B1) injection Stopped (05/24/22 0705)     LOS: 1 day     Florencia Reasons, MD PhD FACP [facility]  Available via Epic secure chat 7am-7pm for nonurgent issues Please page for urgent issues To page the attending provider between 7A-7P or the covering provider during after hours 7P-7A, please log into the web site [email or web address] and access using universal [place] password for that web site. If you do not have the password, please call the hospital operator.    05/24/2022, 3:37 PM

## 2022-05-25 DIAGNOSIS — G0481 Other encephalitis and encephalomyelitis: Secondary | ICD-10-CM | POA: Diagnosis not present

## 2022-05-25 LAB — CBC WITH DIFFERENTIAL/PLATELET
Abs Immature Granulocytes: 0.03 10*3/uL (ref 0.00–0.07)
Basophils Absolute: 0 10*3/uL (ref 0.0–0.1)
Basophils Relative: 0 %
Eosinophils Absolute: 0 10*3/uL (ref 0.0–0.5)
Eosinophils Relative: 0 %
HCT: 28.5 % — ABNORMAL LOW (ref 36.0–46.0)
Hemoglobin: 9.6 g/dL — ABNORMAL LOW (ref 12.0–15.0)
Immature Granulocytes: 1 %
Lymphocytes Relative: 12 %
Lymphs Abs: 0.6 10*3/uL — ABNORMAL LOW (ref 0.7–4.0)
MCH: 29.6 pg (ref 26.0–34.0)
MCHC: 33.7 g/dL (ref 30.0–36.0)
MCV: 88 fL (ref 80.0–100.0)
Monocytes Absolute: 0.1 10*3/uL (ref 0.1–1.0)
Monocytes Relative: 3 %
Neutro Abs: 4.3 10*3/uL (ref 1.7–7.7)
Neutrophils Relative %: 84 %
Platelets: 198 10*3/uL (ref 150–400)
RBC: 3.24 MIL/uL — ABNORMAL LOW (ref 3.87–5.11)
RDW: 14.1 % (ref 11.5–15.5)
WBC: 5.1 10*3/uL (ref 4.0–10.5)
nRBC: 0 % (ref 0.0–0.2)

## 2022-05-25 LAB — BASIC METABOLIC PANEL
Anion gap: 10 (ref 5–15)
BUN: 10 mg/dL (ref 6–20)
CO2: 21 mmol/L — ABNORMAL LOW (ref 22–32)
Calcium: 9.2 mg/dL (ref 8.9–10.3)
Chloride: 108 mmol/L (ref 98–111)
Creatinine, Ser: 0.66 mg/dL (ref 0.44–1.00)
GFR, Estimated: >60 mL/min (ref >60)
Glucose, Bld: 154 mg/dL — ABNORMAL HIGH (ref 70–99)
Potassium: 3.6 mmol/L (ref 3.5–5.1)
Sodium: 139 mmol/L (ref 135–145)

## 2022-05-25 LAB — MAGNESIUM: Magnesium: 2.2 mg/dL (ref 1.7–2.4)

## 2022-05-25 LAB — IGG: IgG (Immunoglobin G), Serum: 1688 mg/dL — ABNORMAL HIGH (ref 586–1602)

## 2022-05-25 MED ORDER — LACTATED RINGERS IV SOLN: INTRAVENOUS | Status: AC

## 2022-05-25 MED ORDER — POTASSIUM CHLORIDE CRYS ER 20 MEQ PO TBCR
40.0000 meq | EXTENDED_RELEASE_TABLET | Freq: Once | ORAL | Status: AC
  Administered 2022-05-25: 40 meq via ORAL
  Filled 2022-05-25: qty 2

## 2022-05-25 NOTE — Progress Notes (Addendum)
PROGRESS NOTE    Amber White  OJJ:[id number] DOB: [birth year] DOA: 05/22/2022 PCP: [name], MD     Brief Narrative:   high-grade right breast invasive ductal carcinoma diagnosed in 10/2020 treated with chemotherapy, radiation, bilateral mastectomies, and maintenance pembrolizumab.  She underwent breast reconstructive surgery in 03/2022.  Also has history of hypertension, adrenal insufficiency, cardiomyopathy, and obesity.  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 - S/p LP, CSF meningitis/encephalitis panel negative, CSF culture negative, csf VRDL negative,   -CSF oligoclonal bands, CSF IgG pending -Seen by neurology ,Started on high does solumedrol since  11/10, plan to finish total of 5 days  -she has greatly improved  GNR Bacteremia -reports ate salad brought home by her daughter, then developed ab pain , n/v/d, was seen in the ED on 11/8 -Blood culture 1/4 bottles with GNR , BCID negative, started meropenem on 11/11, f/u on culture result   Hypokalemia/Hypomagnesemia Replaced and improved, recheck in the morning  Hypertension Home medication bisoprolol held, due to low normal BP, appear dehydrated, start hydration -Monitor blood pressure closely   History of adrenal insufficiency In the setting of previous cancer treatment.  Per pharmacy med rec, she is no longer on steroids. -Am cortisol and ACTH levels obtained after solumedrol given, result may not be  reliable  History of breast cancer No longer on treatment. -Outpatient oncology follow-up     Body mass index is 37.12 kg/m.Marland Kitchen  Meet obesity criteria   Skin Assessment: I have examined the patient's skin and I agree with the wound assessment as performed by the wound care RN as outlined below: Pressure Injury 05/23/22 Sacrum Mid Stage 2 -  Partial thickness loss of dermis presenting as a shallow open injury with a red, pink wound bed without slough. (Active)  05/23/22 2200  Location: Sacrum  Location Orientation: Mid  Staging: Stage 2 -  Partial thickness loss of dermis presenting as a shallow open injury with a red, pink wound bed without slough.  Wound Description (Comments):   Present on Admission: Yes  Dressing Type None 05/24/22 2100     I have Reviewed nursing notes, Vitals, pain scores, I/o's, Lab results and  imaging results since pt's last encounter, details please see discussion above  I ordered the following labs:  Unresulted Labs (From admission, onward)     Start     Ordered   05/24/22 0500  ACTH  Tomorrow morning,   R        05/23/22 2158   05/24/22 0500  IgG  Once,   STAT        05/24/22 0500   05/23/22 1448  IgG CSF index  Once,   TIMED        05/23/22 1448   05/23/22 0443  Miscellaneous [facility] test (send-out)  Once,   URGENT       Comments: Autoimmune Encephalitis Ab panel - CSF   Question Answer Comment  Test name / description: Autoimmune Encephalitis Ab panel - CSF   Release to  patient Immediate      05/23/22 0444   05/23/22 0443  Draw extra clot tube  (Oligoclonal Bands, CSF + Serum panel)  Once,   URGENT        05/23/22 0444   05/23/22 0443  Miscellaneous [facility] test (send-out)  Once,   URGENT       Comments: Paraneoplastic panel CSF   Question Answer Comment  Test name / description: Paraneoplastic panel CSF   Release to patient Immediate      05/23/22 0444   05/23/22 0443  Miscellaneous [facility] test (send-out)  Once,   URGENT        Comments: Paraneoplastic panel serum   Question Answer Comment  Test name / description: Paraneoplastic panel serum   Release to patient Immediate      05/23/22 0444   05/23/22 0443  Draw extra clot tube  (IgG CSF Index, CSF + Serum panel)  Once,   URGENT        05/23/22 0444   05/22/22 2351  Urinalysis, Routine w reflex microscopic  Once,   URGENT        05/22/22 2350   05/22/22 2302  Vitamin B1  Once,   URGENT        05/22/22 2301   05/22/22 2302  Methylmalonic acid, serum  Once,   URGENT        05/22/22 2301             DVT prophylaxis: SCDs Start: 05/23/22 3151   Code Status:   Code Status: Full Code  Family Communication: Significant other at bedside with permission on 11/12 Disposition:   Dispo: The patient is from: Home              Anticipated d/c is to: Home              Anticipated d/c date is: need to finish IV steroids, , f/u blood culture result, follow neurology recommendation  Antimicrobials:    Anti-infectives (From admission, onward)    Start     Dose/Rate Route Frequency Ordered Stop   05/24/22 1745  meropenem (MERREM) 1 g in sodium chloride 0.9 % 100 mL IVPB        1 g 200 mL/hr over 30 Minutes Intravenous Every 8 hours 05/24/22 1659            Objective: Vitals:   05/24/22 2113 05/25/22 0527 05/25/22 0915 05/25/22 1634  BP: (!) 115/100 (!) 85/70 116/79 110/73  Pulse: 94 93 84 84  Resp: '18 18 18 18  '$ Temp: 98.2 F (36.8 C) 98.3 F (36.8 C) 98.3 F (36.8 C) 98.5 F (36.9 C)  TempSrc:   Oral Oral  SpO2: 100% 100% 99% 100%  Weight:      Height:        Intake/Output Summary (Last 24 hours) at 05/25/2022 1837 Last data filed at 05/25/2022 1700 Gross per 24 hour  Intake 1864.1 ml  Output 300 ml  Net 1564.1 ml   Filed Weights   05/22/22 2132 05/24/22 0500  Weight: 97.3 kg 98.1 kg    Examination:  General exam: alert, awake, communicative,calm, NAD Respiratory system: Clear to auscultation. Respiratory effort  normal. Cardiovascular system:  RRR.  Gastrointestinal system: Abdomen is nondistended, soft and nontender.  Normal bowel sounds heard. Central nervous system: Alert and oriented. No focal neurological deficits. Extremities:  no edema Skin: No rashes, lesions or ulcers Psychiatry: Judgement and insight appear normal. Mood & affect appropriate.     Data  Reviewed: I have personally reviewed  labs and visualized  imaging studies since the last encounter and formulate the plan        Scheduled Meds:  pantoprazole (PROTONIX) IV  40 mg Intravenous Q24H   [START ON 05/26/2022] thiamine (VITAMIN B1) injection  100 mg Intravenous Q24H   Continuous Infusions:  lactated ringers     meropenem (MERREM) IV 1 g (05/25/22 1243)   methylPREDNISolone (SOLU-MEDROL) injection 1,000 mg (05/24/22 2128)   thiamine (VITAMIN B1) injection 500 mg (05/25/22 1406)     LOS: 2 days     Florencia Reasons, MD PhD FACP [facility]  Available via Epic secure chat 7am-7pm for nonurgent issues Please page for urgent issues To page the attending provider between 7A-7P or the covering provider during after hours 7P-7A, please log into the web site [email or web address] and access using universal [place] password for that web site. If you do not have the password, please call the hospital operator.    05/25/2022, 6:37 PM

## 2022-05-25 NOTE — Consult Note (Signed)
WOC Nurse Consult Note: Reason for Consult:Stage 2 PI to sacrum Wound type:pressure plus moisture Pressure Injury POA: Yes/No/NA Measurement:3cm x 1cm x 0.1cm Wound HDQ:QIWL Drainage (amount, consistency, odor) none Periwound:intact Dressing procedure/placement/frequency: Turning and repositioning is in place. I have added guidance for time in the supine position to me minimized. Topical care to the Stage 2 PI is guided by standing order skin care order set and includes cleanse the affected areas with NS once daily and covering with folded piece of xeroform gauze. This is to be topped with a dry gauze and secured with a silicone foam for atraumatic dressing changes. Heels are to be floated.   [place] nursing team will not follow, but will remain available to this patient, the nursing and medical teams.  Please re-consult if needed.  Thank you for inviting [place] to participate in this patient's Plan of Care.  Maudie Flakes, MSN, RN, CNS, [place], Serita Grammes, [employer], [employer] phone:  [phone]

## 2022-05-25 NOTE — Progress Notes (Signed)
Dr. Erlinda Hong ok pt to shower

## 2022-05-25 NOTE — Progress Notes (Signed)
Neurology Progress Note  Brief HPI: 46 year old patient with history of breast cancer status post chemo and radiation now undergoing breast reconstructive surgery, renal insufficiency, obesity, cardiomyopathy and peripheral neuropathy presents with confusion and motor apraxia.  Over last weekend, she had nausea, vomiting and diarrhea and felt like she had a viral infection.  On Thursday, she was evaluated in the ED with concerns for pneumonia on CT chest.  After discharge from the ED she began to have hallucinations and had difficulty ambulating.  Patient is now oriented to person and place, but begins talking about furniture when asked if she understands why she is in the hospital.    Subjective: Patient's mental status is improved today.  Aphasia is mildly improved from yesterday. She can walk around today witthout significant motor apraxia  Exam: Vitals:   05/25/22 0527 05/25/22 0915  BP: (!) 85/70 116/79  Pulse: 93 84  Resp: 18 18  Temp: 98.3 F (36.8 C) 98.3 F (36.8 C)  SpO2: 100% 99%   Gen: In bed, NAD Resp: non-labored breathing, no acute distress  Neuro: Mental Status: Alert and oriented to person, place and situation.  Pleasant and cooperative with exam.  Speech is not tangential today. Patient is able to describe events leading up to her hospitalization.  Get stuck on words from time to time, but less than yesterday.  Initially stated that her thumb was her pinky finger, but then corrected herself.  Has mild aphasia but no dysarthria noted Cranial Nerves: Pupils equal round and reactive, extraocular movements intact, facial sensation symmetric, hearing intact, shoulder shrug symmetric, phonation normal, tongue midline Motor: Tone and bulk are normal, and patient has full strength in bilateral upper extremities, able to lift bilateral lower extremities easily off the bed today. Sensory: Intact to light touch in all 4 extremities DTR: Unable to elicit Gait: walks around without  any significant gait abnormality, no sway, stride length is short, poor arm swing but also using arms to hold onto her IV.  Pertinent Labs:    Latest Ref Rng & Units 05/25/2022    9:49 AM 05/23/2022    4:41 AM 05/22/2022   11:11 PM  CBC  WBC 4.0 - 10.5 K/uL 5.1  4.4  4.1   Hemoglobin 12.0 - 15.0 g/dL 9.6  10.8  10.6   Hematocrit 36.0 - 46.0 % 28.5  32.8  32.4   Platelets 150 - 400 K/uL 198  230  229        Latest Ref Rng & Units 05/25/2022    9:49 AM 05/24/2022    7:10 AM 05/23/2022    4:41 AM  BMP  Glucose 70 - 99 mg/dL 154  172  110   BUN 6 - 20 mg/dL '10  10  9   '$ Creatinine 0.44 - 1.00 mg/dL 0.66  0.59  0.72   Sodium 135 - 145 mmol/L 139  135  136   Potassium 3.5 - 5.1 mmol/L 3.6  3.5  3.9   Chloride 98 - 111 mmol/L 108  103  107   CO2 22 - 32 mmol/L '21  18  17   '$ Calcium 8.9 - 10.3 mg/dL 9.2  9.0  9.6   TSH 1.486 T4 0.75  Autoimmune encephalitis panel pending HIV negative Thyroid antibodies negative MMA pending Thiamine pending B12 1454 Ammonia less than 10 LP with no pleocytosis, protein elevated to 119, meningitis panel negative.  Imaging Reviewed:  CT head: No acute intracranial abnormality  Unable to perform MRI due to  incompatible breast tissue expanders  Assessment: 46 year old patient with history of breast cancer status postchemotherapy and radiation, renal insufficiency, cardiomyopathy and peripheral neuropathy presents with acute onset confusion and motor apraxia after a viral illness over the weekend.  On exam, patient is oriented to situation today, but speech is still somewhat tangential and some confusion is still evident.  Strength testing is limited due to pain, but patient has good antigravity strength in all 4 extremities. CTH negative. EEG shows no seizure activity so far. LP with no pleocytosis, elevated protein but not concerning for infection.  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  Recommendations: 1) continue thiamine supplementation 2) continue IC Solumedrol x 5 doses with last dose on 11/14. 3) LTM with no seizures and discontinued. 4) CT chest twith pneumonia but no obvious malignancy.   [place] Pager Number [phone]

## 2022-05-26 DIAGNOSIS — I10 Essential (primary) hypertension: Secondary | ICD-10-CM | POA: Diagnosis not present

## 2022-05-26 DIAGNOSIS — R410 Disorientation, unspecified: Secondary | ICD-10-CM | POA: Diagnosis not present

## 2022-05-26 DIAGNOSIS — R29898 Other symptoms and signs involving the musculoskeletal system: Secondary | ICD-10-CM | POA: Diagnosis not present

## 2022-05-26 DIAGNOSIS — C50919 Malignant neoplasm of unspecified site of unspecified female breast: Secondary | ICD-10-CM

## 2022-05-26 DIAGNOSIS — G131 Other systemic atrophy primarily affecting central nervous system in neoplastic disease: Secondary | ICD-10-CM | POA: Diagnosis not present

## 2022-05-26 DIAGNOSIS — Z853 Personal history of malignant neoplasm of breast: Secondary | ICD-10-CM | POA: Diagnosis not present

## 2022-05-26 LAB — CULTURE, BLOOD (ROUTINE X 2): Culture: NO GROWTH

## 2022-05-26 LAB — CSF CULTURE W GRAM STAIN: Culture: NO GROWTH

## 2022-05-26 LAB — METHYLMALONIC ACID, SERUM: Methylmalonic Acid, Quantitative: 107 nmol/L (ref 0–378)

## 2022-05-26 MED ORDER — ENOXAPARIN SODIUM 40 MG/0.4ML IJ SOSY: 40.0000 mg | PREFILLED_SYRINGE | INTRAMUSCULAR | Status: DC

## 2022-05-26 MED ORDER — ENOXAPARIN SODIUM 60 MG/0.6ML IJ SOSY
50.0000 mg | PREFILLED_SYRINGE | INTRAMUSCULAR | Status: DC
  Administered 2022-05-26 – 2022-05-27 (×2): 50 mg via SUBCUTANEOUS
  Filled 2022-05-26 (×2): qty 0.6

## 2022-05-26 NOTE — TOC Initial Note (Addendum)
Transition of Care [facility]) - Initial/Assessment Note    Patient Details  Name: Amber White MRN: [id number] Date of Birth: [birth year]  [facility] CM/SW Contact:    Tom-Johnson, Renea Ee, RN Phone Number: 05/26/2022, 1:36 PM  Clinical Narrative:                  CM spoke with patient at bedside about needs for post hospital transition. Admitted for Lower extremities weakness and Confusion. Confusion is resolved per MD. On Solumedrol for 5 days treatment. Patient states she is from home with her three daughters and a grand daughter. Both parents and siblings supportive with care. Patient has hx of Breast Ca, had bilateral Mastectomy and treated with Chemotherapy and Radiation. Currently employed at [employer] in [place]. Independent with care prior to admission. Does not have DME's at home. Patient currently has a stage 2 pressure wound to sacrum WOC following. Unsure how it happened if patient was not bed ridden prior to admission.  PCP is [place], Rosalyn Charters, MD [facility] [address]  No TOC needs or recommendation noted at this time. CM will continue to follow as patient progresses with care towards discharge.       Barriers to Discharge: Continued Medical Work up   Patient Goals and CMS Choice Patient states their goals for this hospitalization and ongoing recovery are:: To return home CMS Medicare.gov Compare Post Acute Care list provided to:: Patient    Expected Discharge Plan and Services     Discharge Planning Services: CM Consult   Living arrangements for the past 2 months: Single Family Home                                      Prior Living Arrangements/Services Living arrangements for the past 2 months: Single Family Home Lives with:: Adult Children Patient language and need for interpreter reviewed:: Yes Do you feel safe going back to the place where you live?: Yes      Need for Family Participation in Patient Care:  Yes (Comment) Care giver support system in place?: Yes (comment)   Criminal Activity/Legal Involvement Pertinent to Current Situation/Hospitalization: No - Comment as needed  Activities of Daily Living Home Assistive Devices/Equipment: None ADL Screening (condition at time of admission) Patient's cognitive ability adequate to safely complete daily activities?: Yes Is the patient deaf or have difficulty hearing?: No Does the patient have difficulty seeing, even when wearing glasses/contacts?: No Does the patient have difficulty concentrating, remembering, or making decisions?: Yes Patient able to express need for assistance with ADLs?: Yes Does the patient have difficulty dressing or bathing?: Yes Independently performs ADLs?: No Communication: Independent Dressing (OT): Needs assistance Is this a change from baseline?: Pre-admission baseline Grooming: Needs assistance Is this a change from baseline?: Pre-admission baseline Feeding: Independent Bathing: Needs assistance Is this a change from baseline?: Pre-admission baseline Toileting: Needs assistance Is this a change from baseline?: Pre-admission baseline In/Out Bed: Needs assistance Is this a change from baseline?: Pre-admission baseline Walks in Home: Needs assistance Is this a change from baseline?: Pre-admission baseline Does the patient have difficulty walking or climbing stairs?: Yes Weakness of Legs: Both Weakness of Arms/Hands: None  Permission Sought/Granted Permission sought to share information with : Case Manager, Family Supports Permission granted to share information with : Yes, Verbal Permission Granted  Emotional Assessment Appearance:: Appears stated age Attitude/Demeanor/Rapport: Engaged, Gracious Affect (typically observed): Accepting, Appropriate, Calm, Hopeful, Pleasant Orientation: : Oriented to Self, Oriented to Place, Oriented to Situation Alcohol / Substance Use: Not  Applicable Psych Involvement: No (comment)  Admission diagnosis:  Encephalopathy [G93.40] Proximal muscle weakness [M62.81] Patient Active Problem List   Diagnosis Date Noted   Pressure injury of skin 05/24/2022   Lower extremity weakness 05/23/2022   Upper extremity weakness 05/23/2022   Confusion 05/23/2022   Hypomagnesemia 05/23/2022   HTN (hypertension) 05/23/2022   History of breast cancer 04/01/2022   Chemotherapy-induced peripheral neuropathy ([place]) 02/04/2022   Cardiomyopathy ([place])    Dyspnea 05/22/2021   Dyspnea on minimal exertion 05/21/2021   Hypokalemia 05/21/2021   Hyponatremia 05/21/2021   Anemia associated with chemotherapy 05/21/2021   Mild protein malnutrition ([place]) 05/21/2021   Elevated TSH 05/21/2021   Genetic testing 11/28/2020   Family history of breast cancer    Malignant neoplasm of upper-outer quadrant of right breast in female, estrogen receptor negative ([place]) 11/06/2020   Pregnancy 02/19/2014   Morbid obesity ([place]) 02/19/2014   Dermoid cyst of left ovary 02/19/2014   PCP:  [name], MD [id number] [place] [place] [address]Phone: [phone]:: [phone]    Social Determinants of Health (SDOH) Interventions    Readmission Risk Interventions     No data to display

## 2022-05-26 NOTE — Progress Notes (Signed)
PROGRESS NOTE    Amber White  DZH:[id number] DOB: [birth year] DOA: 05/22/2022 PCP: [name], MD     Brief Narrative:   high-grade right breast invasive ductal carcinoma diagnosed in 10/2020 treated with chemotherapy, radiation, bilateral mastectomies, and maintenance pembrolizumab.  She underwent breast reconstructive surgery in 03/2022.  Also has history of hypertension, adrenal insufficiency, cardiomyopathy, and obesity.  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 - S/p LP, CSF meningitis/encephalitis panel negative, CSF culture negative, csf VRDL negative,   -CSF oligoclonal bands, CSF IgG pending -Seen by neurology ,Started on high does solumedrol since  11/10, plan to finish total of 5 days  -she has greatly improved  GNR Bacteremia -reports ate salad brought home by her daughter, then developed ab pain , n/v/d, was seen in the ED on 11/8 -Blood culture 1/4 bottles with GNR , BCID negative, started meropenem on 11/11, f/u on culture result   Hypokalemia/Hypomagnesemia Replaced and improved, recheck in the morning  Hypertension Home medication bisoprolol held, due to low normal BP, appear dehydrated, start hydration -Monitor blood pressure closely   History of adrenal insufficiency In the setting of previous cancer treatment.  Per pharmacy med rec, she is no longer on steroids. 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 -Outpatient oncology follow-up     Body mass index is 37.12 kg/m.[place] Kitchen  Meet obesity criteria   Skin Assessment: I have examined the patient's skin and I agree with the wound assessment as performed by the wound care RN as outlined below: Pressure Injury 05/23/22 Sacrum Mid Stage 2 -  Partial thickness loss of dermis presenting as a shallow open injury with a red, pink wound bed without slough. (Active)  05/23/22 2200  Location: Sacrum  Location Orientation: Mid  Staging: Stage 2 -  Partial thickness loss of dermis presenting as a shallow open injury with a red, pink wound bed without slough.  Wound Description (Comments):   Present on Admission: Yes  Dressing Type None (Pt deferred dressing change) 05/26/22 0600     I have Reviewed nursing notes, Vitals, pain scores, I/o's, Lab results and  imaging results since pt's last encounter, details please see discussion above  I ordered the following labs:  Unresulted Labs (From admission, onward)     Start     Ordered   05/24/22 0500  ACTH  Tomorrow morning,   R        05/23/22 2158   05/23/22 1448  IgG CSF index  Once,   TIMED        05/23/22 1448   05/23/22 0443  Miscellaneous [facility] test (send-out)  Once,   URGENT       Comments: Autoimmune Encephalitis Ab panel - CSF   Question Answer Comment  Test name / description: Autoimmune  Encephalitis Ab panel - CSF   Release to patient Immediate      05/23/22 0444   05/23/22 0443  Draw extra clot tube  (Oligoclonal Bands, CSF + Serum panel)  Once,   URGENT        05/23/22 0444   05/23/22 0443  Miscellaneous [facility] test (send-out)  Once,   URGENT       Comments: Paraneoplastic panel CSF   Question Answer Comment  Test name / description:  Paraneoplastic panel CSF   Release to patient Immediate      05/23/22 0444   05/23/22 0443  Miscellaneous [facility] test (send-out)  Once,   URGENT       Comments: Paraneoplastic panel serum   Question Answer Comment  Test name / description: Paraneoplastic panel serum   Release to patient Immediate      05/23/22 0444   05/23/22 0443  Draw extra clot tube  (IgG CSF Index, CSF + Serum panel)  Once,   URGENT        05/23/22 0444   05/22/22 2351  Urinalysis, Routine w reflex microscopic  Once,   URGENT        05/22/22 2350   05/22/22 2302  Vitamin B1  Once,   URGENT        05/22/22 2301   05/22/22 2302  Methylmalonic acid, serum  Once,   URGENT        05/22/22 2301   05/21/22 2112  Organism ID, Bacteria  Once,   R        05/21/22 2112   05/21/22 2112  Susceptibility, Aer + Anaerob  Once,   R        05/21/22 2112             DVT prophylaxis: SCDs Start: 05/23/22 0649, lovenox   Code Status:   Code Status: Full Code  Family Communication: Significant other over the phone with permission on 11/13 Disposition:   Dispo: The patient is from: Home              Anticipated d/c is to: Home              Anticipated d/c date is: need to finish IV steroids, , f/u blood culture result, follow neurology recommendation  Antimicrobials:    Anti-infectives (From admission, onward)    Start     Dose/Rate Route Frequency Ordered Stop   05/24/22 1745  meropenem (MERREM) 1 g in sodium chloride 0.9 % 100 mL IVPB        1 g 200 mL/hr over 30 Minutes Intravenous Every 8 hours 05/24/22 1659            Objective: Vitals:   05/25/22 1634 05/25/22 2057 05/26/22 0615 05/26/22 0953  BP: 110/73 114/77 126/79 136/86  Pulse: 84 81 80 73  Resp: '18 18 18 17  '$ Temp: 98.5 F (36.9 C) 97.7 F (36.5 C) 98 F (36.7 C) 98.1 F (36.7 C)  TempSrc: Oral Oral Oral Oral  SpO2: 100% 100% 99% 94%  Weight:      Height:        Intake/Output Summary (Last 24 hours) at 05/26/2022 1511 Last data  filed at 05/26/2022 0200 Gross per 24 hour  Intake 890.34 ml  Output 0 ml  Net 890.34 ml   Filed Weights   05/22/22 2132 05/24/22 0500  Weight: 97.3 kg 98.1 kg    Examination:  General exam: alert, awake, communicative,calm, NAD Respiratory system: Clear to auscultation. Respiratory effort  normal. Cardiovascular system:  RRR.  Gastrointestinal system: Abdomen is nondistended, soft and nontender.  Normal bowel sounds heard. Central nervous system: Alert and oriented. No focal neurological deficits. Extremities:  no edema Skin: No rashes, lesions or ulcers Psychiatry: Judgement and insight appear normal. Mood & affect appropriate.     Data Reviewed: I have personally reviewed  labs and visualized  imaging studies since the last encounter and formulate the plan        Scheduled Meds:  enoxaparin (LOVENOX) injection  50 mg Subcutaneous Q24H   pantoprazole (PROTONIX) IV  40 mg Intravenous Q24H   thiamine (VITAMIN B1) injection  100 mg Intravenous Q24H   Continuous Infusions:  lactated ringers 75 mL/hr at 05/26/22 1308   meropenem (MERREM) IV 1 g (05/26/22 1035)   methylPREDNISolone (SOLU-MEDROL) injection 1,000 mg (05/25/22 2052)     LOS: 3 days     Kathie Dike, MD  [facility]  Available via Epic secure chat 7am-7pm for nonurgent issues Please page for urgent issues To page the attending provider between 7A-7P or the covering provider during after hours 7P-7A, please log into the web site [email or web address] and access using universal [place] password for that web site. If you do not have the password, please call the hospital operator.    05/26/2022, 3:11 PM

## 2022-05-26 NOTE — Progress Notes (Signed)
Neurology Progress Note  Brief HPI: 46 year old patient with history of breast cancer status post chemo and radiation now undergoing breast reconstructive surgery, renal insufficiency, obesity, cardiomyopathy and peripheral neuropathy presents with confusion and motor apraxia.  Over last weekend, she had nausea, vomiting and diarrhea and felt like she had a viral infection.  On Thursday, she was evaluated in the ED with concerns for pneumonia on CT chest.  After discharge from the ED she began to have hallucinations and had difficulty ambulating.  Patient is now oriented to person and place, but begins talking about furniture when asked if she understands why she is in the hospital.    Subjective: Patient's mental status stable form prior. Reports she has felt better overall. Is able to ambulate to bedside commode without difficulty. Aphasia and apraxia improved compared to prior.   Exam: Vitals:   05/26/22 0615 05/26/22 0953  BP: 126/79 136/86  Pulse: 80 73  Resp: 18 17  Temp: 98 F (36.7 C) 98.1 F (36.7 C)  SpO2: 99% 94%   Gen: In bed, NAD Resp: non-labored breathing, no acute distress  Neuro: Mental Status: Alert and oriented to person, place and situation.  States year correctly, but says it is January when asked for month. Recall and concentration are poor, unable to perform simple calculation, recalls 0/3 words after 10 minutes. Pleasant and cooperative on exam. There is some paucity of speech and delay when asked questions or asked to perform 3 step commands. Not dysarthric.    Cranial Nerves: Pupils equal round and reactive, extraocular movements intact, facial sensation symmetric, hearing intact, shoulder shrug symmetric, phonation normal, tongue midline Motor: Tone and bulk are normal, and patient has full strength in bilateral upper extremities, able to lift bilateral lower extremities easily off the bed today. Sensory: Intact to light touch in all 4 extremities DTR: Unable to  elicit Gait: walks around without any significant gait abnormality, no sway, stride length is short, poor arm swing but also using arms to hold onto her IV.  Pertinent Labs:    Latest Ref Rng & Units 05/25/2022    9:49 AM 05/23/2022    4:41 AM 05/22/2022   11:11 PM  CBC  WBC 4.0 - 10.5 K/uL 5.1  4.4  4.1   Hemoglobin 12.0 - 15.0 g/dL 9.6  10.8  10.6   Hematocrit 36.0 - 46.0 % 28.5  32.8  32.4   Platelets 150 - 400 K/uL 198  230  229        Latest Ref Rng & Units 05/25/2022    9:49 AM 05/24/2022    7:10 AM 05/23/2022    4:41 AM  BMP  Glucose 70 - 99 mg/dL 154  172  110   BUN 6 - 20 mg/dL '10  10  9   '$ Creatinine 0.44 - 1.00 mg/dL 0.66  0.59  0.72   Sodium 135 - 145 mmol/L 139  135  136   Potassium 3.5 - 5.1 mmol/L 3.6  3.5  3.9   Chloride 98 - 111 mmol/L 108  103  107   CO2 22 - 32 mmol/L '21  18  17   '$ Calcium 8.9 - 10.3 mg/dL 9.2  9.0  9.6   TSH 1.486 T4 0.75  Autoimmune encephalitis panel pending HIV negative Thyroid antibodies negative MMA pending Thiamine pending B12 1454 Ammonia less than 10 LP with no pleocytosis, protein elevated to 119, meningitis panel negative.  Imaging Reviewed:  CT head: No acute intracranial abnormality  Unable  to perform MRI due to incompatible breast tissue expanders  Assessment: 46 year old patient with history of breast cancer status postchemotherapy and radiation, renal insufficiency, cardiomyopathy and peripheral neuropathy presents with acute onset confusion and motor apraxia after a viral illness over the weekend.  On exam, patient is oriented to situation today, but speech is still somewhat tangential and some confusion is still evident.  Strength testing is limited due to pain, but patient has good antigravity strength in all 4 extremities. CTH negative. EEG shows no seizure activity so far. LP with no pleocytosis, elevated protein but not concerning for infection.  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   Recommendations: 1) Continue thiamine supplementation 2) Continue IC Solumedrol x 5 doses with last dose on 11/14.  Will sign off today, will remain available if any questions or concerns arise.   Christene Slates, MD   Attending Neurohospitalist Addendum Patient seen and examined with APP/Resident. Agree with the history and physical as documented above. Agree with the plan as documented, which I helped formulate. I have independently reviewed the chart, obtained history, review of systems and examined the patient.I have personally reviewed pertinent head/neck/spine imaging (CT/MRI). Please feel free to call with any questions.  Discussed with Dr. Roderic Palau  -- Amie Portland, MD Neurologist [facility] Pager: [phone]

## 2022-05-26 NOTE — Evaluation (Signed)
Physical Therapy Evaluation Patient Details Name: Amber White MRN: [id number] DOB: September 09, 1975 Today's Date: 05/26/2022  History of Present Illness  46 year old patient with history of breast cancer status post chemo and radiation now undergoing breast reconstructive surgery, renal insufficiency, obesity, cardiomyopathy and peripheral neuropathy presents with confusion and motor apraxia following viral illness at home with N/V/D.  Clinical Impression  Patient presents with decreased mobility due to deficits listed in PT problem list.  Patient currently mobilizing with minguard to S without device in the hallway.  Patient previously independent and working.  Still slower cognition and mild imbalance/weakness.  Discussed with daughter having initial 24/7 assist and follow up outpatient PT.  Also initiated energy conservation education.  PT will continue to follow until d/c and attempt steps next visit.      Recommendations for follow up therapy are one component of a multi-disciplinary discharge planning process, led by the attending physician.  Recommendations may be updated based on patient status, additional functional criteria and insurance authorization.  Follow Up Recommendations Outpatient PT      Assistance Recommended at Discharge Intermittent Supervision/Assistance  Patient can return home with the following  A little help with walking and/or transfers;A little help with bathing/dressing/bathroom;Direct supervision/assist for medications management;Assist for transportation;Help with stairs or ramp for entrance;Direct supervision/assist for financial management;Assistance with cooking/housework    Equipment Recommendations BSC/3in1  Recommendations for Other Services       Functional Status Assessment Patient has had a recent decline in their functional status and demonstrates the ability to make significant improvements in function in a reasonable and predictable amount of time.      Precautions / Restrictions Precautions Precautions: Fall      Mobility  Bed Mobility               General bed mobility comments: up in recliner following shower    Transfers Overall transfer level: Needs assistance Equipment used: None Transfers: Sit to/from Stand Sit to Stand: Supervision           General transfer comment: assist for safety    Ambulation/Gait Ambulation/Gait assistance: Supervision, Min guard Gait Distance (Feet): 180 Feet Assistive device: None Gait Pattern/deviations: Step-through pattern, Decreased stride length, Wide base of support, Shuffle       General Gait Details: decreaserd foot clearance and decreased balance with minguard for safety to close [employer]    Modified Rankin (Stroke Patients Only)       Balance Overall balance assessment: Needs assistance Sitting-balance support: Feet supported Sitting balance-Leahy Scale: Good Sitting balance - Comments: leaning down to attempt to don sock, but fatigued, no LOB   Standing balance support: No upper extremity supported Standing balance-Leahy Scale: Good Standing balance comment: ambulatory no device                             Pertinent Vitals/Pain Pain Assessment Pain Assessment: No/denies pain    Home Living Family/patient expects to be discharged to:: Private residence Living Arrangements: Children Available Help at Discharge: Family Type of Home: House Home Access: Level entry     Alternate Level Stairs-Number of Steps: 15 Home Layout: Two level Home Equipment: None      Prior Function Prior Level of Function : Independent/Modified Independent               ADLs Comments: cooks, cleans, works at [employer]        Extremity/Trunk Assessment   Upper Extremity Assessment Upper Extremity Assessment: Defer to OT evaluation    Lower Extremity Assessment Lower  Extremity Assessment: Generalized weakness    Cervical / Trunk Assessment Cervical / Trunk Assessment: Kyphotic  Communication   Communication: No difficulties  Cognition Arousal/Alertness: Awake/alert Behavior During Therapy: WFL for tasks assessed/performed Overall Cognitive Status: Impaired/Different from baseline Area of Impairment: Orientation, Following commands                 Orientation Level: Situation     Following Commands: Follows one step commands with increased time, Follows multi-step commands with increased time                General Comments General comments (skin integrity, edema, etc.): Daughter in the room and discussed needing support initially x 24 hours for possibly 4 days    Exercises     Assessment/Plan    PT Assessment Patient needs continued PT services  PT Problem List Decreased strength;Decreased balance;Decreased cognition;Decreased knowledge of precautions;Decreased knowledge of use of DME;Decreased mobility;Decreased activity tolerance;Decreased safety awareness;Cardiopulmonary status limiting activity       PT Treatment Interventions Balance training;DME instruction;Modalities;Patient/family education;Functional mobility training;Gait training;Therapeutic activities;Neuromuscular re-education;Wheelchair mobility training    PT Goals (Current goals can be found in the Care Plan section)  Acute Rehab PT Goals Patient Stated Goal: to go home soon PT Goal Formulation: With patient/family Time For Goal Achievement: 06/09/22 Potential to Achieve Goals: Good    Frequency Min 3X/week     Co-evaluation               AM-PAC PT "6 Clicks" Mobility  Outcome Measure Help needed turning from your back to your side while in a flat bed without using bedrails?: A Little Help needed moving from lying on your back to sitting on the side of a flat bed without using bedrails?: A Little Help needed moving to and from a bed to a chair  (including a wheelchair)?: A Little Help needed standing up from a chair using your arms (e.g., wheelchair or bedside chair)?: A Little Help needed to walk in hospital room?: A Little Help needed climbing 3-5 steps with a railing? : Total 6 Click Score: 16    End of Session Equipment Utilized During Treatment: Gait belt Activity Tolerance: Patient tolerated treatment well Patient left: in chair;with family/visitor present   PT Visit Diagnosis: Other abnormalities of gait and mobility (R26.89);Muscle weakness (generalized) (M62.81);Other symptoms and signs involving the nervous system (R29.898)    Time: 0938-1829 PT Time Calculation (min) (ACUTE ONLY): 25 min   Charges:   PT Evaluation $PT Eval Moderate Complexity: 1 Mod PT Treatments $Gait Training: 8-22 mins        Magda Kiel, PT Acute Rehabilitation Services Office:[phone] 05/26/2022   Reginia Naas 05/26/2022, 4:29 PM

## 2022-05-27 DIAGNOSIS — R410 Disorientation, unspecified: Secondary | ICD-10-CM | POA: Diagnosis not present

## 2022-05-27 DIAGNOSIS — I10 Essential (primary) hypertension: Secondary | ICD-10-CM | POA: Diagnosis not present

## 2022-05-27 DIAGNOSIS — Z853 Personal history of malignant neoplasm of breast: Secondary | ICD-10-CM | POA: Diagnosis not present

## 2022-05-27 DIAGNOSIS — R29898 Other symptoms and signs involving the musculoskeletal system: Secondary | ICD-10-CM | POA: Diagnosis not present

## 2022-05-27 LAB — CYTOLOGY - NON PAP

## 2022-05-27 LAB — ACTH: C206 ACTH: <1.5 pg/mL — ABNORMAL LOW (ref 7.2–63.3)

## 2022-05-27 MED ORDER — HYDROCORTISONE 5 MG PO TABS
5.0000 mg | ORAL_TABLET | Freq: Every day | ORAL | Status: DC
  Administered 2022-05-27: 5 mg via ORAL
  Filled 2022-05-27 (×2): qty 1

## 2022-05-27 MED ORDER — HYDROCORTISONE 5 MG PO TABS: 5.0000 mg | ORAL_TABLET | Freq: Two times a day (BID) | ORAL | Status: DC

## 2022-05-27 MED ORDER — HYDROCORTISONE 10 MG PO TABS
10.0000 mg | ORAL_TABLET | Freq: Every day | ORAL | Status: DC
  Administered 2022-05-28: 10 mg via ORAL
  Filled 2022-05-27: qty 1

## 2022-05-27 NOTE — Progress Notes (Signed)
Neurology Progress Note  Brief HPI: 46 year old patient with history of breast cancer status post chemo and radiation now undergoing breast reconstructive surgery, renal insufficiency, obesity, cardiomyopathy and peripheral neuropathy presents with confusion and motor apraxia.  Over last weekend, she had nausea, vomiting and diarrhea and felt like she had a viral infection.  On Thursday, she was evaluated in the ED with concerns for pneumonia on CT chest.  After discharge from the ED she began to have hallucinations and had difficulty ambulating.  Patient is now oriented to person and place, but begins talking about furniture when asked if she understands why she is in the hospital.    Subjective: Patient states she is feeling well today and feels that she has improved since starting steroids.  She is oriented to person place and time, and reports no complaints.  Exam: Vitals:   05/27/22 0608 05/27/22 0806  BP: 128/76 139/87  Pulse: 83 71  Resp: 18 17  Temp: 98 F (36.7 C) 98.2 F (36.8 C)  SpO2: 100% 93%   Gen: In bed, NAD Resp: non-labored breathing, no acute distress  Neuro: Mental Status: Alert and oriented to person, place and situation.  States year correctly, but says it is January when asked for month. Recall and concentration are poor, only able to name 5 animals with 4 feet recalls 1/3 words after 5 minutes. Pleasant and cooperative on exam. There is some paucity of speech and delay when asked questions or asked to perform 3 step commands.  Only able to follow three-step commands with difficulty and prompting.  Not dysarthric.    Cranial Nerves: Pupils equal round and reactive, extraocular movements intact, facial sensation symmetric, hearing intact, shoulder shrug symmetric, phonation normal, tongue midline Motor: Tone and bulk are normal, and patient has full strength in bilateral upper extremities, able to lift bilateral lower extremities easily off the bed today. Sensory:  Intact to light touch in all 4 extremities DTR: Unable to elicit Gait: Deferred  Pertinent Labs:    Latest Ref Rng & Units 05/25/2022    9:49 AM 05/23/2022    4:41 AM 05/22/2022   11:11 PM  CBC  WBC 4.0 - 10.5 K/uL 5.1  4.4  4.1   Hemoglobin 12.0 - 15.0 g/dL 9.6  10.8  10.6   Hematocrit 36.0 - 46.0 % 28.5  32.8  32.4   Platelets 150 - 400 K/uL 198  230  229        Latest Ref Rng & Units 05/25/2022    9:49 AM 05/24/2022    7:10 AM 05/23/2022    4:41 AM  BMP  Glucose 70 - 99 mg/dL 154  172  110   BUN 6 - 20 mg/dL '10  10  9   '$ Creatinine 0.44 - 1.00 mg/dL 0.66  0.59  0.72   Sodium 135 - 145 mmol/L 139  135  136   Potassium 3.5 - 5.1 mmol/L 3.6  3.5  3.9   Chloride 98 - 111 mmol/L 108  103  107   CO2 22 - 32 mmol/L '21  18  17   '$ Calcium 8.9 - 10.3 mg/dL 9.2  9.0  9.6   TSH 1.486 T4 0.75  Autoimmune encephalitis panel pending HIV negative Thyroid antibodies negative MMA 107 Thiamine pending B12 1454 Ammonia less than 10 LP with no pleocytosis, protein elevated to 119, meningitis panel negative.  Imaging Reviewed:  CT head: No acute intracranial abnormality  Unable to perform MRI due to incompatible breast tissue  expanders  Assessment: 46 year old patient with history of breast cancer status postchemotherapy and radiation, renal insufficiency, cardiomyopathy and peripheral neuropathy presents with acute onset confusion and motor apraxia after a viral illness over the weekend.  On exam, patient is oriented to situation today, but speech is still somewhat tangential and some confusion is still evident.  Strength testing is limited due to pain, but patient has good antigravity strength in all 4 extremities. CTH negative. EEG shows no seizure activity so far. LP with no pleocytosis, elevated protein but not concerning for infection.  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   Recommendations: 1) Continue thiamine supplementation 2) Continue IC Solumedrol x 5 doses with last dose on 11/14.  Will sign off today, will remain available if any questions or concerns arise.   [place] , MSN, AGACNP-BC [facility] See Amion for schedule and pager information 05/27/2022 8:43 AM   --- Case was discussed with me. Agree with the plan. -- Amie [place], MD Neurologist [facility] Pager: [phone]

## 2022-05-27 NOTE — Progress Notes (Signed)
Physical Therapy Treatment and Discharge Patient Details Name: Amber White MRN: [id number] DOB: Dec 09, 1975 Today's Date: 05/27/2022   History of Present Illness 46 year old patient with history of breast cancer status post chemo and radiation now undergoing breast reconstructive surgery, renal insufficiency, obesity, cardiomyopathy and peripheral neuropathy presents with confusion and motor apraxia following viral illness at home with N/V/D.    PT Comments    Continuing work on functional mobility and activity tolerance;  Pt able to walk the hallways well, practiced stairs; moving well enough to dc home; PT goals met; Questions solicited and answered; OK for dc home from PT standpoint     Recommendations for follow up therapy are one component of a multi-disciplinary discharge planning process, led by the attending physician.  Recommendations may be updated based on patient status, additional functional criteria and insurance authorization.  Follow Up Recommendations  Outpatient PT (not necessary to start Outp tPT immediately; pt can wait to start Outpt PT, and in that case, Outpt PT can be arranged through PCP)     Assistance Recommended at Discharge Intermittent Supervision/Assistance  Patient can return home with the following A little help with walking and/or transfers;A little help with bathing/dressing/bathroom;Direct supervision/assist for medications management;Assist for transportation;Help with stairs or ramp for entrance;Direct supervision/assist for financial management;Assistance with cooking/housework   Equipment Recommendations  None recommended by PT    Recommendations for Other Services       Precautions / Restrictions Precautions Precautions: Fall Precaution Comments: Fall risk present, but minimal Restrictions Weight Bearing Restrictions: No     Mobility  Bed Mobility               General bed mobility comments: EOB upon arrival    Transfers    Equipment used: None Transfers: Sit to/from Stand Sit to Stand: Modified independent (Device/Increase time)                Ambulation/Gait Ambulation/Gait assistance: Modified independent (Device/Increase time) Gait Distance (Feet): 200 Feet Assistive device: None Gait Pattern/deviations: Decreased step length - right, Decreased step length - left       General Gait Details: slower gait   Stairs Stairs: Yes Stairs assistance: Supervision, Modified independent (Device/Increase time) Stair Management: One rail Right, Alternating pattern, Forwards Number of Stairs: 6 General stair comments: took steps slowly, butr overall no difficutly; discussed possibility of sleeping down stairs if needed   Wheelchair Mobility    Modified Rankin (Stroke Patients Only)       Balance     Sitting balance-Leahy Scale: Good       Standing balance-Leahy Scale: Good                              Cognition Arousal/Alertness: Awake/alert Behavior During Therapy: WFL for tasks assessed/performed Overall Cognitive Status: Within Functional Limits for tasks assessed                                          Exercises      General Comments General comments (skin integrity, edema, etc.): Pt's mother and daughter present      Pertinent Vitals/Pain Pain Assessment Pain Assessment: No/denies pain    Home Living                          Prior Function  PT Goals (current goals can now be found in the care plan section) Acute Rehab PT Goals Patient Stated Goal: to go home soon Progress towards PT goals: Goals met/education completed, patient discharged from PT    Frequency    Min 3X/week      PT Plan Current plan remains appropriate    Co-evaluation              AM-PAC PT "6 Clicks" Mobility   Outcome Measure  Help needed turning from your back to your side while in a flat bed without using bedrails?:  None Help needed moving from lying on your back to sitting on the side of a flat bed without using bedrails?: None Help needed moving to and from a bed to a chair (including a wheelchair)?: None Help needed standing up from a chair using your arms (e.g., wheelchair or bedside chair)?: None Help needed to walk in hospital room?: None Help needed climbing 3-5 steps with a railing? : A Little 6 Click Score: 23    End of Session   Activity Tolerance: Patient tolerated treatment well Patient left: in chair;with family/visitor present Nurse Communication: Mobility status PT Visit Diagnosis: Other abnormalities of gait and mobility (R26.89);Muscle weakness (generalized) (M62.81);Other symptoms and signs involving the nervous system (R29.898)     Time: 8648-4720 PT Time Calculation (min) (ACUTE ONLY): 18 min  Charges:  $Gait Training: 8-22 mins                     Roney Marion, [place] Office [place] 05/27/2022, 5:22 PM

## 2022-05-27 NOTE — Progress Notes (Signed)
PROGRESS NOTE    Amber White  EVO:[id number] DOB: 05-15-1976 DOA: 05/22/2022 PCP: [name], MD     Brief Narrative:   high-grade right breast invasive ductal carcinoma diagnosed in 10/2020 treated with chemotherapy, radiation, bilateral mastectomies, and maintenance pembrolizumab.  She underwent breast reconstructive surgery in 03/2022.  Also has history of hypertension, adrenal insufficiency, cardiomyopathy, and obesity.  She presented to the ED for vomiting, diarrhea, and headache   Subjective:  She feels well. 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 - S/p LP, CSF meningitis/encephalitis panel negative, CSF culture negative, csf VRDL negative,   -CSF oligoclonal bands, CSF IgG pending -Seen by neurology ,Started on high does solumedrol since  11/10, completed 5 days of treatment on 11/14, will not require taper.  -she has greatly improved -B1 level in process, continue thiamine supplementation  GNR Bacteremia -reports ate salad brought home by her daughter, then developed ab pain , n/v/d, was seen in the ED on 11/8 -Blood culture 1/4 bottles with GNR , BCID negative, started meropenem on 11/11, further identification still pending.  Hypokalemia/Hypomagnesemia Replaced and improved, recheck in the morning  Hypertension Home medication bisoprolol held, due to low normal BP, appear dehydrated, start hydration -Monitor blood pressure closely   History of adrenal insufficiency In the setting of previous cancer treatment.  Per pharmacy med rec, she is no longer on steroids. 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 -Outpatient oncology follow-up   Obesity, class 2  Body mass index is 37.12 kg/m.Marland Kitchen     Skin Assessment: I have examined the patient's skin and I agree with the wound assessment as performed by the wound care RN as outlined below: Pressure Injury 05/23/22 Sacrum Mid Stage 2 -  Partial thickness loss of dermis presenting as a shallow open injury with a red, pink wound bed without slough. (Active)  05/23/22 2200  Location: Sacrum  Location Orientation: Mid  Staging: Stage 2 -  Partial thickness loss of dermis presenting as a shallow open injury with a red, pink wound bed without slough.  Wound Description (Comments):   Present on Admission: Yes  Dressing Type None (Pt deferred dressing change) 05/26/22 0600     I have Reviewed nursing notes, Vitals, pain scores, I/o's, Lab results and  imaging results since pt's last encounter, details please see discussion above  I ordered the following labs:  Unresulted Labs (From admission, onward)     Start     Ordered   05/24/22 0500  ACTH  Tomorrow morning,   R        05/23/22 2158   05/23/22 1448  IgG CSF index  Once,   TIMED        05/23/22 1448   05/23/22 0443  Miscellaneous [facility] test (send-out)  Once,   URGENT       Comments: Autoimmune Encephalitis Ab panel - CSF   Question Answer Comment  Test name / description: Autoimmune Encephalitis Ab panel - CSF   Release to  patient Immediate      05/23/22 0444   05/23/22 0443  Draw extra clot tube  (Oligoclonal Bands, CSF + Serum panel)  Once,   URGENT        05/23/22 0444   05/23/22 0443  Miscellaneous [facility] test (send-out)  Once,   URGENT       Comments: Paraneoplastic panel CSF   Question Answer Comment  Test name / description: Paraneoplastic panel CSF    Release to patient Immediate      05/23/22 0444   05/23/22 0443  Miscellaneous [facility] test (send-out)  Once,   URGENT       Comments: Paraneoplastic panel serum   Question Answer Comment  Test name / description: Paraneoplastic panel serum   Release to patient Immediate      05/23/22 0444   05/23/22 0443  Draw extra clot tube  (IgG CSF Index, CSF + Serum panel)  Once,   URGENT        05/23/22 0444   05/22/22 2351  Urinalysis, Routine w reflex microscopic  Once,   URGENT        05/22/22 2350   05/22/22 2302  Vitamin B1  Once,   URGENT        05/22/22 2301   05/22/22 2302  Methylmalonic acid, serum  Once,   URGENT        05/22/22 2301   05/21/22 2112  Organism ID, Bacteria  Once,   R        05/21/22 2112   05/21/22 2112  Susceptibility, Aer + Anaerob  Once,   R        05/21/22 2112             DVT prophylaxis: SCDs Start: 05/23/22 0649, lovenox   Code Status:   Code Status: Full Code  Family Communication: discussed with patient  Disposition:   Dispo: The patient is from: Home              Anticipated d/c is to: Home              Anticipated d/c date is: f/u blood culture result, currently on IV antibiotics  Antimicrobials:    Anti-infectives (From admission, onward)    Start     Dose/Rate Route Frequency Ordered Stop   05/24/22 1745  meropenem (MERREM) 1 g in sodium chloride 0.9 % 100 mL IVPB        1 g 200 mL/hr over 30 Minutes Intravenous Every 8 hours 05/24/22 1659            Objective: Vitals:   05/25/22 1634 05/25/22 2057 05/26/22 0615 05/26/22 0953  BP: 110/73 114/77 126/79 136/86  Pulse: 84 81 80 73  Resp: '18 18 18 17  '$ Temp: 98.5 F (36.9 C) 97.7 F (36.5 C) 98 F (36.7 C) 98.1 F (36.7 C)  TempSrc: Oral Oral Oral Oral  SpO2: 100% 100% 99% 94%  Weight:      Height:        Intake/Output Summary (Last 24 hours) at 05/26/2022 1511 Last data filed at 05/26/2022 0200 Gross per 24 hour  Intake 890.34 ml  Output 0 ml  Net 890.34 ml    Filed Weights   05/22/22 2132 05/24/22 0500  Weight: 97.3 kg 98.1 kg    Examination:  General exam: alert, awake, communicative,calm, NAD Respiratory system: Clear to auscultation. Respiratory effort normal. Cardiovascular system:  RRR.  Gastrointestinal system: Abdomen is nondistended, soft and nontender.  Normal bowel sounds heard.  Central nervous system: Alert and oriented. No focal neurological deficits. Extremities:  no edema Skin: No rashes, lesions or ulcers Psychiatry: Judgement and insight appear normal. Mood & affect appropriate.     Data Reviewed: I have personally reviewed  labs and visualized  imaging studies since the last encounter and formulate the plan        Scheduled Meds:  enoxaparin (LOVENOX) injection  50 mg Subcutaneous Q24H   pantoprazole (PROTONIX) IV  40 mg Intravenous Q24H   thiamine (VITAMIN B1) injection  100 mg Intravenous Q24H   Continuous Infusions:  lactated ringers 75 mL/hr at 05/26/22 1308   meropenem (MERREM) IV 1 g (05/26/22 1035)   methylPREDNISolone (SOLU-MEDROL) injection 1,000 mg (05/25/22 2052)     LOS: 3 days     Kathie Dike, MD  [facility]  Available via Epic secure chat 7am-7pm for nonurgent issues Please page for urgent issues To page the attending provider between 7A-7P or the covering provider during after hours 7P-7A, please log into the web site [email or web address] and access using universal [place] password for that web site. If you do not have the password, please call the hospital operator.    05/26/2022, 3:11 PM

## 2022-05-27 NOTE — TOC Progression Note (Signed)
Transition of Care [facility]) - Progression Note    Patient Details  Name: Amber White MRN: [id number] Date of Birth: 24-Sep-1975  [facility] CM/SW Contact  Tom-Johnson, Renea Ee, RN Phone Number: 05/27/2022, 3:05 PM  Clinical Narrative:     CM spoke with patient at bedside about outpatient recommendations. PT was at bedside as well. Patient declined outpatient PT at this time stating she will request an order from her PCP when she is ready. CM will continue to follow with care.      Barriers to Discharge: Continued Medical Work up  Expected Discharge Plan and Services     Discharge Planning Services: CM Consult   Living arrangements for the past 2 months: Single Family Home                                       Social Determinants of Health (SDOH) Interventions    Readmission Risk Interventions     No data to display

## 2022-05-28 DIAGNOSIS — R410 Disorientation, unspecified: Secondary | ICD-10-CM | POA: Diagnosis not present

## 2022-05-28 DIAGNOSIS — L89152 Pressure ulcer of sacral region, stage 2: Secondary | ICD-10-CM | POA: Diagnosis not present

## 2022-05-28 DIAGNOSIS — Z853 Personal history of malignant neoplasm of breast: Secondary | ICD-10-CM | POA: Diagnosis not present

## 2022-05-28 DIAGNOSIS — R29898 Other symptoms and signs involving the musculoskeletal system: Secondary | ICD-10-CM | POA: Diagnosis not present

## 2022-05-28 LAB — VITAMIN B1: Vitamin B1 (Thiamine): 54.7 nmol/L — ABNORMAL LOW (ref 66.5–200.0)

## 2022-05-28 MED ORDER — THIAMINE HCL 100 MG PO TABS: 100.0000 mg | ORAL_TABLET | Freq: Every day | ORAL | 0 refills | Status: AC

## 2022-05-28 MED ORDER — LEVOFLOXACIN 750 MG PO TABS: 750.0000 mg | ORAL_TABLET | Freq: Every day | ORAL | 0 refills | Status: AC

## 2022-05-28 NOTE — Discharge Summary (Addendum)
Physician Discharge Summary  Vidalia Serpas ZOX:[id number] DOB: 1975/09/11 DOA: 05/22/2022  PCP: [name], MD  Admit date: 05/22/2022 Discharge date: 05/28/2022  Admitted From: Home  Discharge disposition: Home   Recommendations for Outpatient Follow-Up:   Follow up with your primary care provider in one week.  Check CBC, BMP, magnesium in the next visit Follow vitamin B-1 levels, oligoclonal bands on the CSF which is still pending.  Discharge Diagnosis:   Principal Problem:   Lower extremity weakness Active Problems:   History of breast cancer   Upper extremity weakness   Confusion   Hypomagnesemia   HTN (hypertension)   Pressure injury of skin Gram-negative rod bacteremia.   Discharge Condition: Improved.  Diet recommendation: Low sodium, heart healthy.    Wound care: None.  Code status: Full.   History of Present Illness:   46 years old female with past medical history of high-grade right breast invasive ductal carcinoma diagnosed in 10/2020 treated with chemotherapy, radiation, bilateral mastectomies, and maintenance pembrolizumab, [place] post breast reconstructive surgery in 03/2022,hypertension, adrenal insufficiency, cardiomyopathy, and obesity who had presented to the ED with back with nausea vomiting headache.  At that time  CT scan of the abdomen pelvis was done and there was some concerning for left lung base pneumonia and was given a course of moxifloxacin on discharge but she continued to have bilateral proximal muscle weakness and difficulty walking confusion and aphasia so was brought back to the hospital.  Hospital Course:   Following conditions were addressed during hospitalization as listed below,  Confusion/metabolic encephalopathy secondary to possible pneumonia, bacteremia. Resolved at this time.  Patient is fully alert awake oriented at the time of discharge.  Patient was seen by neurology during hospitalization and underwent lumbar puncture  to rule out meningitis encephalitis.  CSF was negative including VDRL. CSF oligoclonal bands, CSF IgG pending.  Meningitis encephalitis (panel was negative.  Patient was empirically treated with a high-dose of Solu-Medrol since 05/23/2022 and completed 5-day course.  At this time patient has significantly improved.  Continue thiamine on discharge.  Vitamin B1 level still pending at the time of discharge.  Lower extremity weakness.  Improved after IV Solu-Medrol.  Physical therapy has recommended outpatient PT on discharge.   GNR Bacteremia Blood culture 1/4 bottles with GNR , patient was started on meropenem and sensitivity pending at the time of discharge but  spoke with pharmacy who recommends Levaquin for 7 to 10 days on discharge.  We will continue Levaquin for next 7 days complete the course.Marland Kitchen     Hypokalemia/Hypomagnesemia Replenished and improved   Hypertension On bisoprolol from home.  Will be resumed on discharge.   History of adrenal insufficiency We will resume hydrocortisone from home.  Blood pressure prior to discharge was 149/93.   History of breast cancer No longer on treatment.  Post bilateral mastectomy.  Follow-up with oncology as outpatient.   Obesity, class 2  Body mass index is 37.12 kg/m.Marland Kitchen  Would benefit from weight loss as outpatient.   Pressure injury sacral stage II present on admission.  Continue wound care on discharge. Pressure Injury 05/23/22 Sacrum Mid Stage 2 -  Partial thickness loss of dermis presenting as a shallow open injury with a red, pink wound bed without slough. (Active)  05/23/22 2200  Location: Sacrum  Location Orientation: Mid  Staging: Stage 2 -  Partial thickness loss of dermis presenting as a shallow open injury with a red, pink wound bed without slough.  Wound Description (Comments):  Present on Admission: Yes     Disposition.  At this time, patient is stable for disposition home with outpatient PCP follow-up.  Spoke with the patient's  family at bedside.  Medical Consultants:   Neurology  Procedures:    Lumbar puncture.  On 05/23/2022 EEG on 05/23/22  Subjective:   Today, patient was seen and examined at bedside.  Denies any headache nausea vomiting fever or chills.  Wishes to go home.  Discharge Exam:   Vitals:   05/28/22 0518 05/28/22 0800  BP: 136/88 (!) 149/93  Pulse: 72 66  Resp: 17 15  Temp: 98.6 F (37 C) 98.6 F (37 C)  SpO2: 97% 93%   Vitals:   05/27/22 1516 05/27/22 2138 05/28/22 0518 05/28/22 0800  BP: 136/87 118/76 136/88 (!) 149/93  Pulse: 70 74 72 66  Resp: '17 17 17 15  '$ Temp: 98.2 F (36.8 C) 98 F (36.7 C) 98.6 F (37 C) 98.6 F (37 C)  TempSrc: Oral Oral Oral Oral  SpO2: 95% 93% 97% 93%  Weight:      Height:       General: Alert awake, not in obvious distress HENT: pupils equally reacting to light,  No scleral pallor or icterus noted. Oral mucosa is moist.  Chest:  Clear breath sounds.  Diminished breath sounds bilaterally. No crackles or wheezes.  CVS: S1 &S2 heard. No murmur.  Regular rate and rhythm. Abdomen: Soft, nontender, nondistended.  Bowel sounds are heard.   Extremities: No cyanosis, clubbing or edema.  Peripheral pulses are palpable. Psych: Alert, awake and oriented, normal mood CNS:  No cranial nerve deficits.  Moves all extremities. Skin: Warm and dry.  Pressure injury of the sacral region stage II  The results of significant diagnostics from this hospitalization (including imaging, microbiology, ancillary and laboratory) are listed below for reference.     Diagnostic Studies:   DG FL GUIDED LUMBAR PUNCTURE  Result Date: 05/23/2022 CLINICAL DATA:  46 year old female history of breast cancer. Presented [facility] the ED at [facility] with nausea, vomiting, and diarrhea. Patient was discharged and readmitted with confusion and aphasia. Team is requesting lumbar puncture for further evaluation. EXAM: DIAGNOSTIC LUMBAR PUNCTURE UNDER FLUOROSCOPIC GUIDANCE COMPARISON:   None Available. FLUOROSCOPY: Radiation Exposure Index (as provided by the fluoroscopic device): 11 mGy Kerma PROCEDURE: Informed consent was obtained from the patient prior to the procedure, including potential complications of headache, allergy, and pain. With the patient prone, the lower back was prepped with Betadine. 1% Lidocaine was used for local anesthesia. Lumbar puncture was performed at the L4-5 level using a 20 gauge 5 inch needle with return of clear CSF with an opening pressure of 15 cm water. 9 mL of CSF were obtained for laboratory studies. The patient tolerated the procedure well and there were no apparent complications. This exam was performed by Rushie Nyhan, NP, and was supervised and interpreted by Logan Bores, MD. IMPRESSION: Technically successful fluoroscopically guided lumbar puncture. Electronically Signed   By: Logan Bores M.D.   On: 05/23/2022 15:54   Overnight EEG with video  Result Date: 05/23/2022 Lora Havens, MD     05/24/2022  9:45 AM Patient Name: Remmie Bembenek MRN: [id number] Epilepsy Attending: Lora Havens Referring Physician/Provider: Lorenza Chick, MD Duration: 05/23/2022 0453 to 05/24/2022 0453 Patient history: 46 year old female with acute onset bilateral upper and lower extremity weakness, confusion, hallucinations.  EEG to evaluate for seizure. Level of alertness: Awake, asleep AEDs during EEG study: None Technical aspects: This EEG  study was done with scalp electrodes positioned according to the 10-20 International system of electrode placement. Electrical activity was reviewed with band pass filter of 1-'70Hz'$ , sensitivity of 7 uV/mm, display speed of 12m/sec with a '60Hz'$  notched filter applied as appropriate. EEG data were recorded continuously and digitally stored.  Video monitoring was available and reviewed as appropriate. Description: The posterior dominant rhythm consists of 8 Hz activity of moderate voltage (25-35 uV) seen predominantly in  posterior head regions, symmetric and reactive to eye opening and eye closing. Sleep was characterized by vertex waves, sleep spindles (12 to 14 Hz), maximal frontocentral region. EEG showed continuous generalized polymorphic 3 to 6 Hz theta-delta slowing. Hyperventilation and photic stimulation were not performed.   ABNORMALITY - Continuous slow, generalized IMPRESSION: This study is suggestive of moderate diffuse encephalopathy, nonspecific etiology. No seizures or epileptiform discharges were seen throughout the recording. PLora Havens  CT Head Wo Contrast  Result Date: 05/22/2022 CLINICAL DATA:  Dizziness, persistent/recurrent, cardiac or vascular cause suspected Hospital discharge today. EXAM: CT HEAD WITHOUT CONTRAST TECHNIQUE: Contiguous axial images were obtained from the base of the skull through the vertex without intravenous contrast. RADIATION DOSE REDUCTION: This exam was performed according to the departmental dose-optimization program which includes automated exposure control, adjustment of the mA and/or kV according to patient size and/or use of iterative reconstruction technique. COMPARISON:  Brain MRI 05/24/2021 FINDINGS: Brain: No evidence of acute infarction, hemorrhage, hydrocephalus, extra-axial collection or mass lesion/mass effect. Vascular: No hyperdense vessel or unexpected calcification. Skull: Normal. Negative for fracture or focal lesion. Sinuses/Orbits: Minimal mucosal thickening of scattered ethmoid air cells. No mastoid effusion. Unremarkable orbits. Other: None. IMPRESSION: No acute intracranial abnormality. Electronically Signed   By: MKeith RakeM.D.   On: 05/22/2022 23:18   CT ABDOMEN PELVIS W CONTRAST  Result Date: 05/22/2022 CLINICAL DATA:  Abdominal pain, nausea/vomiting EXAM: CT ABDOMEN AND PELVIS WITH CONTRAST TECHNIQUE: Multidetector CT imaging of the abdomen and pelvis was performed using the standard protocol following bolus administration of intravenous  contrast. RADIATION DOSE REDUCTION: This exam was performed according to the departmental dose-optimization program which includes automated exposure control, adjustment of the mA and/or kV according to patient size and/or use of iterative reconstruction technique. CONTRAST:  1025mOMNIPAQUE IOHEXOL 300 MG/ML  SOLN COMPARISON:  11/14/2020 FINDINGS: Lower chest: Focal patchy opacity the left lung base, suspicious for pneumonia. Hepatobiliary: Liver is within normal limits. Status post cholecystectomy. No intrahepatic or extrahepatic ductal dilatation. Pancreas: Within normal limits. Spleen: Within normal limits Adrenals/Urinary Tract: Adrenal glands are within normal limits. Kidneys are normal limits.  No hydronephrosis. Bladder is within normal limits. Stomach/Bowel: Stomach is within normal limits. No evidence of bowel obstruction. Normal appendix (series 2/image 1). No colonic wall thickening or inflammatory changes. Vascular/Lymphatic: No evidence of abdominal aortic aneurysm. No suspicious abdominopelvic lymphadenopathy. Reproductive: Uterus is within normal limits. No adnexal masses. Other: No abdominopelvic ascites. Musculoskeletal: Visualized osseous structures are within normal limits. IMPRESSION: Focal patchy opacity at the left lung base, suspicious for pneumonia. Otherwise negative CT abdomen/pelvis. Electronically Signed   By: SrJulian Hy.D.   On: 05/22/2022 02:58     Labs:   Basic Metabolic Panel: Recent Labs  Lab 05/21/22 2315 05/22/22 2311 05/23/22 0441 05/24/22 0710 05/25/22 0949  NA 138 136 136 135 139  K 3.8 4.0 3.9 3.5 3.6  CL 108 107 107 103 108  CO2 21* 20* 17* 18* 21*  GLUCOSE 70 98 110* 172* 154*  BUN 11 11  $'9 10 10  'd$ CREATININE 0.63 0.65 0.72 0.59 0.66  CALCIUM 9.9 9.7 9.6 9.0 9.2  MG 1.5* 1.4*  --  1.5* 2.2   GFR Estimated Creatinine Clearance: 97.9 mL/min (by C-G formula based on SCr of 0.66 mg/dL). Liver Function Tests: Recent Labs  Lab 05/21/22 2315  05/22/22 2311  AST 26 24  ALT 16 13  ALKPHOS 44 42  BILITOT 0.9 0.7  PROT 7.8 8.0  ALBUMIN 3.7 3.6   No results for input(s): "LIPASE", "AMYLASE" in the last 168 hours. Recent Labs  Lab 05/22/22 2311  AMMONIA <10   Coagulation profile No results for input(s): "INR", "PROTIME" in the last 168 hours.  CBC: Recent Labs  Lab 05/21/22 2315 05/22/22 2311 05/23/22 0441 05/25/22 0949  WBC 5.5 4.1 4.4 5.1  NEUTROABS 2.8 2.0  --  4.3  HGB 11.2* 10.6* 10.8* 9.6*  HCT 35.1* 32.4* 32.8* 28.5*  MCV 90.7 90.0 90.6 88.0  PLT 259 229 230 198   Cardiac Enzymes: Recent Labs  Lab 05/22/22 2311  CKTOTAL 171   BNP: Invalid input(s): "POCBNP" CBG: Recent Labs  Lab 05/21/22 1959 05/21/22 2111  GLUCAP 90 71   D-Dimer No results for input(s): "DDIMER" in the last 72 hours. Hgb A1c No results for input(s): "HGBA1C" in the last 72 hours. Lipid Profile No results for input(s): "CHOL", "HDL", "LDLCALC", "TRIG", "CHOLHDL", "LDLDIRECT" in the last 72 hours. Thyroid function studies No results for input(s): "TSH", "T4TOTAL", "T3FREE", "THYROIDAB" in the last 72 hours.  Invalid input(s): "FREET3" Anemia work up No results for input(s): "VITAMINB12", "FOLATE", "FERRITIN", "TIBC", "IRON", "RETICCTPCT" in the last 72 hours. Microbiology Recent Results (from the past 240 hour(s))  Resp Panel by RT-PCR (Flu A&B, Covid)     Status: None   Collection Time: 05/21/22  9:12 PM   Specimen: Nasal Swab  Result Value Ref Range Status   SARS Coronavirus 2 by RT PCR NEGATIVE NEGATIVE Final    Comment: (NOTE) SARS-CoV-2 target nucleic acids are NOT DETECTED.  The SARS-CoV-2 RNA is generally detectable in upper respiratory specimens during the acute phase of infection. The lowest concentration of SARS-CoV-2 viral copies this assay can detect is 138 copies/mL. A negative result does not preclude SARS-Cov-2 infection and should not be used as the sole basis for treatment or other patient  management decisions. A negative result may occur with  improper specimen collection/handling, submission of specimen other than nasopharyngeal swab, presence of viral mutation(s) within the areas targeted by this assay, and inadequate number of viral copies(<138 copies/mL). A negative result must be combined with clinical observations, patient history, and epidemiological information. The expected result is Negative.  Fact Sheet for Patients:  EntrepreneurPulse.com.au  Fact Sheet for Healthcare Providers:  IncredibleEmployment.be  This test is no t yet approved or cleared by the [place] FDA and  has been authorized for detection and/or diagnosis of SARS-CoV-2 by FDA under an Emergency Use Authorization (EUA). This EUA will remain  in effect (meaning this test can be used) for the duration of the COVID-19 declaration under Section 564(b)(1) of the Act, 21 U.S.C.section 360bbb-3(b)(1), unless the authorization is terminated  or revoked sooner.       Influenza A by PCR NEGATIVE NEGATIVE Final   Influenza B by PCR NEGATIVE NEGATIVE Final    Comment: (NOTE) The Xpert Xpress SARS-CoV-2/FLU/RSV plus assay is intended as an aid in the diagnosis of influenza from Nasopharyngeal swab specimens and should not be used as a sole basis for treatment. Nasal  washings and aspirates are unacceptable for Xpert Xpress SARS-CoV-2/FLU/RSV testing.  Fact Sheet for Patients: EntrepreneurPulse.com.au  Fact Sheet for Healthcare Providers: IncredibleEmployment.be  This test is not yet approved or cleared by the [place] FDA and has been authorized for detection and/or diagnosis of SARS-CoV-2 by FDA under an Emergency Use Authorization (EUA). This EUA will remain in effect (meaning this test can be used) for the duration of the COVID-19 declaration under Section 564(b)(1) of the Act, 21 U.S.C. section 360bbb-3(b)(1),  unless the authorization is terminated or revoked.  Performed at [facility], [place] [address], [place], [place] [address]   Culture, blood (routine x 2)     Status: None (Preliminary result)   Collection Time: 05/21/22  9:12 PM   Specimen: BLOOD RIGHT ARM  Result Value Ref Range Status   Specimen Description   Final    BLOOD RIGHT ARM Performed at [facility] [address], [place], [place] [address]    Special Requests   Final    BOTTLES DRAWN AEROBIC AND ANAEROBIC Blood Culture adequate volume Performed at [facility] [address], [place], [place] [address]    Culture  Setup Time   Final    GRAM NEGATIVE RODS AEROBIC BOTTLE ONLY Organism ID to follow CRITICAL RESULT CALLED TO, READ BACK BY AND VERIFIED WITH: S.[email or web address],PHARMD'@2132'$  05/23/22 [place]    Culture   Final    GRAM NEGATIVE RODS SENT TO [facility] FOR FURTHER WORK UP Performed at [facility], [place] [address], [place],  [address]    Report Status PENDING  Incomplete  Blood Culture ID Panel (Reflexed)     Status: None   Collection Time: 05/21/22  9:12 PM  Result Value Ref Range Status   Enterococcus faecalis NOT DETECTED NOT DETECTED Final   Enterococcus Faecium NOT DETECTED NOT DETECTED Final   Listeria monocytogenes NOT DETECTED NOT DETECTED Final   Staphylococcus species NOT DETECTED NOT DETECTED Final   Staphylococcus aureus (BCID) NOT DETECTED NOT DETECTED Final   Staphylococcus epidermidis NOT DETECTED NOT DETECTED Final   Staphylococcus lugdunensis NOT DETECTED NOT DETECTED Final   Streptococcus species NOT DETECTED NOT DETECTED Final   Streptococcus agalactiae NOT DETECTED NOT DETECTED Final   Streptococcus pneumoniae NOT DETECTED NOT DETECTED Final   Streptococcus pyogenes NOT DETECTED NOT DETECTED Final   A.calcoaceticus-baumannii NOT DETECTED NOT DETECTED Final   Bacteroides fragilis NOT DETECTED NOT DETECTED Final   Enterobacterales  NOT DETECTED NOT DETECTED Final   Enterobacter cloacae complex NOT DETECTED NOT DETECTED Final   Escherichia coli NOT DETECTED NOT DETECTED Final   Klebsiella aerogenes NOT DETECTED NOT DETECTED Final   Klebsiella oxytoca NOT DETECTED NOT DETECTED Final   Klebsiella pneumoniae NOT DETECTED NOT DETECTED Final   Proteus species NOT DETECTED NOT DETECTED Final   Salmonella species NOT DETECTED NOT DETECTED Final   Serratia marcescens NOT DETECTED NOT DETECTED Final   Haemophilus influenzae NOT DETECTED NOT DETECTED Final   Neisseria meningitidis NOT DETECTED NOT DETECTED Final   Pseudomonas aeruginosa NOT DETECTED NOT DETECTED Final   Stenotrophomonas maltophilia NOT DETECTED NOT DETECTED Final   Candida albicans NOT DETECTED NOT DETECTED Final   Candida auris NOT DETECTED NOT DETECTED Final   Candida glabrata NOT DETECTED NOT DETECTED Final   Candida krusei NOT DETECTED NOT DETECTED Final   Candida parapsilosis NOT DETECTED NOT DETECTED Final   Candida tropicalis NOT DETECTED NOT DETECTED Final   Cryptococcus neoformans/gattii NOT DETECTED NOT DETECTED  Final    Comment: Performed at [facility], [place] [address], [place], [place] [address]  Culture, blood (routine x 2)     Status: None   Collection Time: 05/21/22  9:25 PM   Specimen: BLOOD  Result Value Ref Range Status   Specimen Description   Final    BLOOD BLOOD RIGHT ARM Performed at [facility] [address], [place], [place] [address]    Special Requests   Final    BOTTLES DRAWN AEROBIC AND ANAEROBIC Blood Culture results may not be optimal due to an inadequate volume of blood received in culture bottles Performed at [facility] [address], [place], [place] [address]    Culture   Final    NO GROWTH 5 DAYS Performed at [facility], [place] [address], [place], [place] [address]    Report Status 05/26/2022 FINAL  Final  SARS Coronavirus 2 by RT PCR (hospital order,  performed in [facility] hospital lab) *cepheid single result test*     Status: None   Collection Time: 05/22/22  9:17 PM  Result Value Ref Range Status   SARS Coronavirus 2 by RT PCR NEGATIVE NEGATIVE Final    Comment: (NOTE) SARS-CoV-2 target nucleic acids are NOT DETECTED.  The SARS-CoV-2 RNA is generally detectable in upper and lower respiratory specimens during the acute phase of infection. The lowest concentration of SARS-CoV-2 viral copies this assay can detect is 250 copies / mL. A negative result does not preclude SARS-CoV-2 infection and should not be used as the sole basis for treatment or other patient management decisions.  A negative result may occur with improper specimen collection / handling, submission of specimen other than nasopharyngeal swab, presence of viral mutation(s) within the areas targeted by this assay, and inadequate number of viral copies (<250 copies / mL). A negative result must be combined with clinical observations, patient history, and epidemiological information.  Fact Sheet for Patients:   https://www.patel.info/  Fact Sheet for Healthcare Providers: https://hall.com/  This test is not yet approved or  cleared by the [place] FDA and has been authorized for detection and/or diagnosis of SARS-CoV-2 by FDA under an Emergency Use Authorization (EUA).  This EUA will remain in effect (meaning this test can be used) for the duration of the COVID-19 declaration under Section 564(b)(1) of the Act, 21 U.S.C. section 360bbb-3(b)(1), unless the authorization is terminated or revoked sooner.  Performed at [facility], [place] [address], [place], [place] [address]   CSF culture w Gram Stain     Status: None   Collection Time: 05/23/22  2:48 PM   Specimen: PATH Cytology CSF; Cerebrospinal Fluid  Result Value Ref Range Status   Specimen Description CSF  Final   Special Requests NONE  Final   Gram  Stain   Final    WBC PRESENT, PREDOMINANTLY PMN NO ORGANISMS SEEN CYTOSPIN SMEAR    Culture   Final    NO GROWTH Performed at [facility], [place] [address], [place], [place] [address]    Report Status 05/26/2022 FINAL  Final     Discharge Instructions:   Discharge Instructions     Call MD for:  severe uncontrolled pain   Complete by: As directed    Call MD for:  temperature >100.4   Complete by: As directed    Diet - low sodium heart healthy   Complete by: As directed    Discharge instructions  Complete by: As directed    Follow up with your primary care provider in one week. Check blood work at that time.Seek medical attention for worsening symptoms.   Discharge wound care:   Complete by: As directed    Wound care to Stage 2 pressure injury to sacrum: Cleanse with NS, pat dry,. Cover with folded piece of xeroform gauze Kellie Simmering # 294), top with dry gauze 2x2 and secure with silicone forma dressing for sacrum. Change xeroform daily, may reuse silicone foam for up to 3 days. Change PRN soiling.   Increase activity slowly   Complete by: As directed       Allergies as of 05/28/2022       Reactions   Paclitaxel Nausea Only, Other (See Comments)   Complained of chest burning and nausea. Resolved with famotidine, methylprednisolone, and diphenhydramine. Resumed paclitaxel and completed without further incident.    Penicillins Hives        Medication List     STOP taking these medications    moxifloxacin 400 MG tablet Commonly known as: AVELOX       TAKE these medications    bisoprolol 10 MG tablet Commonly known as: ZEBETA TAKE 1 TABLET BY MOUTH EVERY DAY   hydrocortisone 5 MG tablet Commonly known as: CORTEF TAKE 1 TABLET BY MOUTH IN THE MORNING AND 1/2 TABLET DAILY BEFORE SUPPER.   levofloxacin 750 MG tablet Commonly known as: Levaquin Take 1 tablet (750 mg total) by mouth daily for 7 days.   ondansetron 4 MG tablet Commonly known as:  ZOFRAN Take 1 tablet (4 mg total) by mouth every 6 (six) hours.   thiamine 100 MG tablet Commonly known as: VITAMIN B1 Take 1 tablet (100 mg total) by mouth daily.               Discharge Care Instructions  (From admission, onward)           Start     Ordered   05/28/22 0000  Discharge wound care:       Comments: Wound care to Stage 2 pressure injury to sacrum: Cleanse with NS, pat dry,. Cover with folded piece of xeroform gauze Kellie Simmering # 294), top with dry gauze 2x2 and secure with silicone forma dressing for sacrum. Change xeroform daily, may reuse silicone foam for up to 3 days. Change PRN soiling.   05/28/22 1058              Time coordinating discharge: 39 minutes  Signed:  Deshawn Witty  [facility] 05/28/2022, 12:35 PM

## 2022-05-28 NOTE — Progress Notes (Signed)
AVS given and explained to patient and family.

## 2022-05-28 NOTE — TOC Transition Note (Signed)
Transition of Care [facility]) - CM/SW Discharge Note   Patient Details  Name: Amber White MRN: [id number] Date of Birth: 27-May-1976  Transition of Care [facility]) CM/SW Contact:  Tom-Johnson, Renea Ee, RN Phone Number: 05/28/2022, 12:24 PM   Clinical Narrative:     Patient is scheduled for discharge today. Declines Outpatient PT. States she will get Outpatient PT order from her PCP when she is ready. Family to transport at discharge. No further TOC needs noted.    Final next level of care: Home/Self Care Barriers to Discharge: Barriers Resolved   Patient Goals and CMS Choice Patient states their goals for this hospitalization and ongoing recovery are:: To return home CMS Medicare.gov Compare Post Acute Care list provided to:: Patient Choice offered to / list presented to : Patient  Discharge Placement                Patient to be transferred to facility by: Family      Discharge Plan and Services   Discharge Planning Services: CM Consult            DME Arranged: N/A DME Agency: NA       HH Arranged: NA HH Agency: NA        Social Determinants of Health (SDOH) Interventions     Readmission Risk Interventions     No data to display

## 2022-05-28 NOTE — Plan of Care (Signed)

## 2022-05-29 LAB — BACTERIAL ORGANISM REFLEX

## 2022-05-29 LAB — ORGANISM ID, BACTERIA

## 2022-05-30 ENCOUNTER — Encounter (HOSPITAL_COMMUNITY): Payer: Self-pay

## 2022-05-30 ENCOUNTER — Other Ambulatory Visit: Payer: Self-pay

## 2022-05-30 ENCOUNTER — Ambulatory Visit (HOSPITAL_COMMUNITY)
Admission: AD | Admit: 2022-05-30 | Discharge: 2022-05-30 | Disposition: A | Discharge status: Home / Self Care | Payer: No Typology Code available for payment source | Attending: Psychiatry | Admitting: Psychiatry

## 2022-05-30 ENCOUNTER — Inpatient Hospital Stay (HOSPITAL_COMMUNITY)
Admission: EM | Admit: 2022-05-30 | Discharge: 2022-06-13 | DRG: 064 | Disposition: E | Payer: No Typology Code available for payment source | Attending: Student | Admitting: Student

## 2022-05-30 DIAGNOSIS — I469 Cardiac arrest, cause unspecified: Secondary | ICD-10-CM | POA: Diagnosis not present

## 2022-05-30 DIAGNOSIS — Z833 Family history of diabetes mellitus: Secondary | ICD-10-CM

## 2022-05-30 DIAGNOSIS — I4510 Unspecified right bundle-branch block: Secondary | ICD-10-CM | POA: Diagnosis present

## 2022-05-30 DIAGNOSIS — J690 Pneumonitis due to inhalation of food and vomit: Secondary | ICD-10-CM | POA: Diagnosis present

## 2022-05-30 DIAGNOSIS — R000 Tachycardia, unspecified: Secondary | ICD-10-CM | POA: Diagnosis present

## 2022-05-30 DIAGNOSIS — R739 Hyperglycemia, unspecified: Secondary | ICD-10-CM | POA: Diagnosis present

## 2022-05-30 DIAGNOSIS — Z923 Personal history of irradiation: Secondary | ICD-10-CM

## 2022-05-30 DIAGNOSIS — I6340 Cerebral infarction due to embolism of unspecified cerebral artery: Secondary | ICD-10-CM | POA: Diagnosis not present

## 2022-05-30 DIAGNOSIS — R482 Apraxia: Secondary | ICD-10-CM | POA: Diagnosis present

## 2022-05-30 DIAGNOSIS — K7200 Acute and subacute hepatic failure without coma: Secondary | ICD-10-CM | POA: Diagnosis present

## 2022-05-30 DIAGNOSIS — Z515 Encounter for palliative care: Secondary | ICD-10-CM

## 2022-05-30 DIAGNOSIS — G936 Cerebral edema: Secondary | ICD-10-CM | POA: Diagnosis not present

## 2022-05-30 DIAGNOSIS — E221 Hyperprolactinemia: Secondary | ICD-10-CM | POA: Diagnosis present

## 2022-05-30 DIAGNOSIS — Z66 Do not resuscitate: Secondary | ICD-10-CM | POA: Diagnosis present

## 2022-05-30 DIAGNOSIS — G43909 Migraine, unspecified, not intractable, without status migrainosus: Secondary | ICD-10-CM | POA: Diagnosis present

## 2022-05-30 DIAGNOSIS — Z9012 Acquired absence of left breast and nipple: Secondary | ICD-10-CM

## 2022-05-30 DIAGNOSIS — Z8249 Family history of ischemic heart disease and other diseases of the circulatory system: Secondary | ICD-10-CM

## 2022-05-30 DIAGNOSIS — R9431 Abnormal electrocardiogram [ECG] [EKG]: Secondary | ICD-10-CM | POA: Diagnosis present

## 2022-05-30 DIAGNOSIS — Z853 Personal history of malignant neoplasm of breast: Secondary | ICD-10-CM

## 2022-05-30 DIAGNOSIS — Z1152 Encounter for screening for COVID-19: Secondary | ICD-10-CM

## 2022-05-30 DIAGNOSIS — Z9049 Acquired absence of other specified parts of digestive tract: Secondary | ICD-10-CM

## 2022-05-30 DIAGNOSIS — R443 Hallucinations, unspecified: Secondary | ICD-10-CM | POA: Diagnosis present

## 2022-05-30 DIAGNOSIS — F32A Depression, unspecified: Secondary | ICD-10-CM | POA: Insufficient documentation

## 2022-05-30 DIAGNOSIS — R4584 Anhedonia: Secondary | ICD-10-CM | POA: Diagnosis present

## 2022-05-30 DIAGNOSIS — I468 Cardiac arrest due to other underlying condition: Secondary | ICD-10-CM | POA: Diagnosis present

## 2022-05-30 DIAGNOSIS — I82890 Acute embolism and thrombosis of other specified veins: Secondary | ICD-10-CM | POA: Diagnosis present

## 2022-05-30 DIAGNOSIS — R579 Shock, unspecified: Secondary | ICD-10-CM | POA: Diagnosis present

## 2022-05-30 DIAGNOSIS — Z9221 Personal history of antineoplastic chemotherapy: Secondary | ICD-10-CM

## 2022-05-30 DIAGNOSIS — J9601 Acute respiratory failure with hypoxia: Secondary | ICD-10-CM | POA: Diagnosis present

## 2022-05-30 DIAGNOSIS — Z803 Family history of malignant neoplasm of breast: Secondary | ICD-10-CM

## 2022-05-30 DIAGNOSIS — D62 Acute posthemorrhagic anemia: Secondary | ICD-10-CM | POA: Diagnosis present

## 2022-05-30 DIAGNOSIS — E2740 Unspecified adrenocortical insufficiency: Secondary | ICD-10-CM | POA: Diagnosis present

## 2022-05-30 DIAGNOSIS — E872 Acidosis, unspecified: Secondary | ICD-10-CM | POA: Diagnosis present

## 2022-05-30 DIAGNOSIS — G935 Compression of brain: Secondary | ICD-10-CM | POA: Diagnosis not present

## 2022-05-30 DIAGNOSIS — Z880 Allergy status to penicillin: Secondary | ICD-10-CM

## 2022-05-30 DIAGNOSIS — R569 Unspecified convulsions: Secondary | ICD-10-CM | POA: Diagnosis present

## 2022-05-30 DIAGNOSIS — Z888 Allergy status to other drugs, medicaments and biological substances status: Secondary | ICD-10-CM

## 2022-05-30 DIAGNOSIS — E876 Hypokalemia: Secondary | ICD-10-CM | POA: Diagnosis present

## 2022-05-30 DIAGNOSIS — G931 Anoxic brain damage, not elsewhere classified: Secondary | ICD-10-CM | POA: Diagnosis not present

## 2022-05-30 DIAGNOSIS — R400 Somnolence: Principal | ICD-10-CM

## 2022-05-30 DIAGNOSIS — Z79899 Other long term (current) drug therapy: Secondary | ICD-10-CM

## 2022-05-30 DIAGNOSIS — I10 Essential (primary) hypertension: Secondary | ICD-10-CM | POA: Diagnosis present

## 2022-05-30 DIAGNOSIS — R946 Abnormal results of thyroid function studies: Secondary | ICD-10-CM | POA: Diagnosis present

## 2022-05-30 LAB — IGG CSF INDEX
Albumin CSF-mCnc: 73 mg/dL — ABNORMAL HIGH (ref 8–37)
Albumin: 3.9 g/dL (ref 3.9–4.9)
CSF IgG Index: 0.6 (ref 0.0–0.7)
IgG (Immunoglobin G), Serum: 1724 mg/dL — ABNORMAL HIGH (ref 586–1602)
IgG, CSF: 20.9 mg/dL — ABNORMAL HIGH (ref 0.0–6.7)
IgG/Alb Ratio, CSF: 0.29 — ABNORMAL HIGH (ref 0.00–0.25)

## 2022-05-30 LAB — CBC WITH DIFFERENTIAL/PLATELET
Abs Immature Granulocytes: 0.04 10*3/uL (ref 0.00–0.07)
Basophils Absolute: 0 10*3/uL (ref 0.0–0.1)
Basophils Relative: 0 %
Eosinophils Absolute: 0.1 10*3/uL (ref 0.0–0.5)
Eosinophils Relative: 2 %
HCT: 36.2 % (ref 36.0–46.0)
Hemoglobin: 12 g/dL (ref 12.0–15.0)
Immature Granulocytes: 1 %
Lymphocytes Relative: 26 %
Lymphs Abs: 2.3 10*3/uL (ref 0.7–4.0)
MCH: 29.2 pg (ref 26.0–34.0)
MCHC: 33.1 g/dL (ref 30.0–36.0)
MCV: 88.1 fL (ref 80.0–100.0)
Monocytes Absolute: 0.4 10*3/uL (ref 0.1–1.0)
Monocytes Relative: 4 %
Neutro Abs: 6 10*3/uL (ref 1.7–7.7)
Neutrophils Relative %: 67 %
Platelets: 221 10*3/uL (ref 150–400)
RBC: 4.11 MIL/uL (ref 3.87–5.11)
RDW: 13.1 % (ref 11.5–15.5)
WBC: 8.8 10*3/uL (ref 4.0–10.5)
nRBC: 0 % (ref 0.0–0.2)

## 2022-05-30 LAB — COMPREHENSIVE METABOLIC PANEL
ALT: 31 U/L (ref 0–44)
AST: 20 U/L (ref 15–41)
Albumin: 3.4 g/dL — ABNORMAL LOW (ref 3.5–5.0)
Alkaline Phosphatase: 44 U/L (ref 38–126)
Anion gap: 9 (ref 5–15)
BUN: 13 mg/dL (ref 6–20)
CO2: 26 mmol/L (ref 22–32)
Calcium: 8.6 mg/dL — ABNORMAL LOW (ref 8.9–10.3)
Chloride: 100 mmol/L (ref 98–111)
Creatinine, Ser: 0.53 mg/dL (ref 0.44–1.00)
GFR, Estimated: >60 mL/min (ref >60)
Glucose, Bld: 117 mg/dL — ABNORMAL HIGH (ref 70–99)
Potassium: 2.9 mmol/L — ABNORMAL LOW (ref 3.5–5.1)
Sodium: 135 mmol/L (ref 135–145)
Total Bilirubin: 0.6 mg/dL (ref 0.3–1.2)
Total Protein: 7.2 g/dL (ref 6.5–8.1)

## 2022-05-30 LAB — CULTURE, BLOOD (ROUTINE X 2): Special Requests: ADEQUATE

## 2022-05-30 LAB — VDRL, CSF: VDRL Quant, CSF: NONREACTIVE

## 2022-05-30 LAB — MAGNESIUM: Magnesium: 1.8 mg/dL (ref 1.7–2.4)

## 2022-05-30 LAB — ETHANOL: Alcohol, Ethyl (B): <10 mg/dL (ref <10)

## 2022-05-30 LAB — OLIGOCLONAL BANDS, CSF + SERM

## 2022-05-30 MED ORDER — IBUPROFEN 200 MG PO TABS
600.0000 mg | ORAL_TABLET | Freq: Once | ORAL | Status: AC
  Administered 2022-05-30: 600 mg via ORAL
  Filled 2022-05-30: qty 3

## 2022-05-30 MED ORDER — POTASSIUM CHLORIDE CRYS ER 20 MEQ PO TBCR
60.0000 meq | EXTENDED_RELEASE_TABLET | Freq: Once | ORAL | Status: AC
  Administered 2022-05-30: 60 meq via ORAL
  Filled 2022-05-30: qty 3

## 2022-05-30 NOTE — ED Provider Notes (Signed)
[facility]-EMERGENCY DEPT Provider Note   CSN: [id number] Arrival date & time: 05/30/22  1825     History  Chief Complaint  Patient presents with   Depression    Amber White is a 46 y.o. female recently discharged from the care being diagnosed with pneumonia presenting today from behavioral health hospital for medical clearance.  She and family member reports that she has been sleeping 22 hours a day for the past couple of days.  She says she does not feel like herself.  No SI/HI, AVH or symptoms of dizziness.   Depression       Home Medications Prior to Admission medications   Medication Sig Start Date End Date Taking? Authorizing Provider  bisoprolol (ZEBETA) 10 MG tablet TAKE 1 TABLET BY MOUTH EVERY DAY Patient not taking: Reported on 05/22/2022 01/02/22   Amber Croissant, MD  hydrocortisone (CORTEF) 5 MG tablet TAKE 1 TABLET BY MOUTH IN THE MORNING AND 1/2 TABLET DAILY BEFORE SUPPER. Patient not taking: Reported on 05/22/2022 05/16/22   Amber Croissant, MD  levofloxacin (LEVAQUIN) 750 MG tablet Take 1 tablet (750 mg total) by mouth daily for 7 days. 05/28/22 06/04/22  White, Amber Chesterfield, MD  ondansetron (ZOFRAN) 4 MG tablet Take 1 tablet (4 mg total) by mouth every 6 (six) hours. 05/22/22   Gilda Crease, MD  thiamine (VITAMIN B1) 100 MG tablet Take 1 tablet (100 mg total) by mouth daily. 05/28/22 09/05/22  White, Amber Chesterfield, MD  prochlorperazine (COMPAZINE) 10 MG tablet Take 1 tablet (10 mg total) by mouth every 6 (six) hours as needed (Nausea or vomiting). 11/07/20 04/25/21  Amber Croissant, MD      Allergies    Paclitaxel and Penicillins    Review of Systems   Review of Systems  Psychiatric/Behavioral:  Positive for behavioral problems and depression. Negative for agitation, confusion, hallucinations, self-injury, sleep disturbance and suicidal ideas. The patient is not nervous/anxious and is not hyperactive.     Physical Exam Updated Vital Signs BP  (!) 134/95 (BP Location: Left Arm)   Pulse 78   Temp 98.5 F (36.9 C) (Oral)   Resp 20   Ht 5\' 4"  (1.626 m)   Wt 98 kg   LMP 10/26/2020 Comment: UPT neg DOS  SpO2 100%   BMI 37.09 kg/m  Physical Exam Vitals and nursing note reviewed.  Constitutional:      Appearance: Normal appearance.  HENT:     Head: Normocephalic and atraumatic.  Eyes:     General: No scleral icterus.    Conjunctiva/sclera: Conjunctivae normal.  Cardiovascular:     Rate and Rhythm: Normal rate and regular rhythm.  Pulmonary:     Effort: Pulmonary effort is normal. No respiratory distress.     Breath sounds: No wheezing or rales.  Skin:    General: Skin is warm and dry.     Findings: No rash.  Neurological:     Mental Status: She is alert.  Psychiatric:        Mood and Affect: Mood normal.        Behavior: Behavior normal.     ED Results / Procedures / Treatments   Labs (all labs ordered are listed, but only abnormal results are displayed) Labs Reviewed  COMPREHENSIVE METABOLIC PANEL - Abnormal; Notable for the following components:      Result Value   Potassium 2.9 (*)    Glucose, Bld 117 (*)    Calcium 8.6 (*)    Albumin 3.4 (*)  All other components within normal limits  ETHANOL  CBC WITH DIFFERENTIAL/PLATELET  MAGNESIUM  RAPID URINE DRUG SCREEN, HOSP PERFORMED  PREGNANCY, URINE    EKG None  Radiology No results found.  Procedures Procedures   Medications Ordered in ED Medications  potassium chloride SA (KLOR-CON M) CR tablet 60 mEq (has no administration in time range)  ibuprofen (ADVIL) tablet 600 mg (600 mg Oral Given 05/30/22 1954)    ED Course/ Medical Decision Making/ A&P Clinical Course as of 05/31/22 0013  Fri May 30, 2022  2218 Lab to run ethanol now [MR]  2308 Patient says she will try to get a urine sample.  I provided her with a And family member ambulated to the restroom [MR]  2332 Asked nurse tech for EKG given patient's recent hospitalization and  potassium of 2.9. RN made aware as well [MR]    Clinical Course User Index [MR] Amber White, Gabriel Cirri, PA-C                           Medical Decision Making Amount and/or Complexity of Data Reviewed Labs: ordered.  Risk OTC drugs. Prescription drug management.   46 year old female presenting for medical clearance.  Recently discharged from hospital after being admitted for metabolic encephalopathy.  I reviewed this chart and she was stable when she was discharged home.  Of note, on my physical exam she is alert and oriented, providing her own history.  Work-up: Potassium 2.9.  UDS pending.  MDM/disposition: 46 year old female presenting for medical clearance from [facility].  Alert and oriented and able to ambulate.  Currently having difficulty urinating so UDS is still pending.  Despite this, patient is clinically sober and medically cleared for TTS evaluation.  Potassium was replaced, EKG stable and there is no other medical intervention needed at this time   Final Clinical Impression(s) / ED Diagnoses Final diagnoses:  Somnolence      Amber White, Gabriel Cirri, PA-C 05/31/22 0014    Linwood Dibbles, MD 06/02/22 [phone]

## 2022-05-30 NOTE — H&P (Signed)
Behavioral Health Medical Screening Exam   Amber White is a 46 y.o. female with no documented past psychiatric history who presents voluntarily to [facility] as a walk-in, accompanied by her mother Oneonta Blas, [phone]), for concern of her mental health.  The majority of the patient's history was provided by the mother.  Per mother, patient was recently discharged from the hospital on Wednesday and ever since her discharge, patient has not been eating, sleeping, or drinking.  Patient's mother states that the patient mostly sleeps and does not do anything. She states that patient won't even shower.  She also reports that the patient is not interested in interacting with her daughters.  Patient's mother states that this all started last Thursday after the patient was recently treated for pneumonia after being assessed at [facility] ED.  Patient was discharged from [facility] last Thursday and placed on antibiotics.  Patient's mother states that shortly after the patient was discharged, the patient began "Talking out of her head" and experiencing hallucinations.  Patient was taken back to [facility] ED where she was transferred over to [facility] ED and hospitalized.  During the patient's hospitalization, spinal fluid was extracted and she was found to have elevated prolactin.  Patient's mother states that the providers in charge of the course of her care were concerned that the prolactin would have a negative impact on her brain.  During her hospitalization, patient's mother states that the patient was given steroids to combat her elevated prolactin level.  She reports that the patient was discharged this past Wednesday and ever since discharge, patient has not been drinking.  She reports that the only time the patient will drink is when she has a cup of water when taking her medications.  Patient's mother notes that the patient has also not been talking as much.  She recalls  that the patient talked about dying and that if she was dying, she would rather die at home.  During the assessment, patient expressed that she was tired.  She denied depression stating "I just do not feel good."  Patient reports that she has not felt well for the past few days.  She reports that she feels that she could eat but the thought of eating makes her feel sick.  Patient endorses the following symptoms: decreased energy, decreased appetite, decreased concentration, and irritability.  She denies low mood, weight loss/weight gain, psychomotor retardation, memory issues, feelings of guilt/worthlessness, hopelessness, or recurrent thoughts of death.  Patient denies anxiety and further denies any new stressors.  Patient denies confusion or altered mental status.  Patient's mother reports that the patient was dealing with a bout of breast cancer and had to receive a double mastectomy last September.  Patient denies a past history of hospitalization due to mental health.  Patient denies past history of self-harm.  She denies suicidal or homicidal ideations.  She further denies auditory or visual hallucinations and does not appear to be responding to internal/external stimuli.  Patient's mother reports that the patient sleeps too much stating that she receives roughly 20 hours of sleep a day.  Patient endorses decreased appetite.  Patient denies alcohol consumption, tobacco use, or illicit drug use.  Prior to the onset of the patient's condition, patient's mother states that the patient was very active and worked a full-time job but now, patient has not been working for the past 2 weeks.  Total Time spent with patient: 30 minutes  Psychiatric Specialty Exam:  Presentation  General Appearance:  Appropriate for Environment; Casual; Other (comment) (Patient is ill-appearing)  Eye Contact: Good  Speech: Clear and Coherent; Other (comment) (Minimal, most of the history provided by the mother)  Speech  Volume: Normal  Handedness: Right   Mood and Affect  Mood: Euthymic  Affect: Congruent   Thought Process  Thought Processes: Coherent  Descriptions of Associations:Intact  Orientation:Full (Time, Place and Person)  Thought Content:Logical  History of Schizophrenia/Schizoaffective disorder:No data recorded Duration of Psychotic Symptoms:No data recorded Hallucinations:Hallucinations: None  Ideas of Reference:None  Suicidal Thoughts:Suicidal Thoughts: No  Homicidal Thoughts:Homicidal Thoughts: No   Sensorium  Memory: Immediate Good; Recent Good; Remote Fair  Judgment: Good  Insight: Fair   Executive Functions  Concentration: Good  Attention Span: Good  Recall: [place] of Knowledge: Good  Language: Good   Psychomotor Activity  Psychomotor Activity: Psychomotor Activity: Decreased   Assets  Assets: Communication Skills; Desire for Improvement; Housing; Social Support; Vocational/Educational   Sleep  Sleep: Sleep: Fair (Patient has been sleeping too much)    Physical Exam: Physical Exam Constitutional:      Appearance: She is ill-appearing.  HENT:     Head: Normocephalic and atraumatic.     Nose: Nose normal.  Eyes:     Extraocular Movements: Extraocular movements intact.     Pupils: Pupils are equal, round, and reactive to light.  Cardiovascular:     Rate and Rhythm: Normal rate and regular rhythm.  Pulmonary:     Effort: Pulmonary effort is normal.     Breath sounds: Normal breath sounds.  Abdominal:     General: Abdomen is flat.  Musculoskeletal:        General: Normal range of motion.     Cervical back: Normal range of motion and neck supple.  Skin:    General: Skin is warm and dry.  Neurological:     Mental Status: She is alert.  Psychiatric:        Attention and Perception: Attention and perception normal. She does not perceive auditory or visual hallucinations.        Mood and Affect: Affect is blunt.         Speech: Speech normal.        Behavior: Behavior is slowed. Behavior is cooperative.        Thought Content: Thought content normal. Thought content is not paranoid or delusional. Thought content does not include homicidal or suicidal ideation.        Cognition and Memory: Cognition and memory normal.        Judgment: Judgment normal.    Review of Systems  Constitutional:  Positive for malaise/fatigue.  HENT: Negative.    Eyes: Negative.   Respiratory: Negative.    Cardiovascular: Negative.   Gastrointestinal: Negative.   Skin: Negative.   Neurological: Negative.   Psychiatric/Behavioral:  Negative for depression, hallucinations, substance abuse and suicidal ideas. The patient is not nervous/anxious. Insomnia: Per patient's mother, patient has been sleeping too much.   Blood pressure (!) 124/96, pulse 90, temperature 98.3 F (36.8 C), temperature source Oral, resp. rate 16, last menstrual period 10/26/2020, SpO2 100 %. There is no height or weight on file to calculate BMI.  Musculoskeletal: Strength & Muscle Tone: within normal limits Gait & Station: normal Patient leans: N/A  [place] Scale:  Flowsheet Row ED to Hosp-Admission (Discharged) from 05/22/2022 in [place] [facility] from 03/24/2022 in [facility] PREADMISSION TESTING ED from 03/19/2022  in [facility] [place] No Risk No Risk No Risk       Recommendations:  Based on my evaluation the patient appears to have an emergency medical condition for which I recommend the patient be transferred to the emergency department for further evaluation.  Although patient's mother reports that the patient has not been eating, sleeping, drinking, or showering; patient denied experiencing depressive symptoms or anxiety.  Patient further denied suicidal or homicidal ideations.  She denied auditory or visual hallucinations and did not appear  to be responding to internal/external stimuli.  Patient's issues do not appear to be attributed to mental health and does not meet criteria for inpatient psychiatric admission.  Provider recommended patient be transferred over to the ED to be medically cleared.  Malachy Mood, PA 05/29/2022, 5:27 PM

## 2022-05-30 NOTE — ED Triage Notes (Signed)
Patient sent over from [facility] for medical clearance. Patient has been depressed, sleeping 22/24 hours of the day, not eating. No suicidal thoughts.

## 2022-05-31 ENCOUNTER — Emergency Department (HOSPITAL_COMMUNITY): Payer: No Typology Code available for payment source

## 2022-05-31 ENCOUNTER — Telehealth: Payer: Self-pay | Admitting: Student in an Organized Health Care Education/Training Program

## 2022-05-31 DIAGNOSIS — I469 Cardiac arrest, cause unspecified: Secondary | ICD-10-CM | POA: Diagnosis present

## 2022-05-31 DIAGNOSIS — G936 Cerebral edema: Secondary | ICD-10-CM | POA: Diagnosis not present

## 2022-05-31 DIAGNOSIS — R443 Hallucinations, unspecified: Secondary | ICD-10-CM | POA: Diagnosis present

## 2022-05-31 DIAGNOSIS — I82890 Acute embolism and thrombosis of other specified veins: Secondary | ICD-10-CM | POA: Diagnosis present

## 2022-05-31 DIAGNOSIS — R569 Unspecified convulsions: Secondary | ICD-10-CM | POA: Diagnosis present

## 2022-05-31 DIAGNOSIS — R579 Shock, unspecified: Secondary | ICD-10-CM | POA: Diagnosis present

## 2022-05-31 DIAGNOSIS — G931 Anoxic brain damage, not elsewhere classified: Secondary | ICD-10-CM | POA: Diagnosis not present

## 2022-05-31 DIAGNOSIS — F32A Depression, unspecified: Secondary | ICD-10-CM | POA: Diagnosis present

## 2022-05-31 DIAGNOSIS — E2740 Unspecified adrenocortical insufficiency: Secondary | ICD-10-CM | POA: Diagnosis present

## 2022-05-31 DIAGNOSIS — Z79899 Other long term (current) drug therapy: Secondary | ICD-10-CM | POA: Diagnosis not present

## 2022-05-31 DIAGNOSIS — R4182 Altered mental status, unspecified: Secondary | ICD-10-CM | POA: Diagnosis not present

## 2022-05-31 DIAGNOSIS — D62 Acute posthemorrhagic anemia: Secondary | ICD-10-CM | POA: Diagnosis present

## 2022-05-31 DIAGNOSIS — G935 Compression of brain: Secondary | ICD-10-CM | POA: Diagnosis not present

## 2022-05-31 DIAGNOSIS — E872 Acidosis, unspecified: Secondary | ICD-10-CM | POA: Diagnosis present

## 2022-05-31 DIAGNOSIS — K7200 Acute and subacute hepatic failure without coma: Secondary | ICD-10-CM | POA: Diagnosis present

## 2022-05-31 DIAGNOSIS — E221 Hyperprolactinemia: Secondary | ICD-10-CM | POA: Diagnosis present

## 2022-05-31 DIAGNOSIS — Z1152 Encounter for screening for COVID-19: Secondary | ICD-10-CM | POA: Diagnosis not present

## 2022-05-31 DIAGNOSIS — R9431 Abnormal electrocardiogram [ECG] [EKG]: Secondary | ICD-10-CM | POA: Diagnosis not present

## 2022-05-31 DIAGNOSIS — J9601 Acute respiratory failure with hypoxia: Secondary | ICD-10-CM | POA: Diagnosis present

## 2022-05-31 DIAGNOSIS — I214 Non-ST elevation (NSTEMI) myocardial infarction: Secondary | ICD-10-CM | POA: Diagnosis not present

## 2022-05-31 DIAGNOSIS — I468 Cardiac arrest due to other underlying condition: Secondary | ICD-10-CM | POA: Diagnosis present

## 2022-05-31 DIAGNOSIS — I10 Essential (primary) hypertension: Secondary | ICD-10-CM | POA: Diagnosis present

## 2022-05-31 DIAGNOSIS — E876 Hypokalemia: Secondary | ICD-10-CM | POA: Diagnosis present

## 2022-05-31 DIAGNOSIS — I6340 Cerebral infarction due to embolism of unspecified cerebral artery: Secondary | ICD-10-CM | POA: Diagnosis present

## 2022-05-31 DIAGNOSIS — Z515 Encounter for palliative care: Secondary | ICD-10-CM | POA: Diagnosis not present

## 2022-05-31 DIAGNOSIS — Z66 Do not resuscitate: Secondary | ICD-10-CM | POA: Diagnosis present

## 2022-05-31 DIAGNOSIS — R7989 Other specified abnormal findings of blood chemistry: Secondary | ICD-10-CM | POA: Diagnosis not present

## 2022-05-31 DIAGNOSIS — J690 Pneumonitis due to inhalation of food and vomit: Secondary | ICD-10-CM | POA: Diagnosis present

## 2022-05-31 LAB — URINALYSIS, ROUTINE W REFLEX MICROSCOPIC
Bilirubin Urine: NEGATIVE
Glucose, UA: NEGATIVE mg/dL
Hgb urine dipstick: NEGATIVE
Ketones, ur: 20 mg/dL — AB
Leukocytes,Ua: NEGATIVE
Nitrite: NEGATIVE
Protein, ur: NEGATIVE mg/dL
Specific Gravity, Urine: 1.012 (ref 1.005–1.030)
pH: 8 (ref 5.0–8.0)

## 2022-05-31 LAB — CBC WITH DIFFERENTIAL/PLATELET
Abs Immature Granulocytes: 0.06 10*3/uL (ref 0.00–0.07)
Basophils Absolute: 0 10*3/uL (ref 0.0–0.1)
Basophils Relative: 0 %
Eosinophils Absolute: 0.1 10*3/uL (ref 0.0–0.5)
Eosinophils Relative: 2 %
HCT: 17.7 % — ABNORMAL LOW (ref 36.0–46.0)
HCT: 30.5 % — ABNORMAL LOW (ref 36.0–46.0)
Hemoglobin: 5.3 g/dL — CL (ref 12.0–15.0)
Hemoglobin: 9.5 g/dL — ABNORMAL LOW (ref 12.0–15.0)
Immature Granulocytes: 2 %
Lymphocytes Relative: 52 %
Lymphs Abs: 1.6 10*3/uL (ref 0.7–4.0)
MCH: 29.8 pg (ref 26.0–34.0)
MCH: 29.4 pg (ref 26.0–34.0)
MCHC: 29.9 g/dL — ABNORMAL LOW (ref 30.0–36.0)
MCHC: 31.1 g/dL (ref 30.0–36.0)
MCV: 99.4 fL (ref 80.0–100.0)
MCV: 94.4 fL (ref 80.0–100.0)
Monocytes Absolute: 0.1 10*3/uL (ref 0.1–1.0)
Monocytes Relative: 2 %
Neutro Abs: 1.3 10*3/uL — ABNORMAL LOW (ref 1.7–7.7)
Neutrophils Relative %: 42 %
Platelets: 102 10*3/uL — ABNORMAL LOW (ref 150–400)
Platelets: 167 10*3/uL (ref 150–400)
RBC: 1.78 MIL/uL — ABNORMAL LOW (ref 3.87–5.11)
RBC: 3.23 MIL/uL — ABNORMAL LOW (ref 3.87–5.11)
RDW: 13.7 % (ref 11.5–15.5)
RDW: 13.8 % (ref 11.5–15.5)
WBC: 3.2 10*3/uL — ABNORMAL LOW (ref 4.0–10.5)
WBC: 12.5 10*3/uL — ABNORMAL HIGH (ref 4.0–10.5)
nRBC: 0.6 % — ABNORMAL HIGH (ref 0.0–0.2)
nRBC: 0.4 % — ABNORMAL HIGH (ref 0.0–0.2)

## 2022-05-31 LAB — RAPID URINE DRUG SCREEN, HOSP PERFORMED
Amphetamines: NOT DETECTED
Barbiturates: NOT DETECTED
Benzodiazepines: NOT DETECTED
Cocaine: NOT DETECTED
Opiates: NOT DETECTED
Tetrahydrocannabinol: NOT DETECTED

## 2022-05-31 LAB — BLOOD GAS, ARTERIAL
Acid-base deficit: 21 mmol/L — ABNORMAL HIGH (ref 0.0–2.0)
Acid-base deficit: 3.7 mmol/L — ABNORMAL HIGH (ref 0.0–2.0)
Bicarbonate: 7.3 mmol/L — ABNORMAL LOW (ref 20.0–28.0)
Bicarbonate: 19 mmol/L — ABNORMAL LOW (ref 20.0–28.0)
Drawn by: 25788
FIO2: 100 %
MECHVT: 430 mL
O2 Saturation: <4 %
O2 Saturation: 99.9 %
Patient temperature: 35.4
Patient temperature: 34.2
RATE: 18 resp/min
pCO2 arterial: 22 mmHg — ABNORMAL LOW (ref 32–48)
pCO2 arterial: 25 mmHg — ABNORMAL LOW (ref 32–48)
pH, Arterial: 7.11 — CL (ref 7.35–7.45)
pH, Arterial: 7.48 — ABNORMAL HIGH (ref 7.35–7.45)
pO2, Arterial: <31 mmHg — CL (ref 83–108)
pO2, Arterial: 101 mmHg (ref 83–108)

## 2022-05-31 LAB — PREPARE RBC (CROSSMATCH)

## 2022-05-31 LAB — COMPREHENSIVE METABOLIC PANEL
ALT: 758 U/L — ABNORMAL HIGH (ref 0–44)
AST: 813 U/L — ABNORMAL HIGH (ref 15–41)
Albumin: 2 g/dL — ABNORMAL LOW (ref 3.5–5.0)
Alkaline Phosphatase: 76 U/L (ref 38–126)
Anion gap: 12 (ref 5–15)
BUN: 10 mg/dL (ref 6–20)
CO2: 21 mmol/L — ABNORMAL LOW (ref 22–32)
Calcium: 7.4 mg/dL — ABNORMAL LOW (ref 8.9–10.3)
Chloride: 102 mmol/L (ref 98–111)
Creatinine, Ser: 0.9 mg/dL (ref 0.44–1.00)
GFR, Estimated: >60 mL/min (ref >60)
Glucose, Bld: 266 mg/dL — ABNORMAL HIGH (ref 70–99)
Potassium: 3.2 mmol/L — ABNORMAL LOW (ref 3.5–5.1)
Sodium: 135 mmol/L (ref 135–145)
Total Bilirubin: 0.7 mg/dL (ref 0.3–1.2)
Total Protein: 4.6 g/dL — ABNORMAL LOW (ref 6.5–8.1)

## 2022-05-31 LAB — I-STAT CHEM 8, ED
BUN: 8 mg/dL (ref 6–20)
Calcium, Ion: 1.06 mmol/L — ABNORMAL LOW (ref 1.15–1.40)
Chloride: 97 mmol/L — ABNORMAL LOW (ref 98–111)
Creatinine, Ser: 0.6 mg/dL (ref 0.44–1.00)
Glucose, Bld: 217 mg/dL — ABNORMAL HIGH (ref 70–99)
HCT: 32 % — ABNORMAL LOW (ref 36.0–46.0)
Hemoglobin: 10.9 g/dL — ABNORMAL LOW (ref 12.0–15.0)
Potassium: 3.9 mmol/L (ref 3.5–5.1)
Sodium: 134 mmol/L — ABNORMAL LOW (ref 135–145)
TCO2: 23 mmol/L (ref 22–32)

## 2022-05-31 LAB — CBC
HCT: 39.7 % (ref 36.0–46.0)
Hemoglobin: 12.9 g/dL (ref 12.0–15.0)
MCH: 29.8 pg (ref 26.0–34.0)
MCHC: 32.5 g/dL (ref 30.0–36.0)
MCV: 91.7 fL (ref 80.0–100.0)
Platelets: 170 10*3/uL (ref 150–400)
RBC: 4.33 MIL/uL (ref 3.87–5.11)
RDW: 13.7 % (ref 11.5–15.5)
WBC: 10.2 10*3/uL (ref 4.0–10.5)
nRBC: 0.2 % (ref 0.0–0.2)

## 2022-05-31 LAB — BASIC METABOLIC PANEL
Anion gap: 3 — ABNORMAL LOW (ref 5–15)
Anion gap: 14 (ref 5–15)
BUN: 5 mg/dL — ABNORMAL LOW (ref 6–20)
BUN: 13 mg/dL (ref 6–20)
CO2: 12 mmol/L — ABNORMAL LOW (ref 22–32)
CO2: 18 mmol/L — ABNORMAL LOW (ref 22–32)
Calcium: <4 mg/dL — CL (ref 8.9–10.3)
Calcium: 6.7 mg/dL — ABNORMAL LOW (ref 8.9–10.3)
Chloride: 126 mmol/L — ABNORMAL HIGH (ref 98–111)
Chloride: 93 mmol/L — ABNORMAL LOW (ref 98–111)
Creatinine, Ser: <0.3 mg/dL — ABNORMAL LOW (ref 0.44–1.00)
Creatinine, Ser: 0.98 mg/dL (ref 0.44–1.00)
GFR, Estimated: >60 mL/min (ref >60)
Glucose, Bld: 118 mg/dL — ABNORMAL HIGH (ref 70–99)
Glucose, Bld: 659 mg/dL (ref 70–99)
Potassium: <2 mmol/L — CL (ref 3.5–5.1)
Potassium: 2.7 mmol/L — CL (ref 3.5–5.1)
Sodium: 141 mmol/L (ref 135–145)
Sodium: 125 mmol/L — ABNORMAL LOW (ref 135–145)

## 2022-05-31 LAB — PROTIME-INR
INR: 1.4 — ABNORMAL HIGH (ref 0.8–1.2)
Prothrombin Time: 17.5 seconds — ABNORMAL HIGH (ref 11.4–15.2)

## 2022-05-31 LAB — MRSA NEXT GEN BY PCR, NASAL: MRSA by PCR Next Gen: NOT DETECTED

## 2022-05-31 LAB — SARS CORONAVIRUS 2 BY RT PCR: SARS Coronavirus 2 by RT PCR: NEGATIVE

## 2022-05-31 LAB — BRAIN NATRIURETIC PEPTIDE: B Natriuretic Peptide: 26.8 pg/mL (ref 0.0–100.0)

## 2022-05-31 LAB — GLUCOSE, CAPILLARY: Glucose-Capillary: 399 mg/dL — ABNORMAL HIGH (ref 70–99)

## 2022-05-31 LAB — CBG MONITORING, ED: Glucose-Capillary: 118 mg/dL — ABNORMAL HIGH (ref 70–99)

## 2022-05-31 LAB — PREGNANCY, URINE: Preg Test, Ur: NEGATIVE

## 2022-05-31 LAB — TROPONIN I (HIGH SENSITIVITY): Troponin I (High Sensitivity): 2130 ng/L (ref <18)

## 2022-05-31 MED ORDER — ONDANSETRON 4 MG PO TBDP
4.0000 mg | ORAL_TABLET | Freq: Once | ORAL | Status: AC
  Administered 2022-05-31: 4 mg via ORAL
  Filled 2022-05-31: qty 1

## 2022-05-31 MED ORDER — POLYETHYLENE GLYCOL 3350 17 G PO PACK
17.0000 g | PACK | Freq: Every day | ORAL | Status: DC
  Administered 2022-05-31: 17 g
  Filled 2022-05-31: qty 1

## 2022-05-31 MED ORDER — HYDROGEN PEROXIDE 3 % EX SOLN
CUTANEOUS | Status: AC
  Filled 2022-05-31: qty 473

## 2022-05-31 MED ORDER — DOCUSATE SODIUM 50 MG/5ML PO LIQD
100.0000 mg | Freq: Two times a day (BID) | ORAL | Status: DC
  Administered 2022-05-31: 100 mg
  Filled 2022-05-31: qty 10

## 2022-05-31 MED ORDER — ACETAMINOPHEN 160 MG/5ML PO SOLN
650.0000 mg | ORAL | Status: DC
  Administered 2022-05-31 – 2022-06-01 (×3): 650 mg
  Filled 2022-05-31 (×3): qty 20.3

## 2022-05-31 MED ORDER — ACETAMINOPHEN 325 MG PO TABS: 650.0000 mg | ORAL_TABLET | ORAL | Status: DC | PRN

## 2022-05-31 MED ORDER — POLYETHYLENE GLYCOL 3350 17 G PO PACK: 17.0000 g | PACK | Freq: Every day | ORAL | Status: DC | PRN

## 2022-05-31 MED ORDER — PHENYLEPHRINE HCL-NACL 20-0.9 MG/250ML-% IV SOLN
0.0000 ug/min | INTRAVENOUS | Status: DC
  Administered 2022-05-31: 20 ug/min via INTRAVENOUS
  Filled 2022-05-31: qty 250

## 2022-05-31 MED ORDER — FENTANYL CITRATE PF 50 MCG/ML IJ SOSY: 50.0000 ug | PREFILLED_SYRINGE | INTRAMUSCULAR | Status: DC | PRN

## 2022-05-31 MED ORDER — EPINEPHRINE HCL 5 MG/250ML IV SOLN IN NS
0.5000 ug/min | INTRAVENOUS | Status: DC
  Administered 2022-05-31: 17 ug/min via INTRAVENOUS
  Filled 2022-05-31: qty 500

## 2022-05-31 MED ORDER — HYDROCORTISONE SOD SUC (PF) 100 MG IJ SOLR
50.0000 mg | Freq: Four times a day (QID) | INTRAMUSCULAR | Status: DC
  Administered 2022-05-31 – 2022-06-01 (×3): 50 mg via INTRAVENOUS
  Filled 2022-05-31 (×2): qty 2

## 2022-05-31 MED ORDER — DEXAMETHASONE SODIUM PHOSPHATE 10 MG/ML IJ SOLN
INTRAMUSCULAR | Status: AC
  Filled 2022-05-31: qty 1

## 2022-05-31 MED ORDER — VASOPRESSIN 20 UNITS/100 ML INFUSION FOR SHOCK
0.0000 [IU]/min | INTRAVENOUS | Status: DC
  Administered 2022-05-31 (×2): 0.03 [IU]/min via INTRAVENOUS
  Filled 2022-05-31 (×2): qty 100

## 2022-05-31 MED ORDER — ONDANSETRON HCL 4 MG PO TABS: 4.0000 mg | ORAL_TABLET | Freq: Three times a day (TID) | ORAL | Status: DC | PRN

## 2022-05-31 MED ORDER — PROPOFOL 1000 MG/100ML IV EMUL: 0.0000 ug/kg/min | INTRAVENOUS | Status: DC

## 2022-05-31 MED ORDER — ACETAMINOPHEN 325 MG PO TABS
650.0000 mg | ORAL_TABLET | ORAL | Status: DC | PRN
  Administered 2022-05-31: 650 mg via ORAL
  Filled 2022-05-31: qty 2

## 2022-05-31 MED ORDER — DOCUSATE SODIUM 50 MG/5ML PO LIQD
100.0000 mg | Freq: Two times a day (BID) | ORAL | Status: DC | PRN
  Filled 2022-05-31: qty 10

## 2022-05-31 MED ORDER — MAGNESIUM SULFATE 2 GM/50ML IV SOLN
2.0000 g | Freq: Once | INTRAVENOUS | Status: AC
  Administered 2022-05-31: 2 g via INTRAVENOUS
  Filled 2022-05-31: qty 50

## 2022-05-31 MED ORDER — LEVOFLOXACIN 500 MG PO TABS
750.0000 mg | ORAL_TABLET | Freq: Every day | ORAL | Status: DC
  Filled 2022-05-31: qty 1

## 2022-05-31 MED ORDER — SODIUM CHLORIDE 0.9 % IV SOLN
1.0000 g | Freq: Three times a day (TID) | INTRAVENOUS | Status: DC
  Administered 2022-06-01 (×2): 1 g via INTRAVENOUS
  Filled 2022-05-31 (×3): qty 20

## 2022-05-31 MED ORDER — NOREPINEPHRINE 4 MG/250ML-% IV SOLN
2.0000 ug/min | INTRAVENOUS | Status: DC
  Administered 2022-05-31: 2 ug/min via INTRAVENOUS
  Administered 2022-05-31: 10 ug/min via INTRAVENOUS
  Filled 2022-05-31: qty 250

## 2022-05-31 MED ORDER — ACETAMINOPHEN 650 MG RE SUPP: 650.0000 mg | RECTAL | Status: DC | PRN

## 2022-05-31 MED ORDER — BUSPIRONE HCL 10 MG PO TABS: 30.0000 mg | ORAL_TABLET | Freq: Three times a day (TID) | ORAL | Status: DC | PRN

## 2022-05-31 MED ORDER — FAMOTIDINE 20 MG PO TABS
20.0000 mg | ORAL_TABLET | Freq: Two times a day (BID) | ORAL | Status: DC
  Administered 2022-05-31 – 2022-06-01 (×2): 20 mg
  Filled 2022-05-31 (×2): qty 1

## 2022-05-31 MED ORDER — ACETAMINOPHEN 650 MG RE SUPP: 650.0000 mg | RECTAL | Status: DC

## 2022-05-31 MED ORDER — INSULIN ASPART 100 UNIT/ML IJ SOLN
0.0000 [IU] | INTRAMUSCULAR | Status: DC | PRN
  Administered 2022-05-31 – 2022-06-01 (×2): 15 [IU] via SUBCUTANEOUS

## 2022-05-31 MED ORDER — POTASSIUM CHLORIDE CRYS ER 20 MEQ PO TBCR
20.0000 meq | EXTENDED_RELEASE_TABLET | Freq: Two times a day (BID) | ORAL | Status: DC
  Administered 2022-05-31: 20 meq via ORAL
  Filled 2022-05-31 (×2): qty 1

## 2022-05-31 MED ORDER — POTASSIUM CHLORIDE CRYS ER 20 MEQ PO TBCR: 20.0000 meq | EXTENDED_RELEASE_TABLET | Freq: Two times a day (BID) | ORAL | 0 refills | Status: AC

## 2022-05-31 MED ORDER — PANTOPRAZOLE SODIUM 40 MG IV SOLR
40.0000 mg | Freq: Two times a day (BID) | INTRAVENOUS | Status: DC
  Administered 2022-05-31 – 2022-06-01 (×2): 40 mg via INTRAVENOUS
  Filled 2022-05-31 (×2): qty 10

## 2022-05-31 MED ORDER — SODIUM BICARBONATE 8.4 % IV SOLN
INTRAVENOUS | Status: DC
  Filled 2022-05-31 (×2): qty 150

## 2022-05-31 MED ORDER — POTASSIUM CHLORIDE 20 MEQ PO PACK
60.0000 meq | PACK | Freq: Two times a day (BID) | ORAL | Status: DC
  Administered 2022-05-31 – 2022-06-01 (×2): 60 meq
  Filled 2022-05-31 (×2): qty 3

## 2022-05-31 MED ORDER — SODIUM CHLORIDE 0.9 % IV SOLN
10.0000 mL/h | Freq: Once | INTRAVENOUS | Status: AC
  Administered 2022-05-31: 10 mL/h via INTRAVENOUS

## 2022-05-31 MED ORDER — MAGNESIUM SULFATE 2 GM/50ML IV SOLN: 2.0000 g | Freq: Once | INTRAVENOUS | Status: DC | PRN

## 2022-05-31 MED ORDER — POTASSIUM CHLORIDE 20 MEQ PO PACK
20.0000 meq | PACK | Freq: Two times a day (BID) | ORAL | Status: DC
  Administered 2022-05-31 – 2022-06-01 (×2): 20 meq
  Filled 2022-05-31 (×2): qty 1

## 2022-05-31 MED ORDER — CALCIUM GLUCONATE-NACL 1-0.675 GM/50ML-% IV SOLN
1.0000 g | Freq: Once | INTRAVENOUS | Status: AC
  Administered 2022-05-31: 1000 mg via INTRAVENOUS
  Filled 2022-05-31: qty 50

## 2022-05-31 MED ORDER — CHLORHEXIDINE GLUCONATE CLOTH 2 % EX PADS: 6.0000 | MEDICATED_PAD | Freq: Every day | CUTANEOUS | Status: DC

## 2022-05-31 MED ORDER — SODIUM CHLORIDE 0.9 % IV SOLN
250.0000 mL | INTRAVENOUS | Status: DC
  Administered 2022-05-31: 250 mL via INTRAVENOUS

## 2022-05-31 MED ORDER — SODIUM CHLORIDE 0.9 % IV SOLN: 250.0000 mL | INTRAVENOUS | Status: DC

## 2022-05-31 MED ORDER — ACETAMINOPHEN 325 MG PO TABS: 650.0000 mg | ORAL_TABLET | ORAL | Status: DC

## 2022-05-31 MED ORDER — THIAMINE MONONITRATE 100 MG PO TABS
100.0000 mg | ORAL_TABLET | Freq: Every day | ORAL | Status: DC
  Administered 2022-05-31: 100 mg via ORAL
  Filled 2022-05-31 (×2): qty 1

## 2022-05-31 MED ORDER — ONDANSETRON HCL 4 MG/2ML IJ SOLN: 4.0000 mg | Freq: Four times a day (QID) | INTRAMUSCULAR | Status: DC | PRN

## 2022-05-31 MED ORDER — NOREPINEPHRINE 4 MG/250ML-% IV SOLN
0.0000 ug/min | INTRAVENOUS | Status: DC
  Administered 2022-05-31: 20 ug/min via INTRAVENOUS
  Administered 2022-06-01: 2 ug/min via INTRAVENOUS
  Filled 2022-05-31: qty 250

## 2022-05-31 MED ORDER — KETOROLAC TROMETHAMINE 30 MG/ML IJ SOLN
30.0000 mg | Freq: Once | INTRAMUSCULAR | Status: AC
  Administered 2022-05-31: 30 mg via INTRAMUSCULAR
  Filled 2022-05-31: qty 1

## 2022-05-31 MED ORDER — EPINEPHRINE HCL 5 MG/250ML IV SOLN IN NS
INTRAVENOUS | Status: AC
  Administered 2022-05-31: 0.5 ug/min via INTRAVENOUS
  Filled 2022-05-31: qty 250

## 2022-05-31 MED ORDER — ETOMIDATE 2 MG/ML IV SOLN
INTRAVENOUS | Status: AC
  Filled 2022-05-31: qty 10

## 2022-05-31 MED ORDER — ACETAMINOPHEN 160 MG/5ML PO SOLN: 650.0000 mg | ORAL | Status: DC | PRN

## 2022-05-31 MED ORDER — ROCURONIUM BROMIDE 10 MG/ML (PF) SYRINGE
PREFILLED_SYRINGE | INTRAVENOUS | Status: AC
  Filled 2022-05-31: qty 10

## 2022-05-31 MED FILL — Medication: Qty: 1 | Status: AC

## 2022-05-31 NOTE — ED Notes (Signed)
At 1147 am this nurse contacted EDP [place] to gain permission to straight cath patient for UDS and UA. EDP agreed. Straight cath completed using sterile technique and with two staff at bedside. Patient was able to follow commands with staff repetition but still remains weak.   At 1155 this nurse notified EDP Regenia Skeeter of family concerns of member being dehydrated due to not eating or drinking at home much for the past 1.5 weeks (which is also the timeframe the reported weakness started).

## 2022-05-31 NOTE — ED Notes (Signed)
Patient ambulated to room 28 in TCU with sitter

## 2022-05-31 NOTE — ED Notes (Signed)
Upon trying to discharge patient, patient appeared very lethargic and expended no energy attempting to dress self for discharge. Patient noted to have urine on clothes. NTs cleaned patient thoroughly and changed clothes of patient. Patient still not participating in dressing. While rolling patient from side to side, patient urinated again in the bed. This Probation officer contacted EDP [place] and notified of patient's current condition. CBG ordered and completed. While patient is able to answer yes/no questions, patient appears to be unable to stand independently or transfer. Discharge retracted at this time and willl await psych NP to reassess patient. This writer notified daughter Chinelo Benn [phone] that patient will not be discharged at this time.

## 2022-05-31 NOTE — Progress Notes (Signed)
[place] Progress Note Patient Name: Amber White DOB: 12/17/1975 MRN: [id number]   Date of Service  05/31/2022  HPI/Events of Note  Pt with labile BP, tachycardic to the 130s-140s Currently hypertensive on combination of levophed, epinephrine and vasopressin.  Attempts to decrease levophed results in SBP coming down to the 40s.   eICU Interventions  Will start phenylephrine, then plan to wean down epinephrine first (decreased titration parameters as well), then norepinephrine.  Will continue to monitor HR closely.         Fredrik Mogel M DELA CRUZ 05/31/2022, 8:48 PM

## 2022-05-31 NOTE — Discharge Instructions (Signed)
If you develop continued, recurrent, or worsening headache, fever, neck stiffness, vomiting, blurry or double vision, weakness or numbness in your arms or legs, trouble speaking, or any other new/concerning symptoms then return to the ER for evaluation.  

## 2022-05-31 NOTE — Progress Notes (Signed)
eLink Physician-Brief Progress Note Patient Name: Amber White DOB: 02/21/1976 MRN: [id number]   Date of Service  05/31/2022  HPI/Events of Note  Noted BMP.    eICU Interventions  Will give potassium repletion 60 meq (in addition to due 66mq) per OGT.   Will place on moderate dose sliding scale insulin q4h. IF glucose persistently elevated, may need to initiate an insulin drip  for glucose control (once K partially corrected)         [name] 05/31/2022, 9:59 PM

## 2022-05-31 NOTE — Consult Note (Cosign Needed Addendum)
[facility] Psych ED Discharge  05/31/2022 2:55 PM Amber White  MRN:  [id number]  Method of visit?: Face to Face   Principal Problem: Depression Discharge Diagnoses: Principal Problem:   Depression   Subjective: Amber White 46 year old African-American female was seen and evaluated by this provider.  Patient initially presented to [facility] behavioral health for mental health evaluation.  Patient was psychiatrically cleared and sent to [facility] emergency department for medical clearance.  Currently she is denying suicidal or homicidal ideations.  Denied auditory visual hallucinations. Denied previous inpatient admission. Chart reviewed UDS . She reports having a "headache" during this assessment.  NP make MD aware of reporting symptoms.  Denied any safety concerns with returning home.  Patient remains psychiatrically cleared.  11:30-MD requested for patient to be reevaluated by psychiatry due to patient's presentation at discharge.  13:45 patient was reassessed-  Amber White presents tearful as she continues to deny suicidal or homicidal ideations.  Denies auditory or visual hallucinations.  She reports she resides at home with her boyfriend.  She denied any safety concerns with returning home.  Patient was questioned related to inability to ambulate a. "I can walk I am fine."  Patient's responses were short and abrupt.  However remained fully engaged during this assessment.   -Discussed overnight observation and psychiatry to continue to follow due to documented behaviors.  Support,  encouragement and reassurance was provided.   Per admission assessment note: "  Amber White is a 46 y.o. female with no documented past psychiatric history who presents voluntarily to [facility] as a walk-in, accompanied by her mother Bondurant Blas, [phone]), for concern of her mental health. The majority of the patient's history was provided by the mother.  Per mother, patient was recently  discharged from the hospital on Wednesday and ever since her discharge, patient has not been eating, sleeping, or drinking.  Patient's mother states that the patient mostly sleeps and does not do anything. She states that patient won't even shower.  She also reports that the patient is not interested in interacting with her daughters.   Patient's mother states that this all started last Thursday after the patient was recently treated for pneumonia after being assessed at [facility] ED.  Patient was discharged from [facility] last Thursday and placed on antibiotics.  Patient's mother states that shortly after the patient was discharged, the patient began "Talking out of her head" and experiencing hallucinations.  Patient was taken back to [facility] ED where she was transferred over to [facility] ED and hospitalized.  During the patient's hospitalization, spinal fluid was extracted and she was found to have elevated prolactin.  Patient's mother states that the providers in charge of the course of her care were concerned that the prolactin would have a negative impact on her brain.  During her hospitalization, patient's mother states that the patient was given steroids to combat her elevated prolactin level.  She reports that the patient was discharged this past Wednesday and ever since discharge, patient has not been drinking.  She reports that the only time the patient will drink is when she has a cup of water when taking her medications.  Patient's mother notes that the patient has also not been talking as much.  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    Past Surgical History:  Procedure Laterality Date   APPLICATION OF A-CELL OF CHEST/ABDOMEN Left 04/01/2022   Procedure: ACELLULAR DERMIS TO LEFT CHEST;  Surgeon: Irene Limbo, MD;  Location: [place];  Service: Plastics;  Laterality: Left;   CHOLECYSTECTOMY     INCISION AND DRAINAGE OF WOUND Right 04/04/2022   Procedure: IRRIGATION AND DEBRIDEMENT OF CHEST , POSSIBLE REMOVAL OF TISSUE EXPANDER;  Surgeon: Irene Limbo, MD;  Location: [place];  Service: Plastics;  Laterality: Right;   LATISSIMUS FLAP TO BREAST Right 04/01/2022   Procedure: RIGHT LATISSIMUS DORSI FLAP TO RIGHT CHEST;  Surgeon: Irene Limbo, MD;  Location: [place];  Service: Plastics;  Laterality: Right;   MODIFIED MASTECTOMY Right 06/27/2021   Procedure: RIGHT MODIFIED RADICAL MASTECTOMY;  Surgeon: Donnie Mesa, MD;  Location: [place];  Service: General;  Laterality: Right;   PORT-A-CATH REMOVAL Right 11/05/2021   Procedure: REMOVAL PORT-A-CATH;  Surgeon: Donnie Mesa, MD;  Location: [place];  Service: General;  Laterality: Right;   PORTACATH PLACEMENT N/A 11/21/2020   Procedure: INSERTION PORT-A-CATH;  Surgeon: Donnie Mesa, MD;  Location: [place];  Service: General;  Laterality: N/A;   TISSUE EXPANDER PLACEMENT Bilateral 04/01/2022   Procedure: BILATERAL BREAST RECONSTRUCTION WITH PLACEMENT OF TISSUE EXPANDERS;  Surgeon: Irene Limbo, MD;  Location: [place];  Service: Plastics;  Laterality: Bilateral;   TOTAL MASTECTOMY Left 06/27/2021   Procedure: LEFT TOTAL MASTECTOMY;  Surgeon: Donnie Mesa, MD;  Location: [place];  Service: General;  Laterality: Left;   Family History:  Family History  Problem Relation Age of Onset   Breast cancer Mother     Diabetes Maternal Uncle    Diabetes Maternal Grandmother    Heart disease Maternal Grandmother    Family Psychiatric  History:  Social History:  Social History   Substance and Sexual Activity  Alcohol Use Yes   Comment: rare (maybe once every 3-4 months)     Social History   Substance and Sexual Activity  Drug Use No    Social History   Socioeconomic History   Marital status: Single    Spouse name: Not on file   Number of children: 4   Years of education: Not on file   Highest education level: Not on file  Occupational History   Not on file  Tobacco Use   Smoking status: Never   Smokeless tobacco: Never  Vaping Use   Vaping Use: Never used  Substance and Sexual Activity   Alcohol use: Yes    Comment: rare (maybe once every 3-4 months)   Drug use: No   Sexual activity: Yes    Birth control/protection: None  Other Topics Concern   Not on file  Social History Narrative   Not on file   Social Determinants of Health   Financial Resource Strain: Not on file  Food Insecurity: No Food Insecurity (05/25/2022)   Hunger Vital Sign    Worried About Running Out of Food in the Last Year: Never true  Ran Out of Food in the Last Year: Never true  Transportation Needs: No Transportation Needs (05/26/2022)   PRAPARE - Hydrologist (Medical): No    Lack of Transportation (Non-Medical): No  Physical Activity: Not on file  Stress: Not on file  Social Connections: Not on file    Tobacco Cessation:  N/A, patient does not currently use tobacco products  Current Medications: Current Facility-Administered Medications  Medication Dose Route Frequency Provider Last Rate Last Admin   acetaminophen (TYLENOL) tablet 650 mg  650 mg Oral Q4H PRN Sherwood Gambler, MD   650 mg at 05/31/22 0953   etomidate (AMIDATE) 2 MG/ML injection            levofloxacin (LEVAQUIN) tablet 750 mg  750 mg Oral Daily Sherwood Gambler, MD       ondansetron [facility]) tablet 4  mg  4 mg Oral Q8H PRN Sherwood Gambler, MD       potassium chloride SA (KLOR-CON M) CR tablet 20 mEq  20 mEq Oral BID Sherwood Gambler, MD   20 mEq at 05/31/22 0959   rocuronium bromide 100 MG/10ML SOSY            thiamine (VITAMIN B1) tablet 100 mg  100 mg Oral Daily Sherwood Gambler, MD   100 mg at 05/31/22 8250   Current Outpatient Medications  Medication Sig Dispense Refill   bisoprolol (ZEBETA) 10 MG tablet TAKE 1 TABLET BY MOUTH EVERY DAY (Patient taking differently: Take 10 mg by mouth daily.) 90 tablet 1   doxycycline (ADOXA) 100 MG tablet Take 100 mg by mouth daily. Continuous course.     levofloxacin (LEVAQUIN) 750 MG tablet Take 1 tablet (750 mg total) by mouth daily for 7 days. 7 tablet 0   moxifloxacin (AVELOX) 400 MG tablet Take 400 mg by mouth daily at 6 (six) AM. 7 day course.     ondansetron (ZOFRAN) 4 MG tablet Take 1 tablet (4 mg total) by mouth every 6 (six) hours. 12 tablet 0   potassium chloride SA (KLOR-CON M) 20 MEQ tablet Take 1 tablet (20 mEq total) by mouth 2 (two) times daily for 3 days. 6 tablet 0   hydrocortisone (CORTEF) 5 MG tablet TAKE 1 TABLET BY MOUTH IN THE MORNING AND 1/2 TABLET DAILY BEFORE SUPPER. (Patient not taking: Reported on 05/22/2022) 90 tablet 3   thiamine (VITAMIN B1) 100 MG tablet Take 1 tablet (100 mg total) by mouth daily. 100 tablet 0   Facility-Administered Medications Ordered in Other Encounters  Medication Dose Route Frequency Provider Last Rate Last Admin   filgrastim-aafi (NIVESTYM) 480 MCG/0.8ML injection            PTA Medications: (Not in a hospital admission)   Musculoskeletal: Strength & Muscle Tone: within normal limits Gait & Station:  resting in bed Patient leans: N/A  Psychiatric Specialty Exam:  Presentation  General Appearance:  Appropriate for Environment; Casual; Other (comment) (Patient is ill-appearing)  Eye Contact: Good  Speech: Clear and Coherent; Other (comment) (Minimal, most of the history provided by the  mother)  Speech Volume: Normal  Handedness: Right   Mood and Affect  Mood: Euthymic  Affect: Congruent   Thought Process  Thought Processes: Coherent  Descriptions of Associations:Intact  Orientation:Full (Time, Place and Person)  Thought Content:Logical  History of Schizophrenia/Schizoaffective disorder:No data recorded Duration of Psychotic Symptoms:No data recorded Hallucinations:Hallucinations: None  Ideas of Reference:None  Suicidal Thoughts:Suicidal Thoughts: No  Homicidal Thoughts:Homicidal Thoughts: No   Sensorium  Memory: Immediate Good; Recent Good; Remote Fair  Judgment: Good  Insight: Fair   Executive Functions  Concentration: Good  Attention Span: Good  Recall: [place] of Knowledge: Good  Language: Good   Psychomotor Activity  Psychomotor Activity: Psychomotor Activity: Decreased   Assets  Assets: Communication Skills; Desire for Improvement; Housing; Social Support; Vocational/Educational   Sleep  Sleep: Sleep: Fair (Patient has been sleeping too much)    Physical Exam: Physical Exam Vitals and nursing note reviewed.  Neurological:     Mental Status: She is alert.  Psychiatric:        Mood and Affect: Mood normal.        Behavior: Behavior normal.        Thought Content: Thought content normal.    Review of Systems  Psychiatric/Behavioral:  Negative for depression. The patient is nervous/anxious.   All other systems reviewed and are negative.  Blood pressure (!) 192/92, pulse 70, temperature 97.7 F (36.5 C), temperature source Oral, resp. rate 16, height '5\' 4"'$  (1.626 m), weight 98 kg, last menstrual period 10/26/2020, SpO2 97 %. Body mass index is 37.09 kg/m.   Demographic Factors:  Living with another person, especially a relative and Positive social support  Loss Factors: NA  Historical Factors: Impulsivity  Risk Reduction Factors:   Positive therapeutic relationship and Positive coping  skills or problem solving skills  Continued Clinical Symptoms:  Depression:   Anhedonia  Cognitive Features That Contribute To Risk:  Closed-mindedness    Suicide Risk:  Minimal: No identifiable suicidal ideation.  Patients presenting with no risk factors but with morbid ruminations; may be classified as minimal risk based on the severity of the depressive symptoms   Follow-up Information     Hague, Rosalyn Charters, MD.   Specialty: Internal Medicine Contact information: [address] [place] [address] [phone]         [facility].   Specialty: Urgent Care Why: As needed Contact information: [place] DEPT.   Specialty: Emergency Medicine Why: If symptoms worsen Contact information: [place] [id number] [place] [phone]                Plan Of Care/Follow-up recommendations:  Activity:  as tolerated Diet:  heart healthy  Disposition: Take all of you medications as prescribed by your mental healthcare provider.  Report any adverse effects and reactions from your medications to your outpatient provider promptly.  Do not engage in alcohol and or illegal drug use while on prescription medicines.  Keep all scheduled appointments. This is to ensure that you are getting refills on time and to avoid any interruption in your medication.  If you are unable to keep an appointment call to reschedule.  Be sure to follow up with resources and follow ups given.  In the event of worsening symptoms call the crisis hotline, 911, and or go to the nearest emergency department for appropriate evaluation and treatment of symptoms. Follow-up with your primary care provider for your medical issues, concerns and or health care needs.      Derrill Center, NP 05/31/2022, 2:55 PM

## 2022-05-31 NOTE — Progress Notes (Signed)
RT sent ABG to lab. Lab called by RT

## 2022-05-31 NOTE — Progress Notes (Signed)
Paged by ED to come for support to family of patient who coded twice. Encountered a very large family presence. There are no needs expressed for the chaplain. Providing supportive care along side of staff in ICU.

## 2022-05-31 NOTE — Consult Note (Addendum)
Neurology Consultation Reason for Consult: Hypodensities on head CT Requesting Physician: Sherrilyn Rist  CC: Somnolence, depression  History is obtained from: Chart review  HPI: Amber White is a 46 y.o. 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 with pulse dose steroids 11/10 - 11/14, with improvement but not full resolution of her confusion and motor apraxia  MRI could not be obtained due to noncompatible breast expanders in place Autoimmune encephalitis panel still pending Thyroid antibodies were negative Thiamine resulted mildly low at 54.9, she was supplemented IV on admission Long-term EEG monitoring was notable only for continuous generalized slowing (11/10 0453 through 11/11 1145) She was additionally treated with 5 days of meropenem as 11/11 it was noted that 1 out of 2 of her blood cultures from 11/8 grew Moraxella   She was discharged on 11/15.  Husband at bedside reports that she was initially doing better but then worsened again and there was some concern for drug overdose due to history of the suicidal ideation or attempt in the past.  She presented on 11/17 as she was "talking out of her head" and experiencing hallucinations, doing nothing but sleeping and not interacting with her children.  Specifically a psychiatric note mentions "She [patient's mother] recalls that the patient talked about dying and that if she was dying, she would rather die at home." She was sent to [facility] for medical clearance, as there is no clear concern for a primary  psych issue on psychiatry evaluation, and complained of some migraines during her stay and had low potassium which was repleted.  She also had an episode around 11:39 AM when she fell seemed all of a sudden weak, had urinated on herself and was tired but moving all extremities slowly.  They were planning continued observation overnight but she was subsequently found pulseless and apneic at 2:30 PM.  Last documented interaction was at 1:35 PM with pharmacy technician where patient was not engaging well in conversation.  After 18-25 minutes of CPR ROSC was obtained (PEA rhythm, no shocks), patient was intubated, but patient lost pulses again requiring a second round of CPR (1-2 rounds of CPR).  Hemoglobin resulted at 5.3 and she received 2 units of emergency release packed red blood cells, she is unresponsive and requiring 3 pressors; posttransfusion hemoglobin 12.9 and then 15.3 (9.6 at discharge).  She is also on stress dose steroids and has a transaminitis  ROS: Unable to obtain due to altered mental status.   Past Medical History:  Diagnosis Date   Adrenal insufficiency ([place]) 05/2021   in setting of chemotherapy   Anemia    breast ca dx'd 10/2020   right   Cardiomyopathy [facility]) 05/2021   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   Family history of breast cancer    History of radiation therapy    right breast  08/12/2021-09/20/2021  Dr Gery Pray   Hypertension    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   Past Surgical History:  Procedure Laterality Date   APPLICATION OF A-CELL OF CHEST/ABDOMEN Left 04/01/2022   Procedure: ACELLULAR DERMIS TO LEFT CHEST;  Surgeon: Irene Limbo, MD;  Location: [place];  Service: Plastics;  Laterality: Left;  CHOLECYSTECTOMY     INCISION AND DRAINAGE OF WOUND Right 04/04/2022   Procedure: IRRIGATION AND DEBRIDEMENT OF CHEST , POSSIBLE REMOVAL OF TISSUE EXPANDER;  Surgeon: Irene Limbo, MD;  Location: [place];  Service: Plastics;  Laterality: Right;    LATISSIMUS FLAP TO BREAST Right 04/01/2022   Procedure: RIGHT LATISSIMUS DORSI FLAP TO RIGHT CHEST;  Surgeon: Irene Limbo, MD;  Location: [place];  Service: Plastics;  Laterality: Right;   MODIFIED MASTECTOMY Right 06/27/2021   Procedure: RIGHT MODIFIED RADICAL MASTECTOMY;  Surgeon: Donnie Mesa, MD;  Location: [place];  Service: General;  Laterality: Right;   PORT-A-CATH REMOVAL Right 11/05/2021   Procedure: REMOVAL PORT-A-CATH;  Surgeon: Donnie Mesa, MD;  Location: [place];  Service: General;  Laterality: Right;   PORTACATH PLACEMENT N/A 11/21/2020   Procedure: INSERTION PORT-A-CATH;  Surgeon: Donnie Mesa, MD;  Location: [place];  Service: General;  Laterality: N/A;   TISSUE EXPANDER PLACEMENT Bilateral 04/01/2022   Procedure: BILATERAL BREAST RECONSTRUCTION WITH PLACEMENT OF TISSUE EXPANDERS;  Surgeon: Irene Limbo, MD;  Location: [place];  Service: Plastics;  Laterality: Bilateral;   TOTAL MASTECTOMY Left 06/27/2021   Procedure: LEFT TOTAL MASTECTOMY;  Surgeon: Donnie Mesa, MD;  Location: [place];  Service: General;  Laterality: Left;   Current Outpatient Medications  Medication Instructions   bisoprolol (ZEBETA) 10 MG tablet TAKE 1 TABLET BY MOUTH EVERY DAY   doxycycline (ADOXA) 100 mg, Oral, Daily, Continuous course.   hydrocortisone (CORTEF) 5 MG tablet TAKE 1 TABLET BY MOUTH IN THE MORNING AND 1/2 TABLET DAILY BEFORE SUPPER.   levofloxacin (LEVAQUIN) 750 mg, Oral, Daily   moxifloxacin (AVELOX) 400 mg, Oral, Daily, 7 day course.    ondansetron (ZOFRAN) 4 mg, Oral, Every 6 hours   potassium chloride SA (KLOR-CON M) 20 MEQ tablet 20 mEq, Oral, 2 times daily   thiamine (VITAMIN B1) 100 mg, Oral, Daily     Family History  Problem Relation Age of Onset   Breast cancer Mother    Diabetes Maternal Uncle    Diabetes Maternal Grandmother    Heart disease Maternal Grandmother     Social History:  reports that she has never smoked. She  has never used smokeless tobacco. She reports current alcohol use. She reports that she does not use drugs.   Exam: Current vital signs: BP (!) 161/135   Pulse (!) 146   Temp (!) 96.6 F (35.9 C)   Resp 12   Ht 5' 4"$  (1.626 m)   Wt 98 kg   LMP 10/26/2020 Comment: UPT neg DOS  SpO2 94%   BMI 37.09 kg/m  Vital signs in last 24 hours: Temp:  [93 F (33.9 C)-98.2 F (36.8 C)] 96.6 F (35.9 C) (11/19 0100) Pulse Rate:  [70-155] 146 (11/19 0100) Resp:  [0-35] 12 (11/19 0100) BP: (59-192)/(36-137) 161/135 (11/19 0000) SpO2:  [92 %-100 %] 94 % (11/19 0100) Arterial Line BP: (64-160)/(45-119) 123/82 (11/19 0100) FiO2 (%):  [75 %-100 %] 75 % (11/19 0041)  Physical Exam  Constitutional: Appears ill Psych: Not interactive Eyes: Scleral edema is mild HENT: ET tube in place MSK: no joint deformities.  Cardiovascular: Normal rate and regular rhythm.  Respiratory: Breathing comfortably on the ventilator GI: Soft.  No distension.  Skin: Warm dry and intact visible skin.     Neuro: Mental Status: Eyes slightly open, no change to voice or noxious stimulation Does not follow any commands Cranial Nerves II: No blink to threat. Pupils are mildly irregular,  5 mm, dilated and fixed III,IV, VI/VIII: EOM absent V/VII: Facial sensation is absent to corneal stimulation with tissue VIII: No response to voice X/XI: Absent cough/gag XII: Unable to assess tongue protrusion secondary to patient's mental status  Motor/Sensory: Tone is normal. Bulk is normal.  No movement to noxious stim in any of the extremities   I have reviewed labs in epic and the results pertinent to this consultation are:  Basic Metabolic Panel: Recent Labs  Lab 05/25/22 0949 05/15/2022 1950 06/11/2022 2006 05/31/22 1455 05/31/22 1457 05/31/22 1553 05/31/22 2030  NA 139 135  --  134* 141 135 125*  K 3.6 2.9*  --  3.9 <2.0* 3.2* 2.7*  CL 108 100  --  97* 126* 102 93*  CO2 21* 26  --   --  12* 21* 18*  GLUCOSE 154*  117*  --  217* 118* 266* 659*  BUN 10 13  --  8 5* 10 13  CREATININE 0.66 0.53  --  0.60 <0.30* 0.90 0.98  CALCIUM 9.2 8.6*  --   --  <4.0* 7.4* 6.7*  MG 2.2  --  1.8  --   --   --   --    Lab Results  Component Value Date   ALT 758 (H) 05/31/2022   AST 813 (H) 05/31/2022   ALKPHOS 76 05/31/2022   BILITOT 0.7 05/31/2022    CBC: Recent Labs  Lab 05/25/22 0949 06/07/2022 1950 05/31/22 1455 05/31/22 1457 05/31/22 1553 05/31/22 2030 06-12-2022 0053  WBC 5.1 8.8  --  3.2* 12.5* 10.2 13.2*  NEUTROABS 4.3 6.0  --  1.3* DUPLICATE SEE ACC [id number]  --   --   HGB 9.6* 12.0 10.9* 5.3* 9.5* 12.9 15.3*  HCT 28.5* 36.2 32.0* 17.7* 30.5* 39.7 45.6  MCV 88.0 88.1  --  99.4 94.4 91.7 89.1  PLT 198 221  --  102* 167 170 185    Coagulation Studies: Recent Labs    05/31/22 2030  LABPROT 17.5*  INR 1.4*      I have reviewed the images obtained:  Head CT today personally reviewed.  On radiology read hypodensities noted to be concerning for stroke but edema looks vasogenic on my evaluation concerning for potential underlying metastatic disease versus abscess versus continued autoimmune encephalitis process.  Personally compared with 05/22/2022 head CT which did not have any clear acute intracranial abnormality  Cerebellar EEG no seizure activity on Dr. Karolee Stamps brief review.  On my brief review later, tracing is fairly flat  Assessment: Neurological examination is highly concerning for significant anoxic brain injury although it is much too early for formal prognostication at this time. Vasogenic on my evaluation concerning for potential underlying metastatic disease versus abscess (feel this is less likely given no hyperdense rim) versus evidence of worsening autoimmune encephalitis process; unclear etiology of rapid appearance since head CT from 11/9. PRES could also be on the differential, she is charted as having been hypertensive at 1330 (192/92).  EEG findings reassuring against seizure but  potentially consistent with diffuse brain injury as well, though this is also very limited EEG  Impression: -S/p PEA arrest with unknown downtime and over 20 minutes of CPR prior to ROSC, on multiple pressors -Transaminitis -S/p blood transfusion for possible acute hemoglobin drop  -Multifocal hypodensities consistent with vasogenic edema -11/8 blood culture positive for Moraxella (notably treated with IV Levaquin and discharged on moxifloxacin from ED for c/f aspiration pneumonia prior to presentation on 11/9, resulted 11/11) -Recurrent confusion in  the setting of recent pulse dose steroids for concern for autoimmune encephalitis 11/10- 11/14  Recommendations: -When stabilized, if renal function allows, repeat dry head CT with CTA, CT venogram, and 5-minute delayed head CT postcontrast  -May discontinue Ceribell EEG when patient is taken for scans -Appreciate excellent continued supportive care by critical care team -Neurology will continue to follow   [place] [phone] Available 7 PM to 7 AM, outside of these hours please call Neurologist on call as listed on Amion.   CRITICAL CARE Performed by: Lorenza Chick   Total critical care time: 60 minutes  Critical care time was exclusive of separately billable procedures and treating other patients.  Critical care was necessary to treat or prevent imminent or life-threatening deterioration.  Critical care was time spent personally by me on the following activities: development of treatment plan with patient and/or surrogate as well as nursing, discussions with consultants, evaluation of patient's response to treatment, examination of patient, obtaining history from patient or surrogate, ordering and performing treatments and interventions, ordering and review of laboratory studies, ordering and review of radiographic studies, pulse oximetry and re-evaluation of patient's condition.

## 2022-05-31 NOTE — Procedures (Signed)
Arterial Catheter Insertion Procedure Note  Amber White  [id number]  June 18, 1976  Date:05/31/22  Time:7:28 PM    Provider Performing: Maryjane Hurter    Procedure: Insertion of Arterial Line [phone]) with US guidance ([id number])   Indication(s) Blood pressure monitoring and/or need for frequent ABGs  Consent Risks of the procedure as well as the alternatives and risks of each were explained to the patient and/or caregiver.  Consent for the procedure was obtained and is signed in the bedside chart  Anesthesia None   Time Out Verified patient identification, verified procedure, site/side was marked, verified correct patient position, special equipment/implants available, medications/allergies/relevant history reviewed, required imaging and test results available.   Sterile Technique Maximal sterile technique including full sterile barrier drape, hand hygiene, sterile gown, sterile gloves, mask, hair covering, sterile ultrasound probe cover (if used).   Procedure Description Area of catheter insertion was cleaned with chlorhexidine and draped in sterile fashion. With real-time ultrasound guidance an arterial catheter was placed into the right femoral artery.  Appropriate arterial tracings confirmed on monitor.     Complications/Tolerance None; patient tolerated the procedure well.   EBL Minimal   Specimen(s) None

## 2022-05-31 NOTE — ED Notes (Signed)
Patient c/o migraine.  MD made aware and patient medicated

## 2022-05-31 NOTE — ED Provider Notes (Signed)
2:53 PM I was called to bedside, patient coding.  Following CPR, IV access patient was intubated, and after ~25 min CPR ROSC was obtained.  Patient had stat x-ray, and ET tube was withdrawn 2 cm subsequently.  Stat labs with reassuring potassium.  Patient transition from regular room to resuscitation bay.  I discussed patient's decompensation with family members, and after patient lost pulses again I discussed with family members at bedside the patient's presentation.  After second loss of pulses, successful ROSC was again achieved.  Patient started on additional vasopressors.  Subsequent chemistry panel likely lab error as it sat at bedside for some time for processing, but repeat labs sent.  I discussed patient's case with her critical care team, and after hemoglobin was found to be critically abnormal, 5.3 emergency release blood was ordered.  No obvious cause for acute decompensation, some suspicion for cardiomyopathy versus high-output heart failure, but patient has no edema, lung sounds are clear.  Patient transition to critical care.  Patient's first ECG postarrest was notable for substantial ST changes, after second resuscitation, initiation of epi, nor epi, vasopressin following ECG is more normal, less evidence for diffuse global ischemia.  .   INTUBATION Performed by: Carmin Muskrat  Required items: required blood products, implants, devices, and special equipment available Patient identity confirmed: provided demographic data and hospital-assigned identification number Time out: Immediately prior to procedure a "time out" was called to verify the correct patient, procedure, equipment, support staff and site/side marked as required.  Indications: airway protection, CPR  Intubation method: Glidescope Laryngoscopy   Preoxygenation: BVM  Sedatives: not required  Tube Size: 7.5 cuffed  Post-procedure assessment: chest rise and ETCO2 monitor Breath sounds: equal and absent over the  epigastrium Tube secured with: ETT holder Chest x-ray interpreted by radiologist and me.  Chest x-ray findings: endotracheal tube in appropriate position  Patient tolerated the procedure well with no immediate complications.  Cardiopulmonary Resuscitation (CPR) Procedure Note Directed/Performed by: Carmin Muskrat I personally directed ancillary staff and/or performed CPR in an effort to regain return of spontaneous circulation and to maintain cardiac, neuro and systemic perfusion.    CRITICAL CARE Performed by: Carmin Muskrat Total critical care time: 50 minutes Critical care time was exclusive of separately billable procedures and treating other patients. Critical care was necessary to treat or prevent imminent or life-threatening deterioration. Critical care was time spent personally by me on the following activities: development of treatment plan with patient and/or surrogate as well as nursing, discussions with consultants, evaluation of patient's response to treatment, examination of patient, obtaining history from patient or surrogate, ordering and performing treatments and interventions, ordering and review of laboratory studies, ordering and review of radiographic studies, pulse oximetry and re-evaluation of patient's condition.       Carmin Muskrat, MD 05/31/22 [phone]

## 2022-05-31 NOTE — Progress Notes (Signed)
Assisted with transporting PT from [facility] CT to [place] while on vent (100% fi02)- uneventful. RN remains at bedside.

## 2022-05-31 NOTE — Telephone Encounter (Signed)
Post ED Visit - Positive Culture Follow-up  Culture report reviewed by antimicrobial stewardship pharmacist: [place] Team '[]'$  Elenor Quinones, Pharm.D. '[]'$  Heide Guile, Pharm.D., BCPS AQ-ID '[]'$  Parks Neptune, Pharm.D., BCPS '[]'$  Alycia Rossetti, Pharm.D., BCPS '[]'$  [place], Pharm.D., BCPS, AAHIVP '[]'$  Legrand Como, Pharm.D., BCPS, AAHIVP '[]'$  Salome Arnt, PharmD, BCPS '[]'$  Johnnette Gourd, PharmD, BCPS '[]'$  Hughes Better, PharmD, BCPS '[]'$  Leeroy Cha, PharmD '[]'$  Laqueta Linden, PharmD, BCPS '[]'$  Albertina Parr, PharmD  [place] Team '[]'$  Leodis Sias, PharmD '[]'$  Lindell Spar, PharmD '[]'$  Royetta Asal, PharmD '[]'$  Graylin Shiver, Rph '[]'$  Rema Fendt) Glennon Mac, PharmD '[]'$  Arlyn Dunning, PharmD '[]'$  Netta Cedars, PharmD '[]'$  Dia Sitter, PharmD '[]'$  Leone Haven, PharmD '[]'$  Gretta Arab, PharmD '[]'$  Theodis Shove, PharmD '[]'$  Peggyann Juba, PharmD '[]'$  Reuel Boom, PharmD   Positive blood culture Treated with levofloxacin, currently in ED receiving treatment, no further patient follow-up is required at this time.  Hazle Nordmann 05/31/2022, 2:19 PM

## 2022-05-31 NOTE — Telephone Encounter (Unsigned)
Paged by critical care physician (Nate) at [facility] hospital for post arrest patient.   38F with high grade R breast CA (invasive ductal carcinoma, dc 10/2020) who was recently treated last week for pneumonia at [facility] long ED and was discharged Thursday on antibiotics.  Her mother provided all of her history to the physicians at [facility].   She was first evaluated 05/21/22 for N/V of 2 days duration.  She denied any chest pain or shortness of breath and her only notable medical history was breast cancer. VS at that evaluation notable for tachycardia (HR 144), otherwise WNLs (BP 110/90, T 97.5 F, O2 99%).  She reported weakness, headache, vomiting and diarrhea.  No cough or fevers. ECG with SVT vs sinus tach. She was given 2 L IVFs.  Tachycardia likely sinus as it improved with IV fluids.  CT scan was only notable for possible pneumonia.  She was given IV Levaquin and discharged on moxifloxacin.   She came back to the emergency department a day after discharge with worsening mental status changes.  Neurology had evaluated and there was concern for autoimmune postinfectious versus paraneoplastic encephalitis with her rapid decline following presumed viral illness.  Empiric thiamine was given and multiple labs were checked along with EEG and lumbar puncture.  MRI brain was unable to be obtained secondary to breast expanders.  Bcx later resulted positive for Moraxella (1/2). Flu/covid were negative as well as blood Cx panel. Hb was 11.2-10.6 (bl 9-10), Mg 1.5. Lactic acid was nl (1.6). hsT 13. Mild AG (12) with metabolic acidosis (bicarb 17). Procal was nl. LP performed and with 102-159 RBCs but only 2-3 WBCs but with elevated protein (119) and nl glucose (51). Mg consistently low (1.5-1.4-1.5).   She was more oriented following thiamine supplementation along with IC Solu-Medrol but continued to have motor apraxia and tangential speech with confusion.  EEG without any seizure activity.  LP was not consistent  with infection.  She was treated with meropenem as an inpatient for 1 of 4 bottles positive for GNR's which resulted as Moraxella as above.  She was transition to [place] on discharge (05/28/22) with plans for 7 days of antibiotics as an outpatient.  She also has prior adrenal insufficiency and was on Hydrocort at home.  I reviewed her initial ECG which is c/f posterior STEMI with significant ST depressions in V3 and tall R waves along with ST depressions in lateral leads and elevation in aVR as well as ST depressions in inferior leads.  V7-V9 posterior ECG however without ST elevations.  Additionally patient is GCS 3 with hemoglobin of 5.3 and 20-25 minutes of CPR for PEA arrest so I advised against immediate ACS care with full dose anticoagulation and antiplatelet therapy unless the value was spurious.  She was given 2 units emergency release PRBCs and is on 3 pressors with appropriate hemoglobin response.  CT with multifocal hypodensities consistent with vasogenic edema.  There is concern for medication overdose.  Troponins are elevated however this is post PEA arrest with 20+ minutes of downtime.  Despite her abnormal ECG I do not think that primary ACS is the most likely current driver of her symptoms and given all of the above I would not empirically treat with heparin/ASA until we see that she is more stable.

## 2022-05-31 NOTE — ED Notes (Addendum)
Pt has been dressed out in scrubs and her belongings gathered.   Bag 1  Jacket Bag 2  Shirt, shoes, pants, and socks  Belongings are in patient cabinets in 1-8: "Patients Belongings 5-8"  Security has wanded the patient.

## 2022-05-31 NOTE — ED Notes (Signed)
Patient c/o migraine pain. Rated 10/10. Notified EDP. Order for tylenol. This nurse administered tylenol to patient.

## 2022-05-31 NOTE — ED Notes (Signed)
Per the patient's RN, She found the patient unresponsive during her hourly rounding. A code commenced. The time line is as follows: 1430 CPR began 1432, 1435, 1436, 1438, 1440 Pulse check

## 2022-05-31 NOTE — Progress Notes (Signed)
This RN and CCMD Leslye Peer at bedside with patient. This RN and Verlee Monte, MD saw multiple rhythm changes on EKG monitor. At approximately 1625 patient's heart rate dropped from 150s-90s bpm. At approximately 1630 patient's heart rhythm was 116 but rhythm was widened. No pulse palpated by this RN. Pulse not auscultated by doppler. Patient in PEA rhythm. BP not attainable. At approximately 1845, patient had organized rhythm on monitor. BP 87/76 (81). Patient has palpable pulse at this time. RN and MD continues to be at bedside.

## 2022-05-31 NOTE — ED Notes (Signed)
Notified MD of critical Hgb 5.3. Verbal order to transfuse 2 units emergency release PRBC. Blood bank called and notified of same.

## 2022-05-31 NOTE — Consult Note (Incomplete)
Neurology Consultation Reason for Consult: Hypodensities on head CT Requesting Physician: Sherrilyn Rist  CC: Somnolence, depression  History is obtained from:***Chart review  HPI: Amber White is a 46 y.o. 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 with pulse dose steroids 10/31 - 11/14, with improvement but not full resolution of her confusion and motor apraxia  MRI could not be obtained due to noncompatible breast expanders in place Autoimmune encephalitis panel still pending Thyroid antibodies were negative Thiamine resulted mildly low at 54.9, she was supplemented IV on admission Long-term EEG monitoring was notable only for continuous generalized slowing (11/10 0453 through 11/11 1145)  She was discharged on 11/15, but then presented on 11/17 as she was "talking out of her head" and experiencing hallucinations, doing nothing but sleeping and not interacting with her children.  She was sent to [facility] for medical clearance, and complained of some migraines during her stay.  She also had an episode around 11:39 AM when she fell seemed all of a sudden weak, had urinated on herself and was tired but moving all extremities slowly.  They were planning continued observation overnight but she was subsequently found pulseless and apneic at 2:30 PM.  Last documented interaction was at 1:35 PM with pharmacy technician where patient was not engaging well in conversation.  After 18-25 minutes of CPR ROSC was obtained (PEA rhythm, no shocks), patient was intubated, but  patient lost pulses again requiring a second round of CPR (1-2 rounds of CPR).  Hemoglobin was found to be 5.3 and she received 2 units of emergency release packed red blood cells   LKW: *** Thrombolytic given?: No, or if yes, time given ***  Checklist of contraindications was reviewed and negative. Risks, benefits and alternatives were discussed *** IA performed?: No, or if yes, groin puncture time: *** Premorbid modified rankin scale: ***     0 - No symptoms.     1 - No significant disability. Able to carry out all usual activities, despite some symptoms.     2 - Slight disability. Able to look after own affairs without assistance, but unable to carry out all previous activities.     3 - Moderate disability. Requires some help, but able to walk unassisted.     4 - Moderately severe disability. Unable to attend to own bodily needs without assistance, and unable to walk unassisted.     5 - Severe disability. Requires constant nursing care and attention, bedridden, incontinent.     6 - Dead. ICH Score: ***  Time performed: *** GCS: {Blank single:19197::"3-4 is 2 points","5-12 is 1 point","13-15 is 0 points"} Infratentorial: {yes no:314532}. If yes, 1 point Volume: {Blank single:19197::">30cc is 1 point","<30cc is 0 points"}  Age: 46 y.o.. >80 is 1 point Intraventricular extension is 1 point  Score:***  A Score of {Blank single:19197::"0 points has a 30 day mortality of 0%","1 points has a 30 day mortality of 13%","2 points has a 30 day mortality of 26%","3 points has a 30 day mortality of 72%","4 points has a 30 day mortality of 97%","5-6 points has a 30 day mortality of 100%"}. Stroke. 2001 Apr;32(4):891-7.    ROS: All other review of systems was negative  except as noted in the HPI. *** Unable to obtain due to altered mental status.   Past Medical History:  Diagnosis Date  . Adrenal insufficiency ([place]) 05/2021   in setting of chemotherapy  . Anemia   . breast ca dx'd 10/2020    right  . Cardiomyopathy ([place]) 05/2021   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  . Family history of breast cancer   . History of radiation therapy    right breast  08/12/2021-09/20/2021  Dr Gery Pray  . Hypertension   . 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   ***  Family History  Problem Relation Age of Onset  . Breast cancer Mother   . Diabetes Maternal Uncle   . Diabetes Maternal Grandmother   . Heart disease Maternal Grandmother    ***  Social History:  reports that she has never smoked. She has never used smokeless tobacco. She reports current alcohol use. She reports that she does not use drugs. ***  Exam: Current vital signs: BP (!) 157/137   Pulse (!) 132   Temp (!) 93 F (33.9 C) (Axillary)   Resp (!) 28   Ht '5\' 4"'$  (1.626 m)   Wt 98 kg   LMP 10/26/2020 Comment: UPT neg DOS  SpO2 98%   BMI 37.09 kg/m  Vital signs in last 24 hours: Temp:  [93 F (33.9 C)-98.2 F (36.8 C)] 93 F (33.9 C) (11/18 2200) Pulse Rate:  [70-150] 132 (11/18 2200) Resp:  [0-35] 28 (11/18 2200) BP: (59-192)/(36-137) 157/137 (11/18 1900) SpO2:  [93 %-100 %] 98 % (11/18 2200) Arterial Line BP: (64-144)/(45-112) 144/112 (11/18 2200) FiO2 (%):  [100 %] 100 % (11/18 1949)   Physical Exam  Constitutional: Appears well-developed and well-nourished.  Psych: Affect appropriate to situation, *** Eyes: No scleral injection HENT: No oropharyngeal obstruction.  MSK: no joint deformities.  Cardiovascular: Normal rate and regular rhythm. *** Perfusing extremities well Respiratory: Effort normal, non-labored breathing GI: Soft.  No distension. There is no tenderness.  Skin: Warm dry and intact visible skin  Neuro: Mental Status: Patient is awake, alert, oriented to person, place, month, year, and situation.*** Patient is able to give a clear and coherent history.*** No signs of aphasia or neglect*** Cranial Nerves: II: Visual Fields are full. Pupils are equal, round, and  reactive to light.  *** III,IV, VI: EOMI without ptosis or diploplia.  V: Facial sensation is symmetric to temperature VII: Facial movement is symmetric.  VIII: hearing is intact to voice X: Uvula elevates symmetrically XI: Shoulder shrug is symmetric. XII: tongue is midline without atrophy or fasciculations.  Motor: Tone is normal. Bulk is normal. 5/5 strength was present in all four extremities. *** Sensory: Sensation is symmetric to light touch and temperature in the arms and legs.*** Deep Tendon Reflexes: 2+ and symmetric in the brachioradialis and patellae. *** Plantars: Toes are downgoing bilaterally. *** Cerebellar: FNF and HKS are intact bilaterally*** Gait:  Deferred in acute setting ***  NIHSS total *** Score breakdown: *** Performed at *** time of patient arrival to ED    I have reviewed labs in epic and the results pertinent to this consultation are: ***  I have reviewed the images obtained:***   Impression: ***  Recommendations: - ***   Lesleigh Noe MD-PhD [facility] [phone]   *** [facility], [facility], Teleneuro    Total critical care time: *** minutes   Critical care time was exclusive of separately billable procedures and treating other patients.   Critical  care was necessary to treat or prevent imminent or life-threatening deterioration.   Critical care was time spent personally by me on the following activities: development of treatment plan with patient and/or surrogate as well as nursing, discussions with consultants/primary team, evaluation of patient's response to treatment, examination of patient, obtaining history from patient or surrogate, ordering and performing treatments and interventions, ordering and review of laboratory studies, ordering and review of radiographic studies, and re-evaluation of patient's condition as needed, as documented above.

## 2022-05-31 NOTE — H&P (Addendum)
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 Developed migraine during her eval. Had potassium administered. Later during medical evauation she had sudden weakness, urinated on herself. An hour or so later was found unresponsive, pulseless and ACLS started with ROSC after 18 min (presenting PEA, no shocks during arrest), had another brief arrest with ROSC after 1-2 rounds CPR. Remains unresponsive post arrest. Post arrest EKG concerning initially for ischemia and Hb of 5 for which 2U pRBC ordered. She was transferred to ICU on epi 15, levo 5, vaso.  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 rate 20, height '5\' 4"'$  (1.626 m), weight 98 kg, last menstrual period 10/26/2020, SpO2 94 %.    Vent Mode: PRVC FiO2 (%):  [100 %] 100 % Set Rate:  [18 bmp-28 bmp] 28 bmp Vt Set:  [430 mL] 430 mL PEEP:  [5 cmH20-10 cmH20] 10 cmH20 Plateau Pressure:  [20 cmH20] 20 cmH20  No intake or output data in the 24 hours ending 05/31/22 1708 Filed Weights    05/29/2022 2037  Weight: 98 kg    Examination: General appearance: 46 y.o., female, unresponsive Eyes: dilated, not reactive Lungs: mech breath sounds bl, with normal respiratory effort CV: tachy RR, no murmur  Abdomen: Soft, non-tender; non-distended, BS present  Extremities: No peripheral edema, warm Skin: Normal turgor and texture; no rash Neuro: unresponsive to noxious stim   Resolved Hospital Problem list     Assessment & Plan:   # post arrest care: Unclear precipitant, immediate post arrest EKG concerning for ischemia, awaiting repeat, have discussed with cardiology. ?seizure and aspiration with undiscovered brain mets, hypokalemia + fluoroquinolone but never had shockable rhythm, ?worsening septic shock from moraxella bacteremia but wouldn't expect it to progress so abruptly. - repeat EKG  - normothermia - EEG - formal TTE - avoid sedation if possible, watch for neurologic recovery  # Shock May simply be vasoplegic post arrest - CT CAP - vanc/merrem, follow cultures and narrow as able - f/u response to 2U pRBC - trend cbc, check coags - TTE as above - start stress dose steroids - vaso - wean levo for map 65 - epi at 5  # Metabolic acidosis - trend bmet, trend lactic - bicarb gtt for now  # Possible acute blood loss anemia - f/u CT CAP - trend cbc - iv ppi bid  # Transaminitis Likely shock liver - trend lfts  # Adrenal insufficiency - stress dose steroids as above   Best Practice (right click and "Reselect all SmartList Selections" daily)   Diet/type: NPO DVT prophylaxis: SCD GI prophylaxis: PPI Lines: Central line Foley:  Yes, and it is still  needed Code Status:  DNR Last date of multidisciplinary goals of care discussion [family updated at bedside 11/18 including pt's mother and father]  Labs   CBC: Recent Labs  Lab 05/25/22 0949 05/14/2022 1950 05/31/22 1455 05/31/22 1457 05/31/22 1553  WBC 5.1 8.8  --  3.2* 12.5*  NEUTROABS 4.3 6.0   --  1.3* PENDING  HGB 9.6* 12.0 10.9* 5.3* 9.5*  HCT 28.5* 36.2 32.0* 17.7* 30.5*  MCV 88.0 88.1  --  99.4 94.4  PLT 198 221  --  102* 941    Basic Metabolic Panel: Recent Labs  Lab 05/25/22 0949 06/05/2022 1950 06/08/2022 2006 05/31/22 1455 05/31/22 1457 05/31/22 1553  NA 139 135  --  134* 141 135  K 3.6 2.9*  --  3.9 <2.0* 3.2*  CL 108 100  --  97* 126* 102  CO2 21* 26  --   --  12* 21*  GLUCOSE 154* 117*  --  217* 118* 266*  BUN 10 13  --  8 5* 10  CREATININE 0.66 0.53  --  0.60 <0.30* 0.90  CALCIUM 9.2 8.6*  --   --  <4.0* 7.4*  MG 2.2  --  1.8  --   --   --    GFR: Estimated Creatinine Clearance: 86.9 mL/min (by C-G formula based on SCr of 0.9 mg/dL). Recent Labs  Lab 05/25/22 0949 06/07/2022 1950 05/31/22 1457 05/31/22 1553  WBC 5.1 8.8 3.2* 12.5*    Liver Function Tests: Recent Labs  Lab 05/19/2022 1950 05/31/22 1553  AST 20 813*  ALT 31 758*  ALKPHOS 44 76  BILITOT 0.6 0.7  PROT 7.2 4.6*  ALBUMIN 3.4* 2.0*   No results for input(s): "LIPASE", "AMYLASE" in the last 168 hours. No results for input(s): "AMMONIA" in the last 168 hours.  ABG    Component Value Date/Time   PHART 7.11 (LL) 05/31/2022 1601   PCO2ART 22 (L) 05/31/2022 1601   PO2ART <31 (LL) 05/31/2022 1601   HCO3 7.3 (L) 05/31/2022 1601   TCO2 23 05/31/2022 1455   ACIDBASEDEF 21.0 (H) 05/31/2022 1601   O2SAT <4 05/31/2022 1601     Coagulation Profile: No results for input(s): "INR", "PROTIME" in the last 168 hours.  Cardiac Enzymes: No results for input(s): "CKTOTAL", "CKMB", "CKMBINDEX", "TROPONINI" in the last 168 hours.  HbA1C: No results found for: "HGBA1C"  CBG: Recent Labs  Lab 05/31/22 1140  GLUCAP 118*    Review of Systems:   Unable to obtain in setting of acute encephalopathy  Past Medical History:  She,  has a past medical history of Adrenal insufficiency ([place]) (05/2021), Anemia, breast ca (dx'd 10/2020), Cardiomyopathy ([place]) (05/2021), Family history of breast  cancer, History of radiation therapy, Hypertension, and Peripheral neuropathy (04/2021).   Surgical History:   Past Surgical History:  Procedure Laterality Date   APPLICATION OF A-CELL OF CHEST/ABDOMEN Left 04/01/2022   Procedure: ACELLULAR DERMIS TO LEFT CHEST;  Surgeon: Irene Limbo, MD;  Location: [place];  Service: Plastics;  Laterality: Left;   CHOLECYSTECTOMY     INCISION AND DRAINAGE OF WOUND Right 04/04/2022   Procedure: IRRIGATION AND DEBRIDEMENT OF CHEST , POSSIBLE REMOVAL OF TISSUE EXPANDER;  Surgeon: Irene Limbo, MD;  Location: [place];  Service: Plastics;  Laterality: Right;   LATISSIMUS FLAP TO BREAST Right 04/01/2022   Procedure: RIGHT LATISSIMUS DORSI FLAP TO RIGHT CHEST;  Surgeon: Irene Limbo, MD;  Location: [place];  Service: Plastics;  Laterality: Right;   MODIFIED MASTECTOMY Right 06/27/2021  Procedure: RIGHT MODIFIED RADICAL MASTECTOMY;  Surgeon: Donnie Mesa, MD;  Location: [place];  Service: General;  Laterality: Right;   PORT-A-CATH REMOVAL Right 11/05/2021   Procedure: REMOVAL PORT-A-CATH;  Surgeon: Donnie Mesa, MD;  Location: [place];  Service: General;  Laterality: Right;   PORTACATH PLACEMENT N/A 11/21/2020   Procedure: INSERTION PORT-A-CATH;  Surgeon: Donnie Mesa, MD;  Location: [place];  Service: General;  Laterality: N/A;   TISSUE EXPANDER PLACEMENT Bilateral 04/01/2022   Procedure: BILATERAL BREAST RECONSTRUCTION WITH PLACEMENT OF TISSUE EXPANDERS;  Surgeon: Irene Limbo, MD;  Location: [place];  Service: Plastics;  Laterality: Bilateral;   TOTAL MASTECTOMY Left 06/27/2021   Procedure: LEFT TOTAL MASTECTOMY;  Surgeon: Donnie Mesa, MD;  Location: [place];  Service: General;  Laterality: Left;     Social History:   reports that she has never smoked. She has never used smokeless tobacco. She reports current alcohol use. She reports that she does not use drugs.   Family History:  Her family history includes  Breast cancer in her mother; Diabetes in her maternal grandmother and maternal uncle; Heart disease in her maternal grandmother.   Allergies Allergies  Allergen Reactions   Paclitaxel Nausea Only and Other (See Comments)    Complained of chest burning and nausea. Resolved with famotidine, methylprednisolone, and diphenhydramine. Resumed paclitaxel and completed without further incident.    Penicillins Hives     Home Medications  Prior to Admission medications   Medication Sig Start Date End Date Taking? Authorizing Provider  bisoprolol (ZEBETA) 10 MG tablet TAKE 1 TABLET BY MOUTH EVERY DAY Patient taking differently: Take 10 mg by mouth daily. 01/02/22  Yes Nicholas Lose, MD  doxycycline (ADOXA) 100 MG tablet Take 100 mg by mouth daily. Continuous course.   Yes [provider]  levofloxacin (LEVAQUIN) 750 MG tablet Take 1 tablet (750 mg total) by mouth daily for 7 days. 05/28/22 06/04/22 Yes Pokhrel, Laxman, MD  moxifloxacin (AVELOX) 400 MG tablet Take 400 mg by mouth daily at 6 (six) AM. 7 day course.   Yes [provider]  ondansetron (ZOFRAN) 4 MG tablet Take 1 tablet (4 mg total) by mouth every 6 (six) hours. 05/22/22  Yes Pollina, Gwenyth Allegra, MD  potassium chloride SA (KLOR-CON M) 20 MEQ tablet Take 1 tablet (20 mEq total) by mouth 2 (two) times daily for 3 days. 05/31/22 06/03/22 Yes Sherwood Gambler, MD  hydrocortisone (CORTEF) 5 MG tablet TAKE 1 TABLET BY MOUTH IN THE MORNING AND 1/2 TABLET DAILY BEFORE SUPPER. Patient not taking: Reported on 05/22/2022 05/16/22   Nicholas Lose, MD  thiamine (VITAMIN B1) 100 MG tablet Take 1 tablet (100 mg total) by mouth daily. 05/28/22 09/05/22  Pokhrel, Corrie Mckusick, MD  prochlorperazine (COMPAZINE) 10 MG tablet Take 1 tablet (10 mg total) by mouth every 6 (six) hours as needed (Nausea or vomiting). 11/07/20 04/25/21  Nicholas Lose, MD     Critical care time: 45 minutes

## 2022-05-31 NOTE — ED Notes (Addendum)
1430 CPR began 1432, 1435, 1436, 1438, 1440, 1443, 1145, 1448, 1450(femoral felt) Pulse check 1440 Lucas applied 1445 Epi given, intubation start time, IV placed in right AC 1447 IV placed in right hand 1448 Bicarb given 1449 Epi given 1452 Epi given 1454 x-ray 1503 Epi drip started, BP 71/36 (47) 1507 BP 117/90 1509 OG placed 1512 Temp foley placed

## 2022-05-31 NOTE — ED Provider Notes (Addendum)
Discussed with psychiatry.  They have cleared the patient.  She denies any acute psychiatric complaints.  She is developing a mild headache and was given Tylenol.  Otherwise her labs were reviewed/interpreted and she has hypokalemia but no other significant findings.  We will replete potassium as an outpatient but she otherwise appears stable for discharge.   Sherwood Gambler, MD 05/31/22 1017  11:39 AM I was called to see patient. She all of a sudden seems weak and staff couldn't lift her up. She urinated on herself. When I saw her, she was speaking to me and not altered.  She is not sure why she feels like she does but she just feels very tired.  It is unclear if she is having catatonia or acute medical problem.  She is moving all extremities though slowly. No evidence of a seizure.  We will have psych reassess.  Ricky Ala re-evaluated.  Agrees with watching her overnight and reassessing. When the nurse went to reassess her around 2:15 she was found to be pulseless and apneic. A code was called and Dr. Vanita Panda responded and managed her case from there. Please see his note.   Sherwood Gambler, MD 05/31/22 1513  I was handed her post-ROSC EKG. There appears to be diffuse ischemia and tachycardia. I was discussing case with Dr. Stanford Breed of cardiology and whether we should activate cath lab. While on the phone she coded again and CPR was started. Dr. Vanita Panda present for resuscitation.   Sherwood Gambler, MD 05/31/22 [phone]

## 2022-05-31 NOTE — ED Notes (Signed)
Zion hugger applied 1524-No pulse palpated, cpr started. 1525-Pulse check, no pulse  1528- Pulse check, hr 152 1629-Right shoulder IO  1632-1 unit of blood started

## 2022-05-31 NOTE — ED Notes (Signed)
This nurse went to check on patient at 1430.  Upon entering room, patient eyes closed and mouth open. Attempted to arouse patient with no success. Foamy saliva noted in patient's mouth. No pulse present upon palpation This nurse yelled for NT to bring vital machine and for security to hit code button and notify charge while this nurse began cpr. NT unable to obtain reading with vital machine. NT began cpr.

## 2022-05-31 NOTE — Procedures (Signed)
Central Venous Catheter Insertion Procedure Note  Amber White  [id number]  06-Sep-1975  Date:05/31/22  Time:7:29 PM   Provider Performing:Paulina Muchmore Jerilynn Mages Verlee Monte   Procedure: Insertion of Non-tunneled Central Venous [phone]) with US guidance (29518)   Indication(s) Medication administration and Difficult access  Consent Risks of the procedure as well as the alternatives and risks of each were explained to the patient and/or caregiver.  Consent for the procedure was obtained and is signed in the bedside chart  Anesthesia Topical only with 1% lidocaine   Timeout Verified patient identification, verified procedure, site/side was marked, verified correct patient position, special equipment/implants available, medications/allergies/relevant history reviewed, required imaging and test results available.  Sterile Technique Maximal sterile technique including full sterile barrier drape, hand hygiene, sterile gown, sterile gloves, mask, hair covering, sterile ultrasound probe cover (if used).  Procedure Description Area of catheter insertion was cleaned with chlorhexidine and draped in sterile fashion.  With real-time ultrasound guidance a central venous catheter was placed into the right femoral vein. Nonpulsatile blood flow and easy flushing noted in all ports.  The catheter was sutured in place and sterile dressing applied.  Complications/Tolerance None; patient tolerated the procedure well. Chest X-ray is ordered to verify placement for internal jugular or subclavian cannulation.   Chest x-ray is not ordered for femoral cannulation.  EBL Minimal  Specimen(s) None

## 2022-05-31 NOTE — ED Provider Notes (Signed)
Emergency Medicine Observation Re-evaluation Note  Zamantha Strebel is a 46 y.o. female, seen on rounds today.  Pt initially presented to the ED for complaints of Depression Currently, the patient is sleeping.  Physical Exam  BP 133/82 (BP Location: Right Arm)   Pulse 83   Temp 98.1 F (36.7 C) (Oral)   Resp 19   Ht '5\' 4"'$  (1.626 m)   Wt 98 kg   LMP 10/26/2020 Comment: UPT neg DOS  SpO2 100%   BMI 37.09 kg/m  Physical Exam General: sleeping Cardiac: sleeping Lungs: sleeping Psych: sleeping  ED Course / MDM  EKG:   I have reviewed the labs performed to date as well as medications administered while in observation.  Recent changes in the last 24 hours include home meds and potassium replacement, which I ordered this morning.  Plan  Current plan is for psych assessment. H&P written yesterday by psychiatry, but appears she might be cleared from their perspective. Will have them reassess for potential discharge vs admission. Medically seems cleared for psychiatry.    Sherwood Gambler, MD 05/31/22 [phone]

## 2022-05-31 NOTE — ED Notes (Signed)
When vitals were being obtained at 1:35pm, pharmacy tech was at beside along with this nurse. Pharmacy tech attempting to ask patient questions about medications and patient was not engaging in conversation. Patient answered "yes" to first pharmacy question, but then would not answer any more. This nurse provided pharmacy tech with daughter Teneka Malmberg' number so pharmacy could obtain medicine list from family as member was not participating.

## 2022-06-01 ENCOUNTER — Inpatient Hospital Stay (HOSPITAL_COMMUNITY): Payer: No Typology Code available for payment source

## 2022-06-01 ENCOUNTER — Encounter (HOSPITAL_COMMUNITY): Payer: Self-pay | Admitting: Student

## 2022-06-01 DIAGNOSIS — R7989 Other specified abnormal findings of blood chemistry: Secondary | ICD-10-CM

## 2022-06-01 DIAGNOSIS — R579 Shock, unspecified: Secondary | ICD-10-CM | POA: Diagnosis not present

## 2022-06-01 DIAGNOSIS — I214 Non-ST elevation (NSTEMI) myocardial infarction: Secondary | ICD-10-CM | POA: Diagnosis not present

## 2022-06-01 DIAGNOSIS — R4182 Altered mental status, unspecified: Secondary | ICD-10-CM

## 2022-06-01 DIAGNOSIS — I469 Cardiac arrest, cause unspecified: Secondary | ICD-10-CM

## 2022-06-01 DIAGNOSIS — R9431 Abnormal electrocardiogram [ECG] [EKG]: Secondary | ICD-10-CM | POA: Diagnosis not present

## 2022-06-01 LAB — GLUCOSE, CAPILLARY
Glucose-Capillary: 382 mg/dL — ABNORMAL HIGH (ref 70–99)
Glucose-Capillary: 402 mg/dL — ABNORMAL HIGH (ref 70–99)
Glucose-Capillary: 366 mg/dL — ABNORMAL HIGH (ref 70–99)
Glucose-Capillary: 321 mg/dL — ABNORMAL HIGH (ref 70–99)
Glucose-Capillary: 343 mg/dL — ABNORMAL HIGH (ref 70–99)
Glucose-Capillary: 249 mg/dL — ABNORMAL HIGH (ref 70–99)
Glucose-Capillary: 220 mg/dL — ABNORMAL HIGH (ref 70–99)
Glucose-Capillary: 174 mg/dL — ABNORMAL HIGH (ref 70–99)
Glucose-Capillary: 190 mg/dL — ABNORMAL HIGH (ref 70–99)

## 2022-06-01 LAB — URINALYSIS, COMPLETE (UACMP) WITH MICROSCOPIC
Bilirubin Urine: NEGATIVE
Glucose, UA: >=500 mg/dL — AB
Ketones, ur: 20 mg/dL — AB
Leukocytes,Ua: NEGATIVE
Nitrite: NEGATIVE
Protein, ur: 100 mg/dL — AB
Specific Gravity, Urine: 1.011 (ref 1.005–1.030)
pH: 8 (ref 5.0–8.0)

## 2022-06-01 LAB — CBC
HCT: 45.6 % (ref 36.0–46.0)
Hemoglobin: 15.3 g/dL — ABNORMAL HIGH (ref 12.0–15.0)
MCH: 29.9 pg (ref 26.0–34.0)
MCHC: 33.6 g/dL (ref 30.0–36.0)
MCV: 89.1 fL (ref 80.0–100.0)
Platelets: 185 10*3/uL (ref 150–400)
RBC: 5.12 MIL/uL — ABNORMAL HIGH (ref 3.87–5.11)
RDW: 13.7 % (ref 11.5–15.5)
WBC: 13.2 10*3/uL — ABNORMAL HIGH (ref 4.0–10.5)
nRBC: 0 % (ref 0.0–0.2)

## 2022-06-01 LAB — COMPREHENSIVE METABOLIC PANEL
ALT: 831 U/L — ABNORMAL HIGH (ref 0–44)
AST: 1028 U/L — ABNORMAL HIGH (ref 15–41)
Albumin: 2.8 g/dL — ABNORMAL LOW (ref 3.5–5.0)
Alkaline Phosphatase: 54 U/L (ref 38–126)
Anion gap: 13 (ref 5–15)
BUN: 17 mg/dL (ref 6–20)
CO2: 19 mmol/L — ABNORMAL LOW (ref 22–32)
Calcium: 8 mg/dL — ABNORMAL LOW (ref 8.9–10.3)
Chloride: 98 mmol/L (ref 98–111)
Creatinine, Ser: 1.07 mg/dL — ABNORMAL HIGH (ref 0.44–1.00)
GFR, Estimated: >60 mL/min (ref >60)
Glucose, Bld: 425 mg/dL — ABNORMAL HIGH (ref 70–99)
Potassium: 3.6 mmol/L (ref 3.5–5.1)
Sodium: 130 mmol/L — ABNORMAL LOW (ref 135–145)
Total Bilirubin: 1 mg/dL (ref 0.3–1.2)
Total Protein: 6.2 g/dL — ABNORMAL LOW (ref 6.5–8.1)

## 2022-06-01 LAB — ECHOCARDIOGRAM COMPLETE
Area-P 1/2: 6.96 cm2
Calc EF: 14.1 %
Height: 64 in
S' Lateral: 4.3 cm
Single Plane A2C EF: 13.7 %
Single Plane A4C EF: 14.4 %
Weight: 3456.81 oz

## 2022-06-01 LAB — TRIGLYCERIDES: Triglycerides: 87 mg/dL (ref <150)

## 2022-06-01 LAB — TROPONIN I (HIGH SENSITIVITY): Troponin I (High Sensitivity): 1782 ng/L (ref <18)

## 2022-06-01 MED ORDER — DEXTROSE IN LACTATED RINGERS 5 % IV SOLN: INTRAVENOUS | Status: DC

## 2022-06-01 MED ORDER — DEXTROSE 50 % IV SOLN: 0.0000 mL | INTRAVENOUS | Status: DC | PRN

## 2022-06-01 MED ORDER — SODIUM CHLORIDE 0.9% FLUSH
10.0000 mL | Freq: Two times a day (BID) | INTRAVENOUS | Status: DC
  Administered 2022-06-01: 10 mL

## 2022-06-01 MED ORDER — IOHEXOL 350 MG/ML SOLN
75.0000 mL | Freq: Once | INTRAVENOUS | Status: AC | PRN
  Administered 2022-06-01: 75 mL via INTRAVENOUS

## 2022-06-01 MED ORDER — SODIUM CHLORIDE 0.9 % IV SOLN: INTRAVENOUS | Status: DC

## 2022-06-01 MED ORDER — THIAMINE MONONITRATE 100 MG PO TABS: 100.0000 mg | ORAL_TABLET | Freq: Every day | ORAL | Status: DC

## 2022-06-01 MED ORDER — GLYCOPYRROLATE 1 MG PO TABS: 1.0000 mg | ORAL_TABLET | ORAL | Status: DC | PRN

## 2022-06-01 MED ORDER — SODIUM CHLORIDE 0.9% FLUSH: 10.0000 mL | INTRAVENOUS | Status: DC | PRN

## 2022-06-01 MED ORDER — HALOPERIDOL LACTATE 5 MG/ML IJ SOLN: 2.5000 mg | INTRAMUSCULAR | Status: DC | PRN

## 2022-06-01 MED ORDER — GLYCOPYRROLATE 0.2 MG/ML IJ SOLN: 0.2000 mg | INTRAMUSCULAR | Status: DC | PRN

## 2022-06-01 MED ORDER — MORPHINE SULFATE (PF) 2 MG/ML IV SOLN: 2.0000 mg | INTRAVENOUS | Status: DC | PRN

## 2022-06-01 MED ORDER — POLYVINYL ALCOHOL 1.4 % OP SOLN: 1.0000 [drp] | Freq: Four times a day (QID) | OPHTHALMIC | Status: DC | PRN

## 2022-06-01 MED ORDER — LACTATED RINGERS IV SOLN: INTRAVENOUS | Status: DC

## 2022-06-01 MED ORDER — PERFLUTREN LIPID MICROSPHERE
1.0000 mL | INTRAVENOUS | Status: AC | PRN
  Administered 2022-06-01: 2 mL via INTRAVENOUS

## 2022-06-01 MED ORDER — ORAL CARE MOUTH RINSE
15.0000 mL | OROMUCOSAL | Status: DC
  Administered 2022-06-01 (×2): 15 mL via OROMUCOSAL

## 2022-06-01 MED ORDER — ACETAMINOPHEN 650 MG RE SUPP: 650.0000 mg | Freq: Four times a day (QID) | RECTAL | Status: DC | PRN

## 2022-06-01 MED ORDER — INSULIN REGULAR(HUMAN) IN NACL 100-0.9 UT/100ML-% IV SOLN
INTRAVENOUS | Status: DC
  Administered 2022-06-01: 28 [IU]/h via INTRAVENOUS
  Administered 2022-06-01: 19 [IU]/h via INTRAVENOUS
  Filled 2022-06-01 (×2): qty 100

## 2022-06-01 MED ORDER — ACETAMINOPHEN 325 MG PO TABS: 650.0000 mg | ORAL_TABLET | Freq: Four times a day (QID) | ORAL | Status: DC | PRN

## 2022-06-01 MED ORDER — ORAL CARE MOUTH RINSE: 15.0000 mL | OROMUCOSAL | Status: DC | PRN

## 2022-06-01 MED ORDER — MIDAZOLAM HCL 2 MG/2ML IJ SOLN: 2.0000 mg | INTRAMUSCULAR | Status: DC | PRN

## 2022-06-02 LAB — TYPE AND SCREEN
ABO/RH(D): O POS
Antibody Screen: NEGATIVE
Unit division: 0
Unit division: 0

## 2022-06-02 LAB — BPAM RBC
Blood Product Expiration Date: 202312032359
Blood Product Expiration Date: 202312032359
ISSUE DATE / TIME: 202311181642
ISSUE DATE / TIME: 202311181642
Unit Type and Rh: 9500
Unit Type and Rh: 9500

## 2022-06-03 LAB — SUSCEPTIBILITY, AER + ANAEROB

## 2022-06-03 LAB — SUSCEPTIBILITY RESULT

## 2022-06-06 LAB — CULTURE, BLOOD (ROUTINE X 2)
Culture: NO GROWTH
Culture: NO GROWTH
Special Requests: ADEQUATE
Special Requests: ADEQUATE

## 2022-06-10 LAB — MISC LABCORP TEST (SEND OUT)
Labcorp test code: 9985
Labcorp test code: 9985

## 2022-06-13 NOTE — Progress Notes (Signed)
Patient transferred from CT back to ICU without any issues.

## 2022-06-13 NOTE — Consult Note (Signed)
CARDIOLOGY CONSULT NOTE       Patient ID: Amber White MRN: [id number] DOB/AGE: [birth year] 46 y.o.  Admit date: 05/26/2022 Referring Physician: Verlee Monte Primary Physician: [name], MD Primary Cardiologist: New Reason for Consultation: Cardiac Arrest/Abnormal ECG  Principal Problem:   Depression Active Problems:   Cardiac arrest [facility])   HPI:  46 y.o. with advanced breast CA.receiving chemoRx. Rx last week for pneumonia. Admitted with headache, vomiting and diarrhea Was tachycardic. Bcx positive for Moraxella. She developed CNS symptoms with ? Paraneoplastic or autoimmune post infectious encephalitis. Has breast expanders and MRI unable to be done LP not consistent with infection. Progressed to motor apraxia and tangential speech with confusion. Then suffered  PEA arrest No shockable rhythm with 18 minutes before ROSC. Currently CT head and CT venogram show marked cerebral edema with herniation and likely sinus thrombosis Family at bedside intubated and non responsive  ECG with diffuse ST depression 3 mm and ST elevation in AVR TTE read by myself this am global hypokinesis EF 20-25% Troponin peak 1782  F/U ECG with resolution of ST depression just sinus tachycardia  ROS All other systems reviewed and negative except as noted above  Past Medical History:  Diagnosis Date   Adrenal insufficiency ([place]) 05/2021   in setting of chemotherapy   Anemia    breast ca dx'd 10/2020   right   Cardiomyopathy ([place]) 05/2021   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   Family history of breast cancer    History of radiation therapy    right breast  08/12/2021-09/20/2021  Dr Gery Pray   Hypertension    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    Family History  Problem Relation Age of Onset   Breast cancer Mother    Diabetes Maternal Uncle    Diabetes Maternal Grandmother    Heart disease Maternal Grandmother     Social History   Socioeconomic History   Marital status:  Single    Spouse name: Not on file   Number of children: 4   Years of education: Not on file   Highest education level: Not on file  Occupational History   Not on file  Tobacco Use   Smoking status: Never   Smokeless tobacco: Never  Vaping Use   Vaping Use: Never used  Substance and Sexual Activity   Alcohol use: Yes    Comment: rare (maybe once every 3-4 months)   Drug use: No   Sexual activity: Yes    Birth control/protection: None  Other Topics Concern   Not on file  Social History Narrative   Not on file   Social Determinants of Health   Financial Resource Strain: Not on file  Food Insecurity: No Food Insecurity (05/25/2022)   Hunger Vital Sign    Worried About Running Out of Food in the Last Year: Never true    Ran Out of Food in the Last Year: Never true  Transportation Needs: No Transportation Needs (05/26/2022)   PRAPARE - Hydrologist (Medical): No    Lack of Transportation (Non-Medical): No  Physical Activity: Not on file  Stress: Not on file  Social Connections: Not on file  Intimate Partner Violence: Not At Risk (05/26/2022)   Humiliation, Afraid, Rape, and Kick questionnaire    Fear of Current or Ex-Partner: No    Emotionally Abused: No    Physically Abused: No    Sexually Abused: No    Past Surgical History:  Procedure  Laterality Date   APPLICATION OF A-CELL OF CHEST/ABDOMEN Left 04/01/2022   Procedure: ACELLULAR DERMIS TO LEFT CHEST;  Surgeon: Irene Limbo, MD;  Location: [place];  Service: Plastics;  Laterality: Left;   CHOLECYSTECTOMY     INCISION AND DRAINAGE OF WOUND Right 04/04/2022   Procedure: IRRIGATION AND DEBRIDEMENT OF CHEST , POSSIBLE REMOVAL OF TISSUE EXPANDER;  Surgeon: Irene Limbo, MD;  Location: [place];  Service: Plastics;  Laterality: Right;   LATISSIMUS FLAP TO BREAST Right 04/01/2022   Procedure: RIGHT LATISSIMUS DORSI FLAP TO RIGHT CHEST;  Surgeon: Irene Limbo, MD;  Location: [place];  Service:  Plastics;  Laterality: Right;   MODIFIED MASTECTOMY Right 06/27/2021   Procedure: RIGHT MODIFIED RADICAL MASTECTOMY;  Surgeon: Donnie Mesa, MD;  Location: [place];  Service: General;  Laterality: Right;   PORT-A-CATH REMOVAL Right 11/05/2021   Procedure: REMOVAL PORT-A-CATH;  Surgeon: Donnie Mesa, MD;  Location: [place];  Service: General;  Laterality: Right;   PORTACATH PLACEMENT N/A 11/21/2020   Procedure: INSERTION PORT-A-CATH;  Surgeon: Donnie Mesa, MD;  Location: [place];  Service: General;  Laterality: N/A;   TISSUE EXPANDER PLACEMENT Bilateral 04/01/2022   Procedure: BILATERAL BREAST RECONSTRUCTION WITH PLACEMENT OF TISSUE EXPANDERS;  Surgeon: Irene Limbo, MD;  Location: [place];  Service: Plastics;  Laterality: Bilateral;   TOTAL MASTECTOMY Left 06/27/2021   Procedure: LEFT TOTAL MASTECTOMY;  Surgeon: Donnie Mesa, MD;  Location: [place];  Service: General;  Laterality: Left;      Current Facility-Administered Medications:    0.9 %  sodium chloride infusion, 250 mL, Intravenous, Continuous, Carmin Muskrat, MD   0.9 %  sodium chloride infusion, 250 mL, Intravenous, Continuous, Verlee Monte Hortencia Conradi, MD, Stopped at 2022/06/23 0739   acetaminophen (TYLENOL) tablet 650 mg, 650 mg, Oral, Q4H **OR** acetaminophen (TYLENOL) 160 MG/5ML solution 650 mg, 650 mg, Per Tube, Q4H, 650 mg at 06/23/2022 0619 **OR** acetaminophen (TYLENOL) suppository 650 mg, 650 mg, Rectal, Q4H, Meier, Hortencia Conradi, MD   busPIRone (BUSPAR) tablet 30 mg, 30 mg, Oral, Q8H PRN **OR** busPIRone (BUSPAR) tablet 30 mg, 30 mg, Per Tube, Q8H PRN, Maryjane Hurter, MD   Chlorhexidine Gluconate Cloth 2 % PADS 6 each, 6 each, Topical, Daily, Maryjane Hurter, MD   dextrose 5 % in lactated ringers infusion, , Intravenous, Continuous, Maryjane Hurter, MD, Last Rate: 125 mL/hr at 06/23/22 0944, New Bag at 06/23/2022 0944   dextrose 50 % solution 0-50 mL, 0-50 mL, Intravenous, PRN, Burnard Leigh,  Shane Crutch, MD   docusate (COLACE) 50 MG/5ML liquid 100 mg, 100 mg, Oral, BID PRN, Maryjane Hurter, MD   docusate (COLACE) 50 MG/5ML liquid 100 mg, 100 mg, Per Tube, BID, Maryjane Hurter, MD, 100 mg at 05/31/22 2153   EPINEPHrine (ADRENALIN) 5 mg in NS 250 mL (0.02 mg/mL) premix infusion, 0.5-20 mcg/min, Intravenous, Titrated, Laqueta Jean, MD, Stopped at 05/31/22 2300   famotidine (PEPCID) tablet 20 mg, 20 mg, Per Tube, BID, Maryjane Hurter, MD, 20 mg at 05/31/22 2153   fentaNYL (SUBLIMAZE) injection 50 mcg, 50 mcg, Intravenous, Q15 min PRN, Maryjane Hurter, MD   fentaNYL (SUBLIMAZE) injection 50-200 mcg, 50-200 mcg, Intravenous, Q30 min PRN, Maryjane Hurter, MD   hydrocortisone sodium succinate (SOLU-CORTEF) 100 MG injection 50 mg, 50 mg, Intravenous, Q6H, Maryjane Hurter, MD, 50 mg at 23-Jun-2022 0329   insulin aspart (novoLOG) injection 0-15 Units, 0-15 Units, Subcutaneous, Q4H PRN, Laqueta Jean, MD, [phone]  Units at 06-11-22 0159   insulin regular, human (MYXREDLIN) 100 units/ 100 mL infusion, , Intravenous, Continuous, Laqueta Jean, MD, Last Rate: 14 mL/hr at 11-Jun-2022 0943, 14 Units/hr at Jun 11, 2022 0943   lactated ringers infusion, , Intravenous, Continuous, Maryjane Hurter, MD, Stopped at 06/11/2022 0944   magnesium sulfate IVPB 2 g 50 mL, 2 g, Intravenous, Once PRN, Verlee Monte Hortencia Conradi, MD   meropenem [facility]) 1 g in sodium chloride 0.9 % 100 mL IVPB, 1 g, Intravenous, Q8H, Maryjane Hurter, MD, Stopped at 2022/06/11 0140   norepinephrine (LEVOPHED) '4mg'$  in 267m (0.016 mg/mL) premix infusion, 0-40 mcg/min, Intravenous, Titrated, MMaryjane Hurter MD, Stopped at [phone]   ondansetron (ZOFRAN) injection 4 mg, 4 mg, Intravenous, Q6H PRN, MMaryjane Hurter MD   Oral care mouth rinse, 15 mL, Mouth Rinse, Q2H, Meier, NHortencia Conradi MD   Oral care mouth rinse, 15 mL, Mouth Rinse, PRN, MMaryjane Hurter MD   pantoprazole (PROTONIX) injection 40 mg, 40 mg,  Intravenous, Q12H, MMaryjane Hurter MD, 40 mg at 05/31/22 2222   perflutren lipid microspheres (DEFINITY) IV suspension, 1-10 mL, Intravenous, PRN, MMaryjane Hurter MD, 2 mL at [phone]   phenylephrine (NEO-SYNEPHRINE) '20mg'$ /NS 253mpremix infusion, 0-400 mcg/min, Intravenous, Titrated, DeLaqueta JeanMD, Stopped at [phone]   polyethylene glycol (MIRALAX / GLYCOLAX) packet 17 g, 17 g, Oral, Daily PRN, MeMaryjane HurterMD   polyethylene glycol (MIRALAX / GLYCOLAX) packet 17 g, 17 g, Per Tube, QHS, MeMaryjane HurterMD, 17 g at 05/31/22 2222   potassium chloride (KLOR-CON) packet 20 mEq, 20 mEq, Per Tube, BID, MeMaryjane HurterMD, 20 mEq at 05/31/22 2153   potassium chloride (KLOR-CON) packet 60 mEq, 60 mEq, Per Tube, BID, DeLaqueta JeanMD, 60 mEq at 05/31/22 2222   propofol (DIPRIVAN) 1000 MG/100ML infusion, 0-50 mcg/kg/min, Intravenous, Continuous, MeMaryjane HurterMD, Held at 05/31/22 1903   sodium bicarbonate 150 mEq in dextrose 5 % 1,150 mL infusion, , Intravenous, Continuous, MeMaryjane HurterMD, Last Rate: 100 mL/hr at 112023/11/29850, Infusion Verify at 112023/11/29850   sodium chloride flush (NS) 0.9 % injection 10-40 mL, 10-40 mL, Intracatheter, Q12H, Meier, NaHortencia ConradiMD   sodium chloride flush (NS) 0.9 % injection 10-40 mL, 10-40 mL, Intracatheter, PRN, MeMaryjane HurterMD   thiamine (VITAMIN B1) tablet 100 mg, 100 mg, Oral, Daily, GoSherwood GamblerMD, 100 mg at 05/31/22 0959   vasopressin (PITRESSIN) 20 Units in sodium chloride 0.9 % 100 mL infusion-*FOR SHOCK*, 0-0.03 Units/min, Intravenous, Continuous, LoCarmin MuskratMD, Stopped at [phone]-Administered Medications Ordered in Other Encounters:    filgrastim-aafi (NIVESTYM) 480 MCG/0.8ML injection, , , ,   acetaminophen  650 mg Oral Q4H   Or   acetaminophen (TYLENOL) oral liquid 160 mg/5 mL  650 mg Per Tube Q4H   Or   acetaminophen  650 mg Rectal Q4H   Chlorhexidine  Gluconate Cloth  6 each Topical Daily   docusate  100 mg Per Tube BID   famotidine  20 mg Per Tube BID   hydrocortisone sod succinate (SOLU-CORTEF) inj  50 mg Intravenous Q6H   mouth rinse  15 mL Mouth Rinse Q2H   pantoprazole (PROTONIX) IV  40 mg Intravenous Q12H   polyethylene glycol  17 g Per Tube QHS   potassium chloride  20 mEq Per Tube BID   potassium chloride  60 mEq Per Tube BID   sodium chloride  flush  10-40 mL Intracatheter Q12H   thiamine  100 mg Oral Daily    sodium chloride     sodium chloride Stopped (06-17-2022 0739)   dextrose 5% lactated ringers 125 mL/hr at 2022/06/17 0944   epinephrine Stopped (05/31/22 2300)   insulin 14 Units/hr (2022-06-17 0943)   lactated ringers Stopped (06-17-22 0944)   magnesium sulfate     meropenem (MERREM) IV Stopped (06-17-2022 0140)   norepinephrine (LEVOPHED) Adult infusion Stopped (2022/06/17 7209)   phenylephrine (NEO-SYNEPHRINE) Adult infusion Stopped (Jun 17, 2022 0048)   propofol (DIPRIVAN) infusion Stopped (05/31/22 1903)   sodium bicarbonate 150 mEq in dextrose 5 % 1,150 mL infusion 100 mL/hr at 06-17-22 0850   vasopressin Stopped (June 17, 2022 0643)    Physical Exam: Blood pressure (!) 124/106, pulse (!) 140, temperature (!) 95.9 F (35.5 C), resp. rate (!) 28, height '5\' 4"'$  (1.626 m), weight 98 kg, last menstrual period 10/26/2020, SpO2 98 %.    Intubated  Non responsive black female Lungs clear No murmur  Abdomen benign  No edema  Labs:   Lab Results  Component Value Date   WBC 13.2 (H) 2022/06/17   HGB 15.3 (H) 06/17/2022   HCT 45.6 06/17/22   MCV 89.1 06-17-22   PLT 185 17-Jun-2022    Recent Labs  Lab 06/17/2022 0315  NA 130*  K 3.6  CL 98  CO2 19*  BUN 17  CREATININE 1.07*  CALCIUM 8.0*  PROT 6.2*  BILITOT 1.0  ALKPHOS 54  ALT 831*  AST 1,028*  GLUCOSE 425*   Lab Results  Component Value Date   CKTOTAL 171 05/22/2022   No results found for: "CHOL" No results found for: "HDL" No results found for:  "LDLCALC" Lab Results  Component Value Date   TRIG 87 2022/06/17   No results found for: "CHOLHDL" No results found for: "LDLDIRECT"    Radiology: ECHOCARDIOGRAM COMPLETE  Result Date: 2022-06-17    ECHOCARDIOGRAM REPORT   Patient Name:   [name] Date of Exam: 06-17-2022 Medical Rec #:  [id number]   Height:       64.0 in Accession #:    [id number]  Weight:       216.0 lb Date of Birth:  [birth year]    BSA:          2.022 m Patient Age:    64 years    BP:           125/88 mmHg Patient Gender: F           HR:           138 bpm. Exam Location:  Inpatient Procedure: 2D Echo, Cardiac Doppler, Color Doppler and Intracardiac            Opacification Agent REPORT CONTAINS CRITICAL RESULT Indications:    R94.31 Abnormal EKG. Cardiac arrest.  History:        Patient has prior history of Echocardiogram examinations, most                 recent 03/28/2022. Cardiomyopathy, Abnormal ECG,                 Arrythmias:Tachycardia; Signs/Symptoms:Shortness of Breath and                 Dyspnea. Breast cancer.  Sonographer:    Roseanna Rainbow RDCS Referring Phys: [id number] Hortencia Conradi [facility]  Sonographer Comments: Technically difficult study due to poor echo windows and echo performed with patient supine and on artificial respirator. Image acquisition  challenging due to mastectomy. IMPRESSIONS  1. Left ventricular ejection fraction, by estimation, is 20 to 25%. The left ventricle has severely decreased function. The left ventricle demonstrates global hypokinesis. The left ventricular internal cavity size was severely dilated. Left ventricular diastolic parameters are indeterminate.  2. Right ventricular systolic function is normal. The right ventricular size is normal.  3. The mitral valve is normal in structure. No evidence of mitral valve regurgitation. No evidence of mitral stenosis.  4. The aortic valve is normal in structure. Aortic valve regurgitation is not visualized. No aortic stenosis is present.  5. The inferior vena cava  is dilated in size with >50% respiratory variability, suggesting right atrial pressure of 8 mmHg. FINDINGS  Left Ventricle: Left ventricular ejection fraction, by estimation, is 20 to 25%. The left ventricle has severely decreased function. The left ventricle demonstrates global hypokinesis. Definity contrast agent was given IV to delineate the left ventricular endocardial borders. The left ventricular internal cavity size was severely dilated. There is no left ventricular hypertrophy. Left ventricular diastolic parameters are indeterminate. Right Ventricle: The right ventricular size is normal. No increase in right ventricular wall thickness. Right ventricular systolic function is normal. Left Atrium: Left atrial size was normal in size. Right Atrium: Right atrial size was normal in size. Pericardium: There is no evidence of pericardial effusion. Mitral Valve: The mitral valve is normal in structure. No evidence of mitral valve regurgitation. No evidence of mitral valve stenosis. Tricuspid Valve: The tricuspid valve is normal in structure. Tricuspid valve regurgitation is not demonstrated. No evidence of tricuspid stenosis. Aortic Valve: The aortic valve is normal in structure. Aortic valve regurgitation is not visualized. No aortic stenosis is present. Pulmonic Valve: The pulmonic valve was normal in structure. Pulmonic valve regurgitation is not visualized. No evidence of pulmonic stenosis. Aorta: The aortic root is normal in size and structure. Venous: The inferior vena cava is dilated in size with greater than 50% respiratory variability, suggesting right atrial pressure of 8 mmHg. IAS/Shunts: No atrial level shunt detected by color flow Doppler.  LEFT VENTRICLE PLAX 2D LVIDd:         4.50 cm LVIDs:         4.30 cm LV PW:         1.10 cm LV IVS:        0.90 cm LVOT diam:     1.75 cm LV SV:         13 LV SV Index:   6 LVOT Area:     2.41 cm  LV Volumes (MOD) LV vol d, MOD A2C: 139.0 ml LV vol d, MOD A4C: 100.0  ml LV vol s, MOD A2C: 120.0 ml LV vol s, MOD A4C: 85.6 ml LV SV MOD A2C:     19.0 ml LV SV MOD A4C:     100.0 ml LV SV MOD BP:      16.7 ml RIGHT VENTRICLE            IVC RV S prime:     8.49 cm/s  IVC diam: 2.00 cm TAPSE (M-mode): 1.0 cm LEFT ATRIUM           Index       RIGHT ATRIUM          Index LA diam:      2.60 cm 1.29 cm/m  RA Area:     5.23 cm LA Vol (A2C): 10.2 ml 5.05 ml/m  RA Volume:   6.64 ml  3.28 ml/m LA  Vol (A4C): 5.4 ml  2.65 ml/m  AORTIC VALVE LVOT Vmax:   57.20 cm/s LVOT Vmean:  34.400 cm/s LVOT VTI:    0.054 m  AORTA Ao Root diam: 3.00 cm MITRAL VALVE MV Area (PHT): 6.96 cm    SHUNTS MV Decel Time: 109 msec    Systemic VTI:  0.05 m MV E velocity: 81.50 cm/s  Systemic Diam: 1.75 cm Jenkins Rouge MD Electronically signed by Jenkins Rouge MD Signature Date/Time: 2022-06-23/8:50:55 AM    Final    CT ANGIO HEAD NECK W WO CM  Result Date: 23-Jun-2022 CLINICAL DATA:  Abnormal head CT. EXAM: CT ANGIOGRAPHY HEAD AND NECK TECHNIQUE: Multidetector CT imaging of the head and neck was performed using the standard protocol during bolus administration of intravenous contrast. Multiplanar CT image reconstructions and MIPs were obtained to evaluate the vascular anatomy. Carotid stenosis measurements (when applicable) are obtained utilizing NASCET criteria, using the distal internal carotid diameter as the denominator. RADIATION DOSE REDUCTION: This exam was performed according to the departmental dose-optimization program which includes automated exposure control, adjustment of the mA and/or kV according to patient size and/or use of iterative reconstruction technique. CONTRAST:  53m OMNIPAQUE IOHEXOL 350 MG/ML SOLN COMPARISON:  Head CT from yesterday FINDINGS: CT HEAD FINDINGS Brain: Worsening of intracranial hypertension with diffuse effacement of subarachnoid spaces and slit-like lateral ventricles. The third and fourth ventricles are not perceptible. Patchy superimposed areas of cytotoxic edema along  the cerebral cortex and bilateral cerebellum from superimposed more dense infarcts. Vascular: See below Skull: Negative Sinuses/Orbits: Negative Review of the MIP images confirms the above findings CTA NECK FINDINGS Aortic arch: 4 vessel arch. Right carotid system: No flow seen in the ICA. There is appropriate timing based on common carotid and ECA flow Left carotid system: No flow seen in the ICA Vertebral arteries: No flow seen in the vertebral arteries. Skeleton: No acute finding Other neck: Unremarkable hardware. Upper chest: Airspace disease in the dependent lungs, partially covered and likely from aspiration. Review of the MIP images confirms the above findings CTA HEAD FINDINGS Anterior circulation: No arterial flow seen in the head. Only faint enhancement is seen on 5 minute delays. Posterior circulation:  No arterial flow seen in the head, as above. Venous sinuses: No venous enhancement on the arterial phase. In the delayed phase there is potential for thrombosis in the superior sagittal sinus. The dural sinuses are diffusely effaced from severe elevated intracranial pressure. Anatomic variants: None significant Review of the MIP images confirms the above findings IMPRESSION: 1. Worsening of severe elevated intracranial pressure with diffuse brain swelling and patchy infarcts. There is downward central herniation. 2. No intracranial perfusion in the arterial phase. Electronically Signed   By: JJorje GuildM.D.   On: 112/11/202306:25   CT HEAD W CONTRAST (5MM)  Result Date: 1Dec 11, 2023CLINICAL DATA:  Abnormal head CT. EXAM: CT ANGIOGRAPHY HEAD AND NECK TECHNIQUE: Multidetector CT imaging of the head and neck was performed using the standard protocol during bolus administration of intravenous contrast. Multiplanar CT image reconstructions and MIPs were obtained to evaluate the vascular anatomy. Carotid stenosis measurements (when applicable) are obtained utilizing NASCET criteria, using the distal  internal carotid diameter as the denominator. RADIATION DOSE REDUCTION: This exam was performed according to the departmental dose-optimization program which includes automated exposure control, adjustment of the mA and/or kV according to patient size and/or use of iterative reconstruction technique. CONTRAST:  754mOMNIPAQUE IOHEXOL 350 MG/ML SOLN COMPARISON:  Head CT from yesterday FINDINGS:  CT HEAD FINDINGS Brain: Worsening of intracranial hypertension with diffuse effacement of subarachnoid spaces and slit-like lateral ventricles. The third and fourth ventricles are not perceptible. Patchy superimposed areas of cytotoxic edema along the cerebral cortex and bilateral cerebellum from superimposed more dense infarcts. Vascular: See below Skull: Negative Sinuses/Orbits: Negative Review of the MIP images confirms the above findings CTA NECK FINDINGS Aortic arch: 4 vessel arch. Right carotid system: No flow seen in the ICA. There is appropriate timing based on common carotid and ECA flow Left carotid system: No flow seen in the ICA Vertebral arteries: No flow seen in the vertebral arteries. Skeleton: No acute finding Other neck: Unremarkable hardware. Upper chest: Airspace disease in the dependent lungs, partially covered and likely from aspiration. Review of the MIP images confirms the above findings CTA HEAD FINDINGS Anterior circulation: No arterial flow seen in the head. Only faint enhancement is seen on 5 minute delays. Posterior circulation:  No arterial flow seen in the head, as above. Venous sinuses: No venous enhancement on the arterial phase. In the delayed phase there is potential for thrombosis in the superior sagittal sinus. The dural sinuses are diffusely effaced from severe elevated intracranial pressure. Anatomic variants: None significant Review of the MIP images confirms the above findings IMPRESSION: 1. Worsening of severe elevated intracranial pressure with diffuse brain swelling and patchy  infarcts. There is downward central herniation. 2. No intracranial perfusion in the arterial phase. Electronically Signed   By: Jorje Guild M.D.   On: 16-Jun-2022 06:25   CT VENOGRAM HEAD  Result Date: 06-16-2022 CLINICAL DATA:  Infarcts diffusely by head CT.  Cardiac arrest EXAM: CT VENOGRAM HEAD TECHNIQUE: Venographic phase images of the brain were obtained following the administration of intravenous contrast. Multiplanar reformats and maximum intensity projections were generated. RADIATION DOSE REDUCTION: This exam was performed according to the departmental dose-optimization program which includes automated exposure control, adjustment of the mA and/or kV according to patient size and/or use of iterative reconstruction technique. CONTRAST:  56m OMNIPAQUE IOHEXOL 350 MG/ML SOLN COMPARISON:  CT from yesterday FINDINGS: Severe intracranial swelling with complete effacement of subarachnoid spaces and slit-like lateral ventricles. There is downward central herniation of the brain with superimposed patchy bilateral cerebral infarcts. Diffuse effacement of the dural sinuses, thrombosis may have already occurred at the superior sagittal sinus. IMPRESSION: Worsening of severe brain edema with patchy diffuse infarcts and downward central herniation of the brain. Very poor intracranial perfusion, thrombosis may have already occurred at the superior sagittal sinus. Electronically Signed   By: JJorje GuildM.D.   On: 1Dec 04, 202306:17   CT CHEST ABDOMEN PELVIS WO CONTRAST  Result Date: 05/31/2022 CLINICAL DATA:  Post code, acute aortic syndrome suspected. History of breast cancer. EXAM: CT CHEST, ABDOMEN AND PELVIS WITHOUT CONTRAST TECHNIQUE: Multidetector CT imaging of the chest, abdomen and pelvis was performed following the standard protocol without IV contrast. RADIATION DOSE REDUCTION: This exam was performed according to the departmental dose-optimization program which includes automated exposure  control, adjustment of the mA and/or kV according to patient size and/or use of iterative reconstruction technique. COMPARISON:  CT angiogram chest 09/09/2021. CT abdomen and pelvis 05/22/2022. FINDINGS: CT CHEST FINDINGS Cardiovascular: No significant vascular findings. Normal heart size. No pericardial effusion. Mediastinum/Nodes: No enlarged mediastinal, hilar, or axillary lymph nodes. Thyroid gland, trachea, and esophagus demonstrate no significant findings. Enteric tube is seen throughout the esophagus. Lungs/Pleura: There is bilateral lower lobe consolidation. There are patchy and ill-defined nodular ground-glass opacities in the bilateral lower  lobes, right middle lobe and bilateral upper lobes. There is also some airspace consolidation in the posterior right upper lobe. The trachea and mainstem bronchi are patent. There is some cutoff of segmental and subsegmental branches of the right lower lobe. Endotracheal tube tip is 2 cm above the carina. There is likely a small right pleural effusion. There is no pneumothorax. Musculoskeletal: Mildly displaced anterior right third and sixth rib fractures. Nondisplaced anterior right fourth, fifth, seventh rib fractures. There surgical clips along the right chest wall. Bilateral breast implants/spacers are present. Right axillary surgical clips are present. CT ABDOMEN PELVIS FINDINGS Hepatobiliary: No focal liver abnormality is seen. Status post cholecystectomy. No biliary dilatation. Pancreas: There is questionable mild inflammatory stranding surrounding the entire pancreas. Spleen: Normal in size without focal abnormality. Adrenals/Urinary Tract: Adrenal glands are unremarkable. Kidneys are normal, without renal calculi, focal lesion, or hydronephrosis. Bladder is decompressed by Foley catheter. Stomach/Bowel: Stomach is within normal limits. Appendix appears normal. No evidence of bowel wall thickening, distention, or inflammatory changes. Colonic diverticulosis  present. Enteric tube tip in the proximal body of the stomach. Vascular/Lymphatic: No significant vascular findings are present. No enlarged abdominal or pelvic lymph nodes. Reproductive: Uterus and bilateral adnexa are unremarkable. Other: No abdominal wall hernia or abnormality. There is trace free fluid in the left upper quadrant. Musculoskeletal: No acute or significant osseous findings. IMPRESSION: 1. Extensive bilateral lower lobe consolidation with patchy and ill-defined nodular ground-glass opacities throughout the remainder of the lungs. Findings are suspicious for multifocal pneumonia and/or aspiration. 2. Small right pleural effusion. 3. There is some cutoff of segmental and subsegmental branches of the right lower lobe. Findings may be related to mucous plugging or aspiration. 4. Questionable mild inflammatory stranding surrounding the pancreas. Correlate clinically for pancreatitis. 5. Trace free fluid in the left upper quadrant. 6. Acute right third through seventh rib fractures. No pneumothorax. Electronically Signed   By: Ronney Asters M.D.   On: 05/31/2022 19:33   CT Head Wo Contrast  Result Date: 05/31/2022 CLINICAL DATA:  Status post cardiac arrest. EXAM: CT HEAD WITHOUT CONTRAST TECHNIQUE: Contiguous axial images were obtained from the base of the skull through the vertex without intravenous contrast. RADIATION DOSE REDUCTION: This exam was performed according to the departmental dose-optimization program which includes automated exposure control, adjustment of the mA and/or kV according to patient size and/or use of iterative reconstruction technique. 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 There is likely also an infarct in the anterior left temporal lobe (series 104, image 32). There is also diffuse sulcal effacement, crowding of the foramen magnum, and effacement of the basal  cisterns which is worrisome for cerebral edema. Vascular: No hyperdense vessel or unexpected calcification. Skull: Normal. Negative for fracture or focal lesion. Sinuses/Orbits: Air-fluid level in the right11 maxillary sinus. Other: None. 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 There is also diffuse sulcal effacement, crowding of the foramen magnum, and effacement of the basal cisterns which is worrisome for cerebral edema and hypoxic ischemic injury. Recommend brain MRI for further evaluation Electronically Signed   By: Marin Roberts M.D.   On: 05/31/2022 18:23   DG Abd 1 View  Result Date: 05/31/2022 CLINICAL DATA:  OG tube placement. EXAM: ABDOMEN - 1 VIEW COMPARISON:  One view chest x-ray 05/31/2022 at 2:56 p.m. FINDINGS: Side port  of the OG tube is in the stomach. The stomach is decompressed. Bowel gas pattern is otherwise unremarkable. IMPRESSION: Side port of the OG tube is in the stomach. Electronically Signed   By: San Morelle M.D.   On: 05/31/2022 15:45   DG Chest Port 1 View  Result Date: 05/31/2022 CLINICAL DATA:  tube EXAM: PORTABLE CHEST 1 VIEW COMPARISON:  May 21, 2022 FINDINGS: Evaluation is limited by positioning. The cardiomediastinal silhouette is unchanged in contour.ETT tip terminates 3 cm above the carina. No pleural effusion. No pneumothorax. New hazy LEFT greater than RIGHT perihilar predominant opacities. Gaseous distension of the stomach. Surgical clips project over the RIGHT thorax. IMPRESSION: 1. ETT tip terminates 3 cm above the carina. 2. New LEFT greater than RIGHT perihilar predominant opacities. Differential considerations include pulmonary edema, aspiration or infection. Electronically Signed   By: Valentino Saxon M.D.   On: 05/31/2022 15:24   CT CHEST W CONTRAST  Result Date: 05/24/2022 CLINICAL DATA:  Occult malignancy malignancy screen, prior hx of breast cancer in  remission but has acute onset confusion, aphasia and motor apraxia. High concern for a paraneoplastic process. EXAM: CT CHEST WITH CONTRAST TECHNIQUE: Multidetector CT imaging of the chest was performed during intravenous contrast administration. RADIATION DOSE REDUCTION: This exam was performed according to the departmental dose-optimization program which includes automated exposure control, adjustment of the mA and/or kV according to patient size and/or use of iterative reconstruction technique. CONTRAST:  28m OMNIPAQUE IOHEXOL 350 MG/ML SOLN COMPARISON:  05/21/2022, 05/22/2022, 09/09/2021 FINDINGS: Cardiovascular: The heart is unremarkable without pericardial effusion. No evidence of thoracic aortic aneurysm or dissection. Mediastinum/Nodes: No enlarged mediastinal, hilar, or axillary lymph nodes. Thyroid gland, trachea, and esophagus demonstrate no significant findings. Lungs/Pleura: Patchy left lower lobe airspace disease is again identified within the posterior costophrenic angle, most compatible with pneumonia. No effusion or pneumothorax. The central airways are patent. Upper Abdomen: No acute abnormality. Musculoskeletal: Postsurgical changes from bilateral mastectomies with breast prostheses in place. There are no acute or destructive bony lesions. Reconstructed images demonstrate no additional findings. IMPRESSION: 1. Patchy left lower lobe airspace disease, unchanged since previous abdominal CT, most consistent with focal pneumonia. Followup PA and lateral chest X-ray is recommended in 3-4 weeks following trial of antibiotic therapy to ensure resolution and exclude underlying malignancy. 2. Otherwise no acute intrathoracic process. 3. Postsurgical changes from bilateral mastectomies and breast reconstructive surgery. Electronically Signed   By: MRanda NgoM.D.   On: 05/24/2022 21:19   DG FL GUIDED LUMBAR PUNCTURE  Result Date: 05/23/2022 CLINICAL DATA:  46year old female history of breast  cancer. [facility]. Patient was discharged and readmitted with confusion and aphasia. Team is requesting lumbar puncture for further evaluation. EXAM: DIAGNOSTIC LUMBAR PUNCTURE UNDER FLUOROSCOPIC GUIDANCE COMPARISON:  None Available. FLUOROSCOPY: Radiation Exposure Index (as provided by the fluoroscopic device): 11 mGy Kerma PROCEDURE: Informed consent was obtained from the patient prior to the procedure, including potential complications of headache, allergy, and pain. With the patient prone, the lower back was prepped with Betadine. 1% Lidocaine was used for local anesthesia. Lumbar puncture was performed at the L4-5 level using a 20 gauge 5 inch needle with return of clear CSF with an opening pressure of 15 cm water. 9 mL of CSF were obtained for laboratory studies. The patient tolerated the procedure well and there were no apparent complications. This exam was performed by JRushie Nyhan NP, and was supervised and interpreted by AZenia Resides  Jeralyn Ruths, MD. IMPRESSION: Technically successful fluoroscopically guided lumbar puncture. Electronically Signed   By: Logan Bores M.D.   On: 05/23/2022 15:54   Overnight EEG with video  Result Date: 05/23/2022 Lora Havens, MD     05/24/2022  9:45 AM Patient Name: Cason Dabney MRN: [id number] Epilepsy Attending: Lora Havens Referring Physician/Provider: Lorenza Chick, MD Duration: 05/23/2022 0453 to 05/24/2022 0453 Patient history: 46 year old female with acute onset bilateral upper and lower extremity weakness, confusion, hallucinations.  EEG to evaluate for seizure. Level of alertness: Awake, asleep AEDs during EEG study: None Technical aspects: This EEG study was done with scalp electrodes positioned according to the 10-20 International system of electrode placement. Electrical activity was reviewed with band pass filter of 1-'70Hz'$ , sensitivity of 7 uV/mm, display speed of 78m/sec with a '60Hz'$  notched  filter applied as appropriate. EEG data were recorded continuously and digitally stored.  Video monitoring was available and reviewed as appropriate. Description: The posterior dominant rhythm consists of 8 Hz activity of moderate voltage (25-35 uV) seen predominantly in posterior head regions, symmetric and reactive to eye opening and eye closing. Sleep was characterized by vertex waves, sleep spindles (12 to 14 Hz), maximal frontocentral region. EEG showed continuous generalized polymorphic 3 to 6 Hz theta-delta slowing. Hyperventilation and photic stimulation were not performed.   ABNORMALITY - Continuous slow, generalized IMPRESSION: This study is suggestive of moderate diffuse encephalopathy, nonspecific etiology. No seizures or epileptiform discharges were seen throughout the recording. PLora Havens  CT Head Wo Contrast  Result Date: 05/22/2022 CLINICAL DATA:  Dizziness, persistent/recurrent, cardiac or vascular cause suspected Hospital discharge today. EXAM: CT HEAD WITHOUT CONTRAST TECHNIQUE: Contiguous axial images were obtained from the base of the skull through the vertex without intravenous contrast. RADIATION DOSE REDUCTION: This exam was performed according to the departmental dose-optimization program which includes automated exposure control, adjustment of the mA and/or kV according to patient size and/or use of iterative reconstruction technique. COMPARISON:  Brain MRI 05/24/2021 FINDINGS: Brain: No evidence of acute infarction, hemorrhage, hydrocephalus, extra-axial collection or mass lesion/mass effect. Vascular: No hyperdense vessel or unexpected calcification. Skull: Normal. Negative for fracture or focal lesion. Sinuses/Orbits: Minimal mucosal thickening of scattered ethmoid air cells. No mastoid effusion. Unremarkable orbits. Other: None. IMPRESSION: No acute intracranial abnormality. Electronically Signed   By: MKeith RakeM.D.   On: 05/22/2022 23:18   CT ABDOMEN PELVIS W  CONTRAST  Result Date: 05/22/2022 CLINICAL DATA:  Abdominal pain, nausea/vomiting EXAM: CT ABDOMEN AND PELVIS WITH CONTRAST TECHNIQUE: Multidetector CT imaging of the abdomen and pelvis was performed using the standard protocol following bolus administration of intravenous contrast. RADIATION DOSE REDUCTION: This exam was performed according to the departmental dose-optimization program which includes automated exposure control, adjustment of the mA and/or kV according to patient size and/or use of iterative reconstruction technique. CONTRAST:  1063mOMNIPAQUE IOHEXOL 300 MG/ML  SOLN COMPARISON:  11/14/2020 FINDINGS: Lower chest: Focal patchy opacity the left lung base, suspicious for pneumonia. Hepatobiliary: Liver is within normal limits. Status post cholecystectomy. No intrahepatic or extrahepatic ductal dilatation. Pancreas: Within normal limits. Spleen: Within normal limits Adrenals/Urinary Tract: Adrenal glands are within normal limits. Kidneys are normal limits.  No hydronephrosis. Bladder is within normal limits. Stomach/Bowel: Stomach is within normal limits. No evidence of bowel obstruction. Normal appendix (series 2/image 1). No colonic wall thickening or inflammatory changes. Vascular/Lymphatic: No evidence of abdominal aortic aneurysm. No suspicious abdominopelvic lymphadenopathy. Reproductive: Uterus is within normal limits. No adnexal masses. Other: No  abdominopelvic ascites. Musculoskeletal: Visualized osseous structures are within normal limits. IMPRESSION: Focal patchy opacity at the left lung base, suspicious for pneumonia. Otherwise negative CT abdomen/pelvis. Electronically Signed   By: Julian Hy M.D.   On: 05/22/2022 02:58   DG Chest Port 1 View  Result Date: 05/21/2022 CLINICAL DATA:  Weakness EXAM: PORTABLE CHEST 1 VIEW COMPARISON:  09/09/2021 FINDINGS: Lungs are clear.  No pleural effusion or pneumothorax. The heart is normal in size. IMPRESSION: No evidence of acute  cardiopulmonary disease. Electronically Signed   By: Julian Hy M.D.   On: 05/21/2022 21:40    EKG: See HPI   ASSESSMENT AND PLAN:   Cardiac Arrest:  PEA no shockable rhythm. ECG with transient global ST depression Resolved TTE with EF 20-25% global hypokinesis Troponin peak  2130.  Suspect ECG changes more from acute CNS event She is not a candidate for cath lab given cerebral edema/herniation and Hb initially 5.3 improved now with transfusion. Hemodynamics and rhythm stable on vent currently Given advanced breast cancer and CNS situation she will likely be comfort care . Cardiology has no other recommendations   Signed: Jenkins Rouge 06-Jun-2022, 9:45 AM

## 2022-06-13 NOTE — Progress Notes (Signed)
Patient's family made the decision to transition to comfort care at this time. Patient was extubated by RT, per orders, at 1225. Patient placed on 2L [place] for comfort. This RN will continue to provide support to family and monitor patient for signs of pain and discomfort.

## 2022-06-13 NOTE — Progress Notes (Signed)
This chaplain responded to unit page for spiritual care for family and staff after Pt. death.   The chaplain was updated by RN-Jamie at time of arrival. After a bedside presence with the Pt., the chaplain joined the Pt. family in the waiting area. The Pt. mother-Amber White is joined by clergy and other family members as they grieve together. The chaplain understands Amber White is grateful for the medical team's care for the Pt. beyond the physical.   The chaplain introduced herself and listened reflectively as the Pt. RN-Hope shared her story with the Pt. and family. The chaplain offered F/U spiritual care to the staff and ongoing support through [facility] and [facility].  Chaplain Amber White [phone]

## 2022-06-13 NOTE — Progress Notes (Signed)
Following extubation for comfort care, patient expired peacefully  at 1242. Patient pronounced by Ludlow Sink RN and Justice Britain RN. Dr Verlee Monte notified by this RN. All patient belongings sent home with patient's mother, [place] Blas. This RN will continue to offer support to patient's family and loved ones.

## 2022-06-13 NOTE — Death Summary Note (Signed)
DEATH SUMMARY   Patient Details  Name: Amber White MRN: [id number] DOB: 1976-05-07  Admission/Discharge Information   Admit Date:  2022/06/27  Date of Death: Date of Death: June 29, 2022  Time of Death: Time of Death: October 21, 1240  Length of Stay: 1  Referring Physician: [name], MD   Reason(s) for Hospitalization  Cardiac arrest  Diagnoses  Preliminary cause of death:  Secondary Diagnoses (including complications and co-morbidities):  Principal Problem:   Depression Active Problems:   Cardiac arrest [facility])   Brief Hospital Course (including significant findings, care, treatment, and services provided and events leading to death)  Amber White is a 46 y.o. 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 Surprisingly her course during evaluation in TCU was complicated by unwitnessed cardiac arrest, found pulseless after likely massive aspiration. ROSC achieved after 18-25 minutes. Potentially related to CNS infarctions from either embolic strokes or related to central venous thrombosis. Her course post arrest was complicated by profound hemodynamic instability and eventually herniation. She was pronounced dead at 12:42 after family elected to proceed with comfort measures only.      Pertinent Labs and Studies  Significant Diagnostic Studies ECHOCARDIOGRAM COMPLETE  Result Date: 06-29-2022    ECHOCARDIOGRAM REPORT   Patient Name:   Amber White Date of Exam: 29-Jun-2022 Medical Rec #:  [id number]   Height:       64.0 in Accession #:    [id number]  Weight:       216.0 lb Date of Birth:  [birth year]    BSA:          2.022 m Patient Age:     4 years    BP:           125/88 mmHg Patient Gender: F           HR:           138 bpm. Exam Location:  Inpatient Procedure: 2D Echo, Cardiac Doppler, Color Doppler and Intracardiac            Opacification Agent REPORT CONTAINS CRITICAL RESULT Indications:    R94.31 Abnormal EKG. Cardiac arrest.  History:        Patient has prior history of Echocardiogram examinations, most                 recent 03/28/2022. Cardiomyopathy, Abnormal ECG,                 Arrythmias:Tachycardia; Signs/Symptoms:Shortness of Breath and                 Dyspnea. Breast cancer.  Sonographer:    Roseanna Rainbow RDCS Referring Phys: [id number] Hortencia Conradi [facility]  Sonographer Comments: Technically difficult study due to poor echo windows and echo performed with patient supine and on artificial respirator. Image acquisition challenging due to mastectomy. IMPRESSIONS  1. Left ventricular ejection fraction, by estimation, is 20 to 25%. The left ventricle has severely decreased function. The left ventricle demonstrates global hypokinesis. The left ventricular internal cavity size was severely dilated. Left ventricular diastolic parameters are indeterminate.  2. Right ventricular systolic function is normal. The right ventricular size is normal.  3. The mitral valve is normal in structure. No evidence of  mitral valve regurgitation. No evidence of mitral stenosis.  4. The aortic valve is normal in structure. Aortic valve regurgitation is not visualized. No aortic stenosis is present.  5. The inferior vena cava is dilated in size with >50% respiratory variability, suggesting right atrial pressure of 8 mmHg. FINDINGS  Left Ventricle: Left ventricular ejection fraction, by estimation, is 20 to 25%. The left ventricle has severely decreased function. The left ventricle demonstrates global hypokinesis. Definity contrast agent was given IV to delineate the left ventricular endocardial borders. The left ventricular internal cavity size was severely dilated. There  is no left ventricular hypertrophy. Left ventricular diastolic parameters are indeterminate. Right Ventricle: The right ventricular size is normal. No increase in right ventricular wall thickness. Right ventricular systolic function is normal. Left Atrium: Left atrial size was normal in size. Right Atrium: Right atrial size was normal in size. Pericardium: There is no evidence of pericardial effusion. Mitral Valve: The mitral valve is normal in structure. No evidence of mitral valve regurgitation. No evidence of mitral valve stenosis. Tricuspid Valve: The tricuspid valve is normal in structure. Tricuspid valve regurgitation is not demonstrated. No evidence of tricuspid stenosis. Aortic Valve: The aortic valve is normal in structure. Aortic valve regurgitation is not visualized. No aortic stenosis is present. Pulmonic Valve: The pulmonic valve was normal in structure. Pulmonic valve regurgitation is not visualized. No evidence of pulmonic stenosis. Aorta: The aortic root is normal in size and structure. Venous: The inferior vena cava is dilated in size with greater than 50% respiratory variability, suggesting right atrial pressure of 8 mmHg. IAS/Shunts: No atrial level shunt detected by color flow Doppler.  LEFT VENTRICLE PLAX 2D LVIDd:         4.50 cm LVIDs:         4.30 cm LV PW:         1.10 cm LV IVS:        0.90 cm LVOT diam:     1.75 cm LV SV:         13 LV SV Index:   6 LVOT Area:     2.41 cm  LV Volumes (MOD) LV vol d, MOD A2C: 139.0 ml LV vol d, MOD A4C: 100.0 ml LV vol s, MOD A2C: 120.0 ml LV vol s, MOD A4C: 85.6 ml LV SV MOD A2C:     19.0 ml LV SV MOD A4C:     100.0 ml LV SV MOD BP:      16.7 ml RIGHT VENTRICLE            IVC RV S prime:     8.49 cm/s  IVC diam: 2.00 cm TAPSE (M-mode): 1.0 cm LEFT ATRIUM           Index       RIGHT ATRIUM          Index LA diam:      2.60 cm 1.29 cm/m  RA Area:     5.23 cm LA Vol (A2C): 10.2 ml 5.05 ml/m  RA Volume:   6.64 ml  3.28 ml/m LA Vol (A4C): 5.4 ml  2.65  ml/m  AORTIC VALVE LVOT Vmax:   57.20 cm/s LVOT Vmean:  34.400 cm/s LVOT VTI:    0.054 m  AORTA Ao Root diam: 3.00 cm MITRAL VALVE MV Area (PHT): 6.96 cm    SHUNTS MV Decel Time: 109 msec    Systemic VTI:  0.05 m MV E velocity: 81.50 cm/s  Systemic Diam: 1.75 cm Amber Rouge  MD Electronically signed by Amber Rouge MD Signature Date/Time: 2022/06/27/8:50:55 AM    Final    CT ANGIO HEAD NECK W WO CM  Result Date: 06-27-22 CLINICAL DATA:  Abnormal head CT. EXAM: CT ANGIOGRAPHY HEAD AND NECK TECHNIQUE: Multidetector CT imaging of the head and neck was performed using the standard protocol during bolus administration of intravenous contrast. Multiplanar CT image reconstructions and MIPs were obtained to evaluate the vascular anatomy. Carotid stenosis measurements (when applicable) are obtained utilizing NASCET criteria, using the distal internal carotid diameter as the denominator. RADIATION DOSE REDUCTION: This exam was performed according to the departmental dose-optimization program which includes automated exposure control, adjustment of the mA and/or kV according to patient size and/or use of iterative reconstruction technique. CONTRAST:  11m OMNIPAQUE IOHEXOL 350 MG/ML SOLN COMPARISON:  Head CT from yesterday FINDINGS: CT HEAD FINDINGS Brain: Worsening of intracranial hypertension with diffuse effacement of subarachnoid spaces and slit-like lateral ventricles. The third and fourth ventricles are not perceptible. Patchy superimposed areas of cytotoxic edema along the cerebral cortex and bilateral cerebellum from superimposed more dense infarcts. Vascular: See below Skull: Negative Sinuses/Orbits: Negative Review of the MIP images confirms the above findings CTA NECK FINDINGS Aortic arch: 4 vessel arch. Right carotid system: No flow seen in the ICA. There is appropriate timing based on common carotid and ECA flow Left carotid system: No flow seen in the ICA Vertebral arteries: No flow seen in the vertebral  arteries. Skeleton: No acute finding Other neck: Unremarkable hardware. Upper chest: Airspace disease in the dependent lungs, partially covered and likely from aspiration. Review of the MIP images confirms the above findings CTA HEAD FINDINGS Anterior circulation: No arterial flow seen in the head. Only faint enhancement is seen on 5 minute delays. Posterior circulation:  No arterial flow seen in the head, as above. Venous sinuses: No venous enhancement on the arterial phase. In the delayed phase there is potential for thrombosis in the superior sagittal sinus. The dural sinuses are diffusely effaced from severe elevated intracranial pressure. Anatomic variants: None significant Review of the MIP images confirms the above findings IMPRESSION: 1. Worsening of severe elevated intracranial pressure with diffuse brain swelling and patchy infarcts. There is downward central herniation. 2. No intracranial perfusion in the arterial phase. Electronically Signed   By: JJorje GuildM.D.   On: 1December 15, 202306:25   CT HEAD W CONTRAST (5MM)  Result Date: 112-15-2023CLINICAL DATA:  Abnormal head CT. EXAM: CT ANGIOGRAPHY HEAD AND NECK TECHNIQUE: Multidetector CT imaging of the head and neck was performed using the standard protocol during bolus administration of intravenous contrast. Multiplanar CT image reconstructions and MIPs were obtained to evaluate the vascular anatomy. Carotid stenosis measurements (when applicable) are obtained utilizing NASCET criteria, using the distal internal carotid diameter as the denominator. RADIATION DOSE REDUCTION: This exam was performed according to the departmental dose-optimization program which includes automated exposure control, adjustment of the mA and/or kV according to patient size and/or use of iterative reconstruction technique. CONTRAST:  793mOMNIPAQUE IOHEXOL 350 MG/ML SOLN COMPARISON:  Head CT from yesterday FINDINGS: CT HEAD FINDINGS Brain: Worsening of intracranial  hypertension with diffuse effacement of subarachnoid spaces and slit-like lateral ventricles. The third and fourth ventricles are not perceptible. Patchy superimposed areas of cytotoxic edema along the cerebral cortex and bilateral cerebellum from superimposed more dense infarcts. Vascular: See below Skull: Negative Sinuses/Orbits: Negative Review of the MIP images confirms the above findings CTA NECK FINDINGS Aortic arch: 4 vessel arch. Right carotid system:  No flow seen in the ICA. There is appropriate timing based on common carotid and ECA flow Left carotid system: No flow seen in the ICA Vertebral arteries: No flow seen in the vertebral arteries. Skeleton: No acute finding Other neck: Unremarkable hardware. Upper chest: Airspace disease in the dependent lungs, partially covered and likely from aspiration. Review of the MIP images confirms the above findings CTA HEAD FINDINGS Anterior circulation: No arterial flow seen in the head. Only faint enhancement is seen on 5 minute delays. Posterior circulation:  No arterial flow seen in the head, as above. Venous sinuses: No venous enhancement on the arterial phase. In the delayed phase there is potential for thrombosis in the superior sagittal sinus. The dural sinuses are diffusely effaced from severe elevated intracranial pressure. Anatomic variants: None significant Review of the MIP images confirms the above findings IMPRESSION: 1. Worsening of severe elevated intracranial pressure with diffuse brain swelling and patchy infarcts. There is downward central herniation. 2. No intracranial perfusion in the arterial phase. Electronically Signed   By: Jorje Guild M.D.   On: 15-Jun-2022 06:25   CT VENOGRAM HEAD  Result Date: 06-15-2022 CLINICAL DATA:  Infarcts diffusely by head CT.  Cardiac arrest EXAM: CT VENOGRAM HEAD TECHNIQUE: Venographic phase images of the brain were obtained following the administration of intravenous contrast. Multiplanar reformats and  maximum intensity projections were generated. RADIATION DOSE REDUCTION: This exam was performed according to the departmental dose-optimization program which includes automated exposure control, adjustment of the mA and/or kV according to patient size and/or use of iterative reconstruction technique. CONTRAST:  46m OMNIPAQUE IOHEXOL 350 MG/ML SOLN COMPARISON:  CT from yesterday FINDINGS: Severe intracranial swelling with complete effacement of subarachnoid spaces and slit-like lateral ventricles. There is downward central herniation of the brain with superimposed patchy bilateral cerebral infarcts. Diffuse effacement of the dural sinuses, thrombosis may have already occurred at the superior sagittal sinus. IMPRESSION: Worsening of severe brain edema with patchy diffuse infarcts and downward central herniation of the brain. Very poor intracranial perfusion, thrombosis may have already occurred at the superior sagittal sinus. Electronically Signed   By: JJorje GuildM.D.   On: 12023/06/305:17   CT CHEST ABDOMEN PELVIS WO CONTRAST  Result Date: 05/31/2022 CLINICAL DATA:  Post code, acute aortic syndrome suspected. History of breast cancer. EXAM: CT CHEST, ABDOMEN AND PELVIS WITHOUT CONTRAST TECHNIQUE: Multidetector CT imaging of the chest, abdomen and pelvis was performed following the standard protocol without IV contrast. RADIATION DOSE REDUCTION: This exam was performed according to the departmental dose-optimization program which includes automated exposure control, adjustment of the mA and/or kV according to patient size and/or use of iterative reconstruction technique. COMPARISON:  CT angiogram chest 09/09/2021. CT abdomen and pelvis 05/22/2022. FINDINGS: CT CHEST FINDINGS Cardiovascular: No significant vascular findings. Normal heart size. No pericardial effusion. Mediastinum/Nodes: No enlarged mediastinal, hilar, or axillary lymph nodes. Thyroid gland, trachea, and esophagus demonstrate no significant  findings. Enteric tube is seen throughout the esophagus. Lungs/Pleura: There is bilateral lower lobe consolidation. There are patchy and ill-defined nodular ground-glass opacities in the bilateral lower lobes, right middle lobe and bilateral upper lobes. There is also some airspace consolidation in the posterior right upper lobe. The trachea and mainstem bronchi are patent. There is some cutoff of segmental and subsegmental branches of the right lower lobe. Endotracheal tube tip is 2 cm above the carina. There is likely a small right pleural effusion. There is no pneumothorax. Musculoskeletal: Mildly displaced anterior right third and sixth rib  fractures. Nondisplaced anterior right fourth, fifth, seventh rib fractures. There surgical clips along the right chest wall. Bilateral breast implants/spacers are present. Right axillary surgical clips are present. CT ABDOMEN PELVIS FINDINGS Hepatobiliary: No focal liver abnormality is seen. Status post cholecystectomy. No biliary dilatation. Pancreas: There is questionable mild inflammatory stranding surrounding the entire pancreas. Spleen: Normal in size without focal abnormality. Adrenals/Urinary Tract: Adrenal glands are unremarkable. Kidneys are normal, without renal calculi, focal lesion, or hydronephrosis. Bladder is decompressed by Foley catheter. Stomach/Bowel: Stomach is within normal limits. Appendix appears normal. No evidence of bowel wall thickening, distention, or inflammatory changes. Colonic diverticulosis present. Enteric tube tip in the proximal body of the stomach. Vascular/Lymphatic: No significant vascular findings are present. No enlarged abdominal or pelvic lymph nodes. Reproductive: Uterus and bilateral adnexa are unremarkable. Other: No abdominal wall hernia or abnormality. There is trace free fluid in the left upper quadrant. Musculoskeletal: No acute or significant osseous findings. IMPRESSION: 1. Extensive bilateral lower lobe consolidation with  patchy and ill-defined nodular ground-glass opacities throughout the remainder of the lungs. Findings are suspicious for multifocal pneumonia and/or aspiration. 2. Small right pleural effusion. 3. There is some cutoff of segmental and subsegmental branches of the right lower lobe. Findings may be related to mucous plugging or aspiration. 4. Questionable mild inflammatory stranding surrounding the pancreas. Correlate clinically for pancreatitis. 5. Trace free fluid in the left upper quadrant. 6. Acute right third through seventh rib fractures. No pneumothorax. Electronically Signed   By: Ronney Asters M.D.   On: 05/31/2022 19:33   CT Head Wo Contrast  Result Date: 05/31/2022 CLINICAL DATA:  Status post cardiac arrest. EXAM: CT HEAD WITHOUT CONTRAST TECHNIQUE: Contiguous axial images were obtained from the base of the skull through the vertex without intravenous contrast. RADIATION DOSE REDUCTION: This exam was performed according to the departmental dose-optimization program which includes automated exposure control, adjustment of the mA and/or kV according to patient size and/or use of iterative reconstruction technique. 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 There is likely also an infarct in the anterior left temporal lobe (series 104, image 32). There is also diffuse sulcal effacement, crowding of the foramen magnum, and effacement of the basal cisterns which is worrisome for cerebral edema. Vascular: No hyperdense vessel or unexpected calcification. Skull: Normal. Negative for fracture or focal lesion. Sinuses/Orbits: Air-fluid level in the right11 maxillary sinus. Other: None. 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 There is also diffuse sulcal effacement, crowding of the foramen magnum, and effacement of the  basal cisterns which is worrisome for cerebral edema and hypoxic ischemic injury. Recommend brain MRI for further evaluation Electronically Signed   By: Marin Roberts M.D.   On: 05/31/2022 18:23   DG Abd 1 View  Result Date: 05/31/2022 CLINICAL DATA:  OG tube placement. EXAM: ABDOMEN - 1 VIEW COMPARISON:  One view chest x-ray 05/31/2022 at 2:56 p.m. FINDINGS: Side port of the OG tube is in the stomach. The stomach is decompressed. Bowel gas pattern is otherwise unremarkable. IMPRESSION: Side port of the OG tube is in the stomach. Electronically Signed   By: San Morelle M.D.   On: 05/31/2022 15:45   DG Chest Port 1 View  Result Date: 05/31/2022 CLINICAL DATA:  tube EXAM: PORTABLE CHEST 1 VIEW COMPARISON:  May 21, 2022 FINDINGS: Evaluation is  limited by positioning. The cardiomediastinal silhouette is unchanged in contour.ETT tip terminates 3 cm above the carina. No pleural effusion. No pneumothorax. New hazy LEFT greater than RIGHT perihilar predominant opacities. Gaseous distension of the stomach. Surgical clips project over the RIGHT thorax. IMPRESSION: 1. ETT tip terminates 3 cm above the carina. 2. New LEFT greater than RIGHT perihilar predominant opacities. Differential considerations include pulmonary edema, aspiration or infection. Electronically Signed   By: Valentino Saxon M.D.   On: 05/31/2022 15:24   CT CHEST W CONTRAST  Result Date: 05/24/2022 CLINICAL DATA:  Occult malignancy malignancy screen, prior hx of breast cancer in remission but has acute onset confusion, aphasia and motor apraxia. High concern for a paraneoplastic process. EXAM: CT CHEST WITH CONTRAST TECHNIQUE: Multidetector CT imaging of the chest was performed during intravenous contrast administration. RADIATION DOSE REDUCTION: This exam was performed according to the departmental dose-optimization program which includes automated exposure control, adjustment of the mA and/or kV according to patient size and/or  use of iterative reconstruction technique. CONTRAST:  45m OMNIPAQUE IOHEXOL 350 MG/ML SOLN COMPARISON:  05/21/2022, 05/22/2022, 09/09/2021 FINDINGS: Cardiovascular: The heart is unremarkable without pericardial effusion. No evidence of thoracic aortic aneurysm or dissection. Mediastinum/Nodes: No enlarged mediastinal, hilar, or axillary lymph nodes. Thyroid gland, trachea, and esophagus demonstrate no significant findings. Lungs/Pleura: Patchy left lower lobe airspace disease is again identified within the posterior costophrenic angle, most compatible with pneumonia. No effusion or pneumothorax. The central airways are patent. Upper Abdomen: No acute abnormality. Musculoskeletal: Postsurgical changes from bilateral mastectomies with breast prostheses in place. There are no acute or destructive bony lesions. Reconstructed images demonstrate no additional findings. IMPRESSION: 1. Patchy left lower lobe airspace disease, unchanged since previous abdominal CT, most consistent with focal pneumonia. Followup PA and lateral chest X-ray is recommended in 3-4 weeks following trial of antibiotic therapy to ensure resolution and exclude underlying malignancy. 2. Otherwise no acute intrathoracic process. 3. Postsurgical changes from bilateral mastectomies and breast reconstructive surgery. Electronically Signed   By: MRanda NgoM.D.   On: 05/24/2022 21:19   DG FL GUIDED LUMBAR PUNCTURE  Result Date: 05/23/2022 CLINICAL DATA:  46year old female history of breast cancer. [facility]. Patient was discharged and readmitted with confusion and aphasia. Team is requesting lumbar puncture for further evaluation. EXAM: DIAGNOSTIC LUMBAR PUNCTURE UNDER FLUOROSCOPIC GUIDANCE COMPARISON:  None Available. FLUOROSCOPY: Radiation Exposure Index (as provided by the fluoroscopic device): 11 mGy Kerma PROCEDURE: Informed consent was obtained from the patient prior to the  procedure, including potential complications of headache, allergy, and pain. With the patient prone, the lower back was prepped with Betadine. 1% Lidocaine was used for local anesthesia. Lumbar puncture was performed at the L4-5 level using a 20 gauge 5 inch needle with return of clear CSF with an opening pressure of 15 cm water. 9 mL of CSF were obtained for laboratory studies. The patient tolerated the procedure well and there were no apparent complications. This exam was performed by JRushie Nyhan NP, and was supervised and interpreted by ALogan Bores MD. IMPRESSION: Technically successful fluoroscopically guided lumbar puncture. Electronically Signed   By: ALogan BoresM.D.   On: 05/23/2022 15:54   Overnight EEG with video  Result Date: 05/23/2022 YLora Havens MD     05/24/2022  9:45 AM Patient Name: ENeeka UristaMRN: [id number] Attending: PLora HavensReferring Physician/Provider: BLorenza Chick MD Duration: 05/23/2022 0453 to 05/24/2022 0453 Patient history: 46year old  female with acute onset bilateral upper and lower extremity weakness, confusion, hallucinations.  EEG to evaluate for seizure. Level of alertness: Awake, asleep AEDs during EEG study: None Technical aspects: This EEG study was done with scalp electrodes positioned according to the 10-20 International system of electrode placement. Electrical activity was reviewed with band pass filter of 1-'70Hz'$ , sensitivity of 7 uV/mm, display speed of 25m/sec with a '60Hz'$  notched filter applied as appropriate. EEG data were recorded continuously and digitally stored.  Video monitoring was available and reviewed as appropriate. Description: The posterior dominant rhythm consists of 8 Hz activity of moderate voltage (25-35 uV) seen predominantly in posterior head regions, symmetric and reactive to eye opening and eye closing. Sleep was characterized by vertex waves, sleep spindles (12 to 14 Hz), maximal frontocentral region. EEG  showed continuous generalized polymorphic 3 to 6 Hz theta-delta slowing. Hyperventilation and photic stimulation were not performed.   ABNORMALITY - Continuous slow, generalized IMPRESSION: This study is suggestive of moderate diffuse encephalopathy, nonspecific etiology. No seizures or epileptiform discharges were seen throughout the recording. PLora Havens  CT Head Wo Contrast  Result Date: 05/22/2022 CLINICAL DATA:  Dizziness, persistent/recurrent, cardiac or vascular cause suspected Hospital discharge today. EXAM: CT HEAD WITHOUT CONTRAST TECHNIQUE: Contiguous axial images were obtained from the base of the skull through the vertex without intravenous contrast. RADIATION DOSE REDUCTION: This exam was performed according to the departmental dose-optimization program which includes automated exposure control, adjustment of the mA and/or kV according to patient size and/or use of iterative reconstruction technique. COMPARISON:  Brain MRI 05/24/2021 FINDINGS: Brain: No evidence of acute infarction, hemorrhage, hydrocephalus, extra-axial collection or mass lesion/mass effect. Vascular: No hyperdense vessel or unexpected calcification. Skull: Normal. Negative for fracture or focal lesion. Sinuses/Orbits: Minimal mucosal thickening of scattered ethmoid air cells. No mastoid effusion. Unremarkable orbits. Other: None. IMPRESSION: No acute intracranial abnormality. Electronically Signed   By: MKeith RakeM.D.   On: 05/22/2022 23:18   CT ABDOMEN PELVIS W CONTRAST  Result Date: 05/22/2022 CLINICAL DATA:  Abdominal pain, nausea/vomiting EXAM: CT ABDOMEN AND PELVIS WITH CONTRAST TECHNIQUE: Multidetector CT imaging of the abdomen and pelvis was performed using the standard protocol following bolus administration of intravenous contrast. RADIATION DOSE REDUCTION: This exam was performed according to the departmental dose-optimization program which includes automated exposure control, adjustment of the mA  and/or kV according to patient size and/or use of iterative reconstruction technique. CONTRAST:  1014mOMNIPAQUE IOHEXOL 300 MG/ML  SOLN COMPARISON:  11/14/2020 FINDINGS: Lower chest: Focal patchy opacity the left lung base, suspicious for pneumonia. Hepatobiliary: Liver is within normal limits. Status post cholecystectomy. No intrahepatic or extrahepatic ductal dilatation. Pancreas: Within normal limits. Spleen: Within normal limits Adrenals/Urinary Tract: Adrenal glands are within normal limits. Kidneys are normal limits.  No hydronephrosis. Bladder is within normal limits. Stomach/Bowel: Stomach is within normal limits. No evidence of bowel obstruction. Normal appendix (series 2/image 1). No colonic wall thickening or inflammatory changes. Vascular/Lymphatic: No evidence of abdominal aortic aneurysm. No suspicious abdominopelvic lymphadenopathy. Reproductive: Uterus is within normal limits. No adnexal masses. Other: No abdominopelvic ascites. Musculoskeletal: Visualized osseous structures are within normal limits. IMPRESSION: Focal patchy opacity at the left lung base, suspicious for pneumonia. Otherwise negative CT abdomen/pelvis. Electronically Signed   By: SrJulian Hy.D.   On: 05/22/2022 02:58   DG Chest Port 1 View  Result Date: 05/21/2022 CLINICAL DATA:  Weakness EXAM: PORTABLE CHEST 1 VIEW COMPARISON:  09/09/2021 FINDINGS: Lungs are clear.  No pleural effusion  or pneumothorax. The heart is normal in size. IMPRESSION: No evidence of acute cardiopulmonary disease. Electronically Signed   By: Julian Hy M.D.   On: 05/21/2022 21:40    Microbiology Recent Results (from the past 240 hour(s))  SARS Coronavirus 2 by RT PCR (hospital order, performed in [facility] hospital lab) *cepheid single result test*     Status: None   Collection Time: 05/22/22  9:17 PM  Result Value Ref Range Status   SARS Coronavirus 2 by RT PCR NEGATIVE NEGATIVE Final    Comment: (NOTE) SARS-CoV-2 target nucleic  acids are NOT DETECTED.  The SARS-CoV-2 RNA is generally detectable in upper and lower respiratory specimens during the acute phase of infection. The lowest concentration of SARS-CoV-2 viral copies this assay can detect is 250 copies / mL. A negative result does not preclude SARS-CoV-2 infection and should not be used as the sole basis for treatment or other patient management decisions.  A negative result may occur with improper specimen collection / handling, submission of specimen other than nasopharyngeal swab, presence of viral mutation(s) within the areas targeted by this assay, and inadequate number of viral copies (<250 copies / mL). A negative result must be combined with clinical observations, patient history, and epidemiological information.  Fact Sheet for Patients:   https://www.patel.info/  Fact Sheet for Healthcare Providers: https://hall.com/  This test is not yet approved or  cleared by the [place] FDA and has been authorized for detection and/or diagnosis of SARS-CoV-2 by FDA under an Emergency Use Authorization (EUA).  This EUA will remain in effect (meaning this test can be used) for the duration of the COVID-19 declaration under Section 564(b)(1) of the Act, 21 U.S.C. section 360bbb-3(b)(1), unless the authorization is terminated or revoked sooner.  Performed at [facility], [place] [address], [place], [place] [address]   CSF culture w Gram Stain     Status: None   Collection Time: 05/23/22  2:48 PM   Specimen: PATH Cytology CSF; Cerebrospinal Fluid  Result Value Ref Range Status   Specimen Description CSF  Final   Special Requests NONE  Final   Gram Stain   Final    WBC PRESENT, PREDOMINANTLY PMN NO ORGANISMS SEEN CYTOSPIN SMEAR    Culture   Final    NO GROWTH Performed at [facility], [place] [address] [place], [place] [address]    Report Status 05/26/2022 FINAL  Final  SARS Coronavirus 2 by  RT PCR (hospital order, performed in [facility] hospital lab) *cepheid single result test* Anterior Nasal Swab     Status: None   Collection Time: 05/31/22  2:31 AM   Specimen: Anterior Nasal Swab  Result Value Ref Range Status   SARS Coronavirus 2 by RT PCR NEGATIVE NEGATIVE Final    Comment: (NOTE) SARS-CoV-2 target nucleic acids are NOT DETECTED.  The SARS-CoV-2 RNA is generally detectable in upper and lower respiratory specimens during the acute phase of infection. The lowest concentration of SARS-CoV-2 viral copies this assay can detect is 250 copies / mL. A negative result does not preclude SARS-CoV-2 infection and should not be used as the sole basis for treatment or other patient management decisions.  A negative result may occur with improper specimen collection / handling, submission of specimen other than nasopharyngeal swab, presence of viral mutation(s) within the areas targeted by this assay, and inadequate number of viral copies (<250 copies / mL). A negative result must be combined with clinical observations, patient history,  and epidemiological information.  Fact Sheet for Patients:   https://www.patel.info/  Fact Sheet for Healthcare Providers: https://hall.com/  This test is not yet approved or  cleared by the [place] FDA and has been authorized for detection and/or diagnosis of SARS-CoV-2 by FDA under an Emergency Use Authorization (EUA).  This EUA will remain in effect (meaning this test can be used) for the duration of the COVID-19 declaration under Section 564(b)(1) of the Act, 21 U.S.C. section 360bbb-3(b)(1), unless the authorization is terminated or revoked sooner.  Performed at [facility], [place] [address] [place], [place] [address]   MRSA Next Gen by PCR, Nasal     Status: None   Collection Time: 05/31/22  8:38 PM   Specimen: Nasal Mucosa; Nasal Swab  Result Value Ref Range Status    MRSA by PCR Next Gen NOT DETECTED NOT DETECTED Final    Comment: (NOTE) The GeneXpert MRSA Assay (FDA approved for NASAL specimens only), is one component of a comprehensive MRSA colonization surveillance program. It is not intended to diagnose MRSA infection nor to guide or monitor treatment for MRSA infections. Test performance is not FDA approved in patients less than 56 years old. Performed at [facility], [place] [address], [place], [place] [address]     Lab Basic Metabolic Panel: Recent Labs  Lab 05/20/2022 1950 05/19/2022 2006 05/31/22 1455 05/31/22 1457 05/31/22 1553 05/31/22 2030 07-01-22 0315  NA 135  --  134* 141 135 125* 130*  K 2.9*  --  3.9 <2.0* 3.2* 2.7* 3.6  CL 100  --  97* 126* 102 93* 98  CO2 26  --   --  12* 21* 18* 19*  GLUCOSE 117*  --  217* 118* 266* 659* 425*  BUN 13  --  8 5* '10 13 17  '$ CREATININE 0.53  --  0.60 <0.30* 0.90 0.98 1.07*  CALCIUM 8.6*  --   --  <4.0* 7.4* 6.7* 8.0*  MG  --  1.8  --   --   --   --   --    Liver Function Tests: Recent Labs  Lab 06/07/2022 1950 05/31/22 1553 2022/07/01 0315  AST 20 813* 1,028*  ALT 31 758* 831*  ALKPHOS 44 76 54  BILITOT 0.6 0.7 1.0  PROT 7.2 4.6* 6.2*  ALBUMIN 3.4* 2.0* 2.8*   No results for input(s): "LIPASE", "AMYLASE" in the last 168 hours. No results for input(s): "AMMONIA" in the last 168 hours. CBC: Recent Labs  Lab 05/17/2022 1950 05/31/22 1455 05/31/22 1457 05/31/22 1553 05/31/22 2030 July 01, 2022 0053  WBC 8.8  --  3.2* 12.5* 10.2 13.2*  NEUTROABS 6.0  --  1.3* DUPLICATE SEE ACC [id number]  --   --   HGB 12.0 10.9* 5.3* 9.5* 12.9 15.3*  HCT 36.2 32.0* 17.7* 30.5* 39.7 45.6  MCV 88.1  --  99.4 94.4 91.7 89.1  PLT 221  --  102* 167 170 185   Cardiac Enzymes: No results for input(s): "CKTOTAL", "CKMB", "CKMBINDEX", "TROPONINI" in the last 168 hours. Sepsis Labs: Recent Labs  Lab 05/31/22 1457 05/31/22 1553 05/31/22 2030 2022/07/01 0053  WBC 3.2* 12.5* 10.2 13.2*     Procedures/Operations  11/18 intubation, right femoral central line placement, arterial line placement   Maryjane Hurter July 01, 2022, 7:26 PM

## 2022-06-13 NOTE — Progress Notes (Signed)
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 Developed migraine during her eval. Had potassium administered. Later during medical evauation she had sudden weakness, urinated on herself. An hour or so later was found unresponsive, pulseless and ACLS started with ROSC after 18 min (presenting PEA, no shocks during arrest), had another brief arrest with ROSC after 1-2 rounds CPR. Remains unresponsive post arrest. Post arrest EKG concerning initially for ischemia and Hb of 5 for which 2U pRBC ordered. She was transferred to ICU on epi 15, levo 5, vaso.  Pertinent  Medical History  Stage IIIc breast cancer Adrenal insufficiency Recent admission for ?sterile meningitis vs paraneoplastic or autoimmune meningitis  Significant Hospital Events: Including procedures, antibiotic start and stop dates in addition to other pertinent events   11/18 cardiac arrest with unclear precipitant   Interim History / Subjective:  Downward central herniation on CT   Objective   Blood pressure (!) 56/35, pulse (!) 137, temperature (!) 95.9 F (35.5 C), resp. rate (!) 0, height '5\' 4"'$  (1.626 m), weight 98 kg, last menstrual period 10/26/2020, SpO2 99 %.    Vent Mode: PRVC FiO2 (%):  [75 %-100 %] 75 % Set Rate:  [18 bmp-28 bmp] 28 bmp Vt Set:  [430 mL] 430 mL PEEP:  [5 cmH20-10 cmH20] 10 cmH20 Plateau Pressure:  [20 cmH20-26 cmH20] 26 cmH20   Intake/Output Summary (Last  24 hours) at 06/29/2022 0725 Last data filed at 29-Jun-2022 0700 Gross per 24 hour  Intake 6153.83 ml  Output 650 ml  Net 5503.83 ml   Filed Weights   06/06/2022 2037  Weight: 98 kg    Examination: General appearance: 46 y.o., female, unresponsive, female, unresponsive Eyes: dilated not reactive Lungs: ctab, equal chest rise, not triggering CV: tachy RR, no murmur  Abdomen: Soft, non-tender; non-distended, BS absent Extremities: no peripheral edema, warm Neuro: unresponsive to noxious stimulation  Labs/imaging reviewed:  CTA head/neck, venogram - downward central herniation, no intracranial perfusion on arterial phase   Trop 2000 -> 1782 AST/ALT elevated BUN/S Cr stable  Resolved Hospital Problem list     Assessment & Plan:   # post arrest care: ?infarcts from central venous thrombosis, wonder if had related seizure activity or simply evolving infarct and aspiration leading to arrest. 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 # Anoxic brain injury # Brain compression from anoxic brain injury and multifocal infarcts  - full vent support   # Acute hypoxic respiratory failure # Aspiration pneumonia - full vent support until family arrives and transitions to comfort measures  # Shock May simply be vasoplegic post arrest - vanc/merrem, follow cultures and narrow as able - stress dose steroids  # Metabolic acidosis - bicarb gtt for now  # Possible acute blood loss anemia - stop trending labs  # Transaminitis Likely shock liver - stop trending labs  # Adrenal insufficiency - stress dose steroids as above   Best Practice (right click and "Reselect all  SmartList Selections" daily)   Diet/type: NPO DVT prophylaxis: SCD GI prophylaxis: PPI Lines: Central line Foley:  Yes, and it is still needed Code Status:  DNR - transition to comfort measures after family arrives Last date  of multidisciplinary goals of care discussion [family updated at bedside 11/18 including pt's mother and father]  Critical care time: 32 minutes

## 2022-06-13 NOTE — Procedures (Signed)
Extubation Procedure Note  Patient Details:   Name: Jimesha Rising DOB: 1976-03-15 MRN: [id number]   Airway Documentation:    Vent end date: 06-25-2022 Vent end time: 1225   Evaluation  O2 sats: stable throughout Complications: No apparent complications Patient did tolerate procedure well. Bilateral Breath Sounds: Clear, Diminished, Coarse crackles   No  Baldwin [place] Nannette 06/25/22, 12:29 PM  Placed on 2 LPM post extubation- RN aware.

## 2022-06-13 NOTE — Progress Notes (Signed)
Patient transferred to CT without any issues

## 2022-06-13 NOTE — Progress Notes (Signed)
  Echocardiogram 2D Echocardiogram has been performed.  Amber White 20-Jun-2022, 8:42 AM

## 2022-06-13 NOTE — Progress Notes (Signed)
[facility] Progress Note Patient Name: Amber White DOB: October 29, 1975 MRN: [id number]   Date of Service  Jun 07, 2022  HPI/Events of Note  Glucose persistently elevated despite sliding scale insulin  eICU Interventions  Will start on IV insulin for persistent hyperglycemia.  Will adjust infusion per endotool.         [place] 06-07-2022, 4:20 AM

## 2022-06-13 NOTE — Progress Notes (Signed)
Called to PT room for arterial line malfunction. RT added air to pressure back. Arterial line has good waveform, draws and flushes blood. Current blood pressure reading via arterial line at this time is 127/105. RN aware.

## 2022-06-13 NOTE — Progress Notes (Addendum)
Arterial line has good waveform, draws and flushes blood, site within normal limits at this time. IV line is labeled.

## 2022-06-13 NOTE — Procedures (Signed)
Patient Name: Amber White  MRN: [id number]  Epilepsy Attending: Lora Havens  Referring Physician/Provider: Maryjane Hurter, MD  Duration: 05/31/2022 2300 to 06/18/2022 7416  Patient history: 46yo F s/p cardiac arrest. EEG to evaluate for seizure.  Level of alertness: comatose  AEDs during EEG study: None  Technical aspects: This EEG was obtained using a 10 lead EEG system positioned circumferentially without any parasagittal coverage (rapid EEG). Computer selected EEG is reviewed as  well as background features and all clinically significant events.  Description: EEG showed continuous generalized background attenuation, not reactive to stimulation. Hyperventilation and photic stimulation were not performed.     ABNORMALITY -Background attenuation, generalized  IMPRESSION: This limited ceribell EEG is suggestive of profound diffuse encephalopathy, nonspecific etiology. No seizures or epileptiform discharges were seen throughout the recording.  Schwanda Zima Barbra Sarks

## 2022-06-13 DEATH — deceased

## 2022-06-18 LAB — MISC LABCORP TEST (SEND OUT): Labcorp test code: 9985

## 2022-06-27 ENCOUNTER — Ambulatory Visit (HOSPITAL_BASED_OUTPATIENT_CLINIC_OR_DEPARTMENT_OTHER): Admit: 2022-06-27 | Payer: No Typology Code available for payment source | Admitting: Plastic Surgery

## 2022-06-27 SURGERY — REMOVAL, TISSUE EXPANDER, BREAST, BILATERAL, WITH BILATERAL IMPLANT IMPLANT INSERTION
Anesthesia: General | Site: Chest | Laterality: Bilateral

## 2022-07-01 ENCOUNTER — Other Ambulatory Visit: Payer: Self-pay | Admitting: Hematology and Oncology

## 2022-07-16 ENCOUNTER — Ambulatory Visit: Payer: No Typology Code available for payment source | Admitting: Hematology and Oncology

## 2022-07-16 ENCOUNTER — Other Ambulatory Visit: Payer: No Typology Code available for payment source

## 2022-10-04 IMAGING — MR MR HEAD WO/W CM
18 series · 48 of 48 positions shown · IV contrast (gadavist)
Comparison: None.

CLINICAL DATA: Dizziness, nonspecific, history of breast cancer,
pituitary protocol per ordering provider

EXAM:
MRI HEAD WITHOUT AND WITH CONTRAST
TECHNIQUE: Multiplanar, multiecho pulse sequences of the brain and surrounding
structures were obtained without and with intravenous contrast.
CONTRAST:  10mL GADAVIST GADOBUTROL 1 MMOL/ML IV SOLN

[Series 5: DWI · axial · 3.0mm · 1.36mm/px · z∈[-54,+86]mm · 4 of 95 slices shown (1 of 2)]
[im 1/95]
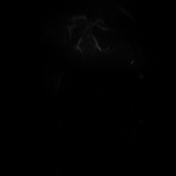
[im 32/95]
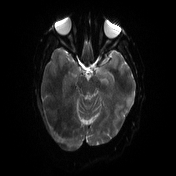
[im 63/95]
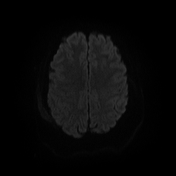
[im 95/95]
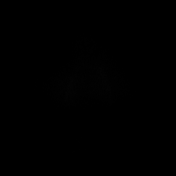

[Series 6: DWI · axial · 3.0mm · 1.36mm/px · z∈[-54,+86]mm · 2 of 48 slices shown (2 of 2)]
[im 1/48]
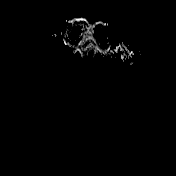
[im 48/48]
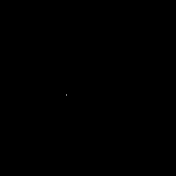

[Series 7: T1 · sagittal · 5.0mm · 0.75mm/px · 1 of 24 slices shown (1 of 7)]
[im 1/24]
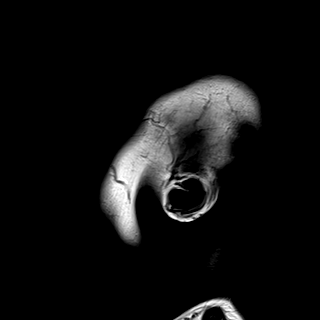

[Series 8: T2 · axial · 5.0mm · 0.62mm/px · 1 of 24 slices shown (1 of 2)]
[im 1/24]
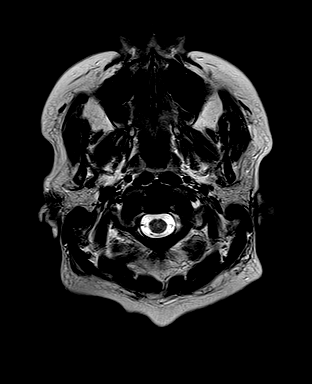

[Series 9: swi_images · axial · 3.0mm · 0.75mm/px · z∈[-77,+136]mm · 3 of 72 slices shown]
[im 1/72]
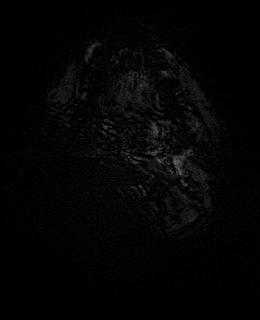
[im 36/72]
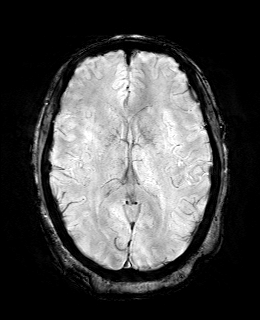
[im 72/72]
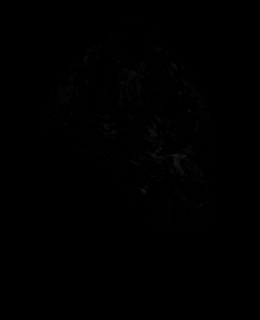

[Series 11: FLAIR · axial · 3.0mm · 0.75mm/px · z∈[-47,+106]mm · 3 of 52 slices shown]
[im 1/52]
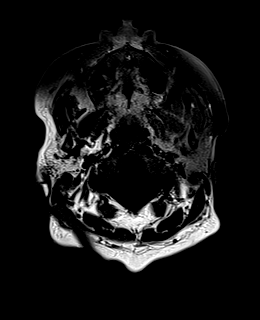
[im 26/52]
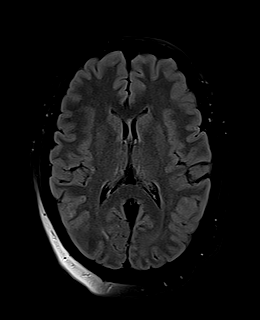
[im 52/52]
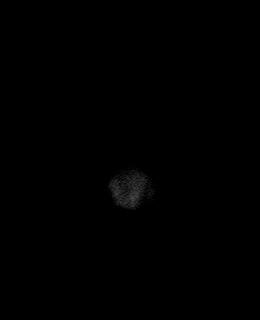

[Series 12: T1 · axial · 1.0mm · 0.94mm/px · z∈[-56,+86]mm · 8 of 144 slices shown (2 of 7)]
[im 1/144]
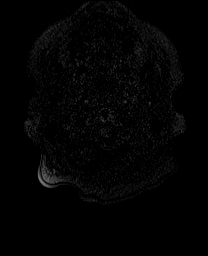
[im 21/144]
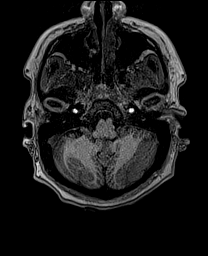
[im 41/144]
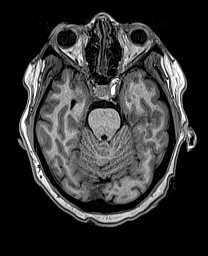
[im 62/144]
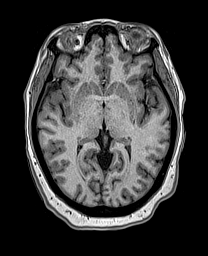
[im 82/144]
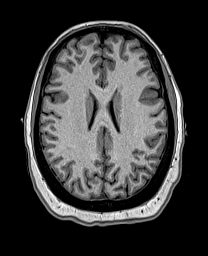
[im 103/144]
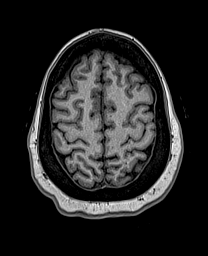
[im 123/144]
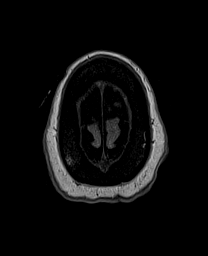
[im 144/144]
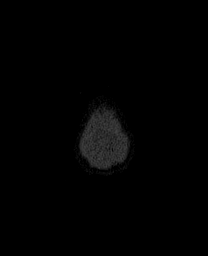

[Series 13: cor dwi_tracew · coronal · 5.0mm · 1.53mm/px · 3 of 60 slices shown]
[im 1/60]
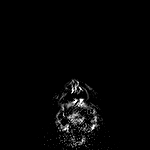
[im 30/60]
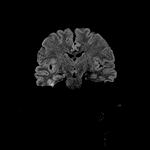
[im 60/60]
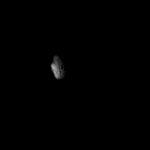

[Series 14: cor dwi_adc · coronal · 5.0mm · 1.53mm/px · 2 of 29 slices shown]
[im 1/29]
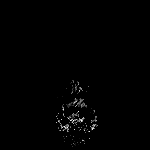
[im 29/29]
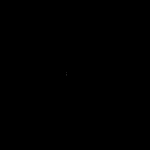

[Series 15: T1 · sagittal · 3.0mm · 0.35mm/px · 1 of 11 slices shown (3 of 7)]
[im 1/11]
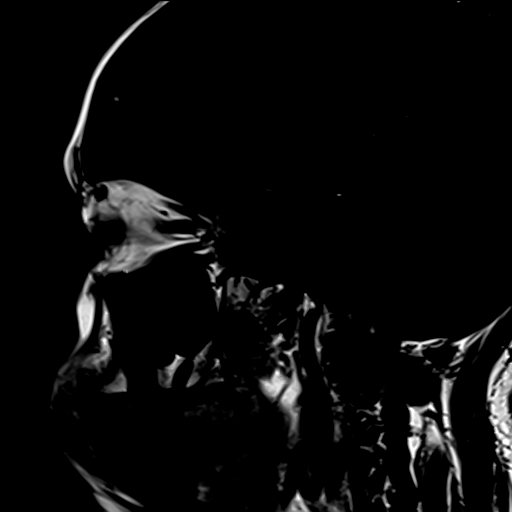

[Series 16: T2 · coronal · 3.0mm · 0.42mm/px · 1 of 11 slices shown (2 of 2)]
[im 1/11]
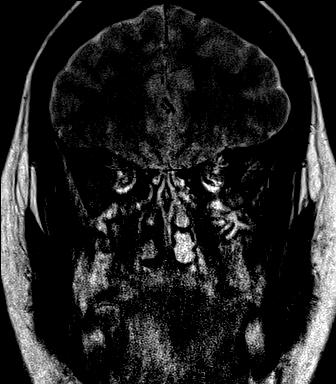

[Series 17: T1 · coronal · non-contrast · 3.0mm · 0.31mm/px · 1 of 15 slices shown (4 of 7)]
[im 1/15]
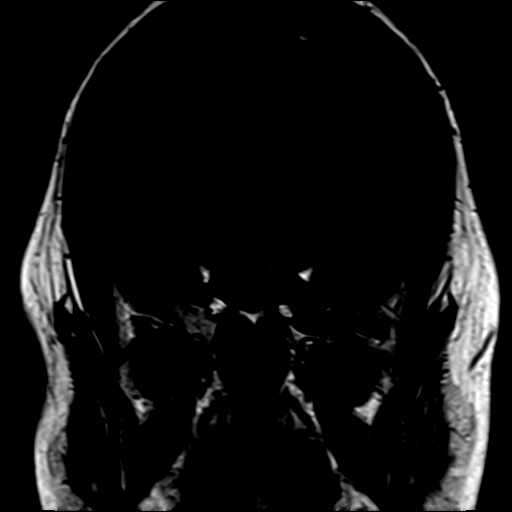

[Series 18: T1 · coronal · 3.0mm · 0.42mm/px · 4 of 77 slices shown (5 of 7)]
[im 1/77]
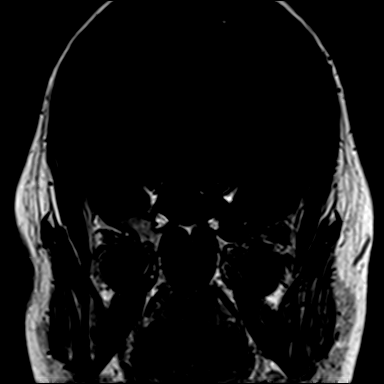
[im 26/77]
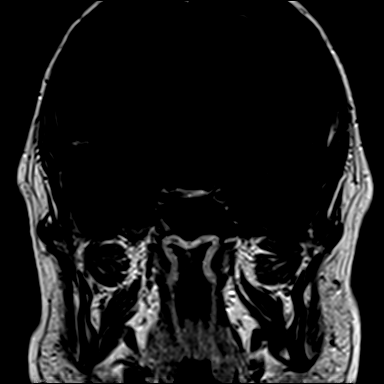
[im 51/77]
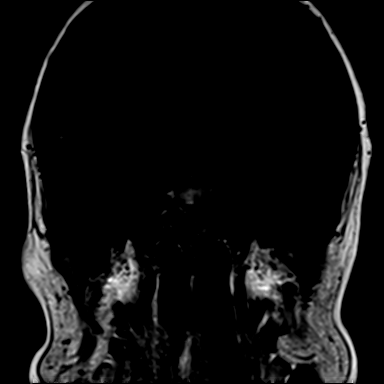
[im 77/77]
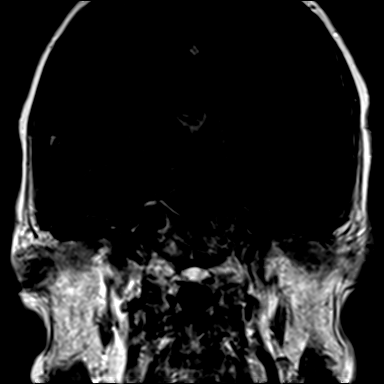

[Series 19: T1 post-contrast · coronal · 3.0mm · 0.31mm/px · 1 of 15 slices shown (1 of 3)]
[im 1/15]
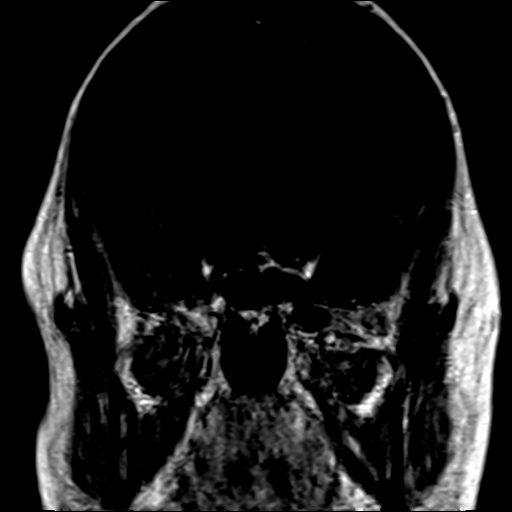

[Series 20: T1 post-contrast · sagittal · 3.0mm · 0.35mm/px · 1 of 11 slices shown (2 of 3)]
[im 1/11]
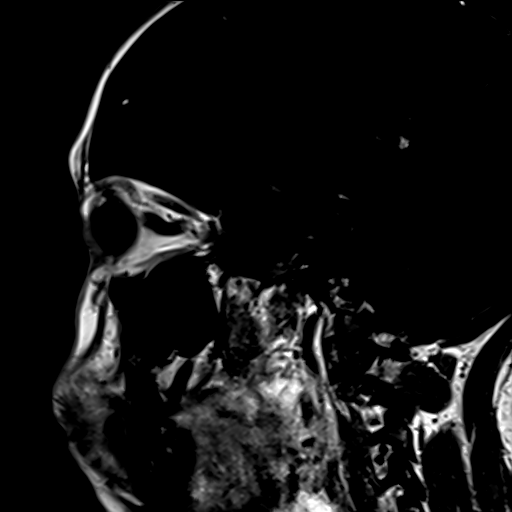

[Series 21: T1 post-contrast · axial · 1.0mm · 0.94mm/px · z∈[-56,+86]mm · 8 of 144 slices shown (3 of 3)]
[im 1/144]
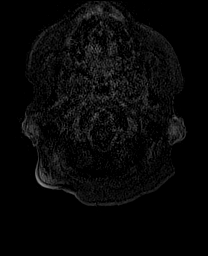
[im 21/144]
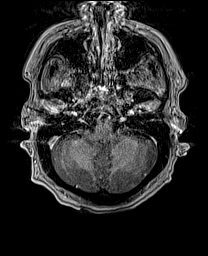
[im 41/144]
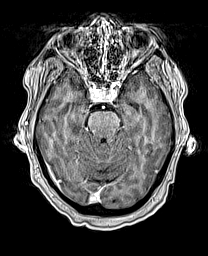
[im 62/144]
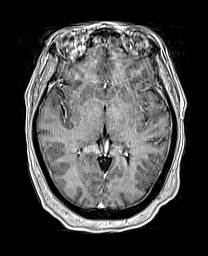
[im 82/144]
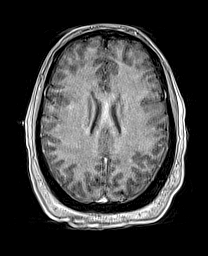
[im 103/144]
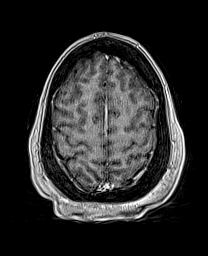
[im 123/144]
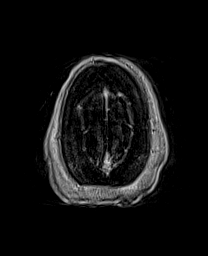
[im 144/144]
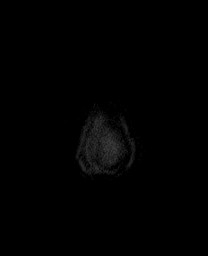

[Series 22: T1 · sagittal · 4.0mm · 0.94mm/px · 2 of 32 slices shown (6 of 7)]
[im 1/32]
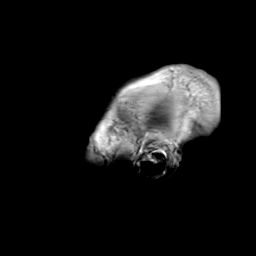
[im 32/32]
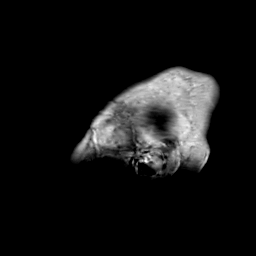

[Series 23: T1 · coronal · 4.0mm · 0.94mm/px · 2 of 30 slices shown (7 of 7)]
[im 1/30]
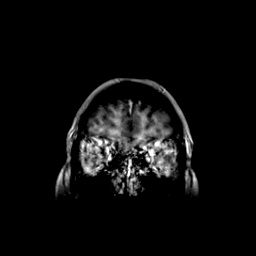
[im 30/30]
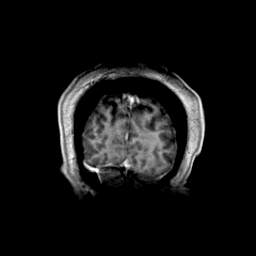

[48 of 48 positions shown; findings below may reference images not displayed]

FINDINGS: Evaluation is somewhat limited by motion artifact several sequences.

Brain: The pituitary gland and sella are normal in appearance on
precontrast imaging, with a normal pituitary bright spot.
Postcontrast imaging is somewhat limited by motion; however, within
this limitation no focal mass is seen within the sella or pituitary
gland. The infundibulum is midline and normal in appearance. The
optic chiasm is unremarkable.

No acute infarction, hemorrhage, mass, mass effect, or midline
shift. No abnormal enhancement. Insert normal note is made of a
small arachnoid cyst at the medial aspect of the left hippocampus,
without signal Aysn adjacent parenchyma. No hydrocephalus or
extra-axial collection

Vascular: Normal flow voids.

Skull and upper cervical spine: Small T1 and T2 hyperintense lesion
in the right parietal calvarium, consistent with a benign
hemangioma. Otherwise normal marrow signal.

Sinuses/Orbits: Negative.

Other: The mastoids are well aerated.
IMPRESSION: 1. Evaluation of the pituitary is somewhat limited by motion;
however, within this limitation, no focal masses seen within the
sella or pituitary gland.
2. No acute intracranial process.

## 2023-05-11 ENCOUNTER — Ambulatory Visit: Payer: No Typology Code available for payment source | Admitting: Hematology and Oncology

## 2023-05-11 ENCOUNTER — Other Ambulatory Visit: Payer: No Typology Code available for payment source
